# Patient Record
Sex: Male | Born: 1940 | Race: White | Hispanic: No | Marital: Married | Smoking: Former smoker
Health system: Southern US, Community
[De-identification: ages and names within clinical notes are randomized; demographics above are authoritative.]

## PROBLEM LIST (undated history)

## (undated) DIAGNOSIS — R911 Solitary pulmonary nodule: Secondary | ICD-10-CM

## (undated) DIAGNOSIS — E785 Hyperlipidemia, unspecified: Secondary | ICD-10-CM

## (undated) DIAGNOSIS — C801 Malignant (primary) neoplasm, unspecified: Secondary | ICD-10-CM

## (undated) DIAGNOSIS — I255 Ischemic cardiomyopathy: Secondary | ICD-10-CM

## (undated) DIAGNOSIS — I10 Essential (primary) hypertension: Secondary | ICD-10-CM

## (undated) DIAGNOSIS — D689 Coagulation defect, unspecified: Secondary | ICD-10-CM

## (undated) DIAGNOSIS — G709 Myoneural disorder, unspecified: Secondary | ICD-10-CM

## (undated) DIAGNOSIS — I2510 Atherosclerotic heart disease of native coronary artery without angina pectoris: Secondary | ICD-10-CM

## (undated) DIAGNOSIS — M3130 Wegener's granulomatosis without renal involvement: Secondary | ICD-10-CM

## (undated) HISTORY — DX: Coagulation defect, unspecified: D68.9

## (undated) HISTORY — DX: Essential (primary) hypertension: I10

## (undated) HISTORY — PX: BASAL CELL CARCINOMA EXCISION: SHX1214

## (undated) HISTORY — DX: Hyperlipidemia, unspecified: E78.5

## (undated) HISTORY — DX: Solitary pulmonary nodule: R91.1

## (undated) HISTORY — PX: POLYPECTOMY: SHX149

## (undated) HISTORY — DX: Wegener's granulomatosis without renal involvement: M31.30

## (undated) HISTORY — PX: LUNG SURGERY: SHX703

## (undated) HISTORY — DX: Ischemic cardiomyopathy: I25.5

## (undated) HISTORY — PX: COLONOSCOPY: SHX174

## (undated) HISTORY — DX: Atherosclerotic heart disease of native coronary artery without angina pectoris: I25.10

## (undated) HISTORY — DX: Malignant (primary) neoplasm, unspecified: C80.1

## (undated) HISTORY — PX: CARDIAC CATHETERIZATION: SHX172

## (undated) HISTORY — DX: Myoneural disorder, unspecified: G70.9

## (undated) HISTORY — DX: Morbid (severe) obesity due to excess calories: E66.01

---

## 1998-05-30 HISTORY — PX: CARDIAC SURGERY: SHX584

## 1998-09-22 HISTORY — PX: CORONARY ANGIOPLASTY: SHX604

## 2005-05-30 DIAGNOSIS — M3130 Wegener's granulomatosis without renal involvement: Secondary | ICD-10-CM

## 2005-05-30 HISTORY — DX: Wegener's granulomatosis without renal involvement: M31.30

## 2008-02-13 DIAGNOSIS — I10 Essential (primary) hypertension: Secondary | ICD-10-CM | POA: Insufficient documentation

## 2008-02-13 DIAGNOSIS — E7800 Pure hypercholesterolemia, unspecified: Secondary | ICD-10-CM | POA: Insufficient documentation

## 2010-08-30 ENCOUNTER — Ambulatory Visit (INDEPENDENT_AMBULATORY_CARE_PROVIDER_SITE_OTHER): Payer: Medicare Other | Admitting: Family Medicine

## 2010-08-30 ENCOUNTER — Encounter: Payer: Self-pay | Admitting: Family Medicine

## 2010-08-30 DIAGNOSIS — E11319 Type 2 diabetes mellitus with unspecified diabetic retinopathy without macular edema: Secondary | ICD-10-CM | POA: Insufficient documentation

## 2010-08-30 DIAGNOSIS — M3130 Wegener's granulomatosis without renal involvement: Secondary | ICD-10-CM | POA: Insufficient documentation

## 2010-08-30 DIAGNOSIS — E1142 Type 2 diabetes mellitus with diabetic polyneuropathy: Secondary | ICD-10-CM | POA: Insufficient documentation

## 2010-08-30 DIAGNOSIS — E785 Hyperlipidemia, unspecified: Secondary | ICD-10-CM

## 2010-08-30 DIAGNOSIS — I10 Essential (primary) hypertension: Secondary | ICD-10-CM

## 2010-08-30 DIAGNOSIS — E119 Type 2 diabetes mellitus without complications: Secondary | ICD-10-CM

## 2010-08-30 DIAGNOSIS — E1169 Type 2 diabetes mellitus with other specified complication: Secondary | ICD-10-CM | POA: Insufficient documentation

## 2010-08-30 MED ORDER — AMLODIPINE BESYLATE 10 MG PO TABS: 10.0000 mg | ORAL_TABLET | Freq: Every day | ORAL | Status: DC

## 2010-08-30 NOTE — Assessment & Plan Note (Signed)
Fairly controlled - pt will check to see if home cuff is calibrated and disc with cardiol tomorrow  Disc lifestyle Enc to keep loosing wt  Lab 6 wk F/u 8 wk

## 2010-08-30 NOTE — Assessment & Plan Note (Signed)
Is asymptomatic now  Primary symptoms - ETD when he gets it  Ref to pulm for long term care

## 2010-08-30 NOTE — Assessment & Plan Note (Signed)
Per pt is from prior prednisone and improving Also loosing wt Disc low glycemic diet utd opthy  Lab 6 wk and f/u

## 2010-08-30 NOTE — Progress Notes (Signed)
Subjective:    Patient ID: Ryan Morgan, male    DOB: 1940-07-27, 70 y.o.   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  Lipids - on meds -- has to change simvastatin because of amlodipine  Good chol a bit low at last check , total 140 , ? What the LDL was  Primary care does that check  Seeing cardiol tomorrow   Last heatth mt exam was 1 year ago   CAD- in 2000 -- one stent , other vessels clear -- doing well ever since   Hx of basal cell lesion --behind ear  Very early stage - also off nose  Is good about using sunscreen Sees Dr Orson Aloe at least yearly Wegoners- causes red bumps -- and uses mometasone cream  Itchy dry skin on legs - took fluocinonide   Temovate for insect bites prn   Lotrimin for jock itch   Nasal and opthy allergies More cough non prod lately   Extensive dental surgery lately --implants      Wegoner's granulomatosis  Was dx 1997 -- off prednisone for " a while" Symptoms include fluid in ear -- takes 10 mg prednisone prn  Needs to get est with pulm dr  Needs labs Non symptomatic  When symptomatic - ETD and also joint pain, fluid build up in lungs   Had lung bx in the past   Past Medical History  Diagnosis Date  . Diabetes mellitus   . Hypertension   . Heart disease   . Hyperlipidemia    Past Surgical History  Procedure Date  . Basal cell carcinoma excision     removed from face x  3  . 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 Cardiac surgery 2000    stent in heart    reports that he quit smoking about 32 years ago. He does not have any smokeless tobacco history on file. His alcohol and drug histories not on file. family history includes Cancer in his mother and Stroke in his maternal grandfather. Allergies  Allergen Reactions  . Niaspan (Niacin (Antihyperlipidemic)) Other (See Comments)    flushing  . Septra (Bactrim) Hives        Review of Systems  Constitutional: Negative for fever, appetite change, fatigue and unexpected weight change.  HENT: Negative for congestion and sore throat.   Eyes: Negative for pain and visual disturbance.  Respiratory: Negative for cough, shortness of breath and stridor.   Cardiovascular: Negative.   Gastrointestinal: Negative for nausea, vomiting and abdominal distention.  Genitourinary: Negative for urgency and frequency.  Musculoskeletal: Negative for myalgias and joint swelling.  Skin: Negative for color change, pallor and rash.  Neurological: Negative for tremors, weakness, light-headedness and headaches.  Hematological: Negative for adenopathy. Does not bruise/bleed easily.  Psychiatric/Behavioral: Negative  for dysphoric mood. The patient is not nervous/anxious.         Objective:   Physical Exam  Constitutional: He appears well-developed and well-nourished.       overwt and well appearing   HENT:  Head: Normocephalic and atraumatic.  Right Ear: External ear normal.  Left Ear: External ear normal.  Mouth/Throat: Oropharynx is clear and moist.  Eyes: Conjunctivae and EOM are normal. Pupils are equal, round, and reactive to light.  Neck: Normal range of motion. Neck supple. No JVD present. Carotid bruit is not present. No thyromegaly present.  Cardiovascular: Normal rate, regular rhythm and normal heart sounds.   Pulmonary/Chest: Effort normal and breath sounds normal. No respiratory  distress. He has no rales. He exhibits no tenderness.  Abdominal: Soft. Bowel sounds are normal. He exhibits no distension and no mass. There is no rebound.  Musculoskeletal: Normal range of motion. He exhibits no edema and no tenderness.  Lymphadenopathy:    He has no cervical adenopathy.  Neurological: He is alert. He has normal reflexes. Coordination normal.  Skin: Skin is warm and dry. No pallor.  Psychiatric: He has a normal mood and affect.          Assessment & Plan:

## 2010-08-30 NOTE — Patient Instructions (Signed)
We will do referral for pulmonary at check out - ask to see Ryan Morgan  Schedule fasting labs in 6 weeks - at that time let us know what cholesterol med you are on  Keep working on loosing weight loss  Follow up with me in 8 weeks

## 2010-08-30 NOTE — Assessment & Plan Note (Signed)
Off simvastain in light of amlodipine  Will get new statin from cardiol tomorrow Lab 6wk and f/u 8 wk Disc low sat fat diet

## 2010-09-15 ENCOUNTER — Ambulatory Visit (INDEPENDENT_AMBULATORY_CARE_PROVIDER_SITE_OTHER)
Admission: RE | Admit: 2010-09-15 | Discharge: 2010-09-15 | Disposition: A | Discharge status: Home / Self Care | Payer: Medicare Other | Source: Ambulatory Visit | Attending: Internal Medicine | Admitting: Internal Medicine

## 2010-09-15 ENCOUNTER — Ambulatory Visit (INDEPENDENT_AMBULATORY_CARE_PROVIDER_SITE_OTHER): Payer: Medicare Other | Admitting: Internal Medicine

## 2010-09-15 ENCOUNTER — Other Ambulatory Visit: Payer: Self-pay | Admitting: Internal Medicine

## 2010-09-15 ENCOUNTER — Other Ambulatory Visit: Payer: Medicare Other

## 2010-09-15 ENCOUNTER — Encounter: Payer: Self-pay | Admitting: Internal Medicine

## 2010-09-15 DIAGNOSIS — M3130 Wegener's granulomatosis without renal involvement: Secondary | ICD-10-CM

## 2010-09-15 DIAGNOSIS — I10 Essential (primary) hypertension: Secondary | ICD-10-CM

## 2010-09-15 MED ORDER — OLMESARTAN MEDOXOMIL-HCTZ 40-25 MG PO TABS: 1.0000 | ORAL_TABLET | Freq: Every day | ORAL | Status: DC

## 2010-09-15 MED ORDER — OLMESARTAN MEDOXOMIL-HCTZ 20-12.5 MG PO TABS: 1.0000 | ORAL_TABLET | Freq: Every day | ORAL | Status: DC

## 2010-09-15 NOTE — Patient Instructions (Addendum)
Return yearly unless problems - we will call you in meantime with lab/xray reports  Stop lisinopril for now  Start Benicar 40 /25   One daily  Late Add:  Need old cxr's from [place]/tickle file started

## 2010-09-15 NOTE — Progress Notes (Signed)
  Subjective:    Patient ID: Ryan Morgan, male    DOB: August 10, 1940, 70 y.o.   MRN: [id number]  HPI  69 yowm quit smoking around 1980 with no resp symptoms then WG in 1997 with joint pain, sinus dz, no rash or sign pulmonary complications  rx with ctx until around 1999 and none since referred to Pulmonary Clinic by Dr Milinda Antis for eval 09/15/2010    09/15/2010 Initial pulmonary office eval cc dry cough x 3 days, nothing chronic at all. No aches, occ nasal and ear congestion but no epistaxis or significant sinusitis hx. Treats coughing and nasal ear and throat congestion with short courses of prednisone while on ACEI.    Pt presently denies any significant sore throat, dysphagia, itching, sneezing,    Purulent nasal secretions,  fever, chills, sweats, unintended wt loss, pleuritic or exertional cp, hempoptysis, orthopnea pnd or leg swelling.    Also denies any obvious fluctuation of symptoms with weather or environmental changes or other aggravating or alleviating factors.      Review of Systems  Constitutional: Negative for fever, chills, activity change, appetite change and unexpected weight change.  HENT: Negative for congestion, sore throat, rhinorrhea, sneezing, trouble swallowing, dental problem, voice change and postnasal drip.   Eyes: Negative for visual disturbance.  Respiratory: Negative for cough, choking and shortness of breath.   Cardiovascular: Negative for chest pain and leg swelling.  Gastrointestinal: Negative for nausea, vomiting and abdominal pain.  Genitourinary: Negative for difficulty urinating.  Musculoskeletal: Negative for arthralgias.  Skin: Negative for rash.  Psychiatric/Behavioral: Negative for behavioral problems and confusion.       Objective:   Physical Exam    pleasant amb wm nad wt 249 09/15/10  HEENT: nl dentition, turbinates, and orophanx. Nl external ear canals without cough reflex   NECK :  without JVD/Nodes/TM/ nl carotid upstrokes  bilaterally   LUNGS: no acc muscle use, clear to A and P bilaterally without cough on insp or exp maneuvers   CV:  RRR  no s3 or murmur or increase in P2, no edema   ABD:  soft and nontender with nl excursion in the supine position. No bruits or organomegaly, bowel sounds nl  MS:  warm without deformities, calf tenderness, cyanosis or clubbing  SKIN: warm and dry without lesions    NEURO:  alert, approp, no deficits    cxr 09/15/10 *RADIOLOGY REPORT*  Clinical Data: Wegener's granulomatosis  CHEST - 2 VIEW  Comparison: None  Findings: Heart size upper normal. Negative for heart failure.  Linear scarring left upper lobe. Just below this there is a  nodular density measuring approximately 12 mm. This may be a lung  nodule. Deformity of the adjacent rib may be due to old trauma.  No acute infiltrate or effusion.  IMPRESSION:  Left upper lobe nodule. This could be related to neoplasm or  Wegener's. CT chest with contrast is suggested for further  evaluation.   Assessment & Plan:

## 2010-09-18 NOTE — Assessment & Plan Note (Signed)

## 2010-09-18 NOTE — Assessment & Plan Note (Addendum)
No evidence of active systemic dz or hx to suggest significant pulmonary or renal involvment - uses prednisone prn for flares of upper airway symptoms which are non-specific and probably not wg because this is not really all that sensitive to prednisone.  Anca sent - needs baseline bmet also pending as are cxr's for comparison as has a LUL SPN that will need f/u (placed in tickle file)

## 2010-09-23 LAB — ANCA SCREEN W REFLEX TITER
Atypical p-ANCA Screen: NEGATIVE
c-ANCA Screen: POSITIVE — AB
p-ANCA Screen: NEGATIVE

## 2010-09-23 LAB — ANCA TITERS

## 2010-09-24 ENCOUNTER — Encounter: Payer: Self-pay | Admitting: Family Medicine

## 2010-09-24 NOTE — Progress Notes (Signed)
Quick Note:  Spoke with pt and advised that we need to see him back in 3 months with cxr in hand. Pt verbalized understanding. ______

## 2010-09-27 ENCOUNTER — Encounter: Payer: Self-pay | Admitting: Family Medicine

## 2010-10-08 ENCOUNTER — Encounter: Payer: Self-pay | Admitting: Internal Medicine

## 2010-10-11 ENCOUNTER — Other Ambulatory Visit (INDEPENDENT_AMBULATORY_CARE_PROVIDER_SITE_OTHER): Payer: Medicare Other | Admitting: Family Medicine

## 2010-10-11 DIAGNOSIS — E119 Type 2 diabetes mellitus without complications: Secondary | ICD-10-CM

## 2010-10-11 DIAGNOSIS — E785 Hyperlipidemia, unspecified: Secondary | ICD-10-CM

## 2010-10-11 DIAGNOSIS — I10 Essential (primary) hypertension: Secondary | ICD-10-CM

## 2010-10-11 LAB — ALT: ALT: 17 U/L (ref 0–53)

## 2010-10-11 LAB — LIPID PANEL
LDL Cholesterol: 123 mg/dL — ABNORMAL HIGH (ref 0–99)
Total CHOL/HDL Ratio: 5
Triglycerides: 163 mg/dL — ABNORMAL HIGH (ref 0.0–149.0)

## 2010-10-11 LAB — RENAL FUNCTION PANEL
BUN: 15 mg/dL (ref 6–23)
CO2: 27 mEq/L (ref 19–32)
Chloride: 103 mEq/L (ref 96–112)
GFR: 92.7 mL/min (ref >60.00)
Phosphorus: 3.1 mg/dL (ref 2.3–4.6)
Potassium: 4.4 mEq/L (ref 3.5–5.1)
Sodium: 137 mEq/L (ref 135–145)

## 2010-10-11 LAB — HEMOGLOBIN A1C: Hgb A1c MFr Bld: 7.1 % — ABNORMAL HIGH (ref 4.6–6.5)

## 2010-10-20 ENCOUNTER — Encounter: Payer: Self-pay | Admitting: Family Medicine

## 2010-10-20 ENCOUNTER — Ambulatory Visit (INDEPENDENT_AMBULATORY_CARE_PROVIDER_SITE_OTHER): Payer: Medicare Other | Admitting: Family Medicine

## 2010-10-20 DIAGNOSIS — E785 Hyperlipidemia, unspecified: Secondary | ICD-10-CM

## 2010-10-20 DIAGNOSIS — I10 Essential (primary) hypertension: Secondary | ICD-10-CM

## 2010-10-20 DIAGNOSIS — E119 Type 2 diabetes mellitus without complications: Secondary | ICD-10-CM

## 2010-10-20 NOTE — Progress Notes (Signed)
  Subjective:    Patient ID: Ryan Morgan, male    DOB: 03/04/1941, 70 y.o.   MRN: [id number]  HPI Here for f/u of lipids and HTN and D  HTN in good control 130/80 Warned to watch breathing (by pulm) with arb No changes  Lipids are fair with LDL 123 Lab Results  Component Value Date   CHOL 191 10/11/2010   Lab Results  Component Value Date   HDL 35.70* 10/11/2010   Lab Results  Component Value Date   LDLCALC 123* 10/11/2010   Lab Results  Component Value Date   TRIG 163.0* 10/11/2010   Lab Results  Component Value Date   CHOLHDL 5 10/11/2010   No results found for this basename: LDLDIRECT    Not on statin yet - waiting to see what his cardiologist thinks when they get this lab report   DM a1c is 7.1 On amaryl and glucophage  On prednisone on and off- but not now (for lung dz) Diet is not optimal currently Lost 39 lb total  Not exercise but very busy outside  Not following a DM diet  Some sugary items - tends to crave them  Review of Systems Review of Systems  Constitutional: Negative for fever, appetite change, fatigue and unexpected weight change. no excessive thirst Eyes: Negative for pain and visual disturbance.  Respiratory: Negative for cough and shortness of breath. (despite lung dz)  Cardiovascular: Negative.  for cp or sob Gastrointestinal: Negative for nausea, diarrhea and constipation.  Genitourinary: Negative for urgency and frequency.  Skin: Negative for pallor. or rash  Neurological: Negative for weakness, light-headedness, numbness and headaches.  Hematological: Negative for adenopathy. Does not bruise/bleed easily.  Psychiatric/Behavioral: Negative for dysphoric mood. The patient is not nervous/anxious.  at times sufferes from loss of motivation        Objective:   Physical Exam  Constitutional: He appears well-developed and well-nourished. No distress.       Well appearing  Centrally obese   HENT:  Head: Normocephalic and atraumatic.  Eyes:  Conjunctivae and EOM are normal. Pupils are equal, round, and reactive to light.  Neck: Normal range of motion. Neck supple. No JVD present. Carotid bruit is not present. No thyromegaly present.  Cardiovascular: Normal rate, regular rhythm, normal heart sounds and intact distal pulses.   Pulmonary/Chest: Effort normal and breath sounds normal. No respiratory distress. He has no wheezes.  Abdominal: Soft. Bowel sounds are normal. He exhibits no distension, no abdominal bruit and no mass. There is no tenderness.  Musculoskeletal: Normal range of motion. He exhibits no edema and no tenderness.  Lymphadenopathy:    He has no cervical adenopathy.  Neurological: He is alert. He has normal reflexes. No cranial nerve deficit. Coordination normal.  Skin: Skin is warm and dry. No rash noted. No erythema.  Psychiatric: He has a normal mood and affect.          Assessment & Plan:

## 2010-10-20 NOTE — Patient Instructions (Signed)
We will refer you to Diabetic teaching at check out  Watch diet  Start exercise 20 minutes per day  Blood pressure is ok  No change in medicines  Talk to your cardiologist about cholesterol  Schedule fasting lab and follow up n 3 months

## 2010-10-20 NOTE — Assessment & Plan Note (Signed)
Fair control No change in meds Exercise enc F/u 3 mo  Rev lab with pt

## 2010-10-20 NOTE — Assessment & Plan Note (Signed)
LDL in 120s Goal is under 161 Disc low sat fat diet  Faxed copy to cardiol  May need to try diff statin - will wait for the word Re check 3 mo and f/u

## 2010-10-20 NOTE — Progress Notes (Signed)
Quick Note:  Spoke with pt and notified of results per Dr. Wert. Pt verbalized understanding and denied any questions.  ______ 

## 2010-10-20 NOTE — Assessment & Plan Note (Signed)
a1c 7.1 Would like it below 7 Ref to DM teaching Pt has poor understanding of lifestyle and needs to loose wt a1c and f/u 3 mo

## 2010-10-21 ENCOUNTER — Telehealth: Payer: Self-pay

## 2010-10-21 NOTE — Telephone Encounter (Signed)
10/11/10 lab results faxed to [facility] Dr Georgeann Oppenheim [phone] as instructed.

## 2010-10-27 ENCOUNTER — Ambulatory Visit: Payer: Medicare Other | Admitting: Family Medicine

## 2010-12-23 ENCOUNTER — Encounter: Payer: Self-pay | Admitting: Internal Medicine

## 2010-12-23 ENCOUNTER — Ambulatory Visit (INDEPENDENT_AMBULATORY_CARE_PROVIDER_SITE_OTHER): Payer: Medicare Other | Admitting: Internal Medicine

## 2010-12-23 DIAGNOSIS — R05 Cough: Secondary | ICD-10-CM

## 2010-12-23 DIAGNOSIS — R059 Cough, unspecified: Secondary | ICD-10-CM

## 2010-12-23 DIAGNOSIS — M3130 Wegener's granulomatosis without renal involvement: Secondary | ICD-10-CM

## 2010-12-23 DIAGNOSIS — I10 Essential (primary) hypertension: Secondary | ICD-10-CM

## 2010-12-23 MED ORDER — MOMETASONE FUROATE 50 MCG/ACT NA SUSP: NASAL | Status: DC

## 2010-12-23 NOTE — Assessment & Plan Note (Addendum)
The most common causes of chronic cough in immunocompetent adults include the following: upper airway cough syndrome (UACS), previously referred to as postnasal drip syndrome (PNDS), which is caused by variety of rhinosinus conditions; (2) asthma; (3) GERD; (4) chronic bronchitis from cigarette smoking or other inhaled environmental irritants; (5) nonasthmatic eosinophilic bronchitis; and (6) bronchiectasis.   These conditions, singly or in combination, have accounted for up to 94% of the causes of chronic cough in prospective studies.   Other conditions have constituted no >6% of the causes in prospective studies These have included bronchogenic carcinoma, chronic interstitial pneumonia, sarcoidosis, left ventricular failure, ACEI-induced cough, and aspiration from a condition associated with pharyngeal dysfunction.  This is most c/w  Classic Upper airway cough syndrome, so named because it's frequently impossible to sort out how much is  CR/sinusitis with freq throat clearing (which can be related to primary GERD)   vs  causing  secondary (" extra esophageal")  GERD from wide swings in gastric pressure that occur with throat clearing, often  promoting self use of mint and menthol lozenges that reduce the lower esophageal sphincter tone and exacerbate the problem further in a cyclical fashion.   These are the same pts who not infrequently have failed to tolerate ace inhibitors,  dry powder inhalers or biphosphonates or report having reflux symptoms that don't respond to standard doses of PPI , and are easily confused as having aecopd or asthma flares,   First try off ace x 6 weeks while treating non-specific rhinitis which has improved with topical steroids in past (therefore not likely WG)

## 2010-12-23 NOTE — Patient Instructions (Addendum)
Stop lisinopril and replace it with benicar 40/12.5 daily until return  Use nasonex perfectly regularly at bedtime and take it twice daily if you flare.  I emphasized that nasal steroids (nasonex)  have no immediate benefit in terms of improving symptoms.  To help them reached the target tissue, the patient should use Afrin two puffs every 12 hours applied one min before using the nasal steroids.  Afrin should be stopped after no more than 5 days.  If the symptoms worsen, Afrin can be restarted after 5 days off of therapy to prevent rebound congestion from overuse of Afrin.  I also emphasized that in no way are nasal steroids a concern in terms of "addiction".    Please schedule a follow up office visit in 6 weeks, call sooner if needed   Late add Needs cxr, bmet, u/a esr and repeat canca on f/u for multiple points on the curve

## 2010-12-23 NOTE — Assessment & Plan Note (Addendum)
I had an extended discussion with the patient today lasting 15 to 20 minutes of a 25 minute visit on the following issues:  No evidence of dz activity,  F/u conservatively

## 2010-12-23 NOTE — Assessment & Plan Note (Signed)

## 2010-12-23 NOTE — Progress Notes (Signed)
  Subjective:    Patient ID: Ryan Morgan, male    DOB: 03-17-1941, 70 y.o.   MRN: [id number]  HPI  78 yowm quit smoking around 1980 with no resp symptoms then WG in 1997 with joint pain, sinus dz, no rash or sign pulmonary complications   rx with ctx  until around 1999  and none since referred to Pulmonary Clinic by Dr Milinda Antis for eval 09/15/2010.    09/15/2010 Initial pulmonary office eval cc dry cough x 3 days, nothing chronic at all. No aches, occ nasal and ear congestion but no epistaxis or significant sinusitis hx. Treats coughing and nasal ear and throat congestion with short courses of prednisone while on ACEI.   Rec: checked c-anca POS 1:160   Stop lisinopril for now  Start Benicar 40 /25   One daily > ran out and restarted ace    12/23/2010 ov/Ryan Morgan cc no prednisone needed since last ov but persistent cough.  nasonex has helped in past, worst at hs. No epistaxis, no arhralgias or rash.  No sob. Mucus is minimal mucoid  Pt denies any significant sore throat, dysphagia, itching, sneezing,    fever, chills, sweats, unintended wt loss, pleuritic or exertional cp, hempoptysis, orthopnea pnd or leg swelling.    Also denies any obvious fluctuation of symptoms with weather or environmental changes or other aggravating or alleviating factors.            Objective:   Physical Exam    pleasant amb wm nad wt 249 09/15/10 > 252 12/23/2010   HEENT: nl dentition  and orophanx. Nl external ear canals without cough reflex. Bilateral severe non-specific turbinate edema, no crusting or ulceration   NECK :  without JVD/Nodes/TM/ nl carotid upstrokes bilaterally   LUNGS: no acc muscle use, clear to A and P bilaterally without cough on insp or exp maneuvers   CV:  RRR  no s3 or murmur or increase in P2, no edema   ABD:  soft and nontender with nl excursion in the supine position. No bruits or organomegaly, bowel sounds nl  MS:  warm without deformities, calf tenderness, cyanosis or  clubbing       cxr 09/15/10 *RADIOLOGY REPORT*  Clinical Data: Wegener's granulomatosis  CHEST - 2 VIEW  Comparison: None  Findings: Heart size upper normal. Negative for heart failure.  Linear scarring left upper lobe. Just below this there is a  nodular density measuring approximately 12 mm. This may be a lung  nodule. Deformity of the adjacent rib may be due to old trauma.  No acute infiltrate or effusion.  IMPRESSION:  Left upper lobe nodule. This could be related to neoplasm or  Wegener's. CT chest with contrast is suggested for further  Evaluation. Compared to cxr from 03/2009 no changes in LUL scarring, the area of previous lung bx   Assessment & Plan:

## 2010-12-28 ENCOUNTER — Other Ambulatory Visit (INDEPENDENT_AMBULATORY_CARE_PROVIDER_SITE_OTHER): Payer: Medicare Other | Admitting: Family Medicine

## 2010-12-28 DIAGNOSIS — E785 Hyperlipidemia, unspecified: Secondary | ICD-10-CM

## 2010-12-28 DIAGNOSIS — I10 Essential (primary) hypertension: Secondary | ICD-10-CM

## 2010-12-28 DIAGNOSIS — E119 Type 2 diabetes mellitus without complications: Secondary | ICD-10-CM

## 2010-12-28 LAB — LIPID PANEL
Cholesterol: 114 mg/dL (ref 0–200)
Total CHOL/HDL Ratio: 3
Triglycerides: 152 mg/dL — ABNORMAL HIGH (ref 0.0–149.0)

## 2010-12-30 ENCOUNTER — Telehealth: Payer: Self-pay | Admitting: Internal Medicine

## 2010-12-30 MED ORDER — LOSARTAN POTASSIUM-HCTZ 100-25 MG PO TABS: 1.0000 | ORAL_TABLET | Freq: Every day | ORAL | Status: DC

## 2010-12-30 NOTE — Telephone Encounter (Signed)
Called and spoke with pt.  Informed him of MW's recs.  Pt was ok with this and rx sent to [facility] in [place], [place]

## 2010-12-30 NOTE — Telephone Encounter (Signed)
Per CDY. This is okay to wait for MW to address, just have the pt stay off of beincar for now. I spoke with pt and made him aware, and he feels comfortable with waiting for response. MW, pls advise thanks

## 2010-12-30 NOTE — Telephone Encounter (Signed)
Called and spoke with pt.  Pt states when he saw MW on 7/26 he was changed from Lisinopril to Benicar 40/12.5.  Pt states soon after taking the Benicar he noticed "knot" under the skin of the palms of his hands.  Pt states these knots were itchy and painful. Pt stopped taking the Benicar and sx improved but then restarted again approx 3 days ago and the knots re appeared.  Pt states his pharmacist informed him Benicar has Sulfa in it (which pt is allergic to)  Pt denies any facial/tongue swelling or increased sob.  Pt is requesting MW's recs.  MW out of office this pm.  Will forward message to doc of the day to address.

## 2010-12-30 NOTE — Telephone Encounter (Signed)
Allergies  Allergen Reactions  . Niaspan (Niacin (Antihyperlipidemic)) Other (See Comments)    flushing  . Septra (Bactrim) Hives

## 2010-12-30 NOTE — Telephone Encounter (Signed)
Was not aware of a cross reaction but needs to be placed in notes and changed to generic hyzaar 100/25 one daily

## 2011-01-10 ENCOUNTER — Encounter: Payer: Self-pay | Admitting: Family Medicine

## 2011-01-10 ENCOUNTER — Ambulatory Visit (INDEPENDENT_AMBULATORY_CARE_PROVIDER_SITE_OTHER): Payer: Medicare Other | Admitting: Family Medicine

## 2011-01-10 DIAGNOSIS — I10 Essential (primary) hypertension: Secondary | ICD-10-CM

## 2011-01-10 DIAGNOSIS — E785 Hyperlipidemia, unspecified: Secondary | ICD-10-CM

## 2011-01-10 DIAGNOSIS — E119 Type 2 diabetes mellitus without complications: Secondary | ICD-10-CM

## 2011-01-10 MED ORDER — GLIMEPIRIDE 4 MG PO TABS: 4.0000 mg | ORAL_TABLET | Freq: Every day | ORAL | Status: DC

## 2011-01-10 MED ORDER — METFORMIN HCL 1000 MG PO TABS: 1000.0000 mg | ORAL_TABLET | Freq: Two times a day (BID) | ORAL | Status: DC

## 2011-01-10 NOTE — Assessment & Plan Note (Signed)
This is stable 130/77 Just started hyzaar- doing ok so far  Disc low sodium choices and exercise

## 2011-01-10 NOTE — Patient Instructions (Signed)
Diabetes is improving - keep working on diet and exercise and loosing weight  Don't give up on diabetic teaching  Cholesterol is much improved  Blood pressure is good  Follow up in 6 months for check up (annual exam ) - with labs prior

## 2011-01-10 NOTE — Assessment & Plan Note (Signed)
Improved much with lipitor Is at goal  Enc exercise Rev lab with pt  Rev low sat fat diet  F/u 6 mo after labs

## 2011-01-10 NOTE — Progress Notes (Signed)
Subjective:    Patient ID: Ryan Morgan, male    DOB: Jan 20, 1941, 70 y.o.   MRN: [id number]  HPI Here for f/u of lipids and a1c and HTN   Wt is down another 6 lb Does not use a scale at home  Continues to loose weight  Can tell by his belt   Is eating healthy   Lipids are much improved on lipitor - LDL is 45 Lab Results  Component Value Date   CHOL 114 12/28/2010   CHOL 191 10/11/2010   Lab Results  Component Value Date   HDL 39.00* 12/28/2010   HDL 35.70* 10/11/2010   Lab Results  Component Value Date   LDLCALC 45 12/28/2010   LDLCALC 123* 10/11/2010   Lab Results  Component Value Date   TRIG 152.0* 12/28/2010   TRIG 163.0* 10/11/2010   Lab Results  Component Value Date   CHOLHDL 3 12/28/2010   CHOLHDL 5 10/11/2010   No results found for this basename: LDLDIRECT    Diet  - always stays away from fried foods and fatty things  Eats red meat very very rarely -- prefers chicken and fish   DM is improved with a1c of 6.9 Diet -- avoiding bread / also no more sweets or chocolate pie for the most part  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 Hypertension  . Hyperlipemia  . Diabetes mellitus, type 2  . Wegener's granulomatosis  . Cough   Past Medical History  Diagnosis Date  . Diabetes mellitus   . Hypertension   . Heart disease   . Hyperlipidemia    Past Surgical History  Procedure Date  . Basal cell carcinoma excision     removed from face x 3  . 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 Cardiac surgery 2000    stent in heart   History  Substance Use Topics  . Smoking status: Former Smoker -- 0.5 packs/day for 20 years    Types: Cigarettes    Quit date: 05/30/1978  . Smokeless tobacco: Never Used  .  Alcohol Use: No   Family History  Problem Relation Age of Onset  . Cancer Mother     tumor on tonsil  . Stroke Maternal Grandfather    Allergies  Allergen Reactions  . Benicar Hct (Olmesartan Medoxomil-Hctz)     Painful and itchy knots on palms of hands  . Niaspan (Niacin (Antihyperlipidemic)) Other (See Comments)    flushing  . Septra (Bactrim) Hives   Current Outpatient Prescriptions on File Prior to Visit  Medication Sig Dispense Refill  . amLODipine (NORVASC) 10 MG tablet Take 1 tablet (10 mg total) by mouth daily.  90 tablet  3  . aspirin 81 MG tablet Take 81 mg by mouth daily.        Marland Kitchen atorvastatin (LIPITOR) 40 MG tablet Take 1 tablet by mouth Every morning.      . clotrimazole (LOTRIMIN) 1 % cream Mix with and use with Elocon       . losartan-hydrochlorothiazide (HYZAAR) 100-25 MG per tablet Take 1 tablet by mouth daily.  30 tablet  1  . olopatadine (PATANOL) 0.1 % ophthalmic solution 1  drop 2 (two) times daily.        . clobetasol (TEMOVATE) 0.05 % cream As needed on insect bite areas       . fluocinonide (LIDEX) 0.05 % cream As needed on legs for rash areas       . predniSONE (DELTASONE) 10 MG tablet Take 10 mg by mouth daily. As needed.           Review of Systems Review of Systems  Constitutional: Negative for fever, appetite change, fatigue and unexpected weight change.  Eyes: Negative for pain and visual disturbance.  Respiratory: Negative for cough and shortness of breath.   Cardiovascular: Negative.  for cp or palpitations  Gastrointestinal: Negative for nausea, diarrhea and constipation.  Genitourinary: Negative for urgency and frequency.  Skin: Negative for pallor.or rash   Neurological: Negative for weakness, light-headedness, numbness and headaches.  Hematological: Negative for adenopathy. Does not bruise/bleed easily.  Psychiatric/Behavioral: Negative for dysphoric mood. The patient is not nervous/anxious.          Objective:   Physical Exam    Constitutional: He appears well-developed and well-nourished. No distress.       overwt and well appearing   HENT:  Head: Normocephalic and atraumatic.  Mouth/Throat: Oropharynx is clear and moist.  Eyes: Conjunctivae and EOM are normal. Pupils are equal, round, and reactive to light.  Neck: Normal range of motion. Neck supple. No JVD present. Carotid bruit is not present. No thyromegaly present.  Cardiovascular: Normal rate, regular rhythm and normal heart sounds.   No murmur heard. Pulmonary/Chest: Effort normal and breath sounds normal. No respiratory distress. He has no wheezes. He has no rales.  Abdominal: Soft. Bowel sounds are normal. He exhibits no distension, no abdominal bruit and no mass. There is no tenderness.  Musculoskeletal: Normal range of motion. He exhibits no edema and no tenderness.  Lymphadenopathy:    He has no cervical adenopathy.  Neurological: He is alert. He has normal reflexes. Coordination normal.  Skin: Skin is warm and dry. No rash noted. No erythema. No pallor.  Psychiatric: He has a normal mood and affect.          Assessment & Plan:

## 2011-01-10 NOTE — Assessment & Plan Note (Signed)
Improved with better diet and continued wt loss opthy up to date a1c now 6.9 Disc DM teaching-enc to try to schedule it again F/u 6 mo with labs prior

## 2011-02-03 ENCOUNTER — Other Ambulatory Visit (INDEPENDENT_AMBULATORY_CARE_PROVIDER_SITE_OTHER): Payer: Medicare Other

## 2011-02-03 ENCOUNTER — Encounter: Payer: Self-pay | Admitting: Internal Medicine

## 2011-02-03 ENCOUNTER — Ambulatory Visit (INDEPENDENT_AMBULATORY_CARE_PROVIDER_SITE_OTHER)
Admission: RE | Admit: 2011-02-03 | Discharge: 2011-02-03 | Disposition: A | Discharge status: Home / Self Care | Payer: Medicare Other | Source: Ambulatory Visit | Attending: Cardiology | Admitting: Cardiology

## 2011-02-03 ENCOUNTER — Ambulatory Visit (INDEPENDENT_AMBULATORY_CARE_PROVIDER_SITE_OTHER): Payer: Medicare Other | Admitting: Internal Medicine

## 2011-02-03 VITALS — BP 124/72 | HR 78 | Temp 98.0°F | Ht 71.0 in | Wt 249.0 lb

## 2011-02-03 DIAGNOSIS — R059 Cough, unspecified: Secondary | ICD-10-CM

## 2011-02-03 DIAGNOSIS — R05 Cough: Secondary | ICD-10-CM

## 2011-02-03 DIAGNOSIS — M3130 Wegener's granulomatosis without renal involvement: Secondary | ICD-10-CM

## 2011-02-03 DIAGNOSIS — I10 Essential (primary) hypertension: Secondary | ICD-10-CM

## 2011-02-03 LAB — SEDIMENTATION RATE: Sed Rate: 22 mm/hr (ref 0–22)

## 2011-02-03 LAB — URINALYSIS
Leukocytes, UA: NEGATIVE
Specific Gravity, Urine: >=1.03 (ref 1.000–1.030)
Urobilinogen, UA: 0.2 (ref 0.0–1.0)
pH: 5 (ref 5.0–8.0)

## 2011-02-03 LAB — CBC WITH DIFFERENTIAL/PLATELET
Basophils Absolute: 0 10*3/uL (ref 0.0–0.1)
Eosinophils Relative: 1.1 % (ref 0.0–5.0)
HCT: 38.4 % — ABNORMAL LOW (ref 39.0–52.0)
Hemoglobin: 13.1 g/dL (ref 13.0–17.0)
Lymphocytes Relative: 30 % (ref 12.0–46.0)
Lymphs Abs: 1.4 10*3/uL (ref 0.7–4.0)
Monocytes Relative: 7.1 % (ref 3.0–12.0)
Neutro Abs: 3 10*3/uL (ref 1.4–7.7)
WBC: 4.8 10*3/uL (ref 4.5–10.5)

## 2011-02-03 LAB — BASIC METABOLIC PANEL
BUN: 15 mg/dL (ref 6–23)
Chloride: 103 mEq/L (ref 96–112)
GFR: 92.61 mL/min (ref >60.00)
Potassium: 4.1 mEq/L (ref 3.5–5.1)
Sodium: 141 mEq/L (ref 135–145)

## 2011-02-03 MED ORDER — AMLODIPINE BESYLATE-VALSARTAN 10-320 MG PO TABS: 1.0000 | ORAL_TABLET | Freq: Every day | ORAL | Status: DC

## 2011-02-03 NOTE — Progress Notes (Signed)
Subjective:    Patient ID: Ryan Morgan, male    DOB: 1941/01/03, 70 y.o.   MRN: [id number]  HPI  41 yowm quit smoking around 1980 with no resp symptoms then WG in 1997 with joint pain, sinus dz, no rash or sign pulmonary complications   rx with ctx  until around 1999 x around 2 years total duration  and none since then, referred to Pulmonary Clinic by Dr Milinda Antis for eval 09/15/2010.    09/15/2010 Initial pulmonary office eval cc dry cough x 3 days, nothing chronic at all. No aches, occ nasal and ear congestion but no epistaxis or significant sinusitis hx. Treats coughing and nasal ear and throat congestion with short courses of prednisone while on ACEI.   Rec: checked c-anca POS 1:160   Stop lisinopril for now  Start Benicar 40 /25   One daily > ran out and restarted ace    12/23/2010 ov/Kary Sugrue cc no prednisone needed since last ov but persistent cough.  nasonex has helped in past, worst at hs. No epistaxis, no arhralgias or rash.  No sob. Mucus is minimal mucoid rec Stop lisinopril and replace it with benicar 40/12.5 daily until return Use nasonex perfectly regularly at bedtime and take it twice daily if you flare. Late add Needs cxr, bmet, u/a esr and repeat c-anca on f/u for multiple points on the curve    02/03/2011 f/u ov/Keona Bilyeu cc cough daily esp when lie down at night > white mucus x decades, not really much better on prednisone.  Main c/o is itchy bumps on hands not resolved even though stopped benicar/hct and substituted losartan/hct.  Has not used prednisone since last ov.  No other rash/ arthralgias/ no sob.  Pt denies any significant sore throat, dysphagia, itching, sneezing,    fever, chills, sweats, unintended wt loss, pleuritic or exertional cp, hempoptysis, orthopnea pnd or leg swelling.    Also denies any obvious fluctuation of symptoms with weather or environmental changes or other aggravating or alleviating factors.            Objective:   Physical Exam    pleasant  amb wm nad does not typically answer questions in a straght forward manner   wt 249 09/15/10 > 252 12/23/2010  > 02/03/2011   HEENT: nl dentition  and orophanx. Nl external ear canals without cough reflex. Bilateral severe non-specific turbinate edema, no crusting or ulceration   NECK :  without JVD/Nodes/TM/ nl carotid upstrokes bilaterally   LUNGS: no acc muscle use, clear to A and P bilaterally without cough on insp or exp maneuvers   CV:  RRR  no s3 or murmur or increase in P2, no edema   ABD:  soft and nontender with nl excursion in the supine position. No bruits or organomegaly, bowel sounds nl  MS:  warm without deformities, calf tenderness, cyanosis or clubbing  Skin: no viz rash or palp nodules       cxr 09/15/10 *RADIOLOGY REPORT*  Clinical Data: Wegener's granulomatosis  CHEST - 2 VIEW  Comparison: None  Findings: Heart size upper normal. Negative for heart failure.  Linear scarring left upper lobe. Just below this there is a  nodular density measuring approximately 12 mm. This may be a lung  nodule. Deformity of the adjacent rib may be due to old trauma.  No acute infiltrate or effusion.  IMPRESSION:  Left upper lobe nodule. This could be related to neoplasm or  Wegener's. CT chest with contrast is suggested for further  Evaluation. Compared to cxr from 03/2009 no changes in LUL scarring, the area of previous lung bx   Assessment & Plan:

## 2011-02-03 NOTE — Patient Instructions (Addendum)
Please see patient coordinator before you leave today  to schedule sinus CT  Stop hyzar and amlodipine  Start Azor 10/160 mg one daily  Pepcid ac 20 mg one at bedtime  GERD (REFLUX)  is an extremely common cause of respiratory symptoms, many times with no significant heartburn at all.    It can be treated with medication, but also with lifestyle changes including avoidance of late meals, excessive alcohol, smoking cessation, and avoid fatty foods, chocolate, peppermint, colas, red wine, and acidic juices such as orange juice.  NO MINT OR MENTHOL PRODUCTS SO NO COUGH DROPS  USE SUGARLESS CANDY INSTEAD (jolley ranchers or Stover's)  NO OIL BASED VITAMINS - use powdered substitutes.    Please schedule a follow up office visit in 4 weeks, sooner if needed  NOTE DID NOT OBTAIN CXR AS REC DISCUSS AT NEXT OV

## 2011-02-04 ENCOUNTER — Encounter: Payer: Self-pay | Admitting: Internal Medicine

## 2011-02-04 NOTE — Progress Notes (Signed)
Quick Note:  Spoke with pt and notified of results per Dr. Wert. Pt verbalized understanding and denied any questions.  ______ 

## 2011-02-04 NOTE — Assessment & Plan Note (Addendum)
Now off ace for over a month still with hs cough with neg sinus dz though could still have rhinitis/pnds.  Try first rx with HS H2 and diet then regroup with ? Hs h1 next per guidelines for upper airway cough syndrome

## 2011-02-04 NOTE — Assessment & Plan Note (Signed)
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 Ok to follow every 6 months

## 2011-02-04 NOTE — Assessment & Plan Note (Addendum)
Prob intolerance to thiazides, not benicar, on basis of cross reactivity to sulfa drugs.  Still having hand itching on losartan/hct so change to amlodipine /diovan on a trial basis and f/u in 4 weeks

## 2011-03-03 ENCOUNTER — Encounter: Payer: Self-pay | Admitting: Internal Medicine

## 2011-03-03 ENCOUNTER — Ambulatory Visit (INDEPENDENT_AMBULATORY_CARE_PROVIDER_SITE_OTHER)
Admission: RE | Admit: 2011-03-03 | Discharge: 2011-03-03 | Disposition: A | Discharge status: Home / Self Care | Payer: Medicare Other | Source: Ambulatory Visit | Attending: Internal Medicine | Admitting: Internal Medicine

## 2011-03-03 ENCOUNTER — Ambulatory Visit (INDEPENDENT_AMBULATORY_CARE_PROVIDER_SITE_OTHER): Payer: Medicare Other | Admitting: Internal Medicine

## 2011-03-03 DIAGNOSIS — M3130 Wegener's granulomatosis without renal involvement: Secondary | ICD-10-CM

## 2011-03-03 DIAGNOSIS — R05 Cough: Secondary | ICD-10-CM

## 2011-03-03 DIAGNOSIS — R059 Cough, unspecified: Secondary | ICD-10-CM

## 2011-03-03 DIAGNOSIS — I10 Essential (primary) hypertension: Secondary | ICD-10-CM

## 2011-03-03 MED ORDER — NEBIVOLOL HCL 5 MG PO TABS: 5.0000 mg | ORAL_TABLET | Freq: Every day | ORAL | Status: DC

## 2011-03-03 MED ORDER — AMLODIPINE BESYLATE 10 MG PO TABS: 10.0000 mg | ORAL_TABLET | Freq: Every day | ORAL | Status: DC

## 2011-03-03 NOTE — Assessment & Plan Note (Signed)
Much better off ace and on max gerd rx with nl sinus ct  C/w Upper airway cough syndrome, so named because it's frequently impossible to sort out how much is  CR/pnds  with freq throat clearing (which can be related to primary GERD)   vs  causing  secondary (" extra esophageal")  GERD from wide swings in gastric pressure that occur with throat clearing, often  promoting self use of mint and menthol lozenges that reduce the lower esophageal sphincter tone and exacerbate the problem further in a cyclical fashion.   These are the same pts who not infrequently have failed to tolerate ace inhibitors,  dry powder inhalers or biphosphonates or report having reflux symptoms that don't respond to standard doses of PPI , and are easily confused as having aecopd or asthma flares,

## 2011-03-03 NOTE — Assessment & Plan Note (Signed)
cxr reviewed, No evidence dz activity, f/u q 6 months

## 2011-03-03 NOTE — Assessment & Plan Note (Signed)
Much better airway symptoms off ace but doesn't appear to be able to tol arb either due to itching so try : Bystolic, the most beta -1  selective Beta blocker available in sample form, with bisoprolol the most selective generic choice  on the market.

## 2011-03-03 NOTE — Progress Notes (Signed)
Subjective:    Patient ID: Ryan Morgan, male    DOB: 28-Mar-1941, 70 y.o.   MRN: [id number]  HPI  56 yowm quit smoking around 1980 with no resp symptoms then WG in 1997 with joint pain, sinus dz, no rash or sign pulmonary complications   rx with ctx  until around 1999 x around 2 years total duration  and none since then, referred to Pulmonary Clinic by Dr Milinda Antis for eval 09/15/2010.    09/15/2010 Initial pulmonary office eval cc dry cough x 3 days, nothing chronic at all. No aches, occ nasal and ear congestion but no epistaxis or significant sinusitis hx. Treats coughing and nasal ear and throat congestion with short courses of prednisone while on ACEI.   Rec: checked c-anca POS 1:160   Stop lisinopril for now  Start Benicar 40 /25   One daily > ran out and restarted ace    12/23/2010 ov/Ryan Morgan cc no prednisone needed since last ov but persistent cough.  nasonex has helped in past, worst at hs. No epistaxis, no arhralgias or rash.  No sob. Mucus is minimal mucoid rec Stop lisinopril and replace it with benicar 40/12.5 daily until return Use nasonex perfectly regularly at bedtime and take it twice daily if you flare. Late add Needs cxr, bmet, u/a esr and repeat c-anca on f/u for multiple points on the curve    02/03/2011 f/u ov/Ryan Morgan cc cough daily esp when lie down at night > white mucus x decades, not really much better on prednisone.  Main c/o is itchy bumps on hands not resolved even though stopped benicar/hct and substituted losartan/hct.  Has not used prednisone since last ov.  No other rash/ arthralgias/ no sob. 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  Pt denies any significant sore throat, dysphagia, itching, sneezing,    fever, chills, sweats, unintended wt loss, pleuritic or exertional cp, hempoptysis, orthopnea pnd or leg swelling.    Also denies any obvious fluctuation of symptoms with weather or environmental changes or other aggravating or alleviating factors.            Objective:   Physical Exam    pleasant amb wm nad does not typically answer questions in a straght forward manner, no longer throat clearing   wt 249 09/15/10 > 252 12/23/2010  > 249 03/03/2011   HEENT: nl dentition  and orophanx. Nl external ear canals without cough reflex. Bilateral severe non-specific turbinate edema, no crusting or ulceration   NECK :  without JVD/Nodes/TM/ nl carotid upstrokes bilaterally   LUNGS: no acc muscle use, clear to A and P bilaterally without cough on insp or exp maneuvers   CV:  RRR  no s3 or murmur or increase in P2, no edema   ABD:  soft and nontender with nl excursion in the supine position. No bruits or organomegaly, bowel sounds nl  MS:  warm without deformities, calf tenderness, cyanosis or clubbing  Skin: no viz rash or palp nodules       CXR  03/03/2011 :  Stable chest x-ray. Stable  nodules on the left with linear scarring in the left mid upper lung field.     Assessment & Plan:

## 2011-03-03 NOTE — Patient Instructions (Signed)
Be very careful with salt  Stop exforge and restart the amlopidine 10 mg one daily  Start bystolic 5 mg daily and if happy with the results see Dr Milinda Antis for refills  If hand itching continues you need to see your dermatologist but take all your medications with you  We will call you with your cxr report and see you in 6 months if doing well, sooner if needed

## 2011-03-14 ENCOUNTER — Telehealth: Payer: Self-pay | Admitting: Pulmonary Disease

## 2011-03-14 NOTE — Telephone Encounter (Signed)
lmomtcb  

## 2011-03-15 NOTE — Telephone Encounter (Signed)
Advised to increase bystolic to 5 mg bid and f/u with Dr Milinda Antis w/in 4 weeks

## 2011-03-15 NOTE — Telephone Encounter (Signed)
Patient returning call.

## 2011-03-15 NOTE — Telephone Encounter (Signed)
Pt denies headaches, changes in vision, or any other new symptoms. Is concerned of higher BP readings, stating that his previous BP readings have been the 130's/70's, wanted to make [place] aware of same. I advised pt typically we wait at least 2 weeks before making adjustments, however, if his numbers continue to go up and he does begin to have symptoms to call us.

## 2011-04-15 ENCOUNTER — Other Ambulatory Visit: Payer: Self-pay | Admitting: *Deleted

## 2011-04-15 MED ORDER — NEBIVOLOL HCL 5 MG PO TABS: 5.0000 mg | ORAL_TABLET | Freq: Two times a day (BID) | ORAL | Status: DC

## 2011-04-15 NOTE — Telephone Encounter (Signed)
I agree- he needs to stay off the ace (lisinopril) and continue the bystolic twice daily )-- last phone note indicated he is taking 5 mg bid  Will refill electronically F/u with me when able - I need to see what his pulse is as well before deciding to do next for the HTN

## 2011-04-15 NOTE — Telephone Encounter (Signed)
Pt was taken off lisinopril and put on bystolic by Dr.Wert.  His BP has been running around 160/90 for the last two weeks.  He was given samples and is now out and needs a new script called to [facility] [address].  Dr. Sherene Sires told him that if he was going to continue to treat him he would have to come off the lisinopril.  Please advise on what he should do, his BP was lower before he started this medicine.

## 2011-04-18 NOTE — Telephone Encounter (Signed)
Patient notified as instructed by telephone. Pt scheduled appt 05/16/11 at 9am and will call back if can come sooner.

## 2011-04-19 ENCOUNTER — Telehealth: Payer: Self-pay | Admitting: *Deleted

## 2011-04-19 NOTE — Telephone Encounter (Signed)
Prior Berkley Harvey is needed for bystolic, for bid dosing.  Form is on your shelf.

## 2011-04-19 NOTE — Telephone Encounter (Signed)
Form faxed to [employer].

## 2011-04-19 NOTE — Telephone Encounter (Signed)
Jacki Cones picked up form from in basket.

## 2011-04-19 NOTE — Telephone Encounter (Signed)
Form done and in IN box 

## 2011-04-22 MED ORDER — NEBIVOLOL HCL 10 MG PO TABS: 10.0000 mg | ORAL_TABLET | Freq: Every day | ORAL | Status: DC

## 2011-04-22 NOTE — Telephone Encounter (Signed)
We still havent heard from [employer] regarding prior auth on bystolic.  They are closed today.  Pt hasnt had this in a week and he is getting ready to go out of town untill 12/20.  Can something else be called in for him to [facility] [address]?

## 2011-04-22 NOTE — Telephone Encounter (Signed)
Prior auth for bystolic denied, letter is on your shelf, I saw this after I wrote you the note below.

## 2011-04-22 NOTE — Telephone Encounter (Signed)
Advised pt, he will call if further problems with prescription.

## 2011-04-22 NOTE — Telephone Encounter (Signed)
Sounds like pharmacy wants him to have 10 mg once daily instead of 5 mg bid  Will send elect to [facility] on [address]  Let me know if any problems

## 2011-05-16 ENCOUNTER — Encounter: Payer: Self-pay | Admitting: Family Medicine

## 2011-05-16 ENCOUNTER — Ambulatory Visit (INDEPENDENT_AMBULATORY_CARE_PROVIDER_SITE_OTHER): Payer: Medicare Other | Admitting: Family Medicine

## 2011-05-16 ENCOUNTER — Telehealth: Payer: Self-pay | Admitting: Internal Medicine

## 2011-05-16 VITALS — BP 178/88 | HR 64 | Temp 98.8°F | Ht 71.0 in | Wt 253.8 lb

## 2011-05-16 DIAGNOSIS — M3130 Wegener's granulomatosis without renal involvement: Secondary | ICD-10-CM

## 2011-05-16 DIAGNOSIS — Z23 Encounter for immunization: Secondary | ICD-10-CM

## 2011-05-16 DIAGNOSIS — I10 Essential (primary) hypertension: Secondary | ICD-10-CM

## 2011-05-16 DIAGNOSIS — E119 Type 2 diabetes mellitus without complications: Secondary | ICD-10-CM

## 2011-05-16 MED ORDER — OLMESARTAN MEDOXOMIL 40 MG PO TABS: 40.0000 mg | ORAL_TABLET | Freq: Every day | ORAL | Status: DC

## 2011-05-16 NOTE — Assessment & Plan Note (Signed)
bp high On bystolic alone ? Which med caused rxn  Will re start benicar w/o hctz F/u after holidays Urged better health habits Made plan for exercise

## 2011-05-16 NOTE — Patient Instructions (Signed)
We will do referral to pulmonary in [place] at check out  Please check your sugar twice daily  I am going to add back the benicar without the diuretic (no hctz )  If rash returns immediately stop med and call me  Follow up with me after holidays with blood sugar journal  Try hard to get 30 minutes of exercise per day and work on weight loss

## 2011-05-16 NOTE — Assessment & Plan Note (Signed)
Pt interested in seeing new pulmonologist- Dr Mayo Ao in [facility]  Made ref Has been doing well  Dermatologist does not think wegener's is causing rash

## 2011-05-16 NOTE — Telephone Encounter (Signed)
Ok with me and wish him well

## 2011-05-16 NOTE — Telephone Encounter (Signed)
Pt is requesting to switch to Dr. Kendrick Fries because this is much closer to his home. Please advise Dr. Sherene Sires.Carron Curie, CMA

## 2011-05-16 NOTE — Assessment & Plan Note (Signed)
Off prednisone and off amaryl and metformin right now due to drug rash of ? etiol Will work on bp  Then re- introduce meds until we see what he is allergic to  Will try benicar again  Also check sugar bid f/u after holidays to rev Rev low glycemic diet and need for wt loss

## 2011-05-16 NOTE — Progress Notes (Signed)
Subjective:    Patient ID: Ryan Morgan, male    DOB: 01/30/1941, 70 y.o.   MRN: [id number]  HPI Here for f/u of HTN and DM  Has had splotches all over hands - for months  Stopped a bunch of meds at once -- and the hand rash went away- ? Which one it was  Dermatologist stopped chol med  Said rash was more likely med rxn than wegners  Last things he stopped were metformin and amaryl and amlodipine   bp is   178/88  Today Not well controlled  No cp or palpitations or headaches or edema  No side effects to medicines   Was on amlodipine and also bystolic 10 Stopped the amlodipine due to rash  Wt is up 4 lb with bmi of 35  Prednisone status-not on any for 3 days now  Need new pulm - interested in seeing Dr Mayo Ao  Will stop the allegra   Cannot take benicar hct  Pharmacist wondered if hct caused it    DM- not on any medicines now  Need to start testing sugar - I recommend twice daily Is eating a DM diet -- does not know why he gained  Is working with [employer] -- and no other plan  Due for a1c opthy- up to date   Patient Active Problem List  Diagnoses  . Hypertension  . Hyperlipemia  . Diabetes mellitus, type 2  . Wegener's granulomatosis  . Cough   Past Medical History  Diagnosis Date  . Diabetes mellitus   . Hypertension   . Heart disease   . Hyperlipidemia    Past Surgical History  Procedure Date  . Basal cell carcinoma excision     removed from face x 3  . 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 Cardiac surgery 2000    stent in heart   History  Substance Use Topics  . Smoking status: Former Smoker -- 0.5 packs/day for 20 years    Types: Cigarettes    Quit date: 05/30/1978  . Smokeless tobacco: Never Used  . Alcohol Use: No   Family History  Problem Relation Age of Onset  . Cancer Mother     tumor on tonsil  . Stroke Maternal Grandfather    Allergies  Allergen Reactions  . Benicar Hct (Olmesartan Medoxomil-Hctz)    Painful and itchy knots on palms of hands  . Niaspan (Niacin (Antihyperlipidemic)) Other (See Comments)    flushing  . Septra (Bactrim) Hives   Current Outpatient Prescriptions on File Prior to Visit  Medication Sig Dispense Refill  . mometasone (NASONEX) 50 MCG/ACT nasal spray Place 1 spray into the nose 2 (two) times daily.        Marland Kitchen olopatadine (PATANOL) 0.1 % ophthalmic solution Apply 1 drop to eye 2 (two) times daily.        No current facility-administered medications on file prior to visit.      Review of Systems Review of Systems  Constitutional: Negative for fever, appetite change, fatigue and unexpected weight change.  Eyes: Negative for pain and visual disturbance.  Respiratory: Negative for cough and shortness of breath.   Cardiovascular: Negative for cp or palpitations    Gastrointestinal: Negative for nausea, diarrhea and constipation.  Genitourinary: Negative for urgency and frequency.  Skin: Negative for pallor or rash  (no itching presently) Neurological: Negative for weakness, light-headedness, numbness and headaches.  Hematological: Negative for adenopathy. Does not bruise/bleed easily.  Psychiatric/Behavioral:  Negative for dysphoric mood. The patient is not nervous/anxious.          Objective:   Physical Exam  Constitutional: He appears well-developed and well-nourished. No distress.       Obese and well appearing   HENT:  Head: Normocephalic and atraumatic.  Mouth/Throat: Oropharynx is clear and moist.  Eyes: Conjunctivae and EOM are normal. Pupils are equal, round, and reactive to light. No scleral icterus.  Neck: Normal range of motion. Neck supple. No JVD present. Carotid bruit is not present. No thyromegaly present.  Cardiovascular: Normal rate, regular rhythm, normal heart sounds and intact distal pulses.  Exam reveals no gallop.   Pulmonary/Chest: Effort normal and breath sounds normal. No respiratory distress. He has no wheezes.       Diffusely  harsh bs   Abdominal: Soft. Bowel sounds are normal. He exhibits no distension and no abdominal bruit. There is no tenderness.  Musculoskeletal: He exhibits no edema and no tenderness.  Lymphadenopathy:    He has no cervical adenopathy.  Neurological: He is alert. He has normal reflexes. He exhibits normal muscle tone. Coordination normal.  Skin: Skin is warm and dry. No rash noted. No erythema. No pallor.       No rash   Psychiatric: He has a normal mood and affect.          Assessment & Plan:

## 2011-05-16 NOTE — Telephone Encounter (Signed)
Dr. Kendrick Fries please advise if you are okay with the switch. Thanks

## 2011-05-18 ENCOUNTER — Emergency Department (HOSPITAL_COMMUNITY)
Admission: EM | Admit: 2011-05-18 | Discharge: 2011-05-19 | Disposition: A | Discharge status: Home / Self Care | Payer: Medicare Other | Attending: Emergency Medicine | Admitting: Emergency Medicine

## 2011-05-18 ENCOUNTER — Encounter (HOSPITAL_COMMUNITY): Payer: Self-pay | Admitting: *Deleted

## 2011-05-18 DIAGNOSIS — W1809XA Striking against other object with subsequent fall, initial encounter: Secondary | ICD-10-CM | POA: Insufficient documentation

## 2011-05-18 DIAGNOSIS — E119 Type 2 diabetes mellitus without complications: Secondary | ICD-10-CM | POA: Insufficient documentation

## 2011-05-18 DIAGNOSIS — S39012A Strain of muscle, fascia and tendon of lower back, initial encounter: Secondary | ICD-10-CM

## 2011-05-18 DIAGNOSIS — R109 Unspecified abdominal pain: Secondary | ICD-10-CM | POA: Insufficient documentation

## 2011-05-18 DIAGNOSIS — R079 Chest pain, unspecified: Secondary | ICD-10-CM | POA: Insufficient documentation

## 2011-05-18 DIAGNOSIS — S335XXA Sprain of ligaments of lumbar spine, initial encounter: Secondary | ICD-10-CM | POA: Insufficient documentation

## 2011-05-18 DIAGNOSIS — R51 Headache: Secondary | ICD-10-CM | POA: Insufficient documentation

## 2011-05-18 DIAGNOSIS — W19XXXA Unspecified fall, initial encounter: Secondary | ICD-10-CM

## 2011-05-18 DIAGNOSIS — S0990XA Unspecified injury of head, initial encounter: Secondary | ICD-10-CM | POA: Insufficient documentation

## 2011-05-18 DIAGNOSIS — E785 Hyperlipidemia, unspecified: Secondary | ICD-10-CM | POA: Insufficient documentation

## 2011-05-18 DIAGNOSIS — I10 Essential (primary) hypertension: Secondary | ICD-10-CM | POA: Insufficient documentation

## 2011-05-18 DIAGNOSIS — S20219A Contusion of unspecified front wall of thorax, initial encounter: Secondary | ICD-10-CM

## 2011-05-18 NOTE — Telephone Encounter (Signed)
lmomtcb for pt 

## 2011-05-18 NOTE — ED Notes (Signed)
Here for fall, fell at ~ 1930, denies dizziness, "just stepped wrong", fell onto asphault, hit head first, abrasion & swelling noted to L forehead, also c/o L axillary pain, L mid back pain, L medial hand pain (denies: LOC, neck pain, knee pain, hip pain or other pains/injuries, dizziness, visual changes or vomiting). Took 2 vicodin PTA, "minimal improvement", denies ETOH. Alert, NAd, calm, interactive.

## 2011-05-18 NOTE — Telephone Encounter (Signed)
OK with me.

## 2011-05-19 ENCOUNTER — Emergency Department (HOSPITAL_COMMUNITY): Payer: Medicare Other

## 2011-05-19 ENCOUNTER — Encounter (HOSPITAL_COMMUNITY): Payer: Self-pay | Admitting: Radiology

## 2011-05-19 MED ORDER — OXYCODONE-ACETAMINOPHEN 5-325 MG PO TABS: 1.0000 | ORAL_TABLET | Freq: Four times a day (QID) | ORAL | Status: AC | PRN

## 2011-05-19 MED ORDER — DOCUSATE SODIUM 100 MG PO CAPS: 100.0000 mg | ORAL_CAPSULE | Freq: Two times a day (BID) | ORAL | Status: AC

## 2011-05-19 MED ORDER — OXYCODONE-ACETAMINOPHEN 5-325 MG PO TABS
1.0000 | ORAL_TABLET | Freq: Once | ORAL | Status: AC
  Administered 2011-05-19: 1 via ORAL
  Filled 2011-05-19: qty 1

## 2011-05-19 NOTE — Telephone Encounter (Signed)
Pt returning call can be reached at [phone].Ryan Morgan

## 2011-05-19 NOTE — Telephone Encounter (Signed)
Pt already has an apt made. Pt needed nothing further

## 2011-05-19 NOTE — ED Provider Notes (Signed)
History     CSN: [id number] Arrival date & time: 05/18/2011 11:44 PM   First MD Initiated Contact with Patient 05/19/11 0036      Chief Complaint  Patient presents with  . Fall   HPI Patient presents to the emergency room after a fall this evening. Patient states he was walking from a walk way to the asphalt and there was a large prescription that she didn't notice. Patient fell forward hitting his left head his left chest in his hand. Patient now has mild pain in his head as well as pain in his lower pelvic region posteriorly on the left side. He also has some pain in his left rib. He sustained a mild abrasion on his left hand but that is not really bothering him. Patient denies any loss of consciousness. There has been no vomiting diarrhea or weakness. Past Medical History  Diagnosis Date  . Diabetes mellitus   . Hypertension   . Heart disease   . Hyperlipidemia    Past Surgical History  Procedure Date  . Basal cell carcinoma excision     removed from face x 3  . 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 Cardiac surgery 2000    stent in heart   Family History  Problem Relation Age of Onset  . Cancer Mother     tumor on tonsil  . Stroke Maternal Grandfather    History  Substance Use Topics  . Smoking status: Former Smoker -- 0.5 packs/day for 20 years    Types: Cigarettes    Quit date: 05/30/1978  . Smokeless tobacco: Never Used  . Alcohol Use: No    Review of Systems  All other systems reviewed and are negative.    Allergies  Benicar hct; Niaspan; and Septra  Home Medications   Current Outpatient Rx  Name Route Sig Dispense Refill  . FEXOFENADINE HCL 180 MG PO TABS Oral Take 180 mg by mouth daily.      Marland Kitchen GLIMEPIRIDE 4 MG PO TABS Oral Take 1 tablet by mouth Daily.    Marland Kitchen METFORMIN HCL 1000 MG PO TABS Oral Take 1 tablet by mouth Twice daily.    . MOMETASONE FUROATE 50 MCG/ACT NA SUSP Nasal Place 1 spray into the nose 2 (two) times daily.       . NEBIVOLOL HCL 10 MG PO TABS Oral Take 10 mg by mouth daily.      Marland Kitchen OLMESARTAN MEDOXOMIL 40 MG PO TABS Oral Take 40 mg by mouth daily.      . OLOPATADINE HCL 0.1 % OP SOLN Ophthalmic Apply 1 drop to eye 2 (two) times daily.     Marland Kitchen PREDNISONE 10 MG PO TABS Oral Take 1 tablet by mouth Daily.      BP 184/79  Pulse 56  Temp(Src) 99 F (37.2 C) (Oral)  Resp 20  SpO2 96%  Physical Exam  Nursing note and vitals reviewed. Constitutional: He appears well-developed and well-nourished. No distress.  HENT:  Head: Normocephalic.  Right Ear: External ear normal.  Left Ear: External ear normal.       Abrasion and contusion left for head  Eyes: Conjunctivae are normal. Right eye exhibits no discharge. Left eye exhibits no discharge. No scleral icterus.  Neck: Neck supple. No tracheal deviation present.  Cardiovascular: Normal rate, regular rhythm and intact distal pulses.   Pulmonary/Chest: Effort normal and breath sounds normal. No stridor. No respiratory distress. He has no wheezes. He has  no rales. He exhibits tenderness (mild left sided tenderness, no crepitus).  Abdominal: Soft. Bowel sounds are normal. He exhibits no distension. There is no tenderness. There is no rebound and no guarding.  Musculoskeletal: He exhibits no edema and no tenderness.       Cervical back: Normal.       Thoracic back: Normal.       Lumbar back: Normal.       Back:       Hands:      Mild tenderness posterior left iliac crest  Neurological: He is alert. He has normal strength. No sensory deficit. Cranial nerve deficit:  no gross defecits noted. He exhibits normal muscle tone. He displays no seizure activity. Coordination normal.  Skin: Skin is warm and dry. No rash noted.  Psychiatric: He has a normal mood and affect.    ED Course  Procedures (including critical care time)  Labs Reviewed - No data to display Dg Ribs Unilateral W/chest Left  05/19/2011  *RADIOLOGY REPORT*  Clinical Data: Left posterior  rib pain status post fall.  LEFT RIBS AND CHEST - 3+ VIEW  Comparison: 03/03/2011 radiograph  Findings: Linear opacity within the periphery of the left lung is similar to prior likely scarring.  Heart and mediastinal contours are unchanged allowing for differences in technique.  No pleural effusion or pneumothorax.  No displaced fracture identified.  IMPRESSION:  Bilateral interstitial prominence, can be seen with viral/atypical infection or edema.  No displaced fracture identified.  Original Report Authenticated By: Waneta Martins, M.D.   Dg Pelvis 1-2 Views  05/19/2011  *RADIOLOGY REPORT*  Clinical Data: Left-sided hip pain status post fall.  PELVIS - 1-2 VIEW  Comparison: None.  Findings: No displaced fracture or dislocation identified on this single view.  Mild bilateral hip DJD.  Sacroiliac joints appear intact.  Sacrum is obscured by overlying bowel.  IMPRESSION: No displaced fracture identified.  Recommend dedicated left hip radiographs if concern for fracture persists.  Original Report Authenticated By: Waneta Martins, M.D.   Ct Head Wo Contrast  05/19/2011  *RADIOLOGY REPORT*  Clinical Data: Status post fall, abrasion to right forehead  CT HEAD WITHOUT CONTRAST  Technique:  Contiguous axial images were obtained from the base of the skull through the vertex without contrast.  Comparison: CT sinus dated 02/03/2011  Findings: No evidence of parenchymal hemorrhage or extra-axial fluid collection. No mass lesion, mass effect, or midline shift.  No CT evidence of acute infarction.  Subcortical white matter and periventricular small vessel ischemic changes.  Intracranial atherosclerosis.  Cerebral volume is age appropriate.  No ventriculomegaly.  The visualized paranasal sinuses are essentially clear. The mastoid air cells are unopacified.  No evidence of calvarial fracture.  IMPRESSION: No evidence of acute intracranial abnormality.  Small vessel ischemic changes with intracranial atherosclerosis.   Original Report Authenticated By: Charline Bills, M.D.     1. Fall   2. Minor head injury   3. Rib contusion   4. Lumbar strain       MDM  Patient had essentially a ground level fall. He stepped off a curb and fell to the ground. X-rays do not show any signs of fracture. Patient does not actually have any pain in his hip as the pain is more posterior. He has full range of his left hip without any discomfort. X-ray does not show any signs of fracture but I did explain to the patient he certainly could have an occult fracture. At this point there  does not appear to be any evidence of an acute emergency medical condition. Patient will be given a prescription for Percocet for his pain and discomfort.        Celene Kras, MD 05/19/11 [phone]

## 2011-05-19 NOTE — Telephone Encounter (Signed)
LMTCB

## 2011-05-21 ENCOUNTER — Encounter: Payer: Self-pay | Admitting: Family Medicine

## 2011-05-21 ENCOUNTER — Ambulatory Visit (INDEPENDENT_AMBULATORY_CARE_PROVIDER_SITE_OTHER): Payer: Medicare Other | Admitting: Family Medicine

## 2011-05-21 VITALS — BP 160/100 | Temp 98.6°F | Wt 254.0 lb

## 2011-05-21 DIAGNOSIS — R079 Chest pain, unspecified: Secondary | ICD-10-CM

## 2011-05-21 DIAGNOSIS — M79602 Pain in left arm: Secondary | ICD-10-CM

## 2011-05-21 DIAGNOSIS — M79609 Pain in unspecified limb: Secondary | ICD-10-CM

## 2011-05-21 DIAGNOSIS — M25519 Pain in unspecified shoulder: Secondary | ICD-10-CM

## 2011-05-21 DIAGNOSIS — R109 Unspecified abdominal pain: Secondary | ICD-10-CM

## 2011-05-21 DIAGNOSIS — M549 Dorsalgia, unspecified: Secondary | ICD-10-CM

## 2011-05-21 NOTE — Patient Instructions (Signed)
Concern for ? Proximal humerus fracture Potentially could be rtc tear, but please eval and rule out for proximal humerus fracture after fall

## 2011-05-21 NOTE — Progress Notes (Signed)
Patient Name: Ryan Morgan Date of Birth: Sep 16, 1940 Age: 70 y.o. Medical Record Number: [id number] Gender: male Date of Encounter: 05/21/2011  History of Present Illness:  Ryan Morgan is a 70 y.o. very pleasant male patient who presents with the following:  Fall on Wed night, [place], Xr and CT scan of head are normal Wed fell at the knee, hurts in his lower back and in his shoulder blade and on his face.   Pain with moving left arm -- at the central chest.   At this point the patient is primarily complaining about severe pain in his LEFT upper extremity. He is having severe limiting pain with any attempt to abduct her move the LEFT extremity. He is having pain in and around his chest, and pain in his upper arm with motion of the arm only. No pain with taking a deep breath. No pain with coughing.  The shortness of breath. No substernal chest pain.  Prior x-rays were all reviewed in the office with the patient. There is no full view of the patient's uterus. He has had a rib series, pelvic series, and a CT of the head. No occult fractures.  Past Medical History, Surgical History, Social History, Family History, Problem List, Medications, and Allergies have been reviewed and updated if relevant.  Review of Systems:  GEN: No fevers, chills. Nontoxic. Primarily MSK c/o today. MSK: Detailed in the HPI GI: tolerating PO intake without difficulty Neuro: No numbness, parasthesias, or tingling associated. Otherwise the pertinent positives of the ROS are noted above.    Physical Examination: Filed Vitals:   05/21/11 1111  BP: 160/100  Temp: 98.6 F (37 C)  TempSrc: Oral  Weight: 254 lb (115.214 kg)    There is no height on file to calculate BMI.   GEN: WDWN, NAD, Non-toxic, Alert & Oriented x 3 HEENT: Atraumatic, Normocephalic.  Ears and Nose: No external deformity. EXTR: No clubbing/cyanosis/edema NEURO: Normal gait.  PSYCH: Normally interactive. Conversant. Not depressed or  anxious appearing.  Calm demeanor.   Abdomen: Soft, nondistended, positive bowel sounds. Nontender. No hepatosplenomegaly. No flank pain.  RIGHT shoulder: Full range of motion with strength testing 5/5 throughout.  LEFT upper extremity: Nontender throughout the hand and wrist. Nontender along the radius and ulna. Full range of motion at the elbow. Markedly tender with deep palpation squeeze at the upper humerus. The patient is unable to abduct or flex at the LEFT shoulder.  Objective Data:  PELVIS - 1-2 VIEW   Comparison: None.   Findings: No displaced fracture or dislocation identified on this single view.  Mild bilateral hip DJD.  Sacroiliac joints appear intact.  Sacrum is obscured by overlying bowel.   IMPRESSION: No displaced fracture identified.  Recommend dedicated left hip radiographs if concern for fracture persists.   Original Report Authenticated By: Waneta Martins, M.  LEFT RIBS AND CHEST - 3+ VIEW   Comparison: 03/03/2011 radiograph   Findings: Linear opacity within the periphery of the left lung is similar to prior likely scarring.  Heart and mediastinal contours are unchanged allowing for differences in technique.  No pleural effusion or pneumothorax.   No displaced fracture identified.   IMPRESSION:   Bilateral interstitial prominence, can be seen with viral/atypical infection or edema.   No displaced fracture identified.   Original Report Authenticated By: Waneta Martins, M.D.      CT HEAD WITHOUT CONTRAST   Technique:  Contiguous axial images were obtained from the base of the skull through  the vertex without contrast.   Comparison: CT sinus dated 02/03/2011   Findings: No evidence of parenchymal hemorrhage or extra-axial fluid collection. No mass lesion, mass effect, or midline shift.   No CT evidence of acute infarction.   Subcortical white matter and periventricular small vessel ischemic changes.  Intracranial atherosclerosis.     Cerebral volume is age appropriate.  No ventriculomegaly.   The visualized paranasal sinuses are essentially clear. The mastoid air cells are unopacified.   No evidence of calvarial fracture.   IMPRESSION: No evidence of acute intracranial abnormality.   Small vessel ischemic changes with intracranial atherosclerosis.   Original Report Authenticated By: Charline Bills, M.D.   Assessment and Plan: 1. Shoulder pain   2. Arm pain, left   3. Chest pain   4. Back pain   5. Abdominal  pain, other specified site     >40 minutes spent in face to face time with patient, >50% spent in counselling or coordination of care: all the patient's records have been reviewed from his recent care, ER records reviewed as well as radiological reports and imaging reviewed with the patient directly face-to-face. I have a high level of concern that the patient has sustained an acute injury to his LEFT shoulder, and he needs to be evaluated for an occult proximal humerus fracture.  He is going to go to the [facility] orthopedics Saturday clinic for evaluation. I tried multiple times to call and speak to the physician on call for this office, but could not get through. I explained to the patient that I think that he needed to have this evaluated to see if he did have a occult fracture, and assess  Its stability. He could also potentially have a  Rotator cuff tear, which would potentially have a similar presentation.  I reassured him about his other complaints and pains he is having around his back and abdomen, and his examination is grossly reassuring in these areas.

## 2011-05-23 ENCOUNTER — Ambulatory Visit (HOSPITAL_COMMUNITY): Admission: RE | Admit: 2011-05-23 | Payer: Medicare Other | Source: Ambulatory Visit

## 2011-05-23 ENCOUNTER — Ambulatory Visit (HOSPITAL_COMMUNITY)
Admission: RE | Admit: 2011-05-23 | Discharge: 2011-05-23 | Disposition: A | Discharge status: Home / Self Care | Payer: Medicare Other | Source: Ambulatory Visit | Attending: Family Medicine | Admitting: Family Medicine

## 2011-05-23 ENCOUNTER — Other Ambulatory Visit: Payer: Self-pay | Admitting: Family Medicine

## 2011-05-23 ENCOUNTER — Other Ambulatory Visit (HOSPITAL_COMMUNITY): Payer: Medicare Other

## 2011-05-23 DIAGNOSIS — R911 Solitary pulmonary nodule: Secondary | ICD-10-CM

## 2011-05-23 DIAGNOSIS — J90 Pleural effusion, not elsewhere classified: Secondary | ICD-10-CM | POA: Insufficient documentation

## 2011-05-23 DIAGNOSIS — J984 Other disorders of lung: Secondary | ICD-10-CM | POA: Insufficient documentation

## 2011-05-26 ENCOUNTER — Telehealth: Payer: Self-pay

## 2011-05-26 NOTE — Telephone Encounter (Signed)
Prior authorization form received for Benicar 40 mg. Form is on your shelf in the in box.

## 2011-05-27 NOTE — Telephone Encounter (Signed)
Let him know his insurance does not cover benicar- it covers losartan and avapro and miacardis -- they are roughly the same Does he have any problem with a switch (if we do not he will have to pay full price for the benicar) Let me know what he thinks and return this to me -thanks I will hold form on my desk

## 2011-05-27 NOTE — Telephone Encounter (Signed)
Left vm for pt to callback 

## 2011-05-30 MED ORDER — TELMISARTAN 40 MG PO TABS: 40.0000 mg | ORAL_TABLET | Freq: Every day | ORAL | Status: DC

## 2011-05-30 NOTE — Telephone Encounter (Signed)
Patient notified as instructed by telephone. Was advised by patient that he already has a follow-up appointment scheduled with Dr. Milinda Antis. Rx called to [facility].

## 2011-05-30 NOTE — Telephone Encounter (Signed)
What mg of miacardis does he have at home -- I could not find old record in chart-thanks

## 2011-05-30 NOTE — Telephone Encounter (Signed)
Spoke to patient and was advised that he does not have any Miacardis at home and has not been on this in 3-4 years and does not remember the dose that he was on.  Patient states that he was on Norvasc 10 mg and Dr. Sherene Sires stopped it and he is not sure why. Patient stated he started back taking the Norvasc about 2 weeks ago because his BP was so high running around 201/101 and it has improve since starting back on the Norvasc. Patient states that the Bystolic alone was not controlling his BP alone.

## 2011-05-30 NOTE — Telephone Encounter (Signed)
Informed patient that his insurance doesn't cover Benicar but it covers Losartan and Avapro, Micardis.  He stated he doesn't have a problem switching and he has Miacardis on hand.  Please advise

## 2011-05-30 NOTE — Telephone Encounter (Signed)
Will add miacardis Schedule f/u in about a month unless already scheduled Px written for call in  Or elect if you get his pharmacy

## 2011-06-07 ENCOUNTER — Encounter: Payer: Self-pay | Admitting: Pulmonary Disease

## 2011-06-07 ENCOUNTER — Ambulatory Visit (INDEPENDENT_AMBULATORY_CARE_PROVIDER_SITE_OTHER): Payer: Medicare Other | Admitting: Pulmonary Disease

## 2011-06-07 VITALS — BP 140/70 | HR 68 | Temp 98.1°F | Ht 71.0 in | Wt 242.8 lb

## 2011-06-07 DIAGNOSIS — M3130 Wegener's granulomatosis without renal involvement: Secondary | ICD-10-CM

## 2011-06-07 NOTE — Progress Notes (Signed)
Subjective:    Patient ID: Ryan Morgan, male    DOB: 21-Sep-1940, 71 y.o.   MRN: [id number]  HPI This is a 71 y/o male with known Wegener's who presents to our clinic for follow up.  He has previously followed wih Dr. Sherene Sires for the same.  Briefly, he was diagnosed in 1997 and has not had significant lung or kidney involvement.  He was treated for one year with cytoxan (though records state three years) and twelve years with prednisone.  He has not been on regular prednisone for the last five years but uses it intermittently for sinus congestion, arthralgias or a rash.  He is currently taking it for a rash that developed on his hands several weeks ago.  The rash was red and pruritic and improved with the prednisone.  He states that this is consistent with prior episodes of flares of his disease.     Past Medical History  Diagnosis Date  . Diabetes mellitus   . Hypertension   . Heart disease   . Hyperlipidemia      Family History  Problem Relation Age of Onset  . Cancer Mother     tumor on tonsil  . Stroke Maternal Grandfather      History   Social History  . Marital Status: Married    Spouse Name: N/A    Number of Children: N/A  . Years of Education: N/A   Occupational History  . Not on file.   Social History Main Topics  . Smoking status: Former Smoker -- 0.5 packs/day for 20 years    Types: Cigarettes    Quit date: 05/30/1978  . Smokeless tobacco: Never Used  . Alcohol Use: No  . Drug Use: No  . Sexually Active: Not on file   Other Topics Concern  . Not on file   Social History Narrative  . No narrative on file     Allergies  Allergen Reactions  . Benicar Hct (Olmesartan Medoxomil-Hctz)     Painful and itchy knots on palms of hands  . Niaspan (Niacin (Antihyperlipidemic)) Other (See Comments)    flushing  . Septra (Bactrim) Hives     Outpatient Prescriptions Prior to Visit  Medication Sig Dispense Refill  . fexofenadine (ALLEGRA) 180 MG tablet Take 180 mg by  mouth daily.        Marland Kitchen glimepiride (AMARYL) 4 MG tablet Take 1 tablet by mouth Daily.      . metFORMIN (GLUCOPHAGE) 1000 MG tablet Take 1 tablet by mouth Twice daily.      . mometasone (NASONEX) 50 MCG/ACT nasal spray Place 1 spray into the nose 2 (two) times daily.        . nebivolol (BYSTOLIC) 10 MG tablet Take 10 mg by mouth daily.        Marland Kitchen olopatadine (PATANOL) 0.1 % ophthalmic solution Apply 1 drop to eye 2 (two) times daily.       . predniSONE (DELTASONE) 10 MG tablet Take 1 tablet by mouth Daily.      Marland Kitchen telmisartan (MICARDIS) 40 MG tablet Take 1 tablet (40 mg total) by mouth daily.  30 tablet  11      Review of Systems  Constitutional: Negative for fever, chills, activity change, appetite change and unexpected weight change.  HENT: Negative for congestion, sore throat, rhinorrhea, sneezing, trouble swallowing, dental problem, voice change and postnasal drip.   Eyes: Negative for visual disturbance.  Respiratory: Negative for cough, choking and shortness of breath.   Cardiovascular:  Negative for chest pain and leg swelling.  Gastrointestinal: Negative for nausea, vomiting and abdominal pain.  Genitourinary: Negative for difficulty urinating.  Musculoskeletal: Negative for arthralgias.  Skin: Negative for rash.  Psychiatric/Behavioral: Negative for behavioral problems and confusion.       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  He states that he has been treating himself for a flare which he diagnosed based on a rash on his hand.  The rash sounds like  purpura.  The rash has improved and he plans to stop the prednisone (10 mg daily) next week.  I advised him that this sounded like a reasonable plan.  He should get a BMET with his labs next week to ensure no kidney involvement (has not had it up until this point).  I don't see what an ANCA adds here so I won't order it.  We will see him back in 6 months to see how things are going.  No other changes recommended at this time.    Updated Medication List Outpatient Encounter Prescriptions as of 06/07/2011  Medication Sig Dispense Refill  . aspirin 81 MG tablet Take 160 mg by mouth daily.        . fexofenadine (ALLEGRA) 180 MG tablet Take 180 mg by mouth daily.        Marland Kitchen glimepiride (AMARYL) 4 MG tablet Take 1 tablet by mouth Daily.      . metFORMIN (GLUCOPHAGE) 1000 MG tablet Take 1 tablet by mouth Twice daily.      . mometasone (NASONEX) 50 MCG/ACT nasal spray Place 1 spray into the nose 2 (two) times daily.        . nebivolol (BYSTOLIC) 10 MG tablet Take 10 mg by mouth daily.        Marland Kitchen olopatadine (PATANOL) 0.1 % ophthalmic solution Apply 1 drop to eye 2 (two) times daily.       . predniSONE (DELTASONE) 10 MG tablet Take 1 tablet by mouth Daily.      Marland Kitchen telmisartan (MICARDIS) 40 MG tablet Take 1 tablet (40 mg total) by mouth daily.  30 tablet  11

## 2011-06-07 NOTE — Assessment & Plan Note (Signed)
He is doing quite well in regards to his Wegener's.  He states that he has been treating himself for a flare which he diagnosed based on a rash on his hand.  The rash sounds like purpura.  The rash has improved and he plans to stop the prednisone (10 mg daily) next week.  I advised him that this sounded like a reasonable plan.  He should get a BMET with his labs next week to ensure no kidney involvement (has not had it up until this point).  I don't see what an ANCA adds here so I won't order it.  We will see him back in 6 months to see how things are going.  No other changes recommended at this time.

## 2011-06-07 NOTE — Patient Instructions (Signed)
Continue taking the prednisone for one week as you are doing, then stop. Ask Dr. Milinda Antis to send a basic metabolic panel next week with your lab work. We will see you back in 6 months.

## 2011-06-29 ENCOUNTER — Ambulatory Visit (INDEPENDENT_AMBULATORY_CARE_PROVIDER_SITE_OTHER): Payer: MEDICARE | Admitting: Family Medicine

## 2011-06-29 ENCOUNTER — Encounter: Payer: Self-pay | Admitting: Family Medicine

## 2011-06-29 VITALS — BP 184/100 | HR 72 | Temp 98.0°F | Ht 71.0 in | Wt 249.5 lb

## 2011-06-29 DIAGNOSIS — R519 Headache, unspecified: Secondary | ICD-10-CM | POA: Insufficient documentation

## 2011-06-29 DIAGNOSIS — R51 Headache: Secondary | ICD-10-CM

## 2011-06-29 DIAGNOSIS — I10 Essential (primary) hypertension: Secondary | ICD-10-CM

## 2011-06-29 MED ORDER — TELMISARTAN 80 MG PO TABS: 80.0000 mg | ORAL_TABLET | Freq: Every day | ORAL | Status: DC

## 2011-06-29 NOTE — Assessment & Plan Note (Signed)
bp very high today-- did not take any of his medicines Thinks the bystolic causes ha Will stop that and inc the micardis to 80 He will take bp at home and update [place] fri or mond

## 2011-06-29 NOTE — Assessment & Plan Note (Signed)
Pt is fairly certain this is side eff of bystolic (which is also not working well for bp)  Ace causes cough Will inc micardis to 80  Stop bystolic and update with bp and ha status Consider CT if not imp in light of his fall in dec (first ct was neg) Update if not starting to improve in a week or if worsening

## 2011-06-29 NOTE — Progress Notes (Signed)
Subjective:    Patient ID: Ryan Morgan, male    DOB: 1940/10/30, 71 y.o.   MRN: [id number]  HPI Here for ha and HTN  bp is 184/100-no med yet this am  On miacardis and also bystolic Pulse is 72  Sugars are good   Usually at home runs 165/85  That is still too high  Thinks bystolic does not work and causes ha  Headache is generally on the top of head - radiating to the back of head  Had a fall in December- hit head on the concrete- did a CT at that time - was ok (right after it happened) Headache? If started prior to the fall  No vision changes  No n/v No photophobia or phonophobia Is bilateral  Not throbbing or changing with exertion  Wonders if the bystolic causes headache -- that is a potential side effect   Cough never changes   No other neurol changes   Patient Active Problem List  Diagnoses  . Hypertension  . Hyperlipemia  . Diabetes mellitus, type 2  . Wegener's granulomatosis  . Cough  . Headache on top of head   Past Medical History  Diagnosis Date  . Diabetes mellitus   . Hypertension   . Heart disease   . Hyperlipidemia    Past Surgical History  Procedure Date  . Basal cell carcinoma excision     removed from face x 3  . 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 Cardiac surgery 2000    stent in heart   History  Substance Use Topics  . Smoking status: Former Smoker -- 0.5 packs/day for 20 years    Types: Cigarettes    Quit date: 05/30/1978  . Smokeless tobacco: Never Used  . Alcohol Use: No   Family History  Problem Relation Age of Onset  . Cancer Mother     tumor on tonsil  . Stroke Maternal Grandfather    Allergies  Allergen Reactions  . Benicar Hct (Olmesartan Medoxomil-Hctz)     Painful and itchy knots on palms of hands  . Niaspan (Niacin (Antihyperlipidemic)) Other (See Comments)    flushing  . Septra (Bactrim) Hives   Current Outpatient Prescriptions on File Prior to Visit  Medication Sig Dispense  Refill  . aspirin 81 MG tablet Take 160 mg by mouth daily.        . fexofenadine (ALLEGRA) 180 MG tablet Take 180 mg by mouth daily.        Marland Kitchen glimepiride (AMARYL) 4 MG tablet Take 1 tablet by mouth Daily.      . metFORMIN (GLUCOPHAGE) 1000 MG tablet Take 1 tablet by mouth Twice daily.      . mometasone (NASONEX) 50 MCG/ACT nasal spray Place 1 spray into the nose 2 (two) times daily.        Marland Kitchen olopatadine (PATANOL) 0.1 % ophthalmic solution Apply 1 drop to eye 2 (two) times daily.       . predniSONE (DELTASONE) 10 MG tablet Take 1 tablet by mouth Daily.      . nebivolol (BYSTOLIC) 10 MG tablet Take 10 mg by mouth daily.            Review of Systems Review of Systems  Constitutional: Negative for fever, appetite change, fatigue and unexpected weight change.  Eyes: Negative for pain and visual disturbance.  Respiratory: Negative for cough and shortness of breath.   Cardiovascular: Negative for cp or palpitations    Gastrointestinal:  Negative for nausea, diarrhea and constipation.  Genitourinary: Negative for urgency and frequency.  Skin: Negative for pallor or rash   Neurological: Negative for weakness, light-headedness, numbness and pos for headache Hematological: Negative for adenopathy. Does not bruise/bleed easily.  Psychiatric/Behavioral: Negative for dysphoric mood. The patient is not nervous/anxious.          Objective:   Physical Exam  Constitutional: He appears well-developed and well-nourished. No distress.  HENT:  Head: Normocephalic and atraumatic.  Right Ear: External ear normal.  Left Ear: External ear normal.  Nose: Nose normal.  Mouth/Throat: Oropharynx is clear and moist.       No sinus tenderness No temporal tenderness   Eyes: EOM are normal. Pupils are equal, round, and reactive to light. No scleral icterus.  Neck: Normal range of motion. Neck supple. No JVD present. Carotid bruit is not present. No thyromegaly present.  Cardiovascular: Normal rate, regular  rhythm, normal heart sounds and intact distal pulses.  Exam reveals no gallop.   No murmur heard. Pulmonary/Chest: Effort normal and breath sounds normal. No respiratory distress. He has no wheezes.       Harsh bs   Abdominal: Soft. Bowel sounds are normal. He exhibits no abdominal bruit.  Musculoskeletal: Normal range of motion. He exhibits no edema and no tenderness.  Lymphadenopathy:    He has no cervical adenopathy.  Neurological: He is alert. He has normal strength and normal reflexes. He displays no tremor. No cranial nerve deficit or sensory deficit. He exhibits normal muscle tone. He displays a negative Romberg sign. Coordination and gait normal.       No focal cerebellar signs No facial droop  Skin: Skin is warm and dry. No rash noted. No erythema. No pallor.  Psychiatric: He has a normal mood and affect.          Assessment & Plan:

## 2011-06-29 NOTE — Patient Instructions (Signed)
Increase your micardis (telmisartan) to 80 mg once day (2 of the 40s ) or one 80 mg Stop the bystolic for now Call me at the end of the week or early next week  and let me know what your blood pressures are running at home and how headache is  If headache gets severe- call or seek care  Depending on how you are doing- we may do another imaging study or add more blood pressure medicine

## 2011-07-04 ENCOUNTER — Telehealth: Payer: Self-pay | Admitting: *Deleted

## 2011-07-04 DIAGNOSIS — S098XXA Other specified injuries of head, initial encounter: Secondary | ICD-10-CM

## 2011-07-04 DIAGNOSIS — R519 Headache, unspecified: Secondary | ICD-10-CM

## 2011-07-04 NOTE — Telephone Encounter (Signed)
CT set up 07/05/2011 at 9:30am at [facility].

## 2011-07-04 NOTE — Telephone Encounter (Signed)
Patient notified as instructed by telephone. Pt will wait to hear from pt care coordinator about appt but due to pts headaches pt would like call back about appt ASAP. Pt was reached on cell [phone]. Unable to reach CAN by phone.

## 2011-07-04 NOTE — Telephone Encounter (Signed)
I am ordering CT scan now  Let him and CAN know

## 2011-07-04 NOTE — Telephone Encounter (Signed)
Camille (CAN) called stating that the patient was seen 06/29/11 for headaches for 2 months from a fall that he had. Patient was told to call back in 3 days if headaches were not better and you would go ahead and order a CT scan.  Durward Mallard is calling because based on their protocol patient should be seen within 4 hours. Durward Mallard wants to know if patient should be seen again or should CT scan be ordered. Durward Mallard told patient that the office would be in touch with him regarding this. Durward Mallard advised that the patient stated that his pain level is #6, but he has not taken his pain medication. Please advise the patient.

## 2011-07-05 ENCOUNTER — Ambulatory Visit (INDEPENDENT_AMBULATORY_CARE_PROVIDER_SITE_OTHER)
Admission: RE | Admit: 2011-07-05 | Discharge: 2011-07-05 | Disposition: A | Discharge status: Home / Self Care | Payer: Medicare Other | Source: Ambulatory Visit | Attending: Family Medicine | Admitting: Family Medicine

## 2011-07-05 DIAGNOSIS — R519 Headache, unspecified: Secondary | ICD-10-CM

## 2011-07-05 DIAGNOSIS — S098XXA Other specified injuries of head, initial encounter: Secondary | ICD-10-CM

## 2011-07-05 DIAGNOSIS — S0990XA Unspecified injury of head, initial encounter: Secondary | ICD-10-CM

## 2011-07-05 DIAGNOSIS — R51 Headache: Secondary | ICD-10-CM

## 2011-07-07 ENCOUNTER — Telehealth: Payer: Self-pay | Admitting: Family Medicine

## 2011-07-07 MED ORDER — AMLODIPINE BESYLATE 5 MG PO TABS: 5.0000 mg | ORAL_TABLET | Freq: Every day | ORAL | Status: DC

## 2011-07-07 NOTE — Telephone Encounter (Signed)
Left v/m for pt to call back. Also need to verify pharmacy to be used.

## 2011-07-07 NOTE — Telephone Encounter (Signed)
Message copied by [name] on Thu Jul 07, 2011  3:33 PM ------      Message from: Patience Musca      Created: Thu Jul 07, 2011 12:51 PM      Regarding: elevated BP and H/A update       Please see result note.      ----- Message -----         From: Roxy Manns, MD         Sent: 07/05/2011  10:20 PM           To: Yetta Glassman, LPN            Normal CT scan - that is reassuring       Let me know how headache is and also how bp is if he is checking it at home

## 2011-07-07 NOTE — Telephone Encounter (Signed)
I want to go ahead and add amlodipine (norvasc) to see if we can get bp down further Px written for call in   Update me with how he does with it or if headache worsens

## 2011-07-08 MED ORDER — HYDROCODONE-ACETAMINOPHEN 5-500 MG PO TABS: 1.0000 | ORAL_TABLET | Freq: Four times a day (QID) | ORAL | Status: AC | PRN

## 2011-07-08 NOTE — Telephone Encounter (Signed)
Patient notified as instructed by telephone. Pt said he has Amlodipine 10 mg and will cut in half and take 1/2 tab daily. Pts BP today was 165/84 but has headache today. Pt will update Dr Milinda Antis with how he does and if h/a worsens. Pt already scheduled for CPX 07/15/11. Will call Amlodipine 5 mg to [facility] to profile in computer for later use. Pt request Vicodin 5-500 mg called to [facility] [address]. Pt will pick up Vicodin on Sat.

## 2011-07-08 NOTE — Telephone Encounter (Signed)
Px written for call in   Thanks  

## 2011-07-08 NOTE — Telephone Encounter (Signed)
Medication phoned to [facility] [address] pharmacy as instructed.  

## 2011-07-10 ENCOUNTER — Telehealth: Payer: Self-pay | Admitting: Family Medicine

## 2011-07-10 DIAGNOSIS — E785 Hyperlipidemia, unspecified: Secondary | ICD-10-CM

## 2011-07-10 DIAGNOSIS — E119 Type 2 diabetes mellitus without complications: Secondary | ICD-10-CM

## 2011-07-10 DIAGNOSIS — I10 Essential (primary) hypertension: Secondary | ICD-10-CM

## 2011-07-10 DIAGNOSIS — Z125 Encounter for screening for malignant neoplasm of prostate: Secondary | ICD-10-CM

## 2011-07-10 NOTE — Telephone Encounter (Signed)
Message copied by [name] on Sun Jul 10, 2011  8:48 PM ------      Message from: Alvina Chou      Created: Wed Jul 06, 2011 12:01 PM      Regarding: Lab orders for 07-11-11       Patient is scheduled for CPX labs, please order future labs, Thanks , Camelia Eng

## 2011-07-11 ENCOUNTER — Other Ambulatory Visit: Payer: Self-pay

## 2011-07-11 ENCOUNTER — Other Ambulatory Visit: Payer: Medicare Other

## 2011-07-11 ENCOUNTER — Other Ambulatory Visit (INDEPENDENT_AMBULATORY_CARE_PROVIDER_SITE_OTHER): Payer: Medicare Other

## 2011-07-11 DIAGNOSIS — I10 Essential (primary) hypertension: Secondary | ICD-10-CM

## 2011-07-11 DIAGNOSIS — Z125 Encounter for screening for malignant neoplasm of prostate: Secondary | ICD-10-CM

## 2011-07-11 DIAGNOSIS — E785 Hyperlipidemia, unspecified: Secondary | ICD-10-CM

## 2011-07-11 DIAGNOSIS — E119 Type 2 diabetes mellitus without complications: Secondary | ICD-10-CM

## 2011-07-11 LAB — CBC WITH DIFFERENTIAL/PLATELET
Basophils Absolute: 0 10*3/uL (ref 0.0–0.1)
Basophils Relative: 0.7 % (ref 0.0–3.0)
Eosinophils Absolute: 0.1 10*3/uL (ref 0.0–0.7)
Lymphocytes Relative: 36.3 % (ref 12.0–46.0)
MCHC: 34.8 g/dL (ref 30.0–36.0)
MCV: 91.2 fl (ref 78.0–100.0)
Monocytes Absolute: 0.4 10*3/uL (ref 0.1–1.0)
Neutro Abs: 2.4 10*3/uL (ref 1.4–7.7)
Neutrophils Relative %: 53.1 % (ref 43.0–77.0)
RBC: 4.37 Mil/uL (ref 4.22–5.81)
RDW: 15.2 % — ABNORMAL HIGH (ref 11.5–14.6)

## 2011-07-11 LAB — LIPID PANEL
Cholesterol: 191 mg/dL (ref 0–200)
HDL: 35.1 mg/dL — ABNORMAL LOW (ref >39.00)
LDL Cholesterol: 128 mg/dL — ABNORMAL HIGH (ref 0–99)
Total CHOL/HDL Ratio: 5
Triglycerides: 139 mg/dL (ref 0.0–149.0)
VLDL: 27.8 mg/dL (ref 0.0–40.0)

## 2011-07-11 LAB — COMPREHENSIVE METABOLIC PANEL
ALT: 13 U/L (ref 0–53)
AST: 15 U/L (ref 0–37)
Alkaline Phosphatase: 65 U/L (ref 39–117)
BUN: 13 mg/dL (ref 6–23)
Creatinine, Ser: 0.7 mg/dL (ref 0.4–1.5)
Potassium: 3.7 mEq/L (ref 3.5–5.1)

## 2011-07-11 NOTE — Telephone Encounter (Signed)
Pt said Hydrocodone APAP refill was not at [facility] [address]. I called and spoke with pharmacist at [facility] [address] med was called in 07/08/11 as instructed and left on pharmacist voicemail.Patient notified pharmacist would get rx ready for pick up.

## 2011-07-15 ENCOUNTER — Ambulatory Visit (INDEPENDENT_AMBULATORY_CARE_PROVIDER_SITE_OTHER): Payer: Medicare Other | Admitting: Family Medicine

## 2011-07-15 ENCOUNTER — Encounter: Payer: Self-pay | Admitting: Family Medicine

## 2011-07-15 VITALS — BP 146/80 | HR 80 | Temp 97.9°F | Ht 70.0 in | Wt 245.8 lb

## 2011-07-15 DIAGNOSIS — I10 Essential (primary) hypertension: Secondary | ICD-10-CM

## 2011-07-15 DIAGNOSIS — E119 Type 2 diabetes mellitus without complications: Secondary | ICD-10-CM

## 2011-07-15 DIAGNOSIS — E66811 Obesity, class 1: Secondary | ICD-10-CM | POA: Insufficient documentation

## 2011-07-15 DIAGNOSIS — R51 Headache: Secondary | ICD-10-CM

## 2011-07-15 DIAGNOSIS — Z125 Encounter for screening for malignant neoplasm of prostate: Secondary | ICD-10-CM

## 2011-07-15 DIAGNOSIS — Z23 Encounter for immunization: Secondary | ICD-10-CM

## 2011-07-15 DIAGNOSIS — E785 Hyperlipidemia, unspecified: Secondary | ICD-10-CM

## 2011-07-15 DIAGNOSIS — E669 Obesity, unspecified: Secondary | ICD-10-CM | POA: Insufficient documentation

## 2011-07-15 DIAGNOSIS — R519 Headache, unspecified: Secondary | ICD-10-CM

## 2011-07-15 MED ORDER — METFORMIN HCL 1000 MG PO TABS: 1000.0000 mg | ORAL_TABLET | Freq: Two times a day (BID) | ORAL | Status: DC

## 2011-07-15 MED ORDER — NEBIVOLOL HCL 5 MG PO TABS: 5.0000 mg | ORAL_TABLET | Freq: Two times a day (BID) | ORAL | Status: DC

## 2011-07-15 MED ORDER — GLIMEPIRIDE 4 MG PO TABS: 4.0000 mg | ORAL_TABLET | Freq: Every day | ORAL | Status: DC

## 2011-07-15 MED ORDER — AMLODIPINE BESYLATE 5 MG PO TABS: 5.0000 mg | ORAL_TABLET | Freq: Every day | ORAL | Status: DC

## 2011-07-15 MED ORDER — ATORVASTATIN CALCIUM 40 MG PO TABS: 40.0000 mg | ORAL_TABLET | Freq: Every day | ORAL | Status: DC

## 2011-07-15 MED ORDER — TELMISARTAN 80 MG PO TABS: 80.0000 mg | ORAL_TABLET | Freq: Every day | ORAL | Status: DC

## 2011-07-15 NOTE — Assessment & Plan Note (Signed)
Was not from bystolic - seemed to be rel to high bp  Now is much imp with bp coming down Will continue to monitor Update if not starting to improve in a week or if worsening

## 2011-07-15 NOTE — Assessment & Plan Note (Signed)
Discussed how this problem influences overall health and the risks it imposes  Reviewed plan for weight loss with lower calorie diet (via better food choices and also portion control or program like weight watchers) and exercise building up to or more than 30 minutes 5 days per week including some aerobic activity   Pt has lost wt before and is working on it again

## 2011-07-15 NOTE — Assessment & Plan Note (Signed)
bp better Ha better  3 drug regimen- refilled Track at home F/u 3 mo  Labs reviewed

## 2011-07-15 NOTE — Progress Notes (Signed)
Subjective:    Patient ID: Ryan Morgan, male    DOB: [birth year], 71 y.o.   MRN: [id number]  HPI Here for check up of chronic medical conditions and to review health mt list    bp is  146/80   Today Overall this is much better  Now on miacardis and also bystolic  Thought bystolic caused headache - but was not that  No cp or palpitations  or edema  No side effects to medicines   At home bp are finally starting to come down - was 149/87   Headache- is on the down slope Barely any headache today  Has gradually become better with dec in his bp  Turns out - bystolic did not cause it after all  CT came back normal   Wt is down 4 lb- doing good - after stopping prednisone Is obese-working on weight loss   Lipids are up  Was on lipitor- stopped it for rash - but turns out it was not  Lab Results  Component Value Date   CHOL 191 07/11/2011   CHOL 114 12/28/2010   CHOL 191 10/11/2010   Lab Results  Component Value Date   HDL 35.10* 07/11/2011   HDL 39.00* 12/28/2010   HDL 35.70* 10/11/2010   Lab Results  Component Value Date   LDLCALC 128* 07/11/2011   LDLCALC 45 12/28/2010   LDLCALC 123* 10/11/2010   Lab Results  Component Value Date   TRIG 139.0 07/11/2011   TRIG 152.0* 12/28/2010   TRIG 163.0* 10/11/2010   Lab Results  Component Value Date   CHOLHDL 5 07/11/2011   CHOLHDL 3 12/28/2010   CHOLHDL 5 10/11/2010   No results found for this basename: LDLDIRECT   diet- overall is very good  Exercise- is working in yard and in his shop , some walking   Diabetes Home sugar results - now getting  Better -most of the time below 130 in am , checks less in eve- some below 130 , noe time was 243 DM diet - doing better now  Was also on prednisone  Exercise - is fairly good  Symptoms- none  A1C last 8.0 up from 6.9 (was on prednisone in dec and ate off the wagon for the holidays) On amaryl and metformin No problems with medications  Renal protection- cannot take ace or arb due to all  rxn Last eye exam - was 4 mo ago -- no retinopathy   psa 1.08- ok  No prostate problems  Gets up at night 0-3 times- standard without change   Td-5 years ago  Zoster status-not interested  Pneumovax-needs that today  Colon cancer screen Has done colonoscopy in the past (2 of them)  Last one was 5 years ago - normal / no fam hx / no personal polyp hx   Patient Active Problem List  Diagnoses  . Hypertension  . Hyperlipemia  . Diabetes mellitus, type 2  . Wegener's granulomatosis  . Cough  . Headache on top of head  . Blunt head trauma  . Prostate cancer screening  . Obesity   Past Medical History  Diagnosis Date  . Diabetes mellitus   . Hypertension   . Heart disease   . Hyperlipidemia    Past Surgical History  Procedure Date  . Basal cell carcinoma excision     removed from face x 3  . 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 Cardiac surgery 2000    stent  in heart   History  Substance Use Topics  . Smoking status: Former Smoker -- 0.5 packs/day for 20 years    Types: Cigarettes    Quit date: 05/30/1978  . Smokeless tobacco: Never Used  . Alcohol Use: No   Family History  Problem Relation Age of Onset  . Cancer Mother     tumor on tonsil  . Stroke Maternal Grandfather    Allergies  Allergen Reactions  . Benicar Hct (Olmesartan Medoxomil-Hctz)     Painful and itchy knots on palms of hands  . Niaspan (Niacin (Antihyperlipidemic)) Other (See Comments)    flushing  . Septra (Bactrim) Hives   Current Outpatient Prescriptions on File Prior to Visit  Medication Sig Dispense Refill  . aspirin 81 MG tablet Take 160 mg by mouth daily.        . fexofenadine (ALLEGRA) 180 MG tablet Take 180 mg by mouth daily.        Marland Kitchen HYDROcodone-acetaminophen (VICODIN) 5-500 MG per tablet Take 1 tablet by mouth every 6 (six) hours as needed for pain.  20 tablet  0  . mometasone (NASONEX) 50 MCG/ACT nasal spray Place 1 spray into the nose 2 (two) times  daily.        Marland Kitchen olopatadine (PATANOL) 0.1 % ophthalmic solution Apply 1 drop to eye 2 (two) times daily.          Review of Systems Review of Systems  Constitutional: Negative for fever, appetite change, fatigue and unexpected weight change.  Eyes: Negative for pain and visual disturbance.  ENT no sinus cong or pain , no hearing loss  Respiratory: Negative for cough and shortness of breath.   Cardiovascular: Negative for cp or palpitations    Gastrointestinal: Negative for nausea, diarrhea and constipation.  Genitourinary: Negative for urgency and frequency. neg for excessive thirst  Skin: Negative for pallor or rash   Neurological: Negative for weakness, light-headedness, numbness and pos for headache that is much imp , neg for facial droop or speech change Hematological: Negative for adenopathy. Does not bruise/bleed easily.  Psychiatric/Behavioral: Negative for dysphoric mood. The patient is not nervous/anxious.          Objective:   Physical Exam  Constitutional: He is oriented to person, place, and time. He appears well-developed and well-nourished. No distress.       Obese and well appearing   HENT:  Head: Normocephalic and atraumatic.  Right Ear: External ear normal.  Left Ear: External ear normal.  Nose: Nose normal.  Mouth/Throat: Oropharynx is clear and moist.       No sinus or temporal tenderness  Eyes: Conjunctivae and EOM are normal. Right eye exhibits no discharge. Left eye exhibits no discharge. No scleral icterus.  Neck: Normal range of motion. Neck supple. No JVD present. Carotid bruit is not present. No thyromegaly present.  Cardiovascular: Normal rate, regular rhythm, normal heart sounds and intact distal pulses.  Exam reveals no gallop.   Pulmonary/Chest: Effort normal and breath sounds normal. No respiratory distress. He has no wheezes. He has no rales. He exhibits no tenderness.       Diffusely distant bs   Abdominal: Soft. Bowel sounds are normal. He  exhibits no distension and no mass. There is no tenderness.  Genitourinary: Rectum normal. Rectal exam shows anal tone normal. Guaiac negative stool. Prostate is enlarged. Prostate is not tender.       Prostate is mildly enlarged nt  Smooth and symmetric Not boggy  Musculoskeletal: Normal range of  motion. He exhibits no edema and no tenderness.  Lymphadenopathy:    He has no cervical adenopathy.  Neurological: He is alert and oriented to person, place, and time. He has normal strength and normal reflexes. He displays no tremor. No cranial nerve deficit or sensory deficit. He exhibits normal muscle tone. Coordination and gait normal.  Skin: Skin is warm and dry. No rash noted. No erythema. No pallor.  Psychiatric: He has a normal mood and affect.          Assessment & Plan:

## 2011-07-15 NOTE — Patient Instructions (Addendum)
Get started back on lipitor Avoid red meat/ fried foods/ egg yolks/ fatty breakfast meats/ butter, cheese and high fat dairy/ and shellfish   Check pm sugars 2 hours after a meal Pneumonia vaccine today  Blood pressure is coming down -keep tracking it  If headache gets worse let me know  Schedule fasting lab and follow up in 3 months  I sent your medicines to [facility]

## 2011-07-15 NOTE — Assessment & Plan Note (Signed)
Stable DRE and psa Mild enlargement - no problems

## 2011-07-15 NOTE — Assessment & Plan Note (Signed)
Chol up off lipitor Now that we know it did not cause rash on hands- will re start it  Lab 3 mo and f/u Disc goals for lipids and reasons to control them Rev labs with pt Rev low sat fat diet in detail

## 2011-07-15 NOTE — Assessment & Plan Note (Signed)
a1c up from prednisone and poor eating in dec Getting  Back on track No change in med  a1c and f/u in 3 mo

## 2011-07-18 ENCOUNTER — Ambulatory Visit: Payer: Medicare Other | Admitting: Family Medicine

## 2011-08-02 ENCOUNTER — Telehealth: Payer: Self-pay | Admitting: *Deleted

## 2011-08-02 NOTE — Telephone Encounter (Signed)
Patient called stating that his headaches have stopped after being off of his Bystolic for 3 months. Patient states that he needs a new generic medication to replace his Bystolic. Please advise.

## 2011-08-02 NOTE — Telephone Encounter (Signed)
I saw him on 2/15 and was under the impression he had restarted the bystolic and that headaches are improving with better bp control  In fact - we discussed the fact that he no longer thought the bystolic caused his headaches - so I am confused about that so please clarify with him Glad ha is better, ?how is bp  I am out of town for the rest of the week

## 2011-08-03 NOTE — Telephone Encounter (Signed)
Spoke with patient and he stated that he ran out of Bystolic so he was taking Amlodipine 10mg , he just got the Rx for Amlopidine 5mg  filled today so he stated that he will just take two 5mg  tablets until you return.    BP readings: 08/03/2011 165/93                       08/02/2011 180/94                       07/31/2011 152/86

## 2011-08-07 NOTE — Telephone Encounter (Signed)
It takes amlodipine a while to ramp up - but ask how bp is doing now, thanks

## 2011-08-08 NOTE — Telephone Encounter (Signed)
Patient notified as instructed by telephone. Pt said  07/31/11 BP 156/86, 08/02/11 165/93,08/06/11 167/87 and today BP is 136/93( pt was outside working in yard prior to just taking BP). Please advise.

## 2011-08-08 NOTE — Telephone Encounter (Signed)
Patient notified as instructed by telephone. 

## 2011-08-08 NOTE — Telephone Encounter (Signed)
Ok- today's was better- take this dose for the rest of the week and update me on Friday-thanks

## 2011-08-08 NOTE — Telephone Encounter (Signed)
Left message for pt to call back  °

## 2011-08-15 ENCOUNTER — Telehealth: Payer: Self-pay | Admitting: *Deleted

## 2011-08-15 NOTE — Telephone Encounter (Signed)
Patient called to give Dr.Tower a list of his BP readings starting from 07/31/2011: 152/86 160/94  165/93 163/89 136/93 157/86 156/83 145/84 153/85 152/84

## 2011-08-16 MED ORDER — SPIRONOLACTONE 25 MG PO TABS: 25.0000 mg | ORAL_TABLET | Freq: Every day | ORAL | Status: DC

## 2011-08-16 NOTE — Telephone Encounter (Signed)
Ok - so verify that he is on norvasc (amlodipine) 10 , and also micardis 80 - and still off the bystolic If so - I want to add a new med (fluid pill ) called spironolactone (in past he was on hctz which may have caused rash ) Follow up in 1-3 weeks then and will do lab and follow up , as this med can cause changes in K level Px written for call in  After you have talked to him, thanks

## 2011-08-16 NOTE — Telephone Encounter (Signed)
Patient advised as instructed via telephone, he takes Amlodipine 10mg , Micardis 80mg , and he does not take Bystolic.  Rx for Spironolactone sent to [facility], patient will call back to schedule f/u appt.

## 2011-09-28 ENCOUNTER — Other Ambulatory Visit: Payer: Self-pay | Admitting: *Deleted

## 2011-09-28 MED ORDER — SPIRONOLACTONE 25 MG PO TABS: 25.0000 mg | ORAL_TABLET | Freq: Every day | ORAL | Status: DC

## 2011-09-28 NOTE — Telephone Encounter (Signed)
Will refill electronically  

## 2011-10-10 ENCOUNTER — Other Ambulatory Visit: Payer: Medicare Other

## 2011-10-14 ENCOUNTER — Ambulatory Visit: Payer: Medicare Other | Admitting: Family Medicine

## 2011-10-17 ENCOUNTER — Other Ambulatory Visit (INDEPENDENT_AMBULATORY_CARE_PROVIDER_SITE_OTHER): Payer: Medicare Other

## 2011-10-17 DIAGNOSIS — E785 Hyperlipidemia, unspecified: Secondary | ICD-10-CM

## 2011-10-17 DIAGNOSIS — IMO0001 Reserved for inherently not codable concepts without codable children: Secondary | ICD-10-CM

## 2011-10-17 DIAGNOSIS — E119 Type 2 diabetes mellitus without complications: Secondary | ICD-10-CM

## 2011-10-17 LAB — LIPID PANEL
Total CHOL/HDL Ratio: 3
VLDL: 30.2 mg/dL (ref 0.0–40.0)

## 2011-10-17 LAB — AST: AST: 15 U/L (ref 0–37)

## 2011-10-17 LAB — ALT: ALT: 18 U/L (ref 0–53)

## 2011-10-17 LAB — HEMOGLOBIN A1C: Hgb A1c MFr Bld: 7.1 % — ABNORMAL HIGH (ref 4.6–6.5)

## 2011-10-19 ENCOUNTER — Ambulatory Visit (INDEPENDENT_AMBULATORY_CARE_PROVIDER_SITE_OTHER): Payer: Medicare Other | Admitting: Family Medicine

## 2011-10-19 ENCOUNTER — Encounter: Payer: Self-pay | Admitting: Family Medicine

## 2011-10-19 VITALS — BP 132/70 | HR 84 | Temp 97.9°F | Ht 70.0 in | Wt 251.0 lb

## 2011-10-19 DIAGNOSIS — E119 Type 2 diabetes mellitus without complications: Secondary | ICD-10-CM

## 2011-10-19 DIAGNOSIS — E785 Hyperlipidemia, unspecified: Secondary | ICD-10-CM

## 2011-10-19 DIAGNOSIS — I10 Essential (primary) hypertension: Secondary | ICD-10-CM

## 2011-10-19 NOTE — Progress Notes (Signed)
Subjective:    Patient ID: Ryan Morgan, male    DOB: [birth year], 71 y.o.   MRN: [id number]  HPI  Here for f/u of chronic conditions  Seeing cardiolgist next week   BP Readings from Last 3 Encounters:  10/19/11 132/70  07/15/11 146/80  06/29/11 184/100   bp is good    Today No cp or palpitations or headaches or edema  No side effects to medicines    Hyperlipidemia Back on lipitor Lab Results  Component Value Date   CHOL 124 10/17/2011   CHOL 191 07/11/2011   CHOL 114 12/28/2010   Lab Results  Component Value Date   HDL 36.40* 10/17/2011   HDL 35.10* 07/11/2011   HDL 39.00* 12/28/2010   Lab Results  Component Value Date   LDLCALC 57 10/17/2011   LDLCALC 128* 07/11/2011   LDLCALC 45 12/28/2010   Lab Results  Component Value Date   TRIG 151.0* 10/17/2011   TRIG 139.0 07/11/2011   TRIG 152.0* 12/28/2010   Lab Results  Component Value Date   CHOLHDL 3 10/17/2011   CHOLHDL 5 07/11/2011   CHOLHDL 3 12/28/2010   No results found for this basename: LDLDIRECT   lipitor is not giving him any problems    Never found correlation -- hand itching with anything , but poss wegners Had a few short episodes -- takes prednisone and allegra and steroid cream   Diabetes- last visit decided to get back on track with his diet/ exercise Wt is up 6 lb with bmi of 36 Home sugar results post prandial - ranges from 120s - highest in low 200s- usually on the lower side-- avg 140-160 , this am was 137 DM diet - less sweets - eating better in general Exercise -now on treadmill - is up to 15 minutes at a time  Symptoms A1C last down to 7.1- much improved  No problems with medications  Renal protection- in miacardis  Last eye exam was in the fall, normal   Patient Active Problem List  Diagnoses  . Hypertension  . Hyperlipemia  . Diabetes mellitus, type 2  . Wegener's granulomatosis  . Cough  . Headache on top of head  . Blunt head trauma  . Prostate cancer screening  . Obesity   Past  Medical History  Diagnosis Date  . Diabetes mellitus   . Hypertension   . Heart disease   . Hyperlipidemia    Past Surgical History  Procedure Date  . Basal cell carcinoma excision     removed from face x 3  . 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 Cardiac surgery 2000    stent in heart   History  Substance Use Topics  . Smoking status: Former Smoker -- 0.5 packs/day for 20 years    Types: Cigarettes    Quit date: 05/30/1978  . Smokeless tobacco: Never Used  . Alcohol Use: No   Family History  Problem Relation Age of Onset  . Cancer Mother     tumor on tonsil  . Stroke Maternal Grandfather    Allergies  Allergen Reactions  . Benicar Hct (Olmesartan Medoxomil-Hctz)     Painful and itchy knots on palms of hands  . Bystolic (Nebivolol Hcl)     headache  . Niaspan (Niacin Er) Other (See Comments)    flushing  . Septra (Bactrim) Hives   Current Outpatient Prescriptions on File Prior to Visit  Medication Sig Dispense Refill  . amLODipine (NORVASC)  5 MG tablet Take 5 mg by mouth daily.       Marland Kitchen aspirin 81 MG tablet Take 160 mg by mouth daily.        Marland Kitchen atorvastatin (LIPITOR) 40 MG tablet Take 1 tablet (40 mg total) by mouth daily.  90 tablet  3  . fexofenadine (ALLEGRA) 180 MG tablet Take 180 mg by mouth daily.        Marland Kitchen glimepiride (AMARYL) 4 MG tablet Take 1 tablet (4 mg total) by mouth daily before breakfast.  90 tablet  3  . metFORMIN (GLUCOPHAGE) 1000 MG tablet Take 1 tablet (1,000 mg total) by mouth 2 (two) times daily.  180 tablet  3  . mometasone (NASONEX) 50 MCG/ACT nasal spray Place 1 spray into the nose 2 (two) times daily.        Marland Kitchen olopatadine (PATANOL) 0.1 % ophthalmic solution Apply 1 drop to eye 2 (two) times daily.       Marland Kitchen spironolactone (ALDACTONE) 25 MG tablet Take 1 tablet (25 mg total) by mouth daily.  90 tablet  3  . telmisartan (MICARDIS) 80 MG tablet Take 1 tablet (80 mg total) by mouth daily.  90 tablet  3  . DISCONTD:  nebivolol (BYSTOLIC) 5 MG tablet Take 1 tablet (5 mg total) by mouth 2 (two) times daily.  180 tablet  3         Review of Systems Review of Systems  Constitutional: Negative for fever, appetite change, fatigue and unexpected weight change.  Eyes: Negative for pain and visual disturbance.  Respiratory: Negative for cough and shortness of breath.   Cardiovascular: Negative for cp or palpitations    Gastrointestinal: Negative for nausea, diarrhea and constipation.  Genitourinary: Negative for urgency and frequency.  Skin: Negative for pallor or rash   Neurological: Negative for weakness, light-headedness, numbness and headaches.  Hematological: Negative for adenopathy. Does not bruise/bleed easily.  Psychiatric/Behavioral: Negative for dysphoric mood. The patient is not nervous/anxious.         Objective:   Physical Exam  Constitutional: He appears well-developed and well-nourished. No distress.       Obese and well appearing   HENT:  Head: Normocephalic and atraumatic.  Mouth/Throat: Oropharynx is clear and moist.  Eyes: Conjunctivae and EOM are normal. Pupils are equal, round, and reactive to light. No scleral icterus.  Neck: Normal range of motion. Neck supple. No JVD present. Carotid bruit is not present. No thyromegaly present.  Cardiovascular: Normal rate, regular rhythm, normal heart sounds and intact distal pulses.  Exam reveals no gallop.   Pulmonary/Chest: Effort normal and breath sounds normal. No respiratory distress. He has no wheezes.  Abdominal: Soft. Bowel sounds are normal. He exhibits no distension, no abdominal bruit and no mass. There is no tenderness.  Musculoskeletal: He exhibits no edema and no tenderness.  Lymphadenopathy:    He has no cervical adenopathy.  Neurological: He is alert. He has normal reflexes. No cranial nerve deficit. He exhibits normal muscle tone. Coordination normal.  Skin: Skin is warm and dry. No rash noted. No erythema. No pallor.        Ruddy complexion  Psychiatric: He has a normal mood and affect.          Assessment & Plan:

## 2011-10-19 NOTE — Assessment & Plan Note (Signed)
Improved with a1c of 7.1 and better home sugar readings  Enc to check daily to bid - am fasting and 2 h pp- he is starting to rec what foods are worse than others  wil continue the amaryl and metformin  Disc goals and low glycemic diet  Will work for goal of exercise 5 d per week  Lab for a1c and f/u 3 mo

## 2011-10-19 NOTE — Patient Instructions (Signed)
Keep working on exercise - goal is to eventually reach 30 minutes 5 days per week  a1c and cholesterol are improved- I am pleased  Keep working on diet and portion control and weight loss No change in medicines  Schedule non fasting labs and follow up in about 3 months

## 2011-10-19 NOTE — Assessment & Plan Note (Signed)
bp is staying at goal with current meds and no side eff! bp in fair control at this time  No changes needed  Disc lifstyle change with low sodium diet and exercise   F/u 3 mo

## 2011-10-19 NOTE — Assessment & Plan Note (Signed)
This is improved with lipitor- and tolerating that well  Disc goals for lipids and reasons to control them Rev labs with pt Rev low sat fat diet in detail  Will continue exercise to further raise HDL

## 2012-01-16 ENCOUNTER — Other Ambulatory Visit (INDEPENDENT_AMBULATORY_CARE_PROVIDER_SITE_OTHER): Payer: Medicare Other

## 2012-01-16 DIAGNOSIS — I10 Essential (primary) hypertension: Secondary | ICD-10-CM

## 2012-01-16 DIAGNOSIS — E119 Type 2 diabetes mellitus without complications: Secondary | ICD-10-CM

## 2012-01-16 LAB — COMPREHENSIVE METABOLIC PANEL
Albumin: 3.7 g/dL (ref 3.5–5.2)
Alkaline Phosphatase: 65 U/L (ref 39–117)
BUN: 12 mg/dL (ref 6–23)
Glucose, Bld: 181 mg/dL — ABNORMAL HIGH (ref 70–99)
Total Bilirubin: 0.4 mg/dL (ref 0.3–1.2)

## 2012-01-20 ENCOUNTER — Ambulatory Visit: Payer: Medicare Other | Admitting: Family Medicine

## 2012-01-24 ENCOUNTER — Ambulatory Visit (INDEPENDENT_AMBULATORY_CARE_PROVIDER_SITE_OTHER): Payer: Medicare Other | Admitting: Family Medicine

## 2012-01-24 ENCOUNTER — Encounter: Payer: Self-pay | Admitting: Family Medicine

## 2012-01-24 VITALS — BP 130/82 | HR 79 | Temp 98.4°F | Ht 70.5 in | Wt 250.2 lb

## 2012-01-24 DIAGNOSIS — E669 Obesity, unspecified: Secondary | ICD-10-CM

## 2012-01-24 DIAGNOSIS — I10 Essential (primary) hypertension: Secondary | ICD-10-CM

## 2012-01-24 DIAGNOSIS — E119 Type 2 diabetes mellitus without complications: Secondary | ICD-10-CM

## 2012-01-24 NOTE — Patient Instructions (Signed)
Work hard on diabetic diet and exercise In next 3 months please check sugar am fasting and 2 hours after meal- keep a log of it  Turn away gifts of sweets for your family  Use your free time in the evenings to shop/ prep meals ahead of time and exercise  Your eye exam looks to be due in October  Work on weight loss Schedule fasting lab and follow up in 3 months

## 2012-01-24 NOTE — Progress Notes (Signed)
  Subjective:    Patient ID: Ryan Morgan, male    DOB: 1941/03/10, 71 y.o.   MRN: [id number]  HPI  Here for f/u of DM  Has had a rough summer - wife will have surgery soon on her back Then leaving for a trip  Wt is stable with bmi of 35 Obese range Has not had time to think about workin on we loss Still able to walk on treadmill but has not increased time like he wanted to Lost some motivation Knows wt loss would help the DM   bp is stable today  No cp or palpitations or headaches or edema  No side effects to medicines  BP Readings from Last 3 Encounters:  01/24/12 130/82  10/19/11 132/70  07/15/11 146/80     Diabetes Home sugar results -not checking much -- taking care of his wife  DM diet -most of the days he does well , does not eat on a regular schedule and some fast food  Needs to get some help at home  Exercise - is still walking on the treadmill - still about 20 minutes -- from 2 times a day to days without it  Symptoms  Is urinating more at night  A1C last  Lab Results  Component Value Date   HGBA1C 7.9* 01/16/2012  this is up from 7.1   No problems with medications - amaryl and metformin Renal protection-on ARB  Some people in the family are bringing sweets to the house - is a big part of it  Last eye exam 10/12    Review of Systems Review of Systems  Constitutional: Negative for fever, appetite change,  and unexpected weight change. some fatigue from rough schedule lately Eyes: Negative for pain and visual disturbance.  Respiratory: Negative for cough and shortness of breath.   Cardiovascular: Negative for cp or palpitations    Gastrointestinal: Negative for nausea, diarrhea and constipation.  Genitourinary: Negative for urgency and frequency.  Skin: Negative for pallor or rash   Neurological: Negative for weakness, light-headedness, numbness and headaches.  Hematological: Negative for adenopathy. Does not bruise/bleed easily.  Psychiatric/Behavioral:  Negative for dysphoric mood. The patient is not nervous/anxious.  under a lot of stress       Objective:   Physical Exam  Constitutional: He appears well-developed and well-nourished. No distress.       obese and well appearing   HENT:  Head: Normocephalic and atraumatic.  Mouth/Throat: Oropharynx is clear and moist.  Eyes: Conjunctivae and EOM are normal. Pupils are equal, round, and reactive to light. No scleral icterus.  Neck: Normal range of motion. Neck supple. No JVD present. No thyromegaly present.  Cardiovascular: Normal rate, regular rhythm, normal heart sounds and intact distal pulses.  Exam reveals no gallop.   Pulmonary/Chest: Effort normal and breath sounds normal. No respiratory distress. He has no wheezes.  Abdominal: Soft. Bowel sounds are normal. He exhibits no distension and no mass. There is no tenderness.  Musculoskeletal: He exhibits no edema.  Lymphadenopathy:    He has no cervical adenopathy.  Neurological: He is alert. He has normal reflexes. No cranial nerve deficit. He exhibits normal muscle tone. Coordination normal.  Skin: Skin is warm and dry. No rash noted. No pallor.  Psychiatric: He has a normal mood and affect.          Assessment & Plan:

## 2012-01-24 NOTE — Assessment & Plan Note (Signed)
bp in fair control at this time  No changes needed  Disc lifstyle change with low sodium diet and exercise  F/u and lab in 3 mo

## 2012-01-24 NOTE — Assessment & Plan Note (Signed)
DM is worse with a1c of 7.9- pt blames bad habits/ caregiving/ and prednisone(1 week) for this He declines further medication but will work harder on lifestyle Had a lengthly disc of schedule and ways to inc meal prep /shopping and exercise Adv to check sugar bid F/u 3 mo after lab

## 2012-01-26 NOTE — Assessment & Plan Note (Signed)
Pt aware that wt loss would help control the DM Discussed how this problem influences overall health and the risks it imposes  Reviewed plan for weight loss with lower calorie diet (via better food choices and also portion control or program like weight watchers) and exercise building up to or more than 30 minutes 5 days per week including some aerobic activity

## 2012-02-16 ENCOUNTER — Ambulatory Visit (INDEPENDENT_AMBULATORY_CARE_PROVIDER_SITE_OTHER): Payer: Medicare Other

## 2012-02-16 DIAGNOSIS — Z23 Encounter for immunization: Secondary | ICD-10-CM

## 2012-03-31 ENCOUNTER — Emergency Department: Payer: Self-pay | Admitting: Emergency Medicine

## 2012-04-30 ENCOUNTER — Other Ambulatory Visit: Payer: Medicare Other

## 2012-05-04 ENCOUNTER — Ambulatory Visit: Payer: Medicare Other | Admitting: Family Medicine

## 2012-06-21 ENCOUNTER — Other Ambulatory Visit: Payer: Self-pay | Admitting: *Deleted

## 2012-06-21 MED ORDER — AMLODIPINE BESYLATE 5 MG PO TABS: 5.0000 mg | ORAL_TABLET | Freq: Every day | ORAL | Status: DC

## 2012-06-21 MED ORDER — TELMISARTAN 80 MG PO TABS: 80.0000 mg | ORAL_TABLET | Freq: Every day | ORAL | Status: DC

## 2012-06-22 ENCOUNTER — Other Ambulatory Visit: Payer: Self-pay | Admitting: Family Medicine

## 2012-06-22 MED ORDER — ATORVASTATIN CALCIUM 40 MG PO TABS: 40.0000 mg | ORAL_TABLET | Freq: Every day | ORAL | Status: DC

## 2012-06-26 ENCOUNTER — Telehealth: Payer: Self-pay | Admitting: *Deleted

## 2012-06-26 NOTE — Telephone Encounter (Signed)
Med clarification form in your IN box

## 2012-06-27 NOTE — Telephone Encounter (Signed)
Form faxed back to pharm

## 2012-06-27 NOTE — Telephone Encounter (Signed)
Done and in IN box 

## 2012-08-01 ENCOUNTER — Other Ambulatory Visit: Payer: Self-pay

## 2012-08-01 MED ORDER — GLIMEPIRIDE 4 MG PO TABS: 4.0000 mg | ORAL_TABLET | Freq: Every day | ORAL | Status: DC

## 2012-08-01 NOTE — Telephone Encounter (Signed)
appt scheduled and meds refilled 

## 2012-08-01 NOTE — Telephone Encounter (Signed)
Please send the px to [facility] and schedule appt with me with labs prior-thanks

## 2012-08-01 NOTE — Telephone Encounter (Signed)
Pt left v/m is out of Glimepiride and request 30 day rx sent to [facility] [address]. [facility] will contact Dr Milinda Antis about 3 month refill. Pt was seen 01/24/12 and was to have repeat labs in 3 months; pt did not have repeat labs.Does pt need appt? Please advise.

## 2012-08-06 ENCOUNTER — Other Ambulatory Visit: Payer: Self-pay | Admitting: Family Medicine

## 2012-08-06 MED ORDER — GLIMEPIRIDE 4 MG PO TABS: 4.0000 mg | ORAL_TABLET | Freq: Every day | ORAL | Status: DC

## 2012-08-10 ENCOUNTER — Other Ambulatory Visit: Payer: Self-pay | Admitting: *Deleted

## 2012-08-10 MED ORDER — METFORMIN HCL 1000 MG PO TABS: 1000.0000 mg | ORAL_TABLET | Freq: Two times a day (BID) | ORAL | Status: DC

## 2012-08-13 ENCOUNTER — Other Ambulatory Visit (INDEPENDENT_AMBULATORY_CARE_PROVIDER_SITE_OTHER): Payer: Medicare Other

## 2012-08-13 DIAGNOSIS — I10 Essential (primary) hypertension: Secondary | ICD-10-CM

## 2012-08-13 DIAGNOSIS — E119 Type 2 diabetes mellitus without complications: Secondary | ICD-10-CM

## 2012-08-13 LAB — HEMOGLOBIN A1C: Hgb A1c MFr Bld: 7.1 % — ABNORMAL HIGH (ref 4.6–6.5)

## 2012-08-13 LAB — LIPID PANEL
Cholesterol: 127 mg/dL (ref 0–200)
HDL: 31.3 mg/dL — ABNORMAL LOW (ref >39.00)
VLDL: 44.2 mg/dL — ABNORMAL HIGH (ref 0.0–40.0)

## 2012-08-13 LAB — COMPREHENSIVE METABOLIC PANEL
AST: 17 U/L (ref 0–37)
Alkaline Phosphatase: 60 U/L (ref 39–117)
BUN: 13 mg/dL (ref 6–23)
Glucose, Bld: 142 mg/dL — ABNORMAL HIGH (ref 70–99)
Sodium: 138 mEq/L (ref 135–145)
Total Bilirubin: 0.6 mg/dL (ref 0.3–1.2)

## 2012-08-21 ENCOUNTER — Encounter: Payer: Self-pay | Admitting: Family Medicine

## 2012-08-21 ENCOUNTER — Ambulatory Visit (INDEPENDENT_AMBULATORY_CARE_PROVIDER_SITE_OTHER): Payer: Medicare Other | Admitting: Family Medicine

## 2012-08-21 VITALS — BP 138/82 | HR 79 | Temp 97.4°F

## 2012-08-21 DIAGNOSIS — E119 Type 2 diabetes mellitus without complications: Secondary | ICD-10-CM

## 2012-08-21 DIAGNOSIS — E785 Hyperlipidemia, unspecified: Secondary | ICD-10-CM

## 2012-08-21 DIAGNOSIS — I10 Essential (primary) hypertension: Secondary | ICD-10-CM

## 2012-08-21 NOTE — Assessment & Plan Note (Signed)
Stable but trig up and HDL is slt low Disc goals for lipids and reasons to control them Rev labs with pt Rev low sat fat diet in detail

## 2012-08-21 NOTE — Patient Instructions (Addendum)
Keep working on weight loss and better diet  Add exercise - work up to 5 days per week  No medicine changes Follow up in 6 months for annual exam with labs prior

## 2012-08-21 NOTE — Progress Notes (Signed)
Subjective:    Patient ID: Ryan Morgan, male    DOB: 10/24/1940, 72 y.o.   MRN: [id number]  HPI Here for f/u of chronic medical problems   Has poison ivy currently Has taken 15 mg of prednisone in the past month- that helped a bit   Has lost some weight on his scale - about 8 lbs Diet is good - going to his wife's diabetic class and he does the cooking - made some big change in food choices and timing   Has been exercising - outdoor work , not treadmill- because it is in the shop without heat - that has been difficult    bp is stable today  No cp or palpitations or headaches or edema  No side effects to medicines  BP Readings from Last 3 Encounters:  08/21/12 138/82  01/24/12 130/82  10/19/11 132/70      Diabetes Home sugar results - has been checking at different times 2 hours pp 170s , 140s for fasting  DM diet - is much better  Exercise - will start 5 days per d Symptoms- none  A1C last  Lab Results  Component Value Date   HGBA1C 7.1* 08/13/2012   This is down from 7.9 No problems with medications  Renal protection- cannot have ace due to lung issues/ cough  Last eye exam - yesterday - was ok    Hyperlipidemia On lipitor Lab Results  Component Value Date   CHOL 127 08/13/2012   CHOL 124 10/17/2011   CHOL 191 07/11/2011   Lab Results  Component Value Date   HDL 31.30* 08/13/2012   HDL 36.40* 10/17/2011   HDL 35.10* 07/11/2011   Lab Results  Component Value Date   LDLCALC 57 10/17/2011   LDLCALC 128* 07/11/2011   LDLCALC 45 12/28/2010   Lab Results  Component Value Date   TRIG 221.0* 08/13/2012   TRIG 151.0* 10/17/2011   TRIG 139.0 07/11/2011   Lab Results  Component Value Date   CHOLHDL 4 08/13/2012   CHOLHDL 3 10/17/2011   CHOLHDL 5 07/11/2011   Lab Results  Component Value Date   LDLDIRECT 65.7 08/13/2012     Review of Systems Review of Systems  Constitutional: Negative for fever, appetite change, fatigue and unexpected weight change.  Eyes: Negative  for pain and visual disturbance.  Respiratory: Negative for cough and shortness of breath.   Cardiovascular: Negative for cp or palpitations    Gastrointestinal: Negative for nausea, diarrhea and constipation.  Genitourinary: Negative for urgency and frequency.  Skin: Negative for pallor and pos for rash on arm from poison ivy Neurological: Negative for weakness, light-headedness, numbness and headaches.  Hematological: Negative for adenopathy. Does not bruise/bleed easily.  Psychiatric/Behavioral: Negative for dysphoric mood. The patient is not nervous/anxious.         Objective:   Physical Exam  Constitutional: He appears well-developed and well-nourished. No distress.  overwt and well appearing   HENT:  Head: Normocephalic and atraumatic.  Mouth/Throat: Oropharynx is clear and moist.  Eyes: Conjunctivae and EOM are normal. Pupils are equal, round, and reactive to light. Right eye exhibits no discharge. Left eye exhibits no discharge. No scleral icterus.  Neck: Normal range of motion. Neck supple. No JVD present. Carotid bruit is not present. No thyromegaly present.  Cardiovascular: Normal rate, regular rhythm, normal heart sounds and intact distal pulses.  Exam reveals no gallop.   Pulmonary/Chest: Effort normal and breath sounds normal. No respiratory distress. He has no wheezes. He  has no rales.  Abdominal: Soft. Bowel sounds are normal. He exhibits no distension, no abdominal bruit and no mass. There is no tenderness.  Musculoskeletal: Normal range of motion. He exhibits no edema.  Lymphadenopathy:    He has no cervical adenopathy.  Neurological: He is alert. He has normal reflexes. No cranial nerve deficit. He exhibits normal muscle tone. Coordination normal.  Skin: Skin is warm and dry. No rash noted. No erythema. No pallor.  Psychiatric: He has a normal mood and affect.          Assessment & Plan:

## 2012-08-21 NOTE — Assessment & Plan Note (Signed)
Doing better after a DM class- diet is much better and he lost 8 lb  Disc adding exercise-opt 5 d per week  a1c is down to 7.1- disc goals Cannot have ace opthy utd  Nl foot exam F/u 6 mo

## 2012-08-21 NOTE — Assessment & Plan Note (Signed)
bp in fair control at this time  No changes needed  Disc lifstyle change with low sodium diet and exercise   Rev labs today

## 2012-08-24 ENCOUNTER — Encounter: Payer: Self-pay | Admitting: Family Medicine

## 2012-09-10 ENCOUNTER — Other Ambulatory Visit: Payer: Self-pay | Admitting: *Deleted

## 2012-09-10 MED ORDER — SPIRONOLACTONE 25 MG PO TABS: 25.0000 mg | ORAL_TABLET | Freq: Every day | ORAL | Status: DC

## 2012-12-12 ENCOUNTER — Other Ambulatory Visit: Payer: Self-pay | Admitting: *Deleted

## 2012-12-12 MED ORDER — TELMISARTAN 80 MG PO TABS: 80.0000 mg | ORAL_TABLET | Freq: Every day | ORAL | Status: DC

## 2012-12-18 ENCOUNTER — Other Ambulatory Visit: Payer: Self-pay | Admitting: *Deleted

## 2012-12-18 MED ORDER — AMLODIPINE BESYLATE 5 MG PO TABS: 5.0000 mg | ORAL_TABLET | Freq: Every day | ORAL | Status: DC

## 2012-12-18 MED ORDER — ATORVASTATIN CALCIUM 40 MG PO TABS: 40.0000 mg | ORAL_TABLET | Freq: Every day | ORAL | Status: DC

## 2012-12-25 ENCOUNTER — Encounter: Payer: Self-pay | Admitting: Pulmonary Disease

## 2012-12-25 ENCOUNTER — Ambulatory Visit (INDEPENDENT_AMBULATORY_CARE_PROVIDER_SITE_OTHER): Payer: Medicare Other | Admitting: Pulmonary Disease

## 2012-12-25 VITALS — BP 142/72 | HR 98 | Temp 98.2°F | Ht 71.0 in | Wt 248.0 lb

## 2012-12-25 DIAGNOSIS — M3130 Wegener's granulomatosis without renal involvement: Secondary | ICD-10-CM

## 2012-12-25 NOTE — Progress Notes (Signed)
Subjective:    Patient ID: Ryan Morgan, male    DOB: 27-Apr-1941, 72 y.o.   MRN: [id number]  Synopsis: Ryan Morgan was diagnosed with Wegener's granulomatosis in 1997 with joint pain, sinus disease. History with Cytoxan through 1999 for about 2 years total. Since then he had been treated off and on with prednisone. He states that he took prednisone nearly continuously for about 10 years. There is discrepancy in the notes, he feels that he did have significant respiratory symptoms related to Wegener's granulomatosis at 1 point and was treated with IV Solu-Medrol and hospital for this.  HPI  12/25/2012 acute visit>> Ryan Morgan comes to clinic today for the first time in over 18 months. He states that in the last year he history did himself with prednisone once or twice for ear congestion or joint aches which she felt was due to his Wegener's. However, last night he developed severe joint pain and the PIP joints in his hands bilaterally as well as his right knee as well as ear congestion. He took 30 mg of prednisone and now he has had resolution of the ear symptom as well as the knee pain and he has very slight PIP joint pain at this point. He wanted to come in today because he says "I need an ANCA test". He denies shortness of breath or any new respiratory symptoms.  Past Medical History  Diagnosis Date  . Diabetes mellitus   . Hypertension   . Heart disease   . Hyperlipidemia     Review of Systems  Constitutional: Negative for fever, chills and fatigue.  HENT: Positive for ear pain. Negative for congestion, rhinorrhea and postnasal drip.   Respiratory: Positive for cough. Negative for shortness of breath and wheezing.   Cardiovascular: Negative for chest pain, palpitations and leg swelling.  Musculoskeletal: Positive for joint swelling and arthralgias.       Objective:   Physical Exam  Filed Vitals:   12/25/12 1632  BP: 142/72  Pulse: 98  Temp: 98.2 F (36.8 C)  TempSrc: Oral   Height: 5\' 11"  (1.803 m)  Weight: 248 lb (112.492 kg)  SpO2: 96%   Gen: well appearing, no acute distress HEENT: NCAT, EOMi, OP clear, R TM> some clear fluid behind ear drum, no redness, swelling; L TM> cannot see due to wax PULM: CTA B CV: RRR, no mgr, no JVD AB: BS+, soft, nontender, no hsm MSK: slight redness, serositis R PIP joints R hand all fingers, no tenderness Derm: no rash or skin breakdown      Assessment & Plan:   Wegener's granulomatosis Ryan Morgan feels that he is having a flare of his Wegener's granulomatosis which I do not disagree with. Unfortunately, because he has not come to clinic in the last year and a half it is difficult for me to grade how severe this attack is compared to prior events.  I am encouraged by the fact that his symptoms seem to be improving after 30 mg of prednisone and he has no respiratory symptoms.   Plan: -Check c-ANCA titer Check basic metabolic panel -Check urinalysis -Prednisone 20 mg x3 days, then 10 mg by mouth daily x3 days -Educated at length that if symptoms worsen to call me immediately, or if no improvement after 6 days of prednisone to let us know -Instructed him that it was mandatory that he come back for another visit in 4 weeks. At that point we will repeat a c-ANCA   Updated Medication List Outpatient Encounter  Prescriptions as of 12/25/2012  Medication Sig Dispense Refill  . amLODipine (NORVASC) 5 MG tablet Take 1 tablet (5 mg total) by mouth daily.  90 tablet  1  . aspirin 81 MG tablet Take 160 mg by mouth daily.        Marland Kitchen atorvastatin (LIPITOR) 40 MG tablet Take 1 tablet (40 mg total) by mouth daily.  90 tablet  1  . clotrimazole (LOTRIMIN) 1 % cream Mix with and use with Elocon substitute      . fexofenadine (ALLEGRA) 180 MG tablet Take 180 mg by mouth daily.        Marland Kitchen glimepiride (AMARYL) 4 MG tablet Take 1 tablet (4 mg total) by mouth daily before breakfast.  90 tablet  1  . metFORMIN (GLUCOPHAGE) 1000 MG tablet Take  1 tablet (1,000 mg total) by mouth 2 (two) times daily.  180 tablet  1  . mometasone (NASONEX) 50 MCG/ACT nasal spray Place 1 spray into the nose 2 (two) times daily.        Marland Kitchen olopatadine (PATANOL) 0.1 % ophthalmic solution Apply 1 drop to eye 2 (two) times daily.       . predniSONE (DELTASONE) 10 MG tablet Take 10 mg by mouth daily.      Marland Kitchen spironolactone (ALDACTONE) 25 MG tablet Take 1 tablet (25 mg total) by mouth daily.  90 tablet  1  . telmisartan (MICARDIS) 80 MG tablet Take 1 tablet (80 mg total) by mouth daily.  90 tablet  1  . [DISCONTINUED] Clobetasol Prop Emollient Base (CLOBETASOL PROPIONATE E) 0.05 % emollient cream Apply 1 application topically 2 (two) times daily.      . [DISCONTINUED] neomycin-polymyxin-dexameth (MAXITROL) 0.1 % OINT Place 1 application into both eyes at bedtime.       No facility-administered encounter medications on file as of 12/25/2012.

## 2012-12-25 NOTE — Assessment & Plan Note (Signed)
Ryan Morgan feels that he is having a flare of his Wegener's granulomatosis which I do not disagree with. Unfortunately, because he has not come to clinic in the last year and a half it is difficult for me to grade how severe this attack is compared to prior events.  I am encouraged by the fact that his symptoms seem to be improving after 30 mg of prednisone and he has no respiratory symptoms.   Plan: -Check c-ANCA titer Check basic metabolic panel -Check urinalysis -Prednisone 20 mg x3 days, then 10 mg by mouth daily x3 days -Educated at length that if symptoms worsen to call me immediately, or if no improvement after 6 days of prednisone to let us know -Instructed him that it was mandatory that he come back for another visit in 4 weeks. At that point we will repeat a c-ANCA

## 2012-12-25 NOTE — Patient Instructions (Signed)
Take 20mg  prednisone for three days, then 10mg  daily for three days If your symptoms get worse or if you are not better in 6 days, let us know

## 2012-12-26 ENCOUNTER — Telehealth: Payer: Self-pay | Admitting: Pulmonary Disease

## 2012-12-26 ENCOUNTER — Other Ambulatory Visit: Payer: Self-pay | Admitting: Pulmonary Disease

## 2012-12-26 ENCOUNTER — Other Ambulatory Visit (INDEPENDENT_AMBULATORY_CARE_PROVIDER_SITE_OTHER): Payer: Medicare Other

## 2012-12-26 DIAGNOSIS — M3130 Wegener's granulomatosis without renal involvement: Secondary | ICD-10-CM

## 2012-12-26 LAB — URINALYSIS
Bilirubin Urine: NEGATIVE
Ketones, ur: NEGATIVE
Total Protein, Urine: NEGATIVE
Urine Glucose: NEGATIVE
Urobilinogen, UA: 0.2 (ref 0.0–1.0)

## 2012-12-26 NOTE — Telephone Encounter (Signed)
Pt is aware that BQ in fact did want this test done. I apologized for the inconvenience of having to go back and have this test drawn. He accepted and stated that he had to go back on that side of town today any way and didn't mind.

## 2012-12-26 NOTE — Addendum Note (Signed)
Addended by: Montine Circle D on: 12/26/2012 09:26 AM   Modules accepted: Orders

## 2012-12-26 NOTE — Addendum Note (Signed)
Addended by: Montine Circle D on: 12/26/2012 11:06 AM   Modules accepted: Orders

## 2012-12-27 LAB — BASIC METABOLIC PANEL
BUN: 18 mg/dL (ref 6–23)
CO2: 26 mEq/L (ref 19–32)
Calcium: 9.1 mg/dL (ref 8.4–10.5)
Creat: 0.89 mg/dL (ref 0.50–1.35)
Glucose, Bld: 174 mg/dL — ABNORMAL HIGH (ref 70–99)
Sodium: 140 mEq/L (ref 135–145)

## 2012-12-31 LAB — ANCA TITERS: C-ANCA: 1:40 {titer} — ABNORMAL HIGH

## 2012-12-31 LAB — ANCA SCREEN W REFLEX TITER: c-ANCA Screen: POSITIVE — AB

## 2013-01-04 NOTE — Progress Notes (Signed)
Quick Note:  LMTCB ______ 

## 2013-01-07 ENCOUNTER — Telehealth: Payer: Self-pay | Admitting: Pulmonary Disease

## 2013-01-07 ENCOUNTER — Other Ambulatory Visit: Payer: Self-pay | Admitting: *Deleted

## 2013-01-07 MED ORDER — METFORMIN HCL 1000 MG PO TABS: 1000.0000 mg | ORAL_TABLET | Freq: Two times a day (BID) | ORAL | Status: DC

## 2013-01-07 MED ORDER — GLIMEPIRIDE 4 MG PO TABS: 4.0000 mg | ORAL_TABLET | Freq: Every day | ORAL | Status: DC

## 2013-01-07 NOTE — Progress Notes (Signed)
Quick Note:  Spoke with pt. Informed him of lab results and recs per Dr. Kendrick Fries. He verbalized understanding. ______

## 2013-01-07 NOTE — Telephone Encounter (Signed)
Verlon Au was calling to inform pt:  Notes Recorded by Lupita Leash, MD on 12/27/2012 at 7:07 PM L,  Please let him know that his kidney function was normal. We still don't have the results of the C-ANCA titer but there is no reason to change the plan he and I laid out.  Thanks, B  -------  Called, spoke with pt.  Informed him of above lab results and recs per Dr. Kendrick Fries.  He verbalized understanding and voiced no further questions or concerns at this time.

## 2013-01-18 LAB — HM DIABETES EYE EXAM

## 2013-01-25 ENCOUNTER — Telehealth: Payer: Self-pay | Admitting: Pulmonary Disease

## 2013-01-25 NOTE — Telephone Encounter (Signed)
I do not see any resulted labs for this in EPIC. Pt is aware will get the message to see if he has seen any results on pt. Please advise thanks

## 2013-01-27 NOTE — Telephone Encounter (Signed)
His titer was 1:40 which is the lowest level detectable

## 2013-01-29 NOTE — Telephone Encounter (Signed)
Pt would like further explanation of results and I am unfamiliar with this. thanks

## 2013-01-29 NOTE — Telephone Encounter (Signed)
lmomtcb x1 for pt 

## 2013-01-29 NOTE — Telephone Encounter (Signed)
CAlled patient, questions answered Please schedule a follow up appointment with me for 1-2 months

## 2013-01-30 NOTE — Telephone Encounter (Signed)
Spoke with pt and given appt with Dr Kendrick Fries on 03/05/2013

## 2013-02-11 ENCOUNTER — Other Ambulatory Visit: Payer: Self-pay | Admitting: *Deleted

## 2013-02-11 NOTE — Telephone Encounter (Signed)
Go ahead and override that and refill for 6 mo , thanks

## 2013-02-11 NOTE — Telephone Encounter (Signed)
Received faxed refill request from pharmacy. Last office visit 08/21/12. See current medication warning. Is it okay to refill medication?

## 2013-02-12 MED ORDER — SPIRONOLACTONE 25 MG PO TABS: 25.0000 mg | ORAL_TABLET | Freq: Every day | ORAL | Status: DC

## 2013-02-12 NOTE — Telephone Encounter (Signed)
done

## 2013-02-25 ENCOUNTER — Telehealth: Payer: Self-pay | Admitting: Family Medicine

## 2013-02-25 DIAGNOSIS — E119 Type 2 diabetes mellitus without complications: Secondary | ICD-10-CM

## 2013-02-25 DIAGNOSIS — I10 Essential (primary) hypertension: Secondary | ICD-10-CM

## 2013-02-25 DIAGNOSIS — Z125 Encounter for screening for malignant neoplasm of prostate: Secondary | ICD-10-CM

## 2013-02-25 NOTE — Telephone Encounter (Signed)
Message copied by [name] on Mon Feb 25, 2013  9:46 PM ------      Message from: Alvina Chou      Created: Mon Feb 25, 2013 10:34 AM      Regarding: Lab orders for Tuesday, 9.30.14       Patient is scheduled for CPX labs, please order future labs, Thanks , Terri       ------

## 2013-02-26 ENCOUNTER — Other Ambulatory Visit (INDEPENDENT_AMBULATORY_CARE_PROVIDER_SITE_OTHER): Payer: Medicare Other

## 2013-02-26 ENCOUNTER — Telehealth: Payer: Self-pay | Admitting: Family Medicine

## 2013-02-26 DIAGNOSIS — I10 Essential (primary) hypertension: Secondary | ICD-10-CM

## 2013-02-26 DIAGNOSIS — E119 Type 2 diabetes mellitus without complications: Secondary | ICD-10-CM

## 2013-02-26 DIAGNOSIS — Z125 Encounter for screening for malignant neoplasm of prostate: Secondary | ICD-10-CM

## 2013-02-26 DIAGNOSIS — E785 Hyperlipidemia, unspecified: Secondary | ICD-10-CM

## 2013-02-26 LAB — COMPREHENSIVE METABOLIC PANEL
Alkaline Phosphatase: 57 U/L (ref 39–117)
BUN: 15 mg/dL (ref 6–23)
CO2: 27 mEq/L (ref 19–32)
Creatinine, Ser: 0.8 mg/dL (ref 0.4–1.5)
GFR: 101.41 mL/min (ref >60.00)
Glucose, Bld: 123 mg/dL — ABNORMAL HIGH (ref 70–99)
Potassium: 4.4 mEq/L (ref 3.5–5.1)
Total Bilirubin: 0.5 mg/dL (ref 0.3–1.2)
Total Protein: 6.5 g/dL (ref 6.0–8.3)

## 2013-02-26 LAB — LIPID PANEL
Cholesterol: 131 mg/dL (ref 0–200)
HDL: 35.3 mg/dL — ABNORMAL LOW (ref >39.00)
Total CHOL/HDL Ratio: 4

## 2013-02-26 LAB — TSH: TSH: 1.95 u[IU]/mL (ref 0.35–5.50)

## 2013-02-26 LAB — CBC WITH DIFFERENTIAL/PLATELET
Basophils Absolute: 0 10*3/uL (ref 0.0–0.1)
Eosinophils Absolute: 0.1 10*3/uL (ref 0.0–0.7)
Lymphocytes Relative: 28.4 % (ref 12.0–46.0)
MCHC: 34.1 g/dL (ref 30.0–36.0)
Monocytes Relative: 8.7 % (ref 3.0–12.0)
Neutrophils Relative %: 60.6 % (ref 43.0–77.0)
RDW: 13.6 % (ref 11.5–14.6)

## 2013-02-26 LAB — HEMOGLOBIN A1C: Hgb A1c MFr Bld: 7.4 % — ABNORMAL HIGH (ref 4.6–6.5)

## 2013-02-26 NOTE — Telephone Encounter (Signed)
Message copied by [name] on Tue Feb 26, 2013  8:03 AM ------      Message from: Alvina Chou      Created: Tue Feb 19, 2013  4:15 PM      Regarding: Lab orders for Wednesday, 10.1.14       Patient is scheduled for CPX labs, please order future labs, Thanks , Ryan Morgan       ------

## 2013-02-27 ENCOUNTER — Other Ambulatory Visit: Payer: Medicare Other

## 2013-03-05 ENCOUNTER — Encounter: Payer: Self-pay | Admitting: Pulmonary Disease

## 2013-03-05 ENCOUNTER — Ambulatory Visit (INDEPENDENT_AMBULATORY_CARE_PROVIDER_SITE_OTHER): Payer: Medicare Other | Admitting: Family Medicine

## 2013-03-05 ENCOUNTER — Ambulatory Visit (INDEPENDENT_AMBULATORY_CARE_PROVIDER_SITE_OTHER): Payer: Medicare Other | Admitting: Pulmonary Disease

## 2013-03-05 ENCOUNTER — Encounter: Payer: Self-pay | Admitting: Family Medicine

## 2013-03-05 VITALS — BP 150/96 | HR 74 | Ht 71.0 in | Wt 246.0 lb

## 2013-03-05 VITALS — BP 130/76 | HR 73 | Temp 98.4°F | Ht 70.0 in | Wt 249.0 lb

## 2013-03-05 DIAGNOSIS — E785 Hyperlipidemia, unspecified: Secondary | ICD-10-CM

## 2013-03-05 DIAGNOSIS — M8580 Other specified disorders of bone density and structure, unspecified site: Secondary | ICD-10-CM

## 2013-03-05 DIAGNOSIS — Z125 Encounter for screening for malignant neoplasm of prostate: Secondary | ICD-10-CM

## 2013-03-05 DIAGNOSIS — E119 Type 2 diabetes mellitus without complications: Secondary | ICD-10-CM

## 2013-03-05 DIAGNOSIS — M899 Disorder of bone, unspecified: Secondary | ICD-10-CM

## 2013-03-05 DIAGNOSIS — Z0000 Encounter for general adult medical examination without abnormal findings: Secondary | ICD-10-CM | POA: Insufficient documentation

## 2013-03-05 DIAGNOSIS — M3130 Wegener's granulomatosis without renal involvement: Secondary | ICD-10-CM

## 2013-03-05 DIAGNOSIS — T380X5S Adverse effect of glucocorticoids and synthetic analogues, sequela: Secondary | ICD-10-CM

## 2013-03-05 DIAGNOSIS — T380X5A Adverse effect of glucocorticoids and synthetic analogues, initial encounter: Secondary | ICD-10-CM | POA: Insufficient documentation

## 2013-03-05 DIAGNOSIS — I10 Essential (primary) hypertension: Secondary | ICD-10-CM

## 2013-03-05 DIAGNOSIS — Z23 Encounter for immunization: Secondary | ICD-10-CM

## 2013-03-05 DIAGNOSIS — E669 Obesity, unspecified: Secondary | ICD-10-CM

## 2013-03-05 NOTE — Assessment & Plan Note (Signed)
Fortunately Ryan Morgan has done well since the last visit and has not had to take more prednisone.  His Wegener's has been relatively quiescent in recent years despite occassional mild flares.  I do not believe that there is a clinical correllation with his ANCA titer and his symptoms.   Plan: -ANCA test today while well to see what his baseline titer is -Prednisone Rx given incase he needs it for a flare -f/u with [place] in 11/2012 -flu shot given today

## 2013-03-05 NOTE — Progress Notes (Signed)
Subjective:    Patient ID: Ryan Morgan, male    DOB: 1940-11-16, 72 y.o.   MRN: [id number]  HPI I have personally reviewed the Medicare Annual Wellness questionnaire and have noted 1. The patient's medical and social history 2. Their use of alcohol, tobacco or illicit drugs 3. Their current medications and supplements 4. The patient's functional ability including ADL's, fall risks, home safety risks and hearing or visual             impairment. 5. Diet and physical activities 6. Evidence for depression or mood disorders  The patients weight, height, BMI have been recorded in the chart and visual acuity is per eye clinic.  I have made referrals, counseling and provided education to the patient based review of the above and I have provided the pt with a written personalized care plan for preventive services.  Is feeling well   See scanned forms.  Routine anticipatory guidance given to patient.  See health maintenance. Flu vaccine-had one this am  Shingles- declines the vaccine  PNA 2/13  Vaccine  Tetanus 1/10 vaccine  Colon 1/ 08 - with a 10 year recall  Prostate cancer screening - psa is low and stable  Nocturia times one - no problems with urine stream  Advance directive - does not have a living will  Cognitive function addressed- see scanned forms- and if abnormal then additional documentation follows. 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  Wegner's - is very stable with that (no prednisone since last pulm visit)- visit today was good   Mood is good   Diabetes Wt is up 3-4 lb Home sugar results  DM diet - he has been eating a lot of peaches  Exercise - treadmill and a lot of yardwork- at least 6 d per week  Symptoms-none  A1C last  Lab Results  Component Value Date   HGBA1C 7.4* 02/26/2013    No problems with medications  Renal protection on ARB Last eye exam 3/14    Prostate screen Lab  Results  Component Value Date   PSA 0.61 02/26/2013   PSA 1.08 07/11/2011    Lab Results  Component Value Date   CHOL 131 02/26/2013   CHOL 127 08/13/2012   CHOL 124 10/17/2011   Lab Results  Component Value Date   HDL 35.30* 02/26/2013   HDL 31.30* 08/13/2012   HDL 36.40* 10/17/2011   Lab Results  Component Value Date   LDLCALC 57 02/26/2013   LDLCALC 57 10/17/2011   LDLCALC 128* 07/11/2011   Lab Results  Component Value Date   TRIG 195.0* 02/26/2013   TRIG 221.0* 08/13/2012   TRIG 151.0* 10/17/2011   Lab Results  Component Value Date   CHOLHDL 4 02/26/2013   CHOLHDL 4 08/13/2012   CHOLHDL 3 10/17/2011   Lab Results  Component Value Date   LDLDIRECT 65.7 08/13/2012  trig may be from sugar     PMH and SH reviewed  Meds, vitals, and allergies reviewed.   ROS: See HPI.  Otherwise negative.     Patient Active Problem List   Diagnosis Date Noted  . Encounter for Medicare annual wellness exam 03/05/2013  . Obesity 07/15/2011  . Prostate cancer screening 07/10/2011  . Blunt head trauma 07/04/2011  . Headache on top of head 06/29/2011  . Cough 12/23/2010  . Hypertension 08/30/2010  . Hyperlipemia 08/30/2010  . Diabetes  mellitus, type 2 08/30/2010  . 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 Diabetes mellitus   . Hypertension   . Heart disease   . Hyperlipidemia   . Wegener's granulomatosis 2007   Past Surgical History  Procedure Laterality Date  . Basal cell carcinoma excision      removed from face x 3  . 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 Cardiac surgery  2000    stent in heart   History  Substance Use Topics  . Smoking status: Former Smoker -- 0.50 packs/day for 20 years    Types: Cigarettes    Quit date: 05/30/1978  . Smokeless tobacco: Never Used  . Alcohol Use: No   Family History  Problem Relation Age of Onset  . Cancer Mother     tumor on tonsil  . Stroke Maternal Grandfather     Allergies  Allergen Reactions  . Benicar Hct [Olmesartan Medoxomil-Hctz]     Painful and itchy knots on palms of hands  . Bystolic [Nebivolol Hcl]     headache  . Niaspan [Niacin Er] Other (See Comments)    flushing  . Septra [Bactrim] Hives   Current Outpatient Prescriptions on File Prior to Visit  Medication Sig Dispense Refill  . amLODipine (NORVASC) 5 MG tablet Take 1 tablet (5 mg total) by mouth daily.  90 tablet  1  . aspirin 81 MG tablet Take 160 mg by mouth daily.        Marland Kitchen atorvastatin (LIPITOR) 40 MG tablet Take 1 tablet (40 mg total) by mouth daily.  90 tablet  1  . clotrimazole (LOTRIMIN) 1 % cream Mix with and use with Elocon  AS NEEDED      . fexofenadine (ALLEGRA) 180 MG tablet Take 180 mg by mouth as needed.       Marland Kitchen glimepiride (AMARYL) 4 MG tablet Take 1 tablet (4 mg total) by mouth daily before breakfast.  90 tablet  1  . metFORMIN (GLUCOPHAGE) 1000 MG tablet Take 1 tablet (1,000 mg total) by mouth 2 (two) times daily.  180 tablet  1  . predniSONE (DELTASONE) 10 MG tablet Take 10 mg by mouth daily.      Marland Kitchen spironolactone (ALDACTONE) 25 MG tablet Take 1 tablet (25 mg total) by mouth daily.  90 tablet  1  . telmisartan (MICARDIS) 80 MG tablet Take 1 tablet (80 mg total) by mouth daily.  90 tablet  1  . [DISCONTINUED] nebivolol (BYSTOLIC) 5 MG tablet Take 1 tablet (5 mg total) by mouth 2 (two) times daily.  180 tablet  3   No current facility-administered medications on file prior to visit.    Review of Systems Review of Systems  Constitutional: Negative for fever, appetite change, fatigue and unexpected weight change.  Eyes: Negative for pain and visual disturbance.  Respiratory: Negative for cough and shortness of breath.   Cardiovascular: Negative for cp or palpitations    Gastrointestinal: Negative for nausea, diarrhea and constipation.  Genitourinary: Negative for urgency and frequency.  Skin: Negative for pallor or rash   Neurological: Negative for weakness,  light-headedness, numbness and headaches.  Hematological: Negative for adenopathy. Does not bruise/bleed easily.  Psychiatric/Behavioral: Negative for dysphoric mood. The patient is not nervous/anxious.         Objective:   Physical Exam  Constitutional: He appears well-developed and well-nourished. No distress.  obese and well appearing   HENT:  Head:  Normocephalic and atraumatic.  Right Ear: External ear normal.  Left Ear: External ear normal.  Nose: Nose normal.  Mouth/Throat: Oropharynx is clear and moist.  Eyes: Conjunctivae and EOM are normal. Pupils are equal, round, and reactive to light. Right eye exhibits no discharge. Left eye exhibits no discharge. No scleral icterus.  Neck: Normal range of motion. Neck supple. No JVD present. Carotid bruit is not present. No thyromegaly present.  Cardiovascular: Normal rate, regular rhythm and normal heart sounds.  Exam reveals no gallop.   Pulmonary/Chest: Effort normal and breath sounds normal. No respiratory distress. He has no wheezes. He exhibits no tenderness.  Abdominal: Soft. Bowel sounds are normal. He exhibits no distension, no abdominal bruit and no mass. There is no tenderness.  Musculoskeletal: He exhibits no edema and no tenderness.  Lymphadenopathy:    He has no cervical adenopathy.  Neurological: He is alert. He has normal reflexes. He exhibits normal muscle tone. Coordination normal.  Skin: Skin is warm and dry. No rash noted. No erythema. No pallor.  Psychiatric: He has a normal mood and affect.          Assessment & Plan:

## 2013-03-05 NOTE — Patient Instructions (Addendum)
Think about working on a living will  Stop up front for a referral for a bone density test  Stop up front for referral to endocrinology for your diabetes I'm glad you had your flu shot  Keep working on healthy diet and exercise

## 2013-03-05 NOTE — Progress Notes (Signed)
Subjective:    Patient ID: Ryan Morgan, male    DOB: Aug 13, 1940, 72 y.o.   MRN: [id number]  Synopsis: [name] was diagnosed with Wegener's granulomatosis in 1997 with joint pain, sinus disease. History with Cytoxan through 1999 for about 2 years total. Since then he had been treated off and on with prednisone. He states that he took prednisone nearly continuously for about 10 years. There is discrepancy in the notes, he feels that he did have significant respiratory symptoms related to Wegener's granulomatosis at 1 point and was treated with IV Solu-Medrol and hospital for this.  HPI   12/25/2012 acute visit>> Mr. Hy comes to clinic today for the first time in over 18 months. He states that in the last year he history did himself with prednisone once or twice for ear congestion or joint aches which she felt was due to his Wegener's. However, last night he developed severe joint pain and the PIP joints in his hands bilaterally as well as his right knee as well as ear congestion. He took 30 mg of prednisone and now he has had resolution of the ear symptom as well as the knee pain and he has very slight PIP joint pain at this point. He wanted to come in today because he says "I need an ANCA test". He denies shortness of breath or any new respiratory symptoms.  03/05/2013 ROV >> Rayshun has been having heel pain lately in the mornings when he gets up and moves around.  It typically gets better throughout the day.  Otherwise, no joint aches at all.  No sinus or ear symptoms, and no respiratory problems.  He has not had fevers or chills.    Past Medical History  Diagnosis Date  . Diabetes mellitus   . Hypertension   . Heart disease   . Hyperlipidemia   . Wegener's granulomatosis 2007    Review of Systems  Constitutional: Negative for fever, chills and fatigue.  HENT: Positive for ear pain. Negative for congestion, rhinorrhea and postnasal drip.   Respiratory: Positive for cough. Negative for  shortness of breath and wheezing.   Cardiovascular: Negative for chest pain, palpitations and leg swelling.  Musculoskeletal: Positive for joint swelling and arthralgias.       Objective:   Physical Exam   Filed Vitals:   03/05/13 0936  BP: 150/96  Pulse: 74  Height: 5\' 11"  (1.803 m)  Weight: 246 lb (111.585 kg)  SpO2: 95%   Gen: well appearing, no acute distress HEENT: NCAT, EOMi, OP clear, TM's are clear bilaterally PULM: CTA B CV: RRR, no mgr, no JVD AB: BS+, soft, nontender, no hsm MSK: PIP, DIP, wrist, elbows, knees bilaterally are normal without redness or swelling Derm: no rash or skin breakdown      Assessment & Plan:   Wegener's granulomatosis Fortunately Jamonta has done well since the last visit and has not had to take more prednisone.  His Wegener's has been relatively quiescent in recent years despite occassional mild flares.  I do not believe that there is a clinical correllation with his ANCA titer and his symptoms.   Plan: -ANCA test today while well to see what his baseline titer is -Prednisone Rx given incase he needs it for a flare -f/u with [place] in 11/2012 -flu shot given today    Updated Medication List Outpatient Encounter Prescriptions as of 03/05/2013  Medication Sig Dispense Refill  . amLODipine (NORVASC) 5 MG tablet Take 1 tablet (5 mg total) by mouth  daily.  90 tablet  1  . aspirin 81 MG tablet Take 160 mg by mouth daily.        Marland Kitchen atorvastatin (LIPITOR) 40 MG tablet Take 1 tablet (40 mg total) by mouth daily.  90 tablet  1  . clotrimazole (LOTRIMIN) 1 % cream Mix with and use with Elocon substitute      . fexofenadine (ALLEGRA) 180 MG tablet Take 180 mg by mouth daily.        Marland Kitchen glimepiride (AMARYL) 4 MG tablet Take 1 tablet (4 mg total) by mouth daily before breakfast.  90 tablet  1  . metFORMIN (GLUCOPHAGE) 1000 MG tablet Take 1 tablet (1,000 mg total) by mouth 2 (two) times daily.  180 tablet  1  . mometasone (NASONEX) 50 MCG/ACT nasal spray  Place 1 spray into the nose 2 (two) times daily.        Marland Kitchen olopatadine (PATANOL) 0.1 % ophthalmic solution Apply 1 drop to eye 2 (two) times daily.       . predniSONE (DELTASONE) 10 MG tablet Take 10 mg by mouth daily.      Marland Kitchen spironolactone (ALDACTONE) 25 MG tablet Take 1 tablet (25 mg total) by mouth daily.  90 tablet  1  . telmisartan (MICARDIS) 80 MG tablet Take 1 tablet (80 mg total) by mouth daily.  90 tablet  1   No facility-administered encounter medications on file as of 03/05/2013.

## 2013-03-05 NOTE — Addendum Note (Signed)
Addended by: Montine Circle D on: 03/05/2013 10:07 AM   Modules accepted: Orders

## 2013-03-05 NOTE — Patient Instructions (Signed)
We will see you back in July 2015 We will call you with the results of the ANCA test today

## 2013-03-06 ENCOUNTER — Encounter: Payer: Medicare Other | Admitting: Family Medicine

## 2013-03-07 ENCOUNTER — Encounter: Payer: Self-pay | Admitting: Pulmonary Disease

## 2013-03-07 LAB — ANCA TITERS: C-ANCA: 1:40 {titer} — ABNORMAL HIGH

## 2013-03-07 LAB — ANCA SCREEN W REFLEX TITER
Atypical p-ANCA Screen: NEGATIVE
c-ANCA Screen: POSITIVE — AB
p-ANCA Screen: NEGATIVE

## 2013-03-11 ENCOUNTER — Ambulatory Visit (INDEPENDENT_AMBULATORY_CARE_PROVIDER_SITE_OTHER)
Admission: RE | Admit: 2013-03-11 | Discharge: 2013-03-11 | Disposition: A | Discharge status: Home / Self Care | Payer: Medicare Other | Source: Ambulatory Visit | Attending: Family Medicine | Admitting: Family Medicine

## 2013-03-11 ENCOUNTER — Telehealth: Payer: Self-pay | Admitting: Pulmonary Disease

## 2013-03-11 DIAGNOSIS — M8580 Other specified disorders of bone density and structure, unspecified site: Secondary | ICD-10-CM

## 2013-03-11 DIAGNOSIS — M899 Disorder of bone, unspecified: Secondary | ICD-10-CM

## 2013-03-11 LAB — HM DEXA SCAN: HM Dexa Scan: NORMAL

## 2013-03-11 NOTE — Telephone Encounter (Signed)
Result Note    L,      Please let him know that his ANCA titer from this week was exactly the same as it was the day he was sick.      Thanks   B      I spoke with patient about results and he verbalized understanding and had no questions

## 2013-03-19 ENCOUNTER — Ambulatory Visit (INDEPENDENT_AMBULATORY_CARE_PROVIDER_SITE_OTHER): Payer: Medicare Other | Admitting: Cardiovascular Disease

## 2013-03-19 ENCOUNTER — Encounter: Payer: Self-pay | Admitting: Cardiovascular Disease

## 2013-03-19 VITALS — BP 140/80 | HR 70 | Ht 70.0 in | Wt 248.5 lb

## 2013-03-19 DIAGNOSIS — I10 Essential (primary) hypertension: Secondary | ICD-10-CM

## 2013-03-19 DIAGNOSIS — I25118 Atherosclerotic heart disease of native coronary artery with other forms of angina pectoris: Secondary | ICD-10-CM | POA: Insufficient documentation

## 2013-03-19 DIAGNOSIS — E785 Hyperlipidemia, unspecified: Secondary | ICD-10-CM

## 2013-03-19 DIAGNOSIS — K3189 Other diseases of stomach and duodenum: Secondary | ICD-10-CM

## 2013-03-19 DIAGNOSIS — E119 Type 2 diabetes mellitus without complications: Secondary | ICD-10-CM

## 2013-03-19 DIAGNOSIS — K30 Functional dyspepsia: Secondary | ICD-10-CM

## 2013-03-19 DIAGNOSIS — I2510 Atherosclerotic heart disease of native coronary artery without angina pectoris: Secondary | ICD-10-CM

## 2013-03-19 NOTE — Assessment & Plan Note (Signed)
We have encouraged continued exercise, careful diet management in an effort to lose weight. Hemoglobin A1c slightly elevated

## 2013-03-19 NOTE — Patient Instructions (Signed)
You are doing well. No medication changes were made.  Please call us if you have new issues that need to be addressed before your next appt.  Your physician wants you to follow-up in: 12 months.  You will receive a reminder letter in the mail two months in advance. If you don't receive a letter, please call our office to schedule the follow-up appointment. 

## 2013-03-19 NOTE — Progress Notes (Signed)
Patient ID: Ryan Morgan, male    DOB: 02-Jun-1940, 72 y.o.   MRN: [id number]  HPI Comments: Ryan Morgan is a very pleasant 72 year old gentleman with a history of coronary artery disease, stent placed to his LAD in 2000, repeat catheterization in January 2003 with 40% LAD, 50% diagonal disease, normal ejection fraction who presents to establish care in our office. He has a long history of Wegener's granulomatosis, diabetes.  Reports that he is doing well. No recent episodes of shortness of breath or chest pain concerning for angina. Previously in 2000, he had a cold chill and did not feel well. Further workup with cardiac enzymes suggested he is having cardiac issues and he had a catheterization.  He reports having a stress test in 2009 in [place] reports was normal. He is active, does some exercise but not irregular exercise program. He's been trying to work on his weight. This continues to be a struggle. Hemoglobin A1c 7.1 by his report. He is tolerating his other medications well. Blood pressures typically well controlled, more elevated today.  He does report having had a recent prednisone pulse for knee pain that caused his sugars to go up.  EKG shows normal sinus rhythm with rate 75 beats per minute with no significant ST or T wave changes  Stress test from January 2009 was a treadmill study, ejection fraction estimated at 45%, with medium-sized area of moderate to severe hypoperfusion involving the septal region.   Outpatient Encounter Prescriptions as of 03/19/2013  Medication Sig Dispense Refill  . amLODipine (NORVASC) 5 MG tablet Take 1 tablet (5 mg total) by mouth daily.  90 tablet  1  . aspirin 81 MG tablet Take 160 mg by mouth daily.        Marland Kitchen atorvastatin (LIPITOR) 40 MG tablet Take 1 tablet (40 mg total) by mouth daily.  90 tablet  1  . clotrimazole (LOTRIMIN) 1 % cream Mix with and use with Elocon  AS NEEDED      . fexofenadine (ALLEGRA) 180 MG tablet Take 180 mg by mouth as  needed.       Marland Kitchen glimepiride (AMARYL) 4 MG tablet Take 1 tablet (4 mg total) by mouth daily before breakfast.  90 tablet  1  . metFORMIN (GLUCOPHAGE) 1000 MG tablet Take 1 tablet (1,000 mg total) by mouth 2 (two) times daily.  180 tablet  1  . predniSONE (DELTASONE) 10 MG tablet Take 10 mg by mouth daily.      Marland Kitchen spironolactone (ALDACTONE) 25 MG tablet Take 1 tablet (25 mg total) by mouth daily.  90 tablet  1  . telmisartan (MICARDIS) 80 MG tablet Take 1 tablet (80 mg total) by mouth daily.  90 tablet  1   No facility-administered encounter medications on file as of 03/19/2013.     Review of Systems  Constitutional: Negative.   HENT: Negative.   Eyes: Negative.   Respiratory: Negative.   Cardiovascular: Negative.   Gastrointestinal: Negative.   Endocrine: Negative.   Musculoskeletal: Negative.   Skin: Negative.   Allergic/Immunologic: Negative.   Neurological: Negative.   Hematological: Negative.   Psychiatric/Behavioral: Negative.   All other systems reviewed and are negative.    BP 140/80  Pulse 70  Ht 5\' 10"  (1.778 m)  Wt 248 lb 8 oz (112.719 kg)  BMI 35.66 kg/m2  Physical Exam  Nursing note and vitals reviewed. Constitutional: He is oriented to person, place, and time. He appears well-developed and well-nourished.  HENT:  Head: Normocephalic.  Nose: Nose normal.  Mouth/Throat: Oropharynx is clear and moist.  Eyes: Conjunctivae are normal. Pupils are equal, round, and reactive to light.  Neck: Normal range of motion. Neck supple. No JVD present.  Cardiovascular: Normal rate, regular rhythm, S1 normal, S2 normal, normal heart sounds and intact distal pulses.  Exam reveals no gallop and no friction rub.   No murmur heard. Pulmonary/Chest: Effort normal and breath sounds normal. No respiratory distress. He has no wheezes. He has no rales. He exhibits no tenderness.  Abdominal: Soft. Bowel sounds are normal. He exhibits no distension. There is no tenderness.   Musculoskeletal: Normal range of motion. He exhibits no edema and no tenderness.  Lymphadenopathy:    He has no cervical adenopathy.  Neurological: He is alert and oriented to person, place, and time. Coordination normal.  Skin: Skin is warm and dry. No rash noted. No erythema.  Psychiatric: He has a normal mood and affect. His behavior is normal. Judgment and thought content normal.      Assessment and Plan

## 2013-03-19 NOTE — Assessment & Plan Note (Signed)
Cholesterol is at goal on the current lipid regimen. No changes to the medications were made.  

## 2013-03-19 NOTE — Assessment & Plan Note (Signed)
Currently with no symptoms of angina. No further workup at this time. Continue current medication regimen. 

## 2013-03-19 NOTE — Assessment & Plan Note (Signed)
Blood pressure is well controlled on today's visit. No changes made to the medications. 

## 2013-03-20 ENCOUNTER — Ambulatory Visit (INDEPENDENT_AMBULATORY_CARE_PROVIDER_SITE_OTHER): Payer: Medicare Other | Admitting: Internal Medicine

## 2013-03-20 ENCOUNTER — Encounter: Payer: Self-pay | Admitting: Internal Medicine

## 2013-03-20 ENCOUNTER — Telehealth: Payer: Self-pay | Admitting: Internal Medicine

## 2013-03-20 ENCOUNTER — Other Ambulatory Visit: Payer: Self-pay | Admitting: *Deleted

## 2013-03-20 VITALS — BP 132/70 | HR 68 | Temp 98.2°F | Resp 10 | Ht 71.0 in | Wt 249.8 lb

## 2013-03-20 DIAGNOSIS — E119 Type 2 diabetes mellitus without complications: Secondary | ICD-10-CM

## 2013-03-20 MED ORDER — RELION ULTRA THIN LANCETS 30G MISC: Status: DC

## 2013-03-20 MED ORDER — BLOOD GLUCOSE MONITOR KIT: PACK | Status: DC

## 2013-03-20 MED ORDER — GLUCOSE BLOOD VI STRP: ORAL_STRIP | Status: DC

## 2013-03-20 NOTE — Patient Instructions (Signed)
Please continue Metformin 1000 mg 2x a day. Start checking your sugars 1-2x a day, rotating check times and writes the values down.  Use a ReliOn glucometer - I sent prescriptions for the meter, lancets and strips to your pharmacy. Please return in 1 month with your sugar log.   PATIENT INSTRUCTIONS FOR TYPE 2 DIABETES:  DIET AND EXERCISE Diet and exercise is an important part of diabetic treatment.  We recommended aerobic exercise in the form of brisk walking (working between 40-60% of maximal aerobic capacity, similar to brisk walking) for 150 minutes per week (such as 30 minutes five days per week) along with 3 times per week performing 'resistance' training (using various gauge rubber tubes with handles) 5-10 exercises involving the major muscle groups (upper body, lower body and core) performing 10-15 repetitions (or near fatigue) each exercise. Start at half the above goal but build slowly to reach the above goals. If limited by weight, joint pain, or disability, we recommend daily walking in a swimming pool with water up to waist to reduce pressure from joints while allow for adequate exercise.    BLOOD GLUCOSES Monitoring your blood glucoses is important for continued management of your diabetes. Please check your blood glucoses 2-4 times a day: fasting, before meals and at bedtime (you can rotate these measurements - e.g. one day check before the 3 meals, the next day check before 2 of the meals and before bedtime, etc.   HYPOGLYCEMIA (low blood sugar) Hypoglycemia is usually a reaction to not eating, exercising, or taking too much insulin/ other diabetes drugs.  Symptoms include tremors, sweating, hunger, confusion, headache, etc. Treat IMMEDIATELY with 15 grams of Carbs:   4 glucose tablets    cup regular juice/soda   2 tablespoons raisins   4 teaspoons sugar   1 tablespoon honey Recheck blood glucose in 15 mins and repeat above if still symptomatic/blood glucose <100. Please  contact our office at [phone] if you have questions about how to next handle your insulin.  RECOMMENDATIONS TO REDUCE YOUR RISK OF DIABETIC COMPLICATIONS: * Take your prescribed MEDICATION(S). * Follow a DIABETIC diet: Complex carbs, fiber rich foods, heart healthy fish twice weekly, (monounsaturated and polyunsaturated) fats * AVOID saturated/trans fats, high fat foods, >2,300 mg salt per day. * EXERCISE at least 5 times a week for 30 minutes or preferably daily.  * DO NOT SMOKE OR DRINK more than 1 drink a day. * Check your FEET every day. Do not wear tightfitting shoes. Contact us if you develop an ulcer * See your EYE doctor once a year or more if needed * Get a FLU shot once a year * Get a PNEUMONIA vaccine once before and once after age 33 years  GOALS:  * Your Hemoglobin A1c of <7%  * fasting sugars need to be <130 * after meals sugars need to be <180 (2h after you start eating) * Your Systolic BP should be 140 or lower  * Your Diastolic BP should be 80 or lower  * Your HDL (Good Cholesterol) should be 40 or higher  * Your LDL (Bad Cholesterol) should be 100 or lower  * Your Triglycerides should be 150 or lower  * Your Urine microalbumin (kidney function) should be <30 * Your Body Mass Index should be 25 or lower   We will be glad to help you achieve these goals. Our telephone number is: [phone].

## 2013-03-20 NOTE — Progress Notes (Signed)
Patient ID: Ryan Morgan, male   DOB: [birth year], 72 y.o.   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  Patient has been diagnosed with diabetes in 2000, after starting Prednisone; he has not been on insulin before. 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 He takes it prn, usually a taper: starting at 30 mg daily, but usually just takes a 10 mg. Last year he had 2 tapers. Pulmonary: Dr Trish Mage.  Pt is on a regimen of: - Metformin 1000 mg po bid - Amaryl 4 mg in am  Pt checks his sugars 4x a month and they are: - am: 119-144 No lows. Lowest sugar was 119; ? he has hypoglycemia awareness. Highest sugar was 155. One Touch Mini.  Pt's meals are: - Breakfast: Cereals or sausage+ egg+ grips or biscuit and gravy - Lunch: Tomato sandwich - Dinner: Turnips, green beans + Grilled chicken - Snacks: 2-3 a day  - no CKD, last BUN/creatinine:  Lab Results  Component Value Date   BUN 15 02/26/2013   CREATININE 0.8 02/26/2013  He is on telmisartan. - last set of lipids: Lab Results  Component Value Date   CHOL 131 02/26/2013   HDL 35.30* 02/26/2013   LDLCALC 57 02/26/2013   LDLDIRECT 65.7 08/13/2012   TRIG 195.0* 02/26/2013   CHOLHDL 4 02/26/2013  He is on Lipitor. Also on ASA 81.ww - last eye exam was in 12/2012. No DR.  - + numbness and tingling in his toes after his surgery for Wegener dx 2000.  Also has HTN, HL.  No FH of DM.  ROS: Constitutional: no weight gain/loss, no fatigue, + subjective hyperthermia Eyes: no blurry vision, no xerophthalmia ENT: no sore throat, no nodules palpated in throat, no dysphagia/odynophagia, no hoarseness, + tinnitus Cardiovascular: no  CP/SOB/palpitations/leg swelling Respiratory: + cough/no SOB Gastrointestinal: no N/V/D/C Musculoskeletal: no muscle/joint aches Skin: Easy bruising, itching, hair loss Neurological: no tremors/numbness/tingling/dizziness Psychiatric: no depression/anxiety Pbs with erections, low libido  Past Medical History  Diagnosis Date  . Diabetes mellitus   . Hypertension   . Heart disease   . Hyperlipidemia   . Wegener's granulomatosis 2007  . Coronary artery disease    Past Surgical History  Procedure Laterality Date  . Basal cell carcinoma excision      removed from face x 3  . 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 Cardiac surgery  2000    stent in heart  . Cardiac catheterization    . Coronary angioplasty  09/22/1998    s/p stent placement; Tristar 3.0 x 18 mm Ref [id number]   History   Social History  . Marital Status: Married    Spouse Name: N/A    Children: yes   Occupational History  . Retired   Social History Main Topics  . Smoking status: Former Smoker -- 0.50 packs/day for 20 years    Types: Cigarettes    Quit date: 05/30/1978  . Smokeless tobacco: Never Used  . Alcohol Use: No  . Drug Use: No   Social History Narrative   Regular exercise: yes   Caffeine use: very little   Current Outpatient Prescriptions on File Prior to Visit  Medication Sig Dispense Refill  . amLODipine (NORVASC) 5 MG tablet Take 1 tablet (5 mg total) by mouth daily.  90 tablet  1  . aspirin 81 MG tablet Take 81 mg by mouth daily.       Marland Kitchen atorvastatin (LIPITOR) 40 MG tablet Take 1 tablet (40 mg total) by mouth daily.  90 tablet  1  . clotrimazole (LOTRIMIN) 1 % cream Mix with and use with Elocon  AS NEEDED      . fexofenadine (ALLEGRA) 180 MG tablet Take 180 mg by mouth as needed.       Marland Kitchen glimepiride (AMARYL) 4 MG tablet Take 1 tablet (4 mg total) by mouth daily before breakfast.  90 tablet  1  . metFORMIN (GLUCOPHAGE) 1000 MG tablet Take 1 tablet (1,000 mg total)  by mouth 2 (two) times daily.  180 tablet  1  . predniSONE (DELTASONE) 10 MG tablet Take 10 mg by mouth as needed.       Marland Kitchen spironolactone (ALDACTONE) 25 MG tablet Take 1 tablet (25 mg total) by mouth daily.  90 tablet  1  . telmisartan (MICARDIS) 80 MG tablet Take 1 tablet (80 mg total) by mouth daily.  90 tablet  1  . [DISCONTINUED] nebivolol (BYSTOLIC) 5 MG tablet Take 1 tablet (5 mg total) by mouth 2 (two) times daily.  180 tablet  3   No current facility-administered medications on file prior to visit.   Allergies  Allergen Reactions  . Benicar Hct [Olmesartan Medoxomil-Hctz]     Painful and itchy knots on palms of hands  . Bystolic [Nebivolol Hcl]     headache  . Niaspan [Niacin Er] Other (See Comments)    flushing  . Septra [Bactrim] Hives   Family History  Problem Relation Age of Onset  . Cancer Mother     tumor on tonsil  . Stroke Maternal Grandfather    PE: BP 132/70  Pulse 68  Temp(Src) 98.2 F (36.8 C) (Oral)  Resp 10  Ht 5\' 11"  (1.803 m)  Wt 249 lb 12.8 oz (113.309 kg)  BMI 34.86 kg/m2  SpO2 96% Wt Readings from Last 3 Encounters:  03/20/13 249 lb 12.8 oz (113.309 kg)  03/19/13 248 lb 8 oz (112.719 kg)  03/05/13 249 lb (112.946 kg)   Constitutional: overweight, in NAD Eyes: PERRLA, EOMI, no exophthalmos ENT: moist mucous membranes, no thyromegaly, no cervical lymphadenopathy Cardiovascular: RRR, No MRG Respiratory: CTA B Gastrointestinal: abdomen soft, NT, ND, BS+ Musculoskeletal: no deformities, strength intact in all 4 Skin: moist, warm, no rashes Neurological: no tremor with outstretched hands, DTR normal in all 4  ASSESSMENT: 1. DM2, non-insulin-dependent, uncontrolled, without complications  PLAN:  1. Patient with long-standing, not optimally controlled diabetes, on oral antidiabetic regimen. He had a recent increase hemoglobin A1c, which he attributes to eating more fruits: Peaches, strawberries, grapes, as they were in season. - We discussed  about options for treatment, and I suggested to:  Please continue Metformin 1000 mg 2x a day. Start checking your sugars 1-2x a day, rotating check times and write the values down.  Use a ReliOn glucometer - I sent prescriptions for the meter, lancets and strips to your pharmacy. Please return in 1 month with your sugar log.  - Strongly advised him to start checking sugars at different times of the day - check 2 times a day, rotating checks - given foot care handout and explained the principles  - given instructions for hypoglycemia management "15-15 rule"  -  advised for yearly eye exams - He had the flu vaccine this season - Return to clinic in 1 mo with sugar log

## 2013-03-20 NOTE — Telephone Encounter (Signed)
Pharmacy needed to know which ReLion meter. Pt requested the ReLion Prime.

## 2013-03-21 ENCOUNTER — Other Ambulatory Visit: Payer: Self-pay | Admitting: *Deleted

## 2013-03-21 ENCOUNTER — Encounter: Payer: Self-pay | Admitting: *Deleted

## 2013-03-21 ENCOUNTER — Encounter: Payer: Self-pay | Admitting: Family Medicine

## 2013-03-21 MED ORDER — RELION ULTRA THIN LANCETS 30G MISC: Status: DC

## 2013-03-21 MED ORDER — BLOOD GLUCOSE MONITOR KIT: PACK | Status: DC

## 2013-03-21 MED ORDER — GLUCOSE BLOOD VI STRP: ORAL_STRIP | Status: DC

## 2013-03-21 NOTE — Telephone Encounter (Signed)
[facility] on [address] needs new prescriptions written with diagnosis codes.  Call if questions Cala Bradford [phone]) Diagnosis codes needed for all Test Strips Monitor Lancets  Specifics of how many times the pt is testing is also needed

## 2013-03-21 NOTE — Telephone Encounter (Signed)
Had to re-submit refill for meter, strips, lancets with diagnosis codes for insurance.

## 2013-04-24 ENCOUNTER — Ambulatory Visit (INDEPENDENT_AMBULATORY_CARE_PROVIDER_SITE_OTHER): Payer: Medicare Other | Admitting: Internal Medicine

## 2013-04-24 ENCOUNTER — Encounter: Payer: Self-pay | Admitting: Internal Medicine

## 2013-04-24 VITALS — BP 138/86 | HR 77 | Temp 98.1°F | Resp 12 | Wt 247.4 lb

## 2013-04-24 DIAGNOSIS — E119 Type 2 diabetes mellitus without complications: Secondary | ICD-10-CM

## 2013-04-24 DIAGNOSIS — IMO0001 Reserved for inherently not codable concepts without codable children: Secondary | ICD-10-CM

## 2013-04-24 NOTE — Patient Instructions (Signed)
Continue Metformin 1000 mg 2x a day. Continue Glipizide 5 mg 2x a day Start Invokana 100 mg daily in am. Please return in 1 month with your sugar log.

## 2013-04-24 NOTE — Progress Notes (Signed)
Patient ID: Ryan Morgan, male   DOB: 1940/10/12, 72 y.o.   MRN: [id number]  HPI: Ryan Morgan is a 72 y.o.-year-old male,returning for f/u for DM2, dx 2000,  - likely steroid-induced, non-insulin-dependent, uncontrolled, without complications. Last visit 1 mo ago.  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 He takes it prn, usually a taper: starting at 30 mg daily, but usually just takes a 10 mg. Last year he had 2 tapers. Pulmonary: Dr Trish Mage.  Pt is on a regimen of: - Metformin 1000 mg po bid - Amaryl 4 mg in am  Pt checks his sugars 4x a month and they are: - am: 119-144 >> 100-175, but gets different results with OneTouch (lower readings) and with ReliOn (higher readings by 40 pts!) - 2h after b'fast: 225, 271 - before lunch: 225 - 2h after lunch: 191 - before dinner >> n/c - 2h aftre dinner: 255 No lows. Lowest sugar was 100; ? he has hypoglycemia awareness. Highest sugar was 271. One Touch Mini.  Pt's meals are: - Breakfast: Cereals or sausage+ egg+ grips or biscuit and gravy - Lunch: Tomato sandwich - Dinner: Turnips, green beans + Grilled chicken - Snacks: 2-3 a day  - no CKD, last BUN/creatinine:  Lab Results  Component Value Date   BUN 15 02/26/2013   CREATININE 0.8 02/26/2013  He is on telmisartan. - last set of lipids: Lab Results  Component Value Date   CHOL 131 02/26/2013   HDL 35.30* 02/26/2013   LDLCALC 57 02/26/2013   LDLDIRECT 65.7 08/13/2012   TRIG 195.0* 02/26/2013   CHOLHDL 4 02/26/2013  He is on Lipitor. Also on ASA 81.ww - last eye exam was in 12/2012. No DR.  - + numbness and tingling in his toes after his surgery for Wegener dx 2000.  Also has HTN, HL.  I reviewed pt's medications, allergies, PMH, social hx, family hx and no changes required, except as mentioned above.  ROS: Constitutional: no  weight gain/loss, no fatigue, no subjective hyperthermia, + nocturia Eyes: no blurry vision, no xerophthalmia ENT: no sore throat, no nodules palpated in throat, no dysphagia/odynophagia, no hoarseness, + tinnitus Cardiovascular: no CP/SOB/palpitations/leg swelling Respiratory: no cough/no SOB Gastrointestinal: no N/V/D/C Musculoskeletal: no muscle/joint aches Skin: Easy bruising, itching, hair loss Neurological: no tremors/numbness/tingling/dizziness Psychiatric: no depression/anxiety  PE: BP 138/86  Pulse 77  Temp(Src) 98.1 F (36.7 C) (Oral)  Resp 12  Wt 247 lb 6.4 oz (112.22 kg)  SpO2 98% Wt Readings from Last 3 Encounters:  03/20/13 249 lb 12.8 oz (113.309 kg)  03/19/13 248 lb 8 oz (112.719 kg)  03/05/13 249 lb (112.946 kg)   Constitutional: overweight, in NAD Eyes: PERRLA, EOMI, no exophthalmos ENT: moist mucous membranes, no thyromegaly, no cervical lymphadenopathy Cardiovascular: RRR, No MRG Respiratory: CTA B Gastrointestinal: abdomen soft, NT, ND, BS+ Musculoskeletal: no deformities, strength intact in all 4 Skin: moist, warm, no rashes Neurological: no tremor with outstretched hands, DTR normal in all 4  ASSESSMENT: 1. DM2, non-insulin-dependent, uncontrolled, without complications  PLAN:  1. Patient with long-standing, not optimally controlled diabetes, on oral antidiabetic regimen. He had a recent increase hemoglobin A1c, which he attributes to eating more fruits: Peaches, strawberries, grapes, as they were in season. - We discussed about options for treatment, and I suggested to:  Continue Metformin 1000 mg 2x  a day. Continue Glipizide 5 mg 2x a day Start Invokana 100 mg daily in am. Please return in 1 month with your sugar log.  - we discussed about SEs of Invokana, which are: dizziness (advised to be careful when stands from sitting position), decreased BP - usually not < normal (BP today is not low), and fungal UTIs (advised to let me know if develops  one).  - continue checking sugars at different times of the day - check 2 times a day, rotating checks - he is up to date with yearly eye exams - He had the flu vaccine this season - Return to clinic in 1 mo with sugar log

## 2013-05-01 ENCOUNTER — Ambulatory Visit: Payer: Medicare Other | Admitting: Internal Medicine

## 2013-05-08 ENCOUNTER — Ambulatory Visit: Payer: Medicare Other | Admitting: Internal Medicine

## 2013-05-24 ENCOUNTER — Other Ambulatory Visit: Payer: Self-pay | Admitting: *Deleted

## 2013-05-24 MED ORDER — AMLODIPINE BESYLATE 5 MG PO TABS: 5.0000 mg | ORAL_TABLET | Freq: Every day | ORAL | Status: DC

## 2013-06-03 ENCOUNTER — Encounter: Payer: Self-pay | Admitting: Family Medicine

## 2013-06-03 ENCOUNTER — Ambulatory Visit (INDEPENDENT_AMBULATORY_CARE_PROVIDER_SITE_OTHER): Payer: Medicare Other | Admitting: Family Medicine

## 2013-06-03 VITALS — BP 120/70 | HR 83 | Temp 98.2°F | Ht 71.0 in | Wt 248.5 lb

## 2013-06-03 DIAGNOSIS — R202 Paresthesia of skin: Secondary | ICD-10-CM

## 2013-06-03 DIAGNOSIS — M5010 Cervical disc disorder with radiculopathy, unspecified cervical region: Secondary | ICD-10-CM

## 2013-06-03 DIAGNOSIS — R200 Anesthesia of skin: Secondary | ICD-10-CM

## 2013-06-03 DIAGNOSIS — R209 Unspecified disturbances of skin sensation: Secondary | ICD-10-CM

## 2013-06-03 DIAGNOSIS — M5412 Radiculopathy, cervical region: Secondary | ICD-10-CM

## 2013-06-03 MED ORDER — PREGABALIN 150 MG PO CAPS: 150.0000 mg | ORAL_CAPSULE | Freq: Two times a day (BID) | ORAL | Status: DC

## 2013-06-03 MED ORDER — PREGABALIN 75 MG PO CAPS: 75.0000 mg | ORAL_CAPSULE | Freq: Two times a day (BID) | ORAL | Status: DC

## 2013-06-03 NOTE — Progress Notes (Signed)
Date:  06/03/2013   Name:  Ryan Morgan   DOB:  [birth year]   MRN:  [id number] Gender: male Age: 73 y.o.  Primary Physician:  Loura Pardon, MD   Chief Complaint: Shoulder Tingling   Subjective:   History of Present Illness:  Ryan Morgan is a 73 y.o. pleasant patient who presents with the following:  L shoulder tingling: h/o multiple medical problems including CAD s/p PCI, DM poorly controlled, and Wegener's for which he is chronically on steroids, currently on prednisone 10 mg daily.  Mostly anterior left shoulder and up to the left ear and up on the left. Did not do anything. No accident.  This is an ongoing issue that has been bothering him for mostly months and months. He spoke to me about it several months ago when I was seeing his wife. He is not really having any pain with shoulder movement. No pain with abduction or internal range of motion. His range of motion is full.  There is no history of significant shoulder and neck trauma. No fractures, no operative intervention in either the shoulder or the neck.  Had an old cracked rib from last year.    Patient Active Problem List   Diagnosis Date Noted  . Coronary atherosclerosis of native coronary artery 03/19/2013  . Encounter for Medicare annual wellness exam 03/05/2013  . 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 Osteopenia 03/05/2013  . Obesity 07/15/2011  . Prostate cancer screening 07/10/2011  . Blunt head trauma 07/04/2011  . Headache on top of head 06/29/2011  . Cough 12/23/2010  . Hypertension 08/30/2010  . Hyperlipemia 08/30/2010  . Diabetes mellitus, type 2 08/30/2010  . 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    Past Medical History  Diagnosis Date  . Diabetes mellitus   . Hypertension   . Heart disease   . Hyperlipidemia   . Wegener's granulomatosis 2007  . Coronary artery disease     Past Surgical History  Procedure Laterality Date  . Basal cell carcinoma excision     removed from face x 3  . 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 Cardiac surgery  2000    stent in heart  . Cardiac catheterization    . Coronary angioplasty  09/22/1998    s/p stent placement; Tristar 3.0 x 18 mm Ref [id number]    History   Social History  . Marital Status: Married    Spouse Name: N/A    Number of Children: N/A  . Years of Education: N/A   Occupational History  . Not on file.   Social History Main Topics  . Smoking status: Former Smoker -- 0.50 packs/day for 20 years    Types: Cigarettes    Quit date: 05/30/1978  . Smokeless tobacco: Never Used  . Alcohol Use: No  . Drug Use: No  . Sexual Activity: Not on file   Other Topics Concern  . Not on file   Social History Narrative   Regular exercise: yes   Caffeine use: very little    Family History  Problem Relation Age of Onset  . Cancer Mother     tumor on tonsil  . Stroke Maternal Grandfather     Allergies  Allergen Reactions  . Benicar Hct [Olmesartan Medoxomil-Hctz]     Painful and itchy knots on palms of hands  . Bystolic [Nebivolol Hcl]     headache  . Niaspan [Niacin Er] Other (See Comments)  flushing  . Septra [Bactrim] Hives    Medication list has been reviewed and updated.  Review of Systems:  GEN: No fevers, chills. Nontoxic. Primarily MSK c/o today. MSK: Detailed in the HPI GI: tolerating PO intake without difficulty Neuro: as noted Otherwise the pertinent positives of the ROS are noted above.   Objective:   Physical Examination: BP 120/70  Pulse 83  Temp(Src) 98.2 F (36.8 C) (Oral)  Ht _0  (1.803 m)  Wt 248 lb 8 oz (112.719 kg)  BMI 34.67 kg/m2  Ideal Body Weight: Weight in (lb) to have BMI = 25: 178.9   GEN: Well-developed,well-nourished,in no acute distress; alert,appropriate and cooperative throughout examination HEENT: Normocephalic and atraumatic without obvious abnormalities. Ears, externally no deformities PULM: Breathing  comfortably in no respiratory distress EXT: No clubbing, cyanosis, or edema PSYCH: Normally interactive. Cooperative during the interview. Pleasant. Friendly and conversant. Not anxious or depressed appearing. Normal, full affect.  CERVICAL SPINE EXAM Range of motion: Flexion, extension, lateral bending, and rotation: mild loss of range of motion multiple directions. Pain with terminal motion: yes, but relatively mild. Spinous Processes: NT SCM: NT Upper paracervical muscles: posteriorly mildly tender to palpation. Upper traps: NT C5-T1 intact, sensation and motor, strength  But there is some tingling in the upper extremity and down the arm mostly diffusely.  Shoulder: LEFT Inspection: No muscle wasting or winging Ecchymosis/edema: neg  AC joint, scapula, clavicle: NT Abduction: full, 5/5 Flexion: full, 5/5 IR, full, lift-off: 5/5 ER at neutral: full, 5/5 AC crossover and compression: neg Neer: neg Hawkins: neg Drop Test: neg Empty Can: neg Supraspinatus insertion: NT Bicipital groove: NT Speed's: neg Yergason's: neg Sulcus sign: neg Scapular dyskinesis: none   Assessment & Plan:    Cervical disc disorder with radiculopathy of cervical region  Numbness and tingling in left arm  The shoulder itself is very well, and he really has no shoulder pathology that I can identify clinically.  Negative symptoms are all coming from his neck. Likely radiculopathy with nerve impingement on the LEFT. For now, we are going to treat this clinically. Physical therapy and potential traction would be a reasonable idea as well, but for now and would like to do some simple home exercises.  Start with Lyrica, and titrated up.  Patient Instructions  F/u 2 months  New medications, updates to list, dose adjustments: Meds ordered this encounter  Medications  . triamcinolone cream (KENALOG) 0.1 %    Sig: Apply 1 application topically 2 (two) times daily.  . pregabalin (LYRICA) 75 MG  capsule    Sig: Take 1 capsule (75 mg total) by mouth 2 (two) times daily. Then increase to 2 po bid after 1 week    Dispense:  120 capsule    Refill:  0  . pregabalin (LYRICA) 150 MG capsule    Sig: Take 1 capsule (150 mg total) by mouth 2 (two) times daily.    Dispense:  60 capsule    Refill:  3    Signed,  Chelle Cayton T. Ayianna Darnold, MD, [place] at [facility] [place] [address] Phone: [phone] Fax: [phone]    Medication List       This list is accurate as of: 06/03/13 11:59 PM.  Always use your most recent med list.               amLODipine 5 MG tablet  Commonly known as:  NORVASC  Take 1 tablet (5 mg total)  by mouth daily.     aspirin 81 MG tablet  Take 81 mg by mouth daily.     atorvastatin 40 MG tablet  Commonly known as:  LIPITOR  Take 1 tablet (40 mg total) by mouth daily.     BLOOD GLUCOSE MONITOR KIT Kit  Use as advised - ReliOn Prime Glucose Meter. Dx code: 250.00.     fexofenadine 180 MG tablet  Commonly known as:  ALLEGRA  Take 180 mg by mouth as needed.     glimepiride 4 MG tablet  Commonly known as:  AMARYL  Take 1 tablet (4 mg total) by mouth daily before breakfast.     glucose blood test strip  Use as instructed, 1x a day - ReliOn Prime Glucose Strips. Dx code: 250.00     metFORMIN 1000 MG tablet  Commonly known as:  GLUCOPHAGE  Take 1 tablet (1,000 mg total) by mouth 2 (two) times daily.     predniSONE 10 MG tablet  Commonly known as:  DELTASONE  Take 10 mg by mouth as needed.     pregabalin 75 MG capsule  Commonly known as:  LYRICA  Take 1 capsule (75 mg total) by mouth 2 (two) times daily. Then increase to 2 po bid after 1 week     pregabalin 150 MG capsule  Commonly known as:  LYRICA  Take 1 capsule (150 mg total) by mouth 2 (two) times daily.     RELION ULTRA THIN LANCETS 30G Misc  Use once a day. Dx code: 250.00.     spironolactone 25 MG tablet  Commonly known as:   ALDACTONE  Take 1 tablet (25 mg total) by mouth daily.     telmisartan 80 MG tablet  Commonly known as:  MICARDIS  Take 1 tablet (80 mg total) by mouth daily.     triamcinolone cream 0.1 %  Commonly known as:  KENALOG  Apply 1 application topically 2 (two) times daily.

## 2013-06-03 NOTE — Progress Notes (Signed)
Pre-visit discussion using our clinic review tool. No additional management support is needed unless otherwise documented below in the visit note.  

## 2013-06-03 NOTE — Patient Instructions (Signed)
F/u 2 months 

## 2013-06-05 ENCOUNTER — Ambulatory Visit: Payer: Medicare Other | Admitting: Internal Medicine

## 2013-07-29 ENCOUNTER — Other Ambulatory Visit: Payer: Self-pay | Admitting: *Deleted

## 2013-07-29 NOTE — Telephone Encounter (Signed)
Received faxed refill request from pharmacy. See allergy/contraindication. Is it okay to refill medication?

## 2013-07-29 NOTE — Telephone Encounter (Signed)
Aware-please refill for 6 months-thanks

## 2013-07-30 MED ORDER — ATORVASTATIN CALCIUM 40 MG PO TABS: 40.0000 mg | ORAL_TABLET | Freq: Every day | ORAL | Status: DC

## 2013-07-30 MED ORDER — TELMISARTAN 80 MG PO TABS: 80.0000 mg | ORAL_TABLET | Freq: Every day | ORAL | Status: DC

## 2013-07-30 MED ORDER — GLIMEPIRIDE 4 MG PO TABS: 4.0000 mg | ORAL_TABLET | Freq: Every day | ORAL | Status: DC

## 2013-07-30 NOTE — Telephone Encounter (Signed)
done

## 2013-08-01 ENCOUNTER — Ambulatory Visit: Payer: Medicare Other | Admitting: Family Medicine

## 2013-08-27 ENCOUNTER — Other Ambulatory Visit: Payer: Self-pay | Admitting: *Deleted

## 2013-09-02 ENCOUNTER — Other Ambulatory Visit: Payer: Self-pay | Admitting: *Deleted

## 2013-09-02 ENCOUNTER — Encounter: Payer: Self-pay | Admitting: Family Medicine

## 2013-09-02 NOTE — Telephone Encounter (Signed)
Rout to Dr. Cruzita Lederer since she is managing DM care and Dr. Glori Bickers out of office

## 2013-09-02 NOTE — Telephone Encounter (Signed)
Rout to Dr. Cruzita Lederer since she is managing his DM care and Dr. Glori Bickers out of the office all week

## 2013-09-03 ENCOUNTER — Other Ambulatory Visit: Payer: Self-pay

## 2013-09-03 MED ORDER — METFORMIN HCL 1000 MG PO TABS: 1000.0000 mg | ORAL_TABLET | Freq: Two times a day (BID) | ORAL | Status: DC

## 2013-09-05 ENCOUNTER — Other Ambulatory Visit: Payer: Self-pay

## 2013-09-26 ENCOUNTER — Encounter: Payer: Self-pay | Admitting: Family Medicine

## 2013-10-04 ENCOUNTER — Other Ambulatory Visit: Payer: Self-pay | Admitting: *Deleted

## 2013-10-04 MED ORDER — SPIRONOLACTONE 25 MG PO TABS: 25.0000 mg | ORAL_TABLET | Freq: Every day | ORAL | Status: DC

## 2013-10-17 ENCOUNTER — Ambulatory Visit (INDEPENDENT_AMBULATORY_CARE_PROVIDER_SITE_OTHER): Payer: Medicare Other | Admitting: Family Medicine

## 2013-10-17 ENCOUNTER — Ambulatory Visit (INDEPENDENT_AMBULATORY_CARE_PROVIDER_SITE_OTHER)
Admission: RE | Admit: 2013-10-17 | Discharge: 2013-10-17 | Disposition: A | Discharge status: Home / Self Care | Payer: Medicare Other | Source: Ambulatory Visit | Attending: Family Medicine | Admitting: Family Medicine

## 2013-10-17 ENCOUNTER — Encounter: Payer: Self-pay | Admitting: Family Medicine

## 2013-10-17 VITALS — BP 146/82 | HR 88 | Temp 98.2°F | Wt 249.2 lb

## 2013-10-17 DIAGNOSIS — M76899 Other specified enthesopathies of unspecified lower limb, excluding foot: Secondary | ICD-10-CM

## 2013-10-17 DIAGNOSIS — M7071 Other bursitis of hip, right hip: Secondary | ICD-10-CM | POA: Insufficient documentation

## 2013-10-17 MED ORDER — NAPROXEN 500 MG PO TABS: ORAL_TABLET | ORAL | Status: DC

## 2013-10-17 NOTE — Patient Instructions (Signed)
You have bursitis of your right hip. Treat with naprosyn anti inflammatory twice daily with food for 5 days then as needed, as well as use heating pad to outside hip for 15 min then stretching exercises provided today. If not better over weekend, schedule appointment on Monday with our sports medicine doctor - Dr Lorelei Pont for further evaluation and possible injection Let us know if worsening pain or not improving as expected. Xray of right hip today.

## 2013-10-17 NOTE — Progress Notes (Signed)
Pre visit review using our clinic review tool, if applicable. No additional management support is needed unless otherwise documented below in the visit note. 

## 2013-10-17 NOTE — Assessment & Plan Note (Addendum)
Anticipate posterior trochanteric bursitis of R hip with evident marked antalgic gait as result. Will treat supportively with heating pad and stretches (provided today from [facility] pt advisor), and prescription NSAID naprosyn 500mg  bid for 5 day then as needed. If not improving with treatment, I suggested he f/u with Dr. Charlann Boxer at our [facility] office for further eval and to discuss bursitis injection as Dr. Lorelei Pont is out of office until Tuesday. Check Xrays given somewhat atypical location of reproducible bursal pain. Pt/wife agree with plan. Discussed cross-leg pulls.

## 2013-10-17 NOTE — Progress Notes (Signed)
BP 146/82  Pulse 88  Temp(Src) 98.2 F (36.8 C) (Oral)  Wt 249 lb 4 oz (113.059 kg)   CC: R hip pain  Subjective:    Patient ID: Ryan Morgan, male    DOB: 02/05/1941, 73 y.o.   MRN: [id number]  HPI: Ryan Morgan is a 73 y.o. male presenting on 10/17/2013 for Hip Pain   R lateral hip pain started acutely yesterday morning when he woke up. Excruciating pain with any movement.  Difficulty walking - using crutches. Denies fevers/chills, back pain, no radiation of pain, denies weakness, tingling or numbness of R foot. Self treated with 30 mg prednisone he had at home. Denies inciting trauma/falls. No swelling at area or bruising. Tried hot tub - worsened pain.  Upcoming trip to [place] with church - on bus - leaving Tuesday. H/o wegener's granulomatosis.  Lab Results  Component Value Date   CREATININE 0.8 02/26/2013    Relevant past medical, surgical, family and social history reviewed and updated as indicated.  Allergies and medications reviewed and updated. Current Outpatient Prescriptions on File Prior to Visit  Medication Sig  . amLODipine (NORVASC) 5 MG tablet Take 1 tablet (5 mg total) by mouth daily.  Marland Kitchen aspirin 81 MG tablet Take 81 mg by mouth daily.   Marland Kitchen atorvastatin (LIPITOR) 40 MG tablet Take 1 tablet (40 mg total) by mouth daily.  . fexofenadine (ALLEGRA) 180 MG tablet Take 180 mg by mouth as needed.   Marland Kitchen glimepiride (AMARYL) 4 MG tablet Take 1 tablet (4 mg total) by mouth daily before breakfast.  . metFORMIN (GLUCOPHAGE) 1000 MG tablet Take 1 tablet (1,000 mg total) by mouth 2 (two) times daily.  . predniSONE (DELTASONE) 10 MG tablet Take 10 mg by mouth as needed.   Marland Kitchen spironolactone (ALDACTONE) 25 MG tablet Take 1 tablet (25 mg total) by mouth daily.  Marland Kitchen telmisartan (MICARDIS) 80 MG tablet Take 1 tablet (80 mg total) by mouth daily.  Marland Kitchen triamcinolone cream (KENALOG) 0.1 % Apply 1 application topically 2 (two) times daily.  . [DISCONTINUED] nebivolol (BYSTOLIC) 5 MG  tablet Take 1 tablet (5 mg total) by mouth 2 (two) times daily.   No current facility-administered medications on file prior to visit.    Review of Systems Per HPI unless specifically indicated above    Objective:    BP 146/82  Pulse 88  Temp(Src) 98.2 F (36.8 C) (Oral)  Wt 249 lb 4 oz (113.059 kg)  Physical Exam  Nursing note and vitals reviewed. Constitutional: He appears well-developed and well-nourished. No distress.  Musculoskeletal: He exhibits no edema.  No pain midline spine No paraspinous mm tenderness Neg SLR bilaterally. No pain with int/ext rotation at hip. Neg FABER. No pain at SIJ or sciatic notch bilaterally. Reproducible point tenderness just posterior to R GTB, tender with weight bearing of right leg. Walks with crutches  Neurological:  5/5 strength, sensation intact   Results for orders placed in visit on 03/21/13  HM DEXA SCAN      Result Value Ref Range   HM Dexa Scan normal        Assessment & Plan:   Problem List Items Addressed This Visit   Bursitis of right hip - Primary     Anticipate posterior trochanteric bursitis of R hip with evident marked antalgic gait as result. Will treat supportively with heating pad and stretches (provided today from [facility] pt advisor), and prescription NSAID naprosyn 500mg  bid for 5 day then as needed. If not improving  with treatment, I suggested he f/u with Dr. Charlann Boxer at our [facility] office for further eval and to discuss bursitis injection as Dr. Lorelei Pont is out of office until Tuesday. Check Xrays given somewhat atypical location of reproducible bursal pain. Pt/wife agree with plan. Discussed cross-leg pulls.    Relevant Orders      DG Hip Complete Right       Follow up plan: Return if symptoms worsen or fail to improve.

## 2013-10-18 ENCOUNTER — Ambulatory Visit: Payer: Medicare Other | Admitting: Family Medicine

## 2013-12-12 ENCOUNTER — Telehealth: Payer: Self-pay | Admitting: Family Medicine

## 2013-12-12 NOTE — Telephone Encounter (Signed)
Diabetic Bundle.  Last BP was 146/82 on 10/17/13.  Do you want me to schedule follow up to recheck BP?

## 2013-12-12 NOTE — Telephone Encounter (Signed)
I'm fine with scheduling his annual exam with me after Oct 7th-please call him to schedule that annual exam with labs prior BP Readings from Last 3 Encounters:  10/17/13 146/82  06/03/13 120/70  04/24/13 138/86   his previous bp were better

## 2013-12-13 NOTE — Telephone Encounter (Signed)
Pt needs CPE after October 7th.  Left vm for pt to return call.

## 2013-12-14 ENCOUNTER — Ambulatory Visit (INDEPENDENT_AMBULATORY_CARE_PROVIDER_SITE_OTHER): Payer: Medicare Other | Admitting: Family

## 2013-12-14 VITALS — BP 158/90 | HR 67 | Temp 98.1°F | Wt 246.0 lb

## 2013-12-14 DIAGNOSIS — H8110 Benign paroxysmal vertigo, unspecified ear: Secondary | ICD-10-CM

## 2013-12-14 DIAGNOSIS — I10 Essential (primary) hypertension: Secondary | ICD-10-CM

## 2013-12-14 MED ORDER — MECLIZINE HCL 25 MG PO TABS: 25.0000 mg | ORAL_TABLET | Freq: Three times a day (TID) | ORAL | Status: DC | PRN

## 2013-12-14 MED ORDER — AMLODIPINE BESYLATE 10 MG PO TABS: 10.0000 mg | ORAL_TABLET | Freq: Every day | ORAL | Status: DC

## 2013-12-14 NOTE — Progress Notes (Signed)
 Subjective:    Patient ID: Ryan Morgan, male    DOB: 05/30/1940, 73 y.o.   MRN: [id number]  HPI  Ryan Morgan is a 73 yr old male who presents today to discuss dizzines.  Reports yesterday he had a 30 minute episode of dizziness. Had another episode when he tried to get up off the couch.  BP 187/101 at the time of dizziness.  Woke up this morning and "couldn't get up due to the dizziness." Reports that his bp was 196/108.  He reports that he took his AM meds on the way over to the appointment.  He denies numbness or weakness.   He did not check his sugar this AM.  He denies dizziness at present.   BP Readings from Last 3 Encounters:  12/14/13 160/80  10/17/13 146/82  06/03/13 120/70     Review of Systems    see HPI  Past Medical History  Diagnosis Date  . Diabetes mellitus   . Hypertension   . Heart disease   . Hyperlipidemia   . Wegener's granulomatosis 2007  . Coronary artery disease     History   Social History  . Marital Status: Married    Spouse Name: N/A    Number of Children: N/A  . Years of Education: N/A   Occupational History  . Not on file.   Social History Main Topics  . Smoking status: Former Smoker -- 0.50 packs/day for 20 years    Types: Cigarettes    Quit date: 05/30/1978  . Smokeless tobacco: Never Used  . Alcohol Use: No  . Drug Use: No  . Sexual Activity: Not on file   Other Topics Concern  . Not on file   Social History Narrative   Regular exercise: yes   Caffeine use: very little    Past Surgical History  Procedure Laterality Date  . Basal cell carcinoma excision      removed from face x 3  . 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 Cardiac surgery  2000    stent in heart  . Cardiac catheterization    . Coronary angioplasty  09/22/1998    s/p stent placement; [employer] 3.0 x 18 mm Ref [id number]    Family History  Problem Relation Age of Onset  . Cancer Mother     tumor on tonsil  . Stroke Maternal  Grandfather     Allergies  Allergen Reactions  . Benicar Hct [Olmesartan Medoxomil-Hctz]     Painful and itchy knots on palms of hands  . Bystolic [Nebivolol Hcl]     headache  . Niaspan [Niacin Er] Other (See Comments)    flushing  . Septra [Bactrim] Hives    Current Outpatient Prescriptions on File Prior to Visit  Medication Sig Dispense Refill  . amLODipine (NORVASC) 5 MG tablet Take 1 tablet (5 mg total) by mouth daily.  90 tablet  0  . aspirin 81 MG tablet Take 81 mg by mouth daily.       . atorvastatin (LIPITOR) 40 MG tablet Take 1 tablet (40 mg total) by mouth daily.  90 tablet  1  . Blood Glucose Monitoring Suppl (BLOOD GLUCOSE MONITOR KIT) KIT Use as advised - One Touch Glucose Meter. Dx code: 250.00.      . fexofenadine (ALLEGRA) 180 MG tablet Take 180 mg by mouth as needed.       . glimepiride (AMARYL) 4 MG tablet Take 1 tablet (4   mg total) by mouth daily before breakfast.  90 tablet  1  . glucose blood test strip Use as instructed, 1x a day - One Touch Glucose Strips. Dx code: 250.00      . LANCETS ULTRA THIN 30G MISC by Does not apply route. One Touch      . metFORMIN (GLUCOPHAGE) 1000 MG tablet Take 1 tablet (1,000 mg total) by mouth 2 (two) times daily.  180 tablet  1  . naproxen (NAPROSYN) 500 MG tablet Take one po bid x 1 week then prn pain, take with food  40 tablet  0  . predniSONE (DELTASONE) 10 MG tablet Take 10 mg by mouth as needed.       . spironolactone (ALDACTONE) 25 MG tablet Take 1 tablet (25 mg total) by mouth daily.  90 tablet  1  . telmisartan (MICARDIS) 80 MG tablet Take 1 tablet (80 mg total) by mouth daily.  90 tablet  1  . triamcinolone cream (KENALOG) 0.1 % Apply 1 application topically 2 (two) times daily.      . [DISCONTINUED] nebivolol (BYSTOLIC) 5 MG tablet Take 1 tablet (5 mg total) by mouth 2 (two) times daily.  180 tablet  3   No current facility-administered medications on file prior to visit.    BP 160/80  Pulse 67  Temp(Src) 98.1 F  (36.7 C) (Oral)  Wt 246 lb (111.585 kg)  SpO2 97%    Objective:   Physical Exam  Constitutional: He is oriented to person, place, and time. He appears well-developed and well-nourished. No distress.  HENT:  Head: Normocephalic and atraumatic.  Right Ear: Tympanic membrane and ear canal normal.  Left Ear: Tympanic membrane and ear canal normal.  Cardiovascular: Normal rate and regular rhythm.   No murmur heard. Pulmonary/Chest: Effort normal and breath sounds normal. No respiratory distress. He has no wheezes. He has no rales. He exhibits no tenderness.  Neurological: He is alert and oriented to person, place, and time. He has normal strength. No cranial nerve deficit.  Psychiatric: He has a normal mood and affect. His behavior is normal. Judgment and thought content normal.          Assessment & Plan:   

## 2013-12-14 NOTE — Assessment & Plan Note (Signed)
New. Trial of meclizine. We did discuss signs/symptoms of stroke and he is advised to go to the ED if this occurs.

## 2013-12-14 NOTE — Patient Instructions (Signed)
Increase amlodipine from 5 mg to 10mg  once daily. You can take an additional 5mg  tab today, then tomorrow start the 10mg  tabs that have been sent to your pharmacy. Also, you can try meclizine as needed or dizziness. Follow up with Dr. Glori Bickers later this week for evaluation. Go to the ER if you develop dizziness not improved by meclizine, or if you develop numbness/weakness/slurred speech.

## 2013-12-14 NOTE — Assessment & Plan Note (Addendum)
No significant orthostatic hypotension. Uncontrolled BP.  Increase amlodipine from 5mg  to 10mg .

## 2013-12-16 ENCOUNTER — Encounter: Payer: Self-pay | Admitting: Family Medicine

## 2013-12-16 ENCOUNTER — Ambulatory Visit (INDEPENDENT_AMBULATORY_CARE_PROVIDER_SITE_OTHER): Payer: Medicare Other | Admitting: Family Medicine

## 2013-12-16 VITALS — BP 124/74 | HR 90 | Temp 98.3°F | Ht 71.0 in | Wt 247.8 lb

## 2013-12-16 DIAGNOSIS — H8110 Benign paroxysmal vertigo, unspecified ear: Secondary | ICD-10-CM

## 2013-12-16 DIAGNOSIS — H8113 Benign paroxysmal vertigo, bilateral: Secondary | ICD-10-CM

## 2013-12-16 MED ORDER — AMLODIPINE BESYLATE 10 MG PO TABS: 10.0000 mg | ORAL_TABLET | Freq: Every day | ORAL | Status: DC

## 2013-12-16 NOTE — Progress Notes (Signed)
[place] [address] Phone: [phone] Fax: [phone]  Patient ID: Ryan Morgan MRN: [id number], DOB: [birth year], 73 y.o. Date of Encounter: 12/16/2013  Primary Physician:  Loura Pardon, MD   Chief Complaint: Follow-up   Subjective:   History of Present Illness:  Ryan Morgan is a 73 y.o. very pleasant male patient who presents with the following:  Vertigo? Saturday morning and Friday night got up off the sofa and felt like the tv started moving and then when sat up on the bed, then everything went rolling. With sitting up.   When sitting down in the recliner. When went to bed. Vertigo with check on sat.   BPPV.   Past Medical History, Surgical History, Social History, Family History, Problem List, Medications, and Allergies have been reviewed and updated if relevant.  Review of Systems:  GEN: No acute illnesses, no fevers, chills. GI: No n/v/d, eating normally Pulm: No SOB Interactive and getting along well at home.  Otherwise, ROS is as per the HPI.  Objective:   Physical Examination: BP 124/74  Pulse 90  Temp(Src) 98.3 F (36.8 C) (Oral)  Ht '5\' 11"'  (1.803 m)  Wt 247 lb 12 oz (112.379 kg)  BMI 34.57 kg/m2  SpO2 96%   GEN: WDWN, NAD, Non-toxic, A & O x 3 HEENT: Atraumatic, Normocephalic. Neck supple. No masses, No LAD. Mild induction with head movement Ears and Nose: No external deformity. CV: RRR, No M/G/R. No JVD. No thrill. No extra heart sounds. PULM: CTA B, no wheezes, crackles, rhonchi. No retractions. No resp. distress. No accessory muscle use. EXTR: No c/c/e NEURO Normal gait.  PSYCH: Normally interactive. Conversant. Not depressed or anxious appearing.  Calm demeanor.   Laboratory and Imaging Data:  Assessment & Plan:   Benign paroxysmal positional vertigo, bilateral  Expect 2-3 weeks, cont antivert. F/u if no improvement in 3-4 weeks  No red flags. No additional neuro signs  New Prescriptions   No medications on  file   Modified Medications   Modified Medication Previous Medication   AMLODIPINE (NORVASC) 10 MG TABLET amLODipine (NORVASC) 10 MG tablet      Take 1 tablet (10 mg total) by mouth daily.    Take 1 tablet (10 mg total) by mouth daily.   No orders of the defined types were placed in this encounter.   Follow-up: No Follow-up on file. Unless noted above, the patient is to follow-up if symptoms worsen. Red flags were reviewed with the patient.  Signed,  Maud Deed. Ailani Governale, MD, CAQ Sports Medicine   Discontinued Medications   No medications on file   Current Medications at Discharge:   Medication List       This list is accurate as of: 12/16/13 11:59 PM.  Always use your most recent med list.               amLODipine 10 MG tablet  Commonly known as:  NORVASC  Take 1 tablet (10 mg total) by mouth daily.     aspirin 81 MG tablet  Take 81 mg by mouth daily.     atorvastatin 40 MG tablet  Commonly known as:  LIPITOR  Take 1 tablet (40 mg total) by mouth daily.     blood glucose meter kit and supplies Kit  Use as advised - One Touch Glucose Meter. Dx code: 250.00.     fexofenadine 180 MG tablet  Commonly known as:  ALLEGRA  Take 180 mg by mouth as  needed.     glimepiride 4 MG tablet  Commonly known as:  AMARYL  Take 1 tablet (4 mg total) by mouth daily before breakfast.     glucose blood test strip  Use as instructed, 1x a day - One Touch Glucose Strips. Dx code: 250.00     LANCETS ULTRA THIN 30G Misc  by Does not apply route. One Touch     meclizine 25 MG tablet  Commonly known as:  ANTIVERT  Take 1 tablet (25 mg total) by mouth 3 (three) times daily as needed for dizziness.     metFORMIN 1000 MG tablet  Commonly known as:  GLUCOPHAGE  Take 1 tablet (1,000 mg total) by mouth 2 (two) times daily.     naproxen 500 MG tablet  Commonly known as:  NAPROSYN  Take one po bid x 1 week then prn pain, take with food     predniSONE 10 MG tablet  Commonly known as:   DELTASONE  Take 10 mg by mouth as needed.     spironolactone 25 MG tablet  Commonly known as:  ALDACTONE  Take 1 tablet (25 mg total) by mouth daily.     telmisartan 80 MG tablet  Commonly known as:  MICARDIS  Take 1 tablet (80 mg total) by mouth daily.     triamcinolone cream 0.1 %  Commonly known as:  KENALOG  Apply 1 application topically 2 (two) times daily.

## 2013-12-16 NOTE — Progress Notes (Signed)
Pre visit review using our clinic review tool, if applicable. No additional management support is needed unless otherwise documented below in the visit note. 

## 2014-01-23 ENCOUNTER — Other Ambulatory Visit: Payer: Self-pay | Admitting: *Deleted

## 2014-01-23 MED ORDER — ATORVASTATIN CALCIUM 40 MG PO TABS: 40.0000 mg | ORAL_TABLET | Freq: Every day | ORAL | Status: DC

## 2014-01-23 MED ORDER — TELMISARTAN 80 MG PO TABS: 80.0000 mg | ORAL_TABLET | Freq: Every day | ORAL | Status: DC

## 2014-02-18 ENCOUNTER — Other Ambulatory Visit: Payer: Self-pay | Admitting: *Deleted

## 2014-02-18 MED ORDER — GLIMEPIRIDE 4 MG PO TABS: 4.0000 mg | ORAL_TABLET | Freq: Every day | ORAL | Status: DC

## 2014-03-10 ENCOUNTER — Ambulatory Visit (INDEPENDENT_AMBULATORY_CARE_PROVIDER_SITE_OTHER): Payer: Medicare Other | Admitting: Pulmonary Disease

## 2014-03-10 ENCOUNTER — Encounter: Payer: Self-pay | Admitting: Pulmonary Disease

## 2014-03-10 VITALS — BP 132/74 | HR 79 | Ht 71.0 in | Wt 248.0 lb

## 2014-03-10 DIAGNOSIS — R0789 Other chest pain: Secondary | ICD-10-CM

## 2014-03-10 DIAGNOSIS — R059 Cough, unspecified: Secondary | ICD-10-CM

## 2014-03-10 DIAGNOSIS — R05 Cough: Secondary | ICD-10-CM

## 2014-03-10 DIAGNOSIS — R0602 Shortness of breath: Secondary | ICD-10-CM

## 2014-03-10 DIAGNOSIS — M3130 Wegener's granulomatosis without renal involvement: Secondary | ICD-10-CM

## 2014-03-10 DIAGNOSIS — Z23 Encounter for immunization: Secondary | ICD-10-CM

## 2014-03-10 NOTE — Progress Notes (Signed)
Subjective:    Patient ID: Ryan Morgan, male    DOB: 31-Dec-1940, 73 y.o.   MRN: [id number]  Synopsis: Ryan Morgan was diagnosed with Wegener's granulomatosis in 1997 with joint pain, sinus disease. History with Cytoxan through 1999 for about 2 years total. Since then he had been treated off and on with prednisone. He states that he took prednisone nearly continuously for about 10 years. There is discrepancy in the notes, he feels that he did have significant respiratory symptoms related to Wegener's granulomatosis at 1 point and was treated with IV Solu-Medrol and hospital for this.  HPI   03/10/2014 ROV > Ryan Morgan has only had to use prednisone about 2 times in the last year.  He takes about 10 mg for two days then.  He and his wife have been traveling a lot.  He has been coughing up yellow phelgm.  It is often white in color.  It typically collects in his throat in the early mornings.  He has a lot of sinus congestoin in his throat.  He has used Human resources officer but not regularly.   More dyspnea in the last year which she reports as occurring with activity. He says for example when he will be cutting the grass he frequently has to stop to catch his breath. He says that if he takes a few deep breaths and rest for 3-4 minutes the shortness of breath will go away. This has been progressing. He also notes an occasional chest pain in his left chest which appears to occur primarily at rest. It is not predictable with exertion. It only lasts for a few minutes at a time.   Past Medical History  Diagnosis Date  . Diabetes mellitus   . Hypertension   . Heart disease   . Hyperlipidemia   . Wegener's granulomatosis 2007  . Coronary artery disease     Review of Systems  Constitutional: Negative for fever, chills and fatigue.  HENT: Positive for ear pain. Negative for congestion, postnasal drip and rhinorrhea.   Respiratory: Positive for cough. Negative for shortness of breath and wheezing.   Cardiovascular:  Negative for chest pain, palpitations and leg swelling.  Musculoskeletal: Positive for arthralgias and joint swelling.       Objective:   Physical Exam   Filed Vitals:   03/10/14 1535  BP: 132/74  Pulse: 79  Height: '5\' 11"'  (1.803 m)  Weight: 248 lb (112.492 kg)  SpO2: 97%   room air  Gen: well appearing, no acute distress HEENT: NCAT, EOMi, OP clear,  PULM: CTA B CV: RRR, no mgr, no JVD AB: BS+, soft, nontender, no hsm MSK: No joint swelling today Derm: no rash or skin breakdown      Assessment & Plan:   Wegener's granulomatosis This has been a stable interval. We will check an ANCA level today. Continue when necessary prednisone.  Cough He has upper airway cough syndrome secondary to postnasal drip. He is currently not taking any treatment for his allergic rhinitis.  Plan: -Use Nasacort -Use Allegra -On bad days use saline rinses  Shortness of breath He reports increasing shortness of breath over the last year. This may be due to his obesity and some deconditioning but considering his history of Wegener's and cough I would like to assess his lungs a little bit more thoroughly.  I am reassured by the fact that today his lung exam is normal and his oximetry is normal.  Plan: -Full pulmonary function test -Chest x-ray -May need  CT scan of the chest to evaluate for pulmonary fibrosis -Discuss shortness of breath with cardiology if pulmonary workup is negative  Atypical chest pain He describes very brief episodes of shortness of mild chest pain that occurs in the left chest when he is at rest. This is not consistent with classic angina but if his pulmonary workup is negative then he should discuss this with his cardiologist. He may need to have another stress test.    Updated Medication List Outpatient Encounter Prescriptions as of 03/10/2014  Medication Sig  . amLODipine (NORVASC) 10 MG tablet Take 1 tablet (10 mg total) by mouth daily.  Marland Kitchen aspirin 81 MG  tablet Take 81 mg by mouth daily.   Marland Kitchen atorvastatin (LIPITOR) 40 MG tablet Take 1 tablet (40 mg total) by mouth daily.  . Blood Glucose Monitoring Suppl (BLOOD GLUCOSE MONITOR KIT) KIT Use as advised - One Touch Glucose Meter. Dx code: 250.00.  . fexofenadine (ALLEGRA) 180 MG tablet Take 180 mg by mouth as needed.   Marland Kitchen glimepiride (AMARYL) 4 MG tablet Take 1 tablet (4 mg total) by mouth daily before breakfast.  . glucose blood test strip Use as instructed, 1x a day - One Touch Glucose Strips. Dx code: 250.00  . LANCETS ULTRA THIN 30G MISC by Does not apply route. One Touch  . metFORMIN (GLUCOPHAGE) 1000 MG tablet Take 1 tablet (1,000 mg total) by mouth 2 (two) times daily.  Marland Kitchen nystatin cream (MYCOSTATIN) Apply 1 application topically 2 (two) times daily as needed for dry skin.  . predniSONE (DELTASONE) 10 MG tablet Take 10 mg by mouth as needed.   Marland Kitchen spironolactone (ALDACTONE) 25 MG tablet Take 1 tablet (25 mg total) by mouth daily.  Marland Kitchen telmisartan (MICARDIS) 80 MG tablet Take 1 tablet (80 mg total) by mouth daily.  . [DISCONTINUED] meclizine (ANTIVERT) 25 MG tablet Take 1 tablet (25 mg total) by mouth 3 (three) times daily as needed for dizziness.  . [DISCONTINUED] naproxen (NAPROSYN) 500 MG tablet Take one po bid x 1 week then prn pain, take with food  . [DISCONTINUED] triamcinolone cream (KENALOG) 0.1 % Apply 1 application topically 2 (two) times daily.

## 2014-03-10 NOTE — Assessment & Plan Note (Signed)
This has been a stable interval. We will check an ANCA level today. Continue when necessary prednisone.

## 2014-03-10 NOTE — Assessment & Plan Note (Signed)
He reports increasing shortness of breath over the last year. This may be due to his obesity and some deconditioning but considering his history of Wegener's and cough I would like to assess his lungs a little bit more thoroughly.  I am reassured by the fact that today his lung exam is normal and his oximetry is normal.  Plan: -Full pulmonary function test -Chest x-ray -May need CT scan of the chest to evaluate for pulmonary fibrosis -Discuss shortness of breath with cardiology if pulmonary workup is negative

## 2014-03-10 NOTE — Assessment & Plan Note (Signed)
He has upper airway cough syndrome secondary to postnasal drip. He is currently not taking any treatment for his allergic rhinitis.  Plan: -Use Nasacort -Use Allegra -On bad days use saline rinses

## 2014-03-10 NOTE — Patient Instructions (Signed)
We will see you back in 2-3 weeks in [place] We will arrange Pulmonary Function Test for you

## 2014-03-10 NOTE — Assessment & Plan Note (Signed)
He describes very brief episodes of shortness of mild chest pain that occurs in the left chest when he is at rest. This is not consistent with classic angina but if his pulmonary workup is negative then he should discuss this with his cardiologist. He may need to have another stress test.

## 2014-03-11 ENCOUNTER — Other Ambulatory Visit: Payer: Medicare Other

## 2014-03-11 LAB — CBC WITH DIFFERENTIAL/PLATELET
BASOS PCT: 0.5 % (ref 0.0–3.0)
Basophils Absolute: 0 10*3/uL (ref 0.0–0.1)
EOS ABS: 0.1 10*3/uL (ref 0.0–0.7)
EOS PCT: 1.2 % (ref 0.0–5.0)
HCT: 37.3 % — ABNORMAL LOW (ref 39.0–52.0)
HEMOGLOBIN: 12.6 g/dL — AB (ref 13.0–17.0)
Lymphocytes Relative: 24.1 % (ref 12.0–46.0)
Lymphs Abs: 1.1 10*3/uL (ref 0.7–4.0)
MCHC: 33.7 g/dL (ref 30.0–36.0)
MCV: 93.5 fl (ref 78.0–100.0)
Monocytes Absolute: 0.4 10*3/uL (ref 0.1–1.0)
Monocytes Relative: 7.8 % (ref 3.0–12.0)
NEUTROS ABS: 3 10*3/uL (ref 1.4–7.7)
NEUTROS PCT: 66.4 % (ref 43.0–77.0)
Platelets: 198 10*3/uL (ref 150.0–400.0)
RBC: 3.99 Mil/uL — AB (ref 4.22–5.81)
RDW: 13.4 % (ref 11.5–15.5)
WBC: 4.5 10*3/uL (ref 4.0–10.5)

## 2014-03-11 LAB — BRAIN NATRIURETIC PEPTIDE: Pro B Natriuretic peptide (BNP): 34 pg/mL (ref 0.0–100.0)

## 2014-03-12 LAB — ANCA SCREEN W REFLEX TITER
Atypical p-ANCA Screen: NEGATIVE
C-ANCA SCREEN: POSITIVE — AB
P-ANCA SCREEN: NEGATIVE

## 2014-03-12 LAB — ANCA TITERS

## 2014-03-13 NOTE — Progress Notes (Signed)
Quick Note:  lmtcb X1 to relay results/recs ______ 

## 2014-03-17 ENCOUNTER — Telehealth: Payer: Self-pay | Admitting: Pulmonary Disease

## 2014-03-17 ENCOUNTER — Ambulatory Visit: Payer: Self-pay | Admitting: Pulmonary Disease

## 2014-03-17 ENCOUNTER — Ambulatory Visit (INDEPENDENT_AMBULATORY_CARE_PROVIDER_SITE_OTHER)
Admission: RE | Admit: 2014-03-17 | Discharge: 2014-03-17 | Disposition: A | Discharge status: Home / Self Care | Payer: Medicare Other | Source: Ambulatory Visit | Attending: Pulmonary Disease | Admitting: Pulmonary Disease

## 2014-03-17 DIAGNOSIS — R0602 Shortness of breath: Secondary | ICD-10-CM

## 2014-03-17 LAB — PULMONARY FUNCTION TEST

## 2014-03-17 NOTE — Telephone Encounter (Signed)
lmtcb to relay results/recs

## 2014-03-17 NOTE — Telephone Encounter (Signed)
Message copied by Len Blalock on Mon Mar 17, 2014  9:58 AM ------      Message from: Simonne Maffucci B      Created: Thu Mar 13, 2014 12:33 PM       A,      Please let him know that his ANCA level was up some this visit but since he is not having joint aches I am not too concerned. However, I am concerned that he is mildly anemic.  Please tell him that he should discuss this with his PCP.      Thanks      B ------

## 2014-03-18 NOTE — Telephone Encounter (Signed)
Results have been explained to patient, pt expressed understanding. Nothing further needed.  

## 2014-03-18 NOTE — Progress Notes (Signed)
Quick Note:  Spoke with pt, he is aware of results and recs. ______

## 2014-04-02 ENCOUNTER — Other Ambulatory Visit: Payer: Self-pay | Admitting: *Deleted

## 2014-04-02 MED ORDER — METFORMIN HCL 1000 MG PO TABS: 1000.0000 mg | ORAL_TABLET | Freq: Two times a day (BID) | ORAL | Status: DC

## 2014-04-03 ENCOUNTER — Other Ambulatory Visit: Payer: Self-pay | Admitting: *Deleted

## 2014-04-03 MED ORDER — SPIRONOLACTONE 25 MG PO TABS: 25.0000 mg | ORAL_TABLET | Freq: Every day | ORAL | Status: DC

## 2014-04-21 ENCOUNTER — Telehealth: Payer: Self-pay

## 2014-04-21 NOTE — Telephone Encounter (Signed)
-----   Message from Juanito Doom, MD sent at 04/21/2014  3:23 AM EST ----- A, Please let him know that his PFT was normal Thanks B

## 2014-04-21 NOTE — Telephone Encounter (Signed)
Pt aware of results.  Nothing further needed.  

## 2014-05-26 ENCOUNTER — Other Ambulatory Visit: Payer: Self-pay | Admitting: Family Medicine

## 2014-05-29 ENCOUNTER — Encounter: Payer: Self-pay | Admitting: Internal Medicine

## 2014-05-29 ENCOUNTER — Ambulatory Visit (INDEPENDENT_AMBULATORY_CARE_PROVIDER_SITE_OTHER): Payer: Medicare Other | Admitting: Internal Medicine

## 2014-05-29 VITALS — BP 134/86 | HR 77 | Temp 98.5°F | Wt 244.0 lb

## 2014-05-29 DIAGNOSIS — J069 Acute upper respiratory infection, unspecified: Secondary | ICD-10-CM

## 2014-05-29 MED ORDER — HYDROCODONE-HOMATROPINE 5-1.5 MG/5ML PO SYRP: 5.0000 mL | ORAL_SOLUTION | Freq: Three times a day (TID) | ORAL | Status: DC | PRN

## 2014-05-29 NOTE — Patient Instructions (Signed)
Cough, Adult  A cough is a reflex that helps clear your throat and airways. It can help heal the body or may be a reaction to an irritated airway. A cough may only last 2 or 3 weeks (acute) or may last more than 8 weeks (chronic).  CAUSES Acute cough:  Viral or bacterial infections. Chronic cough:  Infections.  Allergies.  Asthma.  Post-nasal drip.  Smoking.  Heartburn or acid reflux.  Some medicines.  Chronic lung problems (COPD).  Cancer. SYMPTOMS   Cough.  Fever.  Chest pain.  Increased breathing rate.  High-pitched whistling sound when breathing (wheezing).  Colored mucus that you cough up (sputum). TREATMENT   A bacterial cough may be treated with antibiotic medicine.  A viral cough must run its course and will not respond to antibiotics.  Your caregiver may recommend other treatments if you have a chronic cough. HOME CARE INSTRUCTIONS   Only take over-the-counter or prescription medicines for pain, discomfort, or fever as directed by your caregiver. Use cough suppressants only as directed by your caregiver.  Use a cold steam vaporizer or humidifier in your bedroom or home to help loosen secretions.  Sleep in a semi-upright position if your cough is worse at night.  Rest as needed.  Stop smoking if you smoke. SEEK IMMEDIATE MEDICAL CARE IF:   You have pus in your sputum.  Your cough starts to worsen.  You cannot control your cough with suppressants and are losing sleep.  You begin coughing up blood.  You have difficulty breathing.  You develop pain which is getting worse or is uncontrolled with medicine.  You have a fever. MAKE SURE YOU:   Understand these instructions.  Will watch your condition.  Will get help right away if you are not doing well or get worse. Document Released: 11/12/2010 Document Revised: 08/08/2011 Document Reviewed: 11/12/2010 Exit[employer] Patient Information 2015 [employer]. This information is not intended  to replace advice given to you by your health care provider. Make sure you discuss any questions you have with your health care provider.  

## 2014-05-29 NOTE — Progress Notes (Signed)
Pre visit review using our clinic review tool, if applicable. No additional management support is needed unless otherwise documented below in the visit note. 

## 2014-05-29 NOTE — Progress Notes (Signed)
HPI  Pt presents to the clinic today with c/o sore throat and cough. He reports this started 2-3 days ago. He has had some pain with swallowing. His cough is productive of white mucous. He denies fever, chills or body aches. He has taken Percocet and Robitussin DM with some relief. He has not had sick contact.  Review of Systems      Past Medical History  Diagnosis Date  . Diabetes mellitus   . Hypertension   . Heart disease   . Hyperlipidemia   . Wegener's granulomatosis 2007  . Coronary artery disease     Family History  Problem Relation Age of Onset  . Cancer Mother     tumor on tonsil  . Stroke Maternal Grandfather     History   Social History  . Marital Status: Married    Spouse Name: N/A    Number of Children: N/A  . Years of Education: N/A   Occupational History  . Not on file.   Social History Main Topics  . Smoking status: Former Smoker -- 0.50 packs/day for 20 years    Types: Cigarettes    Quit date: 05/30/1978  . Smokeless tobacco: Never Used  . Alcohol Use: No  . Drug Use: No  . Sexual Activity: Not on file   Other Topics Concern  . Not on file   Social History Narrative   Regular exercise: yes   Caffeine use: very little    Allergies  Allergen Reactions  . Benicar Hct [Olmesartan Medoxomil-Hctz]     Painful and itchy knots on palms of hands  . Bystolic [Nebivolol Hcl]     headache  . Niaspan [Niacin Er] Other (See Comments)    flushing  . Septra [Bactrim] Hives     Constitutional:  Denies headache, fatigue, fever or abrupt weight changes.  HEENT:  Positive sore throat. Denies eye redness, eye pain, pressure behind the eyes, facial pain, nasal congestion, ear pain, ringing in the ears, wax buildup, runny nose or bloody nose. Respiratory: Positive cough. Denies difficulty breathing or shortness of breath.  Cardiovascular: Denies chest pain, chest tightness, palpitations or swelling in the hands or feet.   No other specific complaints in  a complete review of systems (except as listed in HPI above).  Objective:   BP 134/86 mmHg  Pulse 77  Temp(Src) 98.5 F (36.9 C) (Oral)  Wt 244 lb (110.678 kg)  SpO2 98%  Wt Readings from Last 3 Encounters:  05/29/14 244 lb (110.678 kg)  03/10/14 248 lb (112.492 kg)  12/16/13 247 lb 12 oz (112.379 kg)     General: Appears his stated age, well developed, well nourished in NAD. HEENT: Head: normal shape and size, no sinus tenderness noted; Eyes: sclera injected, no icterus, conjunctiva pin; Ears: Tm's gray and intact, normal light reflex; Nose: mucosa pink and moist, septum midline; Throat/Mouth: + PND. Teeth present, mucosa erythematous and moist, no exudate noted, no lesions or ulcerations noted.  Neck: No lymphadenopathy. Cardiovascular: Normal rate and rhythm. S1,S2 noted.  No murmur, rubs or gallops noted. Pulmonary/Chest: Normal effort and positive vesicular breath sounds. No respiratory distress. No wheezes, rales or ronchi noted.      Assessment & Plan:   Upper Respiratory Infection:  Viral Get some rest and drink plenty of water Do salt water gargles/Iburprofen for the sore throat Rx for Hycodan cough syrup  RTC as needed or if symptoms persist.

## 2014-06-01 ENCOUNTER — Telehealth: Payer: Self-pay | Admitting: Family Medicine

## 2014-06-01 DIAGNOSIS — Z125 Encounter for screening for malignant neoplasm of prostate: Secondary | ICD-10-CM

## 2014-06-01 DIAGNOSIS — E785 Hyperlipidemia, unspecified: Secondary | ICD-10-CM

## 2014-06-01 DIAGNOSIS — I10 Essential (primary) hypertension: Secondary | ICD-10-CM

## 2014-06-01 DIAGNOSIS — E119 Type 2 diabetes mellitus without complications: Secondary | ICD-10-CM

## 2014-06-01 DIAGNOSIS — M8580 Other specified disorders of bone density and structure, unspecified site: Secondary | ICD-10-CM

## 2014-06-01 NOTE — Telephone Encounter (Signed)
-----   Message from Ellamae Sia sent at 05/27/2014 10:34 AM EST ----- Regarding: Lab orders for Monday, 1.4.16 Patient is scheduled for CPX labs, please order future labs, Thanks , Karna Christmas

## 2014-06-02 ENCOUNTER — Ambulatory Visit (INDEPENDENT_AMBULATORY_CARE_PROVIDER_SITE_OTHER): Payer: Medicare HMO | Admitting: Internal Medicine

## 2014-06-02 ENCOUNTER — Encounter: Payer: Self-pay | Admitting: Internal Medicine

## 2014-06-02 ENCOUNTER — Other Ambulatory Visit (INDEPENDENT_AMBULATORY_CARE_PROVIDER_SITE_OTHER): Payer: Medicare HMO

## 2014-06-02 VITALS — BP 140/80 | HR 88 | Temp 98.5°F | Resp 14 | Wt 240.0 lb

## 2014-06-02 DIAGNOSIS — E119 Type 2 diabetes mellitus without complications: Secondary | ICD-10-CM

## 2014-06-02 DIAGNOSIS — M8580 Other specified disorders of bone density and structure, unspecified site: Secondary | ICD-10-CM

## 2014-06-02 DIAGNOSIS — E785 Hyperlipidemia, unspecified: Secondary | ICD-10-CM

## 2014-06-02 DIAGNOSIS — H01003 Unspecified blepharitis right eye, unspecified eyelid: Secondary | ICD-10-CM | POA: Insufficient documentation

## 2014-06-02 DIAGNOSIS — J209 Acute bronchitis, unspecified: Secondary | ICD-10-CM

## 2014-06-02 DIAGNOSIS — H01006 Unspecified blepharitis left eye, unspecified eyelid: Secondary | ICD-10-CM

## 2014-06-02 DIAGNOSIS — Z125 Encounter for screening for malignant neoplasm of prostate: Secondary | ICD-10-CM

## 2014-06-02 DIAGNOSIS — I10 Essential (primary) hypertension: Secondary | ICD-10-CM

## 2014-06-02 LAB — COMPREHENSIVE METABOLIC PANEL
ALT: 18 U/L (ref 0–53)
AST: 15 U/L (ref 0–37)
Albumin: 4 g/dL (ref 3.5–5.2)
Alkaline Phosphatase: 73 U/L (ref 39–117)
BUN: 16 mg/dL (ref 6–23)
CALCIUM: 9.3 mg/dL (ref 8.4–10.5)
CHLORIDE: 103 meq/L (ref 96–112)
CO2: 28 mEq/L (ref 19–32)
CREATININE: 0.9 mg/dL (ref 0.4–1.5)
GFR: 89.35 mL/min (ref >60.00)
Glucose, Bld: 143 mg/dL — ABNORMAL HIGH (ref 70–99)
Potassium: 4.5 mEq/L (ref 3.5–5.1)
Sodium: 141 mEq/L (ref 135–145)
Total Bilirubin: 0.7 mg/dL (ref 0.2–1.2)
Total Protein: 6.8 g/dL (ref 6.0–8.3)

## 2014-06-02 LAB — CBC WITH DIFFERENTIAL/PLATELET
BASOS PCT: 0.4 % (ref 0.0–3.0)
Basophils Absolute: 0 10*3/uL (ref 0.0–0.1)
Eosinophils Absolute: 0.1 10*3/uL (ref 0.0–0.7)
Eosinophils Relative: 1.4 % (ref 0.0–5.0)
HCT: 39 % (ref 39.0–52.0)
HEMOGLOBIN: 13 g/dL (ref 13.0–17.0)
LYMPHS PCT: 16.4 % (ref 12.0–46.0)
Lymphs Abs: 1.2 10*3/uL (ref 0.7–4.0)
MCHC: 33.4 g/dL (ref 30.0–36.0)
MCV: 93.3 fl (ref 78.0–100.0)
MONOS PCT: 8.6 % (ref 3.0–12.0)
Monocytes Absolute: 0.6 10*3/uL (ref 0.1–1.0)
Neutro Abs: 5.5 10*3/uL (ref 1.4–7.7)
Neutrophils Relative %: 73.2 % (ref 43.0–77.0)
Platelets: 229 10*3/uL (ref 150.0–400.0)
RBC: 4.17 Mil/uL — AB (ref 4.22–5.81)
RDW: 13.6 % (ref 11.5–15.5)
WBC: 7.5 10*3/uL (ref 4.0–10.5)

## 2014-06-02 LAB — VITAMIN D 25 HYDROXY (VIT D DEFICIENCY, FRACTURES): VITD: 27.61 ng/mL — AB (ref 30.00–100.00)

## 2014-06-02 LAB — TSH: TSH: 0.87 u[IU]/mL (ref 0.35–4.50)

## 2014-06-02 LAB — LIPID PANEL
Cholesterol: 134 mg/dL (ref 0–200)
HDL: 36.7 mg/dL — ABNORMAL LOW (ref >39.00)
LDL CALC: 68 mg/dL (ref 0–99)
NONHDL: 97.3
Total CHOL/HDL Ratio: 4
Triglycerides: 148 mg/dL (ref 0.0–149.0)
VLDL: 29.6 mg/dL (ref 0.0–40.0)

## 2014-06-02 LAB — HEMOGLOBIN A1C: Hgb A1c MFr Bld: 8.1 % — ABNORMAL HIGH (ref 4.6–6.5)

## 2014-06-02 LAB — PSA: PSA: 0.83 ng/mL (ref 0.10–4.00)

## 2014-06-02 MED ORDER — AMOXICILLIN 500 MG PO TABS: 1000.0000 mg | ORAL_TABLET | Freq: Two times a day (BID) | ORAL | Status: DC

## 2014-06-02 MED ORDER — ERYTHROMYCIN 5 MG/GM OP OINT: 1.0000 "application " | TOPICAL_OINTMENT | Freq: Four times a day (QID) | OPHTHALMIC | Status: DC

## 2014-06-02 NOTE — Assessment & Plan Note (Signed)
Copious discharge Will try erythro ointment To ophtho if not better soon

## 2014-06-02 NOTE — Patient Instructions (Signed)
Please see your eye doctor by Wednesday or Thursday if your eyes aren't improving. Call here if the cough is not better within 3 days---I will consider a different medication.

## 2014-06-02 NOTE — Progress Notes (Signed)
Subjective:    Patient ID: Ryan Morgan, male    DOB: December 21, 1940, 74 y.o.   MRN: [id number]  HPI Here due to changes in respiratory infection  Now has build up in eyes They crust after lying down for just 30 minutes  Increased cough---now with green and yellow sputum No SOB No fever Not sure if he has PND No head pressure  Has sore throat--but mostly resolved  Using robitussin DM, Rx cough syrup---didn't help  Current Outpatient Prescriptions on File Prior to Visit  Medication Sig Dispense Refill  . amLODipine (NORVASC) 10 MG tablet Take 1 tablet (10 mg total) by mouth daily. 90 tablet 3  . aspirin 81 MG tablet Take 81 mg by mouth daily.     Marland Kitchen atorvastatin (LIPITOR) 40 MG tablet TAKE 1 BY MOUTH DAILY 90 tablet 0  . fexofenadine (ALLEGRA) 180 MG tablet Take 180 mg by mouth as needed.     Marland Kitchen glimepiride (AMARYL) 4 MG tablet Take 1 tablet (4 mg total) by mouth daily before breakfast. 90 tablet 1  . glucose blood test strip Use as instructed, 1x a day - One Touch Glucose Strips. Dx code: 250.00    . HYDROcodone-homatropine (HYCODAN) 5-1.5 MG/5ML syrup Take 5 mLs by mouth every 8 (eight) hours as needed for cough. 120 mL 0  . LANCETS ULTRA THIN 30G MISC by Does not apply route. One Touch    . metFORMIN (GLUCOPHAGE) 1000 MG tablet Take 1 tablet (1,000 mg total) by mouth 2 (two) times daily. 180 tablet 0  . nystatin cream (MYCOSTATIN) Apply 1 application topically 2 (two) times daily as needed for dry skin.    Marland Kitchen spironolactone (ALDACTONE) 25 MG tablet Take 1 tablet (25 mg total) by mouth daily. 90 tablet 1  . telmisartan (MICARDIS) 80 MG tablet Take 1 tablet (80 mg total) by mouth daily. 90 tablet 1  . [DISCONTINUED] nebivolol (BYSTOLIC) 5 MG tablet Take 1 tablet (5 mg total) by mouth 2 (two) times daily. 180 tablet 3   No current facility-administered medications on file prior to visit.    Allergies  Allergen Reactions  . Benicar Hct [Olmesartan Medoxomil-Hctz]     Painful and  itchy knots on palms of hands  . Bystolic [Nebivolol Hcl]     headache  . Niaspan [Niacin Er] Other (See Comments)    flushing  . Septra [Bactrim] Hives    Past Medical History  Diagnosis Date  . Diabetes mellitus   . Hypertension   . Heart disease   . Hyperlipidemia   . Wegener's granulomatosis 2007  . Coronary artery disease     Past Surgical History  Procedure Laterality Date  . Basal cell carcinoma excision      removed from face x 3  . 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 Cardiac surgery  2000    stent in heart  . Cardiac catheterization    . Coronary angioplasty  09/22/1998    s/p stent placement; [facility] 3.0 x 18 mm Ref [id number]    Family History  Problem Relation Age of Onset  . Cancer Mother     tumor on tonsil  . Stroke Maternal Grandfather     History   Social History  . Marital Status: Married    Spouse Name: N/A    Number of Children: N/A  . Years of Education: N/A   Occupational History  . Not on file.   Social History Main  Topics  . Smoking status: Former Smoker -- 0.50 packs/day for 20 years    Types: Cigarettes    Quit date: 05/30/1978  . Smokeless tobacco: Never Used  . Alcohol Use: No  . Drug Use: No  . Sexual Activity: Not on file   Other Topics Concern  . Not on file   Social History Narrative   Regular exercise: yes   Caffeine use: very little   Review of Systems Did take some prednisone about a week ago-- 30/20/10/10 Had fluid buildup in right ear--better now (relates to Wegener's) No hematemesis    Objective:   Physical Exam  Constitutional: He appears well-developed and well-nourished. No distress.  HENT:  Mouth/Throat: Oropharynx is clear and moist. No oropharyngeal exudate.  TMs fine Moderate nasal inflammation No sinus tenderness  Eyes:  Blepharitis with copious yellow discharge Slight bulbar conjunctival injection  Neck: Normal range of motion. Neck supple.  Pulmonary/Chest: Effort  normal. No respiratory distress. He has no wheezes. He has no rales.  Slight rhonchi bilaterally  Lymphadenopathy:    He has no cervical adenopathy.          Assessment & Plan:

## 2014-06-02 NOTE — Progress Notes (Signed)
Pre visit review using our clinic review tool, if applicable. No additional management support is needed unless otherwise documented below in the visit note. 

## 2014-06-02 NOTE — Assessment & Plan Note (Signed)
Ongoing resp illness with purulent sputum now No evidence of pneumonia With eye findings---sinusitis considered--but doesn't have the other symptoms Will cover with amoxil though, just in case

## 2014-06-09 ENCOUNTER — Encounter: Payer: Self-pay | Admitting: Family Medicine

## 2014-06-09 ENCOUNTER — Ambulatory Visit (INDEPENDENT_AMBULATORY_CARE_PROVIDER_SITE_OTHER): Payer: Medicare HMO | Admitting: Family Medicine

## 2014-06-09 VITALS — BP 128/62 | HR 74 | Temp 98.2°F | Ht 70.0 in | Wt 238.8 lb

## 2014-06-09 DIAGNOSIS — E669 Obesity, unspecified: Secondary | ICD-10-CM

## 2014-06-09 DIAGNOSIS — E119 Type 2 diabetes mellitus without complications: Secondary | ICD-10-CM

## 2014-06-09 DIAGNOSIS — I10 Essential (primary) hypertension: Secondary | ICD-10-CM

## 2014-06-09 DIAGNOSIS — Z1211 Encounter for screening for malignant neoplasm of colon: Secondary | ICD-10-CM | POA: Insufficient documentation

## 2014-06-09 DIAGNOSIS — Z23 Encounter for immunization: Secondary | ICD-10-CM

## 2014-06-09 DIAGNOSIS — Z125 Encounter for screening for malignant neoplasm of prostate: Secondary | ICD-10-CM

## 2014-06-09 DIAGNOSIS — H9201 Otalgia, right ear: Secondary | ICD-10-CM | POA: Insufficient documentation

## 2014-06-09 DIAGNOSIS — Z0000 Encounter for general adult medical examination without abnormal findings: Secondary | ICD-10-CM

## 2014-06-09 MED ORDER — ATORVASTATIN CALCIUM 40 MG PO TABS: ORAL_TABLET | ORAL | Status: DC

## 2014-06-09 MED ORDER — GLIMEPIRIDE 4 MG PO TABS: 4.0000 mg | ORAL_TABLET | Freq: Every day | ORAL | Status: DC

## 2014-06-09 MED ORDER — AMLODIPINE BESYLATE 10 MG PO TABS: 10.0000 mg | ORAL_TABLET | Freq: Every day | ORAL | Status: DC

## 2014-06-09 MED ORDER — SPIRONOLACTONE 25 MG PO TABS: 25.0000 mg | ORAL_TABLET | Freq: Every day | ORAL | Status: DC

## 2014-06-09 MED ORDER — METFORMIN HCL 1000 MG PO TABS: 1000.0000 mg | ORAL_TABLET | Freq: Two times a day (BID) | ORAL | Status: DC

## 2014-06-09 MED ORDER — MOMETASONE FUROATE 50 MCG/ACT NA SUSP: 2.0000 | Freq: Every day | NASAL | Status: DC

## 2014-06-09 NOTE — Assessment & Plan Note (Signed)
Discussed how this problem influences overall health and the risks it imposes  Reviewed plan for weight loss with lower calorie diet (via better food choices and also portion control or program like weight watchers) and exercise building up to or more than 30 minutes 5 days per week including some aerobic activity    

## 2014-06-09 NOTE — Assessment & Plan Note (Signed)
ifob stool card  Pt has hx of mild iron def (per him lifelong and without cause)  Will stop any iron supplementation before doing the ifob

## 2014-06-09 NOTE — Assessment & Plan Note (Signed)
Lab Results  Component Value Date   HGBA1C 8.1* 06/02/2014   Not well controlled Overdue for f/u with Dr Cruzita Lederer (endocrinology)  Refilled med short term Disc low glycemic diet  Recommend DM education

## 2014-06-09 NOTE — Assessment & Plan Note (Signed)
Reviewed health habits including diet and exercise and skin cancer prevention Reviewed appropriate screening tests for age  Also reviewed health mt list, fam hx and immunization status , as well as social and family history   See HPI and AVS  prevnar vaccine  Given ifob stool card Enc to work on [employer] and gave him a packet to read  Labs reviewed

## 2014-06-09 NOTE — Assessment & Plan Note (Signed)
bp in fair control at this time  BP Readings from Last 1 Encounters:  06/09/14 128/62   No changes needed Disc lifstyle change with low sodium diet and exercise   Labs reviewed

## 2014-06-09 NOTE — Assessment & Plan Note (Signed)
Suspect from ETD following uri  Px nasonex which has worked well for him in the past  Update if not starting to improve in a week or if worsening  Or if inc ear pain or any fever

## 2014-06-09 NOTE — Progress Notes (Signed)
Pre visit review using our clinic review tool, if applicable. No additional management support is needed unless otherwise documented below in the visit note. 

## 2014-06-09 NOTE — Patient Instructions (Signed)
Use nasonex as directed to help ear and let me know in 2 weeks if not improved  If nasonex is not covered - buy flonase over the counter  prevnar - vaccine today  Don't forget to work on an advanced directive -I gave you a packet to read on that  Do the IFOB stool card for colon cancer screening - hold iron for 1 week before you do the card  Stop at check out for referral to Dr Cruzita Lederer for diabetes  Stop juice/ watch carbs and sugars and keep exercising  [facility] has a great diabetic education program -think about it

## 2014-06-09 NOTE — Assessment & Plan Note (Signed)
No symptoms or family history of prostate cancer Lab Results  Component Value Date   PSA 0.83 06/02/2014   PSA 0.61 02/26/2013   PSA 1.08 07/11/2011    Will continue to monitor

## 2014-06-09 NOTE — Progress Notes (Signed)
Subjective:    Patient ID: Ryan Morgan, male    DOB: [birth year], 74 y.o.   MRN: [id number]  HPI Here for annual medicare wellness visit as well as acute/chronic medical problems   I have personally reviewed the Medicare Annual Wellness questionnaire and have noted 1. The patient's medical and social history 2. Their use of alcohol, tobacco or illicit drugs 3. Their current medications and supplements 4. The patient's functional ability including ADL's, fall risks, home safety risks and hearing or visual             impairment. 5. Diet and physical activities 6. Evidence for depression or mood disorders  The patients weight, height, BMI have been recorded in the chart and visual acuity is per eye clinic.  I have made referrals, counseling and provided education to the patient based review of the above and I have provided the pt with a written personalized care plan for preventive services.  See scanned forms.  Routine anticipatory guidance given to patient.  See health maintenance. Colon cancer screening 1/08 - no polyps and no family history  10/14 - normal dexa   Flu vaccine 10/15 Tetanus vaccine 1/10 Pneumovax 2/13 , will get prevnar today  Zoster vaccine- declines  Prostate cancer screening  Lab Results  Component Value Date   PSA 0.83 06/02/2014   PSA 0.61 02/26/2013   PSA 1.08 07/11/2011   no problems at all with urination , no family hx of prostate cancer  Advance directive - does not have one , given the handout  Cognitive function addressed- see scanned forms- and if abnormal then additional documentation follows. , no memory issues   PMH and SH reviewed  Meds, vitals, and allergies reviewed.   ROS: See HPI.  Otherwise negative.    Wt is down 2 lb with bmi of 34  Hyperlipidemia lipitor and diet  Lab Results  Component Value Date   CHOL 134 06/02/2014   CHOL 131 02/26/2013   CHOL 127 08/13/2012   Lab Results  Component Value Date   HDL 36.70*  06/02/2014   HDL 35.30* 02/26/2013   HDL 31.30* 08/13/2012   Lab Results  Component Value Date   LDLCALC 68 06/02/2014   LDLCALC 57 02/26/2013   LDLCALC 57 10/17/2011   Lab Results  Component Value Date   TRIG 148.0 06/02/2014   TRIG 195.0* 02/26/2013   TRIG 221.0* 08/13/2012   Lab Results  Component Value Date   CHOLHDL 4 06/02/2014   CHOLHDL 4 02/26/2013   CHOLHDL 4 08/13/2012    Overall stable   Hx of iron def in the past  Lab Results  Component Value Date   WBC 7.5 06/02/2014   HGB 13.0 06/02/2014   HCT 39.0 06/02/2014   MCV 93.3 06/02/2014   PLT 229.0 06/02/2014   he states he had iron from age [age] years on and off  Never told why he was iron deficient        Lab Results  Component Value Date   LDLDIRECT 65.7 08/13/2012    DM2 Lab Results  Component Value Date   HGBA1C 8.1* 06/02/2014   Up from 7.4  opthy 6/15  On amaryl and metformin  Has not had any amaryl in 2 weeks  Dr Cruzita Lederer -needs an appt  He is eating less than he used to eat  Is has drank juice , very little bread/pasta and potato  Eats some veg - some greens and turnips  Has never done  his own DM teaching (did it with his wife)   Gets a lot of exercise - uses his treadmill / has a bike too    Has had uri  His R ear feels stopped up still - like it is trying to drain /popping a lot  Not too congested  He uses nasacort nasal spray and used to use nasonex    Patient Active Problem List   Diagnosis Date Noted  . Right ear pain 06/09/2014  . Colon cancer screening 06/09/2014  . Acute bronchitis 06/02/2014  . Blepharitis, bilateral 06/02/2014  . Shortness of breath 03/10/2014  . Atypical chest pain 03/10/2014  . Benign paroxysmal positional vertigo 12/14/2013  . Bursitis of right hip 10/17/2013  . Coronary atherosclerosis of native coronary artery 03/19/2013  . Encounter for Medicare annual wellness exam 03/05/2013  . 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 Osteopenia 03/05/2013  . Obesity 07/15/2011  . Prostate cancer screening 07/10/2011  . Headache on top of head 06/29/2011  . Cough 12/23/2010  . Hypertension 08/30/2010  . Hyperlipemia 08/30/2010  . Diabetes mellitus, type 2 08/30/2010  . 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 Diabetes mellitus   . Hypertension   . Heart disease   . Hyperlipidemia   . Wegener's granulomatosis 2007  . Coronary artery disease    Past Surgical History  Procedure Laterality Date  . Basal cell carcinoma excision      removed from face x 3  . 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 Cardiac surgery  2000    stent in heart  . Cardiac catheterization    . Coronary angioplasty  09/22/1998    s/p stent placement; [facility] 3.0 x 18 mm Ref [id number]   History  Substance Use Topics  . Smoking status: Former Smoker -- 0.50 packs/day for 20 years    Types: Cigarettes    Quit date: 05/30/1978  . Smokeless tobacco: Never Used  . Alcohol Use: No   Family History  Problem Relation Age of Onset  . Cancer Mother     tumor on tonsil  . Stroke Maternal Grandfather    Allergies  Allergen Reactions  . Benicar Hct [Olmesartan Medoxomil-Hctz]     Painful and itchy knots on palms of hands  . Bystolic [Nebivolol Hcl]     headache  . Niaspan [Niacin Er] Other (See Comments)    flushing  . Septra [Bactrim] Hives   Current Outpatient Prescriptions on File Prior to Visit  Medication Sig Dispense Refill  . aspirin 81 MG tablet Take 81 mg by mouth daily.     . fexofenadine (ALLEGRA) 180 MG tablet Take 180 mg by mouth as needed.     Marland Kitchen glucose blood test strip Use as instructed, 1x a day - One Touch Glucose Strips. Dx code: 250.00    . LANCETS ULTRA THIN 30G MISC by Does not apply route. One Touch    . nystatin cream (MYCOSTATIN) Apply 1 application topically 2 (two) times daily as needed for dry skin.    Marland Kitchen telmisartan (MICARDIS) 80 MG  tablet Take 1 tablet (80 mg total) by mouth daily. 90 tablet 1  . [DISCONTINUED] nebivolol (BYSTOLIC) 5 MG tablet Take 1 tablet (5 mg total) by mouth 2 (two) times daily. 180 tablet 3   No current facility-administered medications on file prior to visit.    Review of Systems Review of Systems  Constitutional: Negative for fever, appetite change,  and unexpected weight change.  Eyes: Negative for pain and visual disturbance.  ENT pos for R ear discomfort and fullness, pos for mild nasal congestion  Respiratory: Negative for cough and shortness of breath.   Cardiovascular: Negative for cp or palpitations    Gastrointestinal: Negative for nausea, diarrhea and constipation.  Genitourinary: Negative for urgency and frequency.  Skin: Negative for pallor or rash   Neurological: Negative for weakness, light-headedness, numbness and headaches.  Hematological: Negative for adenopathy. Does not bruise/bleed easily.  Psychiatric/Behavioral: Negative for dysphoric mood. The patient is not nervous/anxious.         Objective:   Physical Exam  Constitutional: He appears well-developed and well-nourished. No distress.  obese and well appearing   HENT:  Head: Normocephalic and atraumatic.  Right Ear: External ear normal.  Left Ear: External ear normal.  Nose: Nose normal.  Mouth/Throat: Oropharynx is clear and moist.  Dull TMs bilat Nares are boggy with some clear rhinorrhea  No sinus tenderness  Eyes: Conjunctivae and EOM are normal. Pupils are equal, round, and reactive to light. Right eye exhibits no discharge. Left eye exhibits no discharge. No scleral icterus.  Neck: Normal range of motion. Neck supple. No JVD present. Carotid bruit is not present. No thyromegaly present.  Cardiovascular: Normal rate, regular rhythm, normal heart sounds and intact distal pulses.  Exam reveals no gallop.   Pulmonary/Chest: Effort normal and breath sounds normal. No respiratory distress. He has no wheezes. He  exhibits no tenderness.  Abdominal: Soft. Bowel sounds are normal. He exhibits no distension, no abdominal bruit and no mass. There is no tenderness.  Musculoskeletal: He exhibits no edema or tenderness.  Lymphadenopathy:    He has no cervical adenopathy.  Neurological: He is alert. He has normal reflexes. No cranial nerve deficit. He exhibits normal muscle tone. Coordination normal.  Skin: Skin is warm and dry. No rash noted. No erythema. No pallor.  Psychiatric: He has a normal mood and affect.          Assessment & Plan:   Problem List Items Addressed This Visit      Cardiovascular and Mediastinum   Hypertension    bp in fair control at this time  BP Readings from Last 1 Encounters:  06/09/14 128/62   No changes needed Disc lifstyle change with low sodium diet and exercise   Labs reviewed     Relevant Medications      spironolactone (ALDACTONE) tablet      atorvastatin (LIPITOR) tablet      amLODIpine (NORVASC) tablet     Endocrine   Diabetes mellitus, type 2    Lab Results  Component Value Date   HGBA1C 8.1* 06/02/2014   Not well controlled Overdue for f/u with Dr Cruzita Lederer (endocrinology)  Refilled med short term Disc low glycemic diet  Recommend DM education      Relevant Medications      glimepiride (AMARYL) tablet      atorvastatin (LIPITOR) tablet      metFORMIN (GLUCOPHAGE) tablet   Other Relevant Orders      Ambulatory referral to Endocrinology     Other   Colon cancer screening    ifob stool card  Pt has hx of mild iron def (per him lifelong and without cause)  Will stop any iron supplementation before doing the ifob     Relevant Orders      Fecal occult blood, imunochemical   Encounter for Medicare annual  wellness exam - Primary    Reviewed health habits including diet and exercise and skin cancer prevention Reviewed appropriate screening tests for age  Also reviewed health mt list, fam hx and immunization status , as well as social and  family history   See HPI and AVS  prevnar vaccine  Given ifob stool card 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  Labs reviewed     Obesity    Discussed how this problem influences overall health and the risks it imposes  Reviewed plan for weight loss with lower calorie diet (via better food choices and also portion control or program like weight watchers) and exercise building up to or more than 30 minutes 5 days per week including some aerobic activity       Relevant Medications      glimepiride (AMARYL) tablet      metFORMIN (GLUCOPHAGE) tablet   Prostate cancer screening    No symptoms or family history of prostate cancer Lab Results  Component Value Date   PSA 0.83 06/02/2014   PSA 0.61 02/26/2013   PSA 1.08 07/11/2011    Will continue to monitor     Right ear pain    Suspect from ETD following uri  Px nasonex which has worked well for him in the past  Update if not starting to improve in a week or if worsening  Or if inc ear pain or any fever      Other Visit Diagnoses    Need for vaccination with 13-polyvalent pneumococcal conjugate vaccine        Relevant Orders       Pneumococcal conjugate vaccine 13-valent (Completed)

## 2014-06-10 ENCOUNTER — Telehealth: Payer: Self-pay | Admitting: Family Medicine

## 2014-06-10 NOTE — Telephone Encounter (Signed)
emmi emailed °

## 2014-06-11 ENCOUNTER — Ambulatory Visit (INDEPENDENT_AMBULATORY_CARE_PROVIDER_SITE_OTHER): Payer: Medicare HMO | Admitting: Cardiovascular Disease

## 2014-06-11 ENCOUNTER — Encounter: Payer: Self-pay | Admitting: Cardiovascular Disease

## 2014-06-11 VITALS — BP 124/64 | HR 76 | Ht 70.0 in | Wt 242.5 lb

## 2014-06-11 DIAGNOSIS — E119 Type 2 diabetes mellitus without complications: Secondary | ICD-10-CM

## 2014-06-11 DIAGNOSIS — I2510 Atherosclerotic heart disease of native coronary artery without angina pectoris: Secondary | ICD-10-CM

## 2014-06-11 DIAGNOSIS — E785 Hyperlipidemia, unspecified: Secondary | ICD-10-CM

## 2014-06-11 DIAGNOSIS — R0789 Other chest pain: Secondary | ICD-10-CM

## 2014-06-11 DIAGNOSIS — I10 Essential (primary) hypertension: Secondary | ICD-10-CM

## 2014-06-11 DIAGNOSIS — E669 Obesity, unspecified: Secondary | ICD-10-CM

## 2014-06-11 NOTE — Progress Notes (Signed)
Patient ID: Ryan Morgan, male    DOB: [birth year], 74 y.o.   MRN: [id number]  HPI Comments: [name] is a very pleasant 74 year old gentleman with a history of coronary artery disease, stent placed to his LAD in 2000, repeat catheterization in January 2003 with 40% LAD, 50% diagonal disease, normal ejection fraction who presents for follow-up of his coronary artery disease. He has a long history of Wegener's granulomatosis, diabetes. Prior thoracotomy surgery on the left for lung nodule, chronic pain on the left that is episodic, stable over several years He is on chronic prednisone for Wegener's  In follow-up today, he is doing well. No recent episodes of shortness of breath or chest pain concerning for angina.  Previously in 2000, he had a cold chill and did not feel well. Further workup with cardiac enzymes suggested  cardiac issues and he had a catheterization.  He reports getting over a upper respiratory infection requiring antibiotics. Still has right ear pressure He was without his diabetes medication for one month and hemoglobin A1c climbed up to 8.1 He continues to have sharp pain on the left chest lasting for up to 15 minutes, several episodes per month. This has been the same for many years. Some point tenderness on the bottom part of his ribs. He attributes this to prior thoracotomy surgery  EKG on today's visit shows normal sinus rhythm with rate 76 bpm, no significant ST or T-wave changes  Other past medical history He reports having a stress test in 2009 in [place] reports was normal. He is active, does some exercise but not irregular exercise program. He's been trying to work on his weight. This continues to be a struggle. Hemoglobin A1c 7.1 by his report. He is tolerating his other medications well. Blood pressures typically well controlled, more elevated today.  Stress test from January 2009 was a treadmill study, ejection fraction estimated at 45%, with medium-sized area of  moderate to severe hypoperfusion involving the septal region.  Allergies  Allergen Reactions  . Benicar Hct [Olmesartan Medoxomil-Hctz]     Painful and itchy knots on palms of hands  . Bystolic [Nebivolol Hcl]     headache  . Niaspan [Niacin Er] Other (See Comments)    flushing  . Septra [Bactrim] Hives    Outpatient Encounter Prescriptions as of 06/11/2014  Medication Sig  . amLODipine (NORVASC) 10 MG tablet Take 1 tablet (10 mg total) by mouth daily.  Marland Kitchen aspirin 81 MG tablet Take 81 mg by mouth daily.   Marland Kitchen atorvastatin (LIPITOR) 40 MG tablet TAKE 1 BY MOUTH DAILY  . fexofenadine (ALLEGRA) 180 MG tablet Take 180 mg by mouth as needed.   . fluticasone (FLONASE) 50 MCG/ACT nasal spray Place into both nostrils daily.  Marland Kitchen glimepiride (AMARYL) 4 MG tablet Take 1 tablet (4 mg total) by mouth daily before breakfast.  . glucose blood test strip Use as instructed, 1x a day - One Touch Glucose Strips. Dx code: 250.00  . LANCETS ULTRA THIN 30G MISC by Does not apply route. One Touch  . metFORMIN (GLUCOPHAGE) 1000 MG tablet Take 1 tablet (1,000 mg total) by mouth 2 (two) times daily.  . mometasone (NASONEX) 50 MCG/ACT nasal spray Place 2 sprays into the nose daily.  Marland Kitchen nystatin cream (MYCOSTATIN) Apply 1 application topically 2 (two) times daily as needed for dry skin.  . predniSONE (DELTASONE) 10 MG tablet Take 10 mg by mouth daily with breakfast.  . spironolactone (ALDACTONE) 25 MG tablet Take 1 tablet (  25 mg total) by mouth daily.  Marland Kitchen telmisartan (MICARDIS) 80 MG tablet Take 1 tablet (80 mg total) by mouth daily.    Past Medical History  Diagnosis Date  . Diabetes mellitus   . Hypertension   . Heart disease   . Hyperlipidemia   . Wegener's granulomatosis 2007  . Coronary artery disease     Past Surgical History  Procedure Laterality Date  . Basal cell carcinoma excision      removed from face x 3  . 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 Cardiac surgery   2000    stent in heart  . Cardiac catheterization    . Coronary angioplasty  09/22/1998    s/p stent placement; Tristar 3.0 x 18 mm Ref [id number]    Social History  reports that he quit smoking about 36 years ago. His smoking use included Cigarettes. He has a 10 pack-year smoking history. He has never used smokeless tobacco. He reports that he does not drink alcohol or use illicit drugs.  Family History family history includes Cancer in his mother; Stroke in his maternal grandfather.   Review of Systems  Constitutional: Negative.   HENT: Positive for ear pain.   Respiratory: Negative.   Cardiovascular: Positive for chest pain.  Gastrointestinal: Negative.   Endocrine: Negative.   Musculoskeletal: Negative.   Skin: Negative.   Neurological: Negative.   Hematological: Negative.   Psychiatric/Behavioral: Negative.   All other systems reviewed and are negative.   BP 124/64 mmHg  Pulse 76  Ht 5\' 10"  (1.778 m)  Wt 242 lb 8 oz (109.997 kg)  BMI 34.80 kg/m2  Physical Exam  Constitutional: He is oriented to person, place, and time. He appears well-developed and well-nourished.  HENT:  Head: Normocephalic.  Nose: Nose normal.  Mouth/Throat: Oropharynx is clear and moist.  Eyes: Conjunctivae are normal. Pupils are equal, round, and reactive to light.  Neck: Normal range of motion. Neck supple. No JVD present.  Cardiovascular: Normal rate, regular rhythm, S1 normal, S2 normal, normal heart sounds and intact distal pulses.  Exam reveals no gallop and no friction rub.   No murmur heard. Pulmonary/Chest: Effort normal and breath sounds normal. No respiratory distress. He has no wheezes. He has no rales. He exhibits no tenderness.  Abdominal: Soft. Bowel sounds are normal. He exhibits no distension. There is no tenderness.  Musculoskeletal: Normal range of motion. He exhibits no edema or tenderness.  Lymphadenopathy:    He has no cervical adenopathy.  Neurological: He is alert and  oriented to person, place, and time. Coordination normal.  Skin: Skin is warm and dry. No rash noted. No erythema.  Psychiatric: He has a normal mood and affect. His behavior is normal. Judgment and thought content normal.      Assessment and Plan   Nursing note and vitals reviewed.

## 2014-06-11 NOTE — Assessment & Plan Note (Addendum)
Muscular skeletal chest pain on the left, likely secondary to previous thoracotomy

## 2014-06-11 NOTE — Assessment & Plan Note (Signed)
Currently with no symptoms of angina. No further workup at this time. Continue current medication regimen. 

## 2014-06-11 NOTE — Assessment & Plan Note (Signed)
Blood pressure is well controlled on today's visit. No changes made to the medications. 

## 2014-06-11 NOTE — Assessment & Plan Note (Signed)
Stress to him the importance of weight loss, diet, portion control, exercise

## 2014-06-11 NOTE — Patient Instructions (Signed)
You are doing well. No medication changes were made.  Watch the carbos!!  Please call us if you have new issues that need to be addressed before your next appt.  Your physician wants you to follow-up in: 12 months.  You will receive a reminder letter in the mail two months in advance. If you don't receive a letter, please call our office to schedule the follow-up appointment.

## 2014-06-11 NOTE — Assessment & Plan Note (Signed)
He is back on his medications. We have encouraged continued exercise, careful diet management in an effort to lose weight.

## 2014-06-11 NOTE — Assessment & Plan Note (Signed)
Cholesterol is at goal on the current lipid regimen. No changes to the medications were made.  

## 2014-06-16 ENCOUNTER — Other Ambulatory Visit: Payer: Self-pay

## 2014-06-16 ENCOUNTER — Other Ambulatory Visit: Payer: Self-pay | Admitting: Pulmonary Disease

## 2014-06-16 MED ORDER — PREDNISONE 10 MG PO TABS: 10.0000 mg | ORAL_TABLET | Freq: Every day | ORAL | Status: DC

## 2014-07-02 ENCOUNTER — Ambulatory Visit: Payer: Medicare HMO | Admitting: Endocrinology

## 2014-07-03 ENCOUNTER — Ambulatory Visit (INDEPENDENT_AMBULATORY_CARE_PROVIDER_SITE_OTHER): Payer: Medicare HMO | Admitting: Endocrinology

## 2014-07-03 ENCOUNTER — Encounter: Payer: Self-pay | Admitting: Endocrinology

## 2014-07-03 VITALS — BP 132/72 | HR 82 | Resp 12 | Ht 70.0 in | Wt 243.5 lb

## 2014-07-03 DIAGNOSIS — E119 Type 2 diabetes mellitus without complications: Secondary | ICD-10-CM

## 2014-07-03 DIAGNOSIS — I10 Essential (primary) hypertension: Secondary | ICD-10-CM

## 2014-07-03 DIAGNOSIS — E785 Hyperlipidemia, unspecified: Secondary | ICD-10-CM

## 2014-07-03 LAB — HM DIABETES FOOT EXAM: HM Diabetic Foot Exam: NORMAL

## 2014-07-03 MED ORDER — GLUCOSE BLOOD VI STRP: ORAL_STRIP | Status: DC

## 2014-07-03 NOTE — Progress Notes (Signed)
Reason for visit-  Ryan Morgan is a 74 y.o.-year-old male, referred by his PCP,  Tower, Wynelle Fanny, MD for management of Type 2 diabetes, uncontrolled, without complications. Last visit with Dr Cruzita Lederer Nov 2014.   *sick around Christmas 2015 with bronchitis - treated with abx, then ear effusion, then prednisone tapers over  Dec-Jan 2016 - plans to be off it next week. Right now taking Prednisone 20 x 1, 10 mg x 5 days, then stop  HPI- Patient has been diagnosed with diabetes in 2000 after being on steroids for Wegners Granulomatosis. Recalls being initially on lifestyle modifications.  Tried  Metformin, Glimeperide, Actos in the past. he has not been on insulin before- doesn't wish to try injectables.   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 Uses no glucometer. By recall/meter download/meter review they are:  PREMEAL Breakfast Lunch Dinner Bedtime Overall  Glucose range:     n/a  Mean/median:        POST-MEAL PC Breakfast PC Lunch PC Dinner  Glucose range:     Mean/median:       Hypoglycemia-  No lows. Lowest sugar was n/a; he has hypoglycemia awareness at 70.   Dietary habits- eats three times daily. Tries to limit carbs, sweetened beverages, sodas, desserts.  Exercise- not in past 2 months, prior to that TD 2 x weekly and works outside  Weight - lost 40 lbs in past 1.5 years through lifestyle changes Wt Readings from Last 3 Encounters:  07/03/14 243 lb 8 oz (110.451 kg)  06/11/14 242 lb 8 oz (109.997 kg)  06/09/14 238 lb 12 oz (108.296 kg)    Diabetes Complications-  Nephropathy- No  CKD, last BUN/creatinine-  Lab Results  Component Value Date   BUN 16  06/02/2014   CREATININE 0.9 06/02/2014   Lab Results  Component Value Date   GFR 89.35 06/02/2014   No results found for: MICRALBCREAT    Retinopathy- No, Last DEE was in April 2015- has appt for this April Neuropathy- occ numbness and tingling in his feet after his surgery for Wegners. No known neuropathy.  Associated history - Has CAD s/p stent 2000 . No prior stroke. No hypothyroidism. his last TSH was  Lab Results  Component Value Date   TSH 0.87 06/02/2014    Hyperlipidemia-  his last set of lipids were- Currently on Lipitor 40 mg. Tolerating well.   Lab Results  Component Value Date   CHOL 134 06/02/2014   HDL 36.70* 06/02/2014   LDLCALC 68 06/02/2014   LDLDIRECT 65.7 08/13/2012   TRIG 148.0 06/02/2014   CHOLHDL 4 06/02/2014   Lab Results  Component Value Date   AST 15 06/02/2014   ALT 18 06/02/2014    Blood Pressure/HTN- Patient's blood pressure is well controlled today on current regimen that includes ARB ( micardis).  I have reviewed the patient's past medical history, family and social history, surgical history, medications and allergies.  Past Medical History  Diagnosis Date  . Diabetes mellitus   . Hypertension   . Heart disease   . Hyperlipidemia   . Wegener's granulomatosis 2007  .  Coronary artery disease    Past Surgical History  Procedure Laterality Date  . Basal cell carcinoma excision      removed from face x 3  . 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 Cardiac surgery  2000    stent in heart  . Cardiac catheterization    . Coronary angioplasty  09/22/1998    s/p stent placement; Tristar 3.0 x 18 mm Ref [id number]   Family History  Problem Relation Age of Onset  . Cancer Mother     tumor on tonsil  . Stroke Maternal Grandfather    History   Social History  . Marital Status: Married    Spouse Name: N/A    Number of Children: N/A  . Years of Education: N/A   Occupational History  . Not on file.   Social  History Main Topics  . Smoking status: Former Smoker -- 0.50 packs/day for 20 years    Types: Cigarettes    Quit date: 05/30/1978  . Smokeless tobacco: Never Used  . Alcohol Use: No  . Drug Use: No  . Sexual Activity: Not on file   Other Topics Concern  . Not on file   Social History Narrative   Regular exercise: yes   Caffeine use: very little   Current Outpatient Prescriptions on File Prior to Visit  Medication Sig Dispense Refill  . amLODipine (NORVASC) 10 MG tablet Take 1 tablet (10 mg total) by mouth daily. 90 tablet 3  . aspirin 81 MG tablet Take 81 mg by mouth daily.     Marland Kitchen atorvastatin (LIPITOR) 40 MG tablet TAKE 1 BY MOUTH DAILY 90 tablet 3  . fexofenadine (ALLEGRA) 180 MG tablet Take 180 mg by mouth as needed.     Marland Kitchen glimepiride (AMARYL) 4 MG tablet Take 1 tablet (4 mg total) by mouth daily before breakfast. 90 tablet 0  . LANCETS ULTRA THIN 30G MISC by Does not apply route. One Touch    . metFORMIN (GLUCOPHAGE) 1000 MG tablet Take 1 tablet (1,000 mg total) by mouth 2 (two) times daily. 180 tablet 0  . mometasone (NASONEX) 50 MCG/ACT nasal spray Place 2 sprays into the nose daily. 51 g 3  . nystatin cream (MYCOSTATIN) Apply 1 application topically 2 (two) times daily as needed for dry skin.    . predniSONE (DELTASONE) 10 MG tablet Take 1 tablet (10 mg total) by mouth daily with breakfast. 30 tablet 3  . spironolactone (ALDACTONE) 25 MG tablet Take 1 tablet (25 mg total) by mouth daily. 90 tablet 3  . telmisartan (MICARDIS) 80 MG tablet Take 1 tablet (80 mg total) by mouth daily. 90 tablet 1  . [DISCONTINUED] nebivolol (BYSTOLIC) 5 MG tablet Take 1 tablet (5 mg total) by mouth 2 (two) times daily. 180 tablet 3   No current facility-administered medications on file prior to visit.   Allergies  Allergen Reactions  . Benicar Hct [Olmesartan Medoxomil-Hctz]     Painful and itchy knots on palms of hands  . Bystolic [Nebivolol Hcl]     headache  . Niaspan [Niacin Er] Other  (See Comments)    flushing  . Septra [Bactrim] Hives    Review of Systems- [ x ]  Complains of    [  ]  denies [  ] Recent weight change [  ]  Fatigue [  ] polydipsia [  ] polyuria [ x ]  nocturia [  ]  vision difficulty [  ]  chest pain [  ] shortness of breath [  ] leg swelling [  ] cough [  ] nausea/vomiting [  ] diarrhea [  ] constipation [  ] abdominal pain [  ]  tingling/numbness in extremities [  ]  concern with feet ( wounds/sores)   PE: BP 132/72 mmHg  Pulse 82  Resp 12  Ht 5\' 10"  (1.778 m)  Wt 243 lb 8 oz (110.451 kg)  BMI 34.94 kg/m2  SpO2 96% Wt Readings from Last 3 Encounters:  07/03/14 243 lb 8 oz (110.451 kg)  06/11/14 242 lb 8 oz (109.997 kg)  06/09/14 238 lb 12 oz (108.296 kg)   GENERAL: No acute distress, well developed HEENT:  Eye exam shows normal external appearance. Oral exam shows normal mucosa .  NECK:   Neck exam shows no lymphadenopathy. No Carotids bruits. Thyroid is not enlarged and no nodules felt.  no acanthosis nigricans LUNGS:         Chest is symmetrical. Lungs are clear to auscultation.Marland Kitchen   HEART:         Heart sounds:  S1 and S2 are normal. No murmurs or clicks heard. ABDOMEN:  No Distention present. Liver and spleen are not palpable. No other mass or tenderness present.  EXTREMITIES:     There is no edema. 2+ DP pulses  NEUROLOGICAL:     Grossly intact.            Diabetic foot exam done with shoes and socks removed: Normal Monofilament testing bilaterally. No deformities of toes.  Nails  Not dystrophic. Skin normal color. No open wounds. Dry skin.  MUSCULOSKELETAL:       There is no enlargement or gross deformity of the joints.  SKIN:       No rash  ASSESSMENT AND PLAN: Problem List Items Addressed This Visit      Cardiovascular and Mediastinum   Hypertension    Continue current regimen. BP at target. Update urine MA at next visit.          Endocrine   Diabetes mellitus, type 2 - Primary    Recent A1c not well controlled probably due  to recent decrease in physical activity and steroid tapers.  We discussed about continuing current medications but adding another agent to improve his sugar control. I am hesistant to increase his Glimeperide dose given his CAD history and his age/risk of hypoglycemia.  We discussed possibly adding SGLT-2 inhibitor versus DPPIV and he is going to check with his insurance and pharmacy regarding which one will be better covered. He will let us know which one he would like to try. Discussed common side effects and risk profile of each of these classes of medications.  Wants to avoid injectable therapy.   One touch mini meter given. Refill Test strips.  Check 1 x daily.  Discussed foot care, eye exams.  RTC 6 weeks         Other   Hyperlipemia    LDL at target on current therapy with statin.           - Return to clinic in 6 weeks with sugar log/meter.  Shawne Bulow [facility] 07/03/2014 2:31 PM

## 2014-07-03 NOTE — Progress Notes (Signed)
Pre visit review using our clinic review tool, if applicable. No additional management support is needed unless otherwise documented below in the visit note. 

## 2014-07-03 NOTE — Patient Instructions (Signed)
Check sugars atleast once daily ( please rotate times of the day when you are checking).  Record them in a log book and bring that/meter to next appointment.    Continue current metformin and glimeperide. Check with pharmacy and insurance which of these medications is better covered-  Invokana, Farxiga, Jardiance ( meds that will cause glucose loss through the urine)  Versus  Januvia, Onlgyza, Tradjenta ( that work on pancreas).  Let me know which of the above medications you would like to start.  Please come back for a follow-up appointment in 6 weeks

## 2014-07-03 NOTE — Assessment & Plan Note (Signed)
LDL at target on current therapy with statin.

## 2014-07-03 NOTE — Assessment & Plan Note (Signed)
Continue current regimen. BP at target. Update urine MA at next visit.

## 2014-07-03 NOTE — Assessment & Plan Note (Signed)
Recent A1c not well controlled probably due to recent decrease in physical activity and steroid tapers.  We discussed about continuing current medications but adding another agent to improve his sugar control. I am hesistant to increase his Glimeperide dose given his CAD history and his age/risk of hypoglycemia.  We discussed possibly adding SGLT-2 inhibitor versus DPPIV and he is going to check with his insurance and pharmacy regarding which one will be better covered. He will let us know which one he would like to try. Discussed common side effects and risk profile of each of these classes of medications.  Wants to avoid injectable therapy.   One touch mini meter given. Refill Test strips.  Check 1 x daily.  Discussed foot care, eye exams.  RTC 6 weeks

## 2014-07-17 ENCOUNTER — Other Ambulatory Visit (INDEPENDENT_AMBULATORY_CARE_PROVIDER_SITE_OTHER): Payer: Medicare HMO

## 2014-07-17 ENCOUNTER — Encounter: Payer: Self-pay | Admitting: Family Medicine

## 2014-07-17 DIAGNOSIS — Z1211 Encounter for screening for malignant neoplasm of colon: Secondary | ICD-10-CM

## 2014-07-17 LAB — FECAL OCCULT BLOOD, IMMUNOCHEMICAL: Fecal Occult Bld: POSITIVE — AB

## 2014-07-22 ENCOUNTER — Telehealth: Payer: Self-pay | Admitting: Family Medicine

## 2014-07-22 DIAGNOSIS — R195 Other fecal abnormalities: Secondary | ICD-10-CM | POA: Insufficient documentation

## 2014-07-22 NOTE — Telephone Encounter (Signed)
GI ref done for pos ifob card

## 2014-07-22 NOTE — Telephone Encounter (Signed)
-----   Message from [place], [place] sent at 07/21/2014  3:33 PM EST ----- Called and spoken to patient. Yes, patient verbalized that he is ok with the GI consult.

## 2014-07-30 LAB — HM COLONOSCOPY: HM COLON: ABNORMAL — AB

## 2014-08-05 ENCOUNTER — Other Ambulatory Visit: Payer: Self-pay

## 2014-08-05 MED ORDER — TELMISARTAN 80 MG PO TABS: 80.0000 mg | ORAL_TABLET | Freq: Every day | ORAL | Status: DC

## 2014-08-05 NOTE — Telephone Encounter (Signed)
Pt's wife left v/m requesting refill micardis to [facility] [address]. Advised done. Pt had annual 06/09/14.

## 2014-08-14 ENCOUNTER — Ambulatory Visit (INDEPENDENT_AMBULATORY_CARE_PROVIDER_SITE_OTHER): Payer: Medicare HMO | Admitting: Endocrinology

## 2014-08-14 ENCOUNTER — Encounter: Payer: Self-pay | Admitting: Endocrinology

## 2014-08-14 VITALS — BP 136/84 | HR 83 | Resp 16 | Ht 70.0 in | Wt 249.2 lb

## 2014-08-14 DIAGNOSIS — E785 Hyperlipidemia, unspecified: Secondary | ICD-10-CM

## 2014-08-14 DIAGNOSIS — I10 Essential (primary) hypertension: Secondary | ICD-10-CM

## 2014-08-14 DIAGNOSIS — E119 Type 2 diabetes mellitus without complications: Secondary | ICD-10-CM

## 2014-08-14 NOTE — Patient Instructions (Signed)
Check sugars atleast once daily ( please rotate times of the day when you are checking).  Record them in a log book and bring that/meter to next appointment.   Continue currrent metformin and glimeperide.   Please come back for a follow-up appointment in 1 month.

## 2014-08-14 NOTE — Assessment & Plan Note (Signed)
LDL at target on current therapy with statin.

## 2014-08-14 NOTE — Progress Notes (Signed)
Reason for visit-  Ryan Morgan is a 74 y.o.-year-old male,  for management of Type 2 diabetes, uncontrolled, without complications. Last visit with me Feb 2016 .   *no more steroid tapers since last visit.   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 Recalls being initially on lifestyle modifications.  Tried  Metformin, Glimeperide, Actos in the past. he has not been on insulin before- doesn't wish to try injectables.   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 Uses ? glucometer. By sugar log they are:  PREMEAL Breakfast Lunch Dinner Bedtime Overall  Glucose range: 126-163 136-160     Mean/median:        POST-MEAL PC Breakfast PC Lunch PC Dinner  Glucose range:     Mean/median:       Hypoglycemia-  No lows. Lowest sugar was n/a; he has hypoglycemia awareness at 70.   Dietary habits- eats three times daily. Tries to limit carbs, sweetened beverages, sodas, desserts. Snacks through the day. Very variable eating times.  Exercise- not in past 2 months, prior to that TD 2 x weekly and works outside  Weight - lost 40 lbs in past 1.5 years through lifestyle changes Wt Readings from Last 3 Encounters:  08/14/14 249 lb 4 oz (113.059 kg)  07/03/14 243 lb 8 oz (110.451 kg)  06/11/14 242 lb 8 oz (109.997 kg)    Diabetes Complications-  Nephropathy- No  CKD, last BUN/creatinine-  Lab Results  Component Value Date   BUN 16 06/02/2014   CREATININE 0.9 06/02/2014   Lab Results  Component Value Date   GFR 89.35 06/02/2014    No results found for: MICRALBCREAT    Retinopathy- No, Last DEE was in April 2015- has appt for this April Neuropathy- occ numbness and tingling in his feet after his surgery for Wegners. No known neuropathy.  Associated history - Has CAD s/p stent 2000 . No prior stroke. No hypothyroidism. his last TSH was  Lab Results  Component Value Date   TSH 0.87 06/02/2014    Hyperlipidemia-  his last set of lipids were- Currently on Lipitor 40 mg. Tolerating well.   Lab Results  Component Value Date   CHOL 134 06/02/2014   HDL 36.70* 06/02/2014   LDLCALC 68 06/02/2014   LDLDIRECT 65.7 08/13/2012   TRIG 148.0 06/02/2014   CHOLHDL 4 06/02/2014   Lab Results  Component Value Date   AST 15 06/02/2014   ALT 18 06/02/2014    Blood Pressure/HTN- Patient's blood pressure is well controlled today on current regimen that includes ARB ( micardis).  I have reviewed the patient's past medical history, medications and allergies.   Current Outpatient Prescriptions on File Prior to Visit  Medication Sig Dispense Refill  . amLODipine (NORVASC) 10 MG tablet Take 1 tablet (10 mg total) by mouth daily. 90 tablet 3  . aspirin 81 MG tablet Take 81 mg by mouth daily.     Marland Kitchen atorvastatin (LIPITOR) 40 MG tablet  TAKE 1 BY MOUTH DAILY 90 tablet 3  . fexofenadine (ALLEGRA) 180 MG tablet Take 180 mg by mouth as needed.     Marland Kitchen glimepiride (AMARYL) 4 MG tablet Take 1 tablet (4 mg total) by mouth daily before breakfast. 90 tablet 0  . glucose blood test strip Use as instructed, 1x a day - One Touch Glucose Strips. Dx code: 250.00 100 each 11  . LANCETS ULTRA THIN 30G MISC by Does not apply route. One Touch    . metFORMIN (GLUCOPHAGE) 1000 MG tablet Take 1 tablet (1,000 mg total) by mouth 2 (two) times daily. 180 tablet 0  . nystatin cream (MYCOSTATIN) Apply 1 application topically 2 (two) times daily as needed for dry skin.    . predniSONE (DELTASONE) 10 MG tablet Take 1 tablet (10 mg total) by mouth daily with  breakfast. 30 tablet 3  . spironolactone (ALDACTONE) 25 MG tablet Take 1 tablet (25 mg total) by mouth daily. 90 tablet 3  . telmisartan (MICARDIS) 80 MG tablet Take 1 tablet (80 mg total) by mouth daily. 90 tablet 1  . [DISCONTINUED] nebivolol (BYSTOLIC) 5 MG tablet Take 1 tablet (5 mg total) by mouth 2 (two) times daily. 180 tablet 3   No current facility-administered medications on file prior to visit.   Allergies  Allergen Reactions  . Benicar Hct [Olmesartan Medoxomil-Hctz]     Painful and itchy knots on palms of hands  . Bystolic [Nebivolol Hcl]     headache  . Niaspan [Niacin Er] Other (See Comments)    flushing  . Septra [Bactrim] Hives    Review of Systems- [ x ]  Complains of    [  ]  denies [  ] Recent weight change [  ]  Fatigue [  ] polydipsia [  ] polyuria [  ]  nocturia [  ]  vision difficulty [  ] chest pain [  ] shortness of breath [  ] leg swelling [  ] cough [  ] nausea/vomiting [  ] diarrhea [  ] constipation [  ] abdominal pain [  ]  tingling/numbness in extremities [  ]  concern with feet ( wounds/sores)   PE: BP 136/84 mmHg  Pulse 83  Resp 16  Ht 5\' 10"  (1.778 m)  Wt 249 lb 4 oz (113.059 kg)  BMI 35.76 kg/m2  SpO2 96% Wt Readings from Last 3 Encounters:  08/14/14 249 lb 4 oz (113.059 kg)  07/03/14 243 lb 8 oz (110.451 kg)  06/11/14 242 lb 8 oz (109.997 kg)   Exam: deferred  ASSESSMENT AND PLAN: Problem List Items Addressed This Visit      Cardiovascular and Mediastinum   Hypertension    Continue current regimen. BP at target. Update urine MA at next visit. Unable to provide sample this time.            Endocrine   Diabetes mellitus, type 2 - Primary    Recent A1c not well controlled probably due to recent decrease in physical activity and steroid tapers. He was encouraged to work on controlling portion sizes and increasing activity.  Recent numbers on the log are not far from target. Asked him to check at later part of the day, so that we  can get an idea of how they are. If elevated- then could consider dose increase in SU as the other options are too expensive.  He is not interested in starting any insulin therapy if his A1c is above target.  Continue current medications with metformin and glimeperide for now.    Labs including A1c at next visit.           Other   Hyperlipemia    LDL at target on current therapy with statin.             - Return to clinic in 4 weeks with sugar log/meter.  Camerin Jimenez [facility] 08/14/2014 3:58 PM

## 2014-08-14 NOTE — Assessment & Plan Note (Addendum)
Continue current regimen. BP at target. Update urine MA at next visit. Unable to provide sample this time.

## 2014-08-14 NOTE — Progress Notes (Signed)
Pre visit review using our clinic review tool, if applicable. No additional management support is needed unless otherwise documented below in the visit note. 

## 2014-08-14 NOTE — Assessment & Plan Note (Signed)
Recent A1c not well controlled probably due to recent decrease in physical activity and steroid tapers. He was encouraged to work on controlling portion sizes and increasing activity.  Recent numbers on the log are not far from target. Asked him to check at later part of the day, so that we can get an idea of how they are. If elevated- then could consider dose increase in SU as the other options are too expensive.  He is not interested in starting any insulin therapy if his A1c is above target.  Continue current medications with metformin and glimeperide for now.    Labs including A1c at next visit.

## 2014-09-04 NOTE — Telephone Encounter (Signed)
Pt going out of town and request refill micardis; advised pt's wife had requested refill micardis to [facility] [address] on 08/05/14 and advised pts wife done. Pt will ck with [facility].

## 2014-09-09 ENCOUNTER — Other Ambulatory Visit: Payer: Self-pay | Admitting: Family Medicine

## 2014-09-11 LAB — HM COLONOSCOPY

## 2014-09-16 ENCOUNTER — Ambulatory Visit (INDEPENDENT_AMBULATORY_CARE_PROVIDER_SITE_OTHER): Payer: Medicare HMO | Admitting: Endocrinology

## 2014-09-16 ENCOUNTER — Encounter: Payer: Self-pay | Admitting: Endocrinology

## 2014-09-16 VITALS — BP 122/84 | HR 80 | Resp 14 | Ht 70.0 in | Wt 246.5 lb

## 2014-09-16 DIAGNOSIS — E119 Type 2 diabetes mellitus without complications: Secondary | ICD-10-CM

## 2014-09-16 DIAGNOSIS — I10 Essential (primary) hypertension: Secondary | ICD-10-CM | POA: Diagnosis not present

## 2014-09-16 LAB — HEMOGLOBIN A1C: HEMOGLOBIN A1C: 6.7 % — AB (ref 4.6–6.5)

## 2014-09-16 LAB — MICROALBUMIN / CREATININE URINE RATIO
Creatinine,U: 227.3 mg/dL
Microalb Creat Ratio: 1 mg/g (ref 0.0–30.0)
Microalb, Ur: 2.2 mg/dL — ABNORMAL HIGH (ref 0.0–1.9)

## 2014-09-16 NOTE — Patient Instructions (Signed)
Good job with weight loss and checking sugars.   Continue current metformin and glimeperide.   Please come back for a follow-up appointment in 3 months Labs today

## 2014-09-16 NOTE — Progress Notes (Signed)
Pre visit review using our clinic review tool, if applicable. No additional management support is needed unless otherwise documented below in the visit note. 

## 2014-09-16 NOTE — Progress Notes (Signed)
Reason for visit-  Ryan Morgan is a 74 y.o.-year-old male,  for management of Type 2 diabetes, uncontrolled, without complications. Last visit with me March 2016 .   *no more steroid tapers since last visit.   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 Recalls being initially on lifestyle modifications.  Tried  Metformin, Glimeperide, Actos in the past. he has not been on insulin before- doesn't wish to try injectables.   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 Uses One Touch mini glucometer. By sugar log they are:  PREMEAL Breakfast Lunch Dinner Bedtime Overall  Glucose range:     99-170s  Mean/median:        POST-MEAL PC Breakfast PC Lunch PC Dinner  Glucose range:     Mean/median:       Hypoglycemia-  One recent low-68, after skipping meals. Lowest sugar was 68; he has hypoglycemia awareness at 70.   Dietary habits- eats three times daily. Tries to limit carbs, sweetened beverages, sodas, desserts. Doing well with diet recently.  Exercise- Prior to that TD 2 x weekly and works outside >>now started to work outside [employer] - lost 40 lbs in past 1.5 years through lifestyle changes Wt Readings from Last 3 Encounters:  09/16/14 246 lb 8 oz (111.812 kg)  08/14/14 249 lb 4 oz (113.059 kg)  07/03/14 243 lb 8 oz (110.451 kg)    Diabetes Complications-  Nephropathy- No  CKD, last BUN/creatinine-  Lab Results  Component Value Date   BUN 16 06/02/2014   CREATININE 0.9 06/02/2014   Lab Results  Component Value Date    GFR 89.35 06/02/2014   Lab Results  Component Value Date   MICRALBCREAT 1.0 09/16/2014      Retinopathy- No, Last DEE was in April 2015- has appt for this April Neuropathy- occ numbness and tingling in his feet after his surgery for Wegners. No known neuropathy.  Associated history - Has CAD s/p stent 2000 . No prior stroke. No hypothyroidism. his last TSH was  Lab Results  Component Value Date   TSH 0.87 06/02/2014    Hyperlipidemia-  his last set of lipids were- Currently on Lipitor 40 mg. Tolerating well.   Lab Results  Component Value Date   CHOL 134 06/02/2014   HDL 36.70* 06/02/2014   LDLCALC 68 06/02/2014   LDLDIRECT 65.7 08/13/2012   TRIG 148.0 06/02/2014   CHOLHDL 4 06/02/2014   Lab Results  Component Value Date   AST 15 06/02/2014   ALT 18 06/02/2014    Blood Pressure/HTN- Patient's blood pressure is well controlled today on current regimen that includes ARB ( micardis).  I have reviewed the patient's past medical history, medications and allergies.   Current Outpatient Prescriptions on File Prior to Visit  Medication Sig Dispense Refill  . amLODipine (NORVASC) 10 MG tablet Take 1 tablet (10 mg total) by mouth daily. 90 tablet 3  . aspirin 81 MG tablet Take 81 mg by mouth daily.     Marland Kitchen  atorvastatin (LIPITOR) 40 MG tablet TAKE 1 BY MOUTH DAILY 90 tablet 3  . fexofenadine (ALLEGRA) 180 MG tablet Take 180 mg by mouth as needed.     Marland Kitchen glimepiride (AMARYL) 4 MG tablet TAKE 1 TABLET (4 MG TOTAL) BY MOUTH DAILY BEFORE BREAKFAST. 30 tablet 0  . glucose blood test strip Use as instructed, 1x a day - One Touch Glucose Strips. Dx code: 250.00 100 each 11  . LANCETS ULTRA THIN 30G MISC by Does not apply route. One Touch    . metFORMIN (GLUCOPHAGE) 1000 MG tablet TAKE 1 TABLET (1,000 MG TOTAL) BY MOUTH 2 (TWO) TIMES DAILY. 60 tablet 0  . nystatin cream (MYCOSTATIN) Apply 1 application topically 2 (two) times daily as needed for dry skin.    . predniSONE (DELTASONE) 10 MG  tablet Take 1 tablet (10 mg total) by mouth daily with breakfast. 30 tablet 3  . spironolactone (ALDACTONE) 25 MG tablet Take 1 tablet (25 mg total) by mouth daily. 90 tablet 3  . telmisartan (MICARDIS) 80 MG tablet Take 1 tablet (80 mg total) by mouth daily. 90 tablet 1  . Triamcinolone Acetonide (NASACORT ALLERGY 24HR NA) Place 2 sprays into the nose daily.    . [DISCONTINUED] nebivolol (BYSTOLIC) 5 MG tablet Take 1 tablet (5 mg total) by mouth 2 (two) times daily. 180 tablet 3   No current facility-administered medications on file prior to visit.   Allergies  Allergen Reactions  . Benicar Hct [Olmesartan Medoxomil-Hctz]     Painful and itchy knots on palms of hands  . Bystolic [Nebivolol Hcl]     headache  . Niaspan [Niacin Er] Other (See Comments)    flushing  . Septra [Bactrim] Hives    Review of Systems- [ x ]  Complains of    [  ]  denies [  ] Recent weight change [  ]  Fatigue [  ] polydipsia [  ] polyuria [  ]  nocturia [  ]  vision difficulty [  ] chest pain [  ] shortness of breath [  ] leg swelling [  ] cough [  ] nausea/vomiting [  ] diarrhea [  ] constipation [  ] abdominal pain [  ]  tingling/numbness in extremities [  ]  concern with feet ( wounds/sores)   PE: BP 122/84 mmHg  Pulse 80  Resp 14  Ht 5\' 10"  (1.778 m)  Wt 246 lb 8 oz (111.812 kg)  BMI 35.37 kg/m2  SpO2 96% Wt Readings from Last 3 Encounters:  09/16/14 246 lb 8 oz (111.812 kg)  08/14/14 249 lb 4 oz (113.059 kg)  07/03/14 243 lb 8 oz (110.451 kg)   Exam: deferred  ASSESSMENT AND PLAN: Problem List Items Addressed This Visit      Cardiovascular and Mediastinum   Hypertension    Continue current regimen. BP at target. Update urine MA at this visit.            Relevant Orders   Hemoglobin A1c (Completed)   Microalbumin / creatinine urine ratio (Completed)     Endocrine   Diabetes mellitus, type 2 - Primary    Recent A1c not well controlled probably due to recent decrease in physical  activity and steroid tapers. He has done well with his lifestyle efforts and sugars are looking better. Update A1c today.   Asked him to continue checking 1 x daily at varying times of the day.   Continue current medications with metformin and glimeperide  for now.             Relevant Orders   Hemoglobin A1c (Completed)   Microalbumin / creatinine urine ratio (Completed)       - Return to clinic in 3 months with sugar log/meter.  Ryan Morgan [facility] 09/17/2014 8:56 AM

## 2014-09-17 NOTE — Assessment & Plan Note (Signed)
Continue current regimen. BP at target. Update urine MA at this visit.

## 2014-09-17 NOTE — Assessment & Plan Note (Signed)
Recent A1c not well controlled probably due to recent decrease in physical activity and steroid tapers. He has done well with his lifestyle efforts and sugars are looking better. Update A1c today.   Asked him to continue checking 1 x daily at varying times of the day.   Continue current medications with metformin and glimeperide for now.

## 2014-10-04 ENCOUNTER — Other Ambulatory Visit: Payer: Self-pay | Admitting: Family Medicine

## 2014-11-03 ENCOUNTER — Other Ambulatory Visit: Payer: Self-pay | Admitting: Family Medicine

## 2014-11-03 NOTE — Telephone Encounter (Signed)
Rout to Dr. Howell Rucks since she is managing his DM now

## 2014-11-04 ENCOUNTER — Encounter: Payer: Self-pay | Admitting: Family Medicine

## 2014-11-04 ENCOUNTER — Ambulatory Visit (INDEPENDENT_AMBULATORY_CARE_PROVIDER_SITE_OTHER): Payer: Medicare HMO | Admitting: Family Medicine

## 2014-11-04 VITALS — BP 124/72 | HR 93 | Temp 98.3°F | Ht 70.0 in | Wt 246.5 lb

## 2014-11-04 DIAGNOSIS — L84 Corns and callosities: Secondary | ICD-10-CM

## 2014-11-04 MED ORDER — TELMISARTAN 80 MG PO TABS: 80.0000 mg | ORAL_TABLET | Freq: Every day | ORAL | Status: DC

## 2014-11-04 MED ORDER — CLOBETASOL PROPIONATE 0.05 % EX CREA: 1.0000 "application " | TOPICAL_CREAM | Freq: Two times a day (BID) | CUTANEOUS | Status: DC

## 2014-11-04 NOTE — Patient Instructions (Signed)
Put cream on foot 2 times a day for 5 days, then use a PUMICE stone on the area until smooth.

## 2014-11-04 NOTE — Progress Notes (Signed)
Pre visit review using our clinic review tool, if applicable. No additional management support is needed unless otherwise documented below in the visit note. 

## 2014-11-04 NOTE — Progress Notes (Signed)
Dr. Frederico Hamman T. Amada Hallisey, MD, [facility] [place], [address] Phone: [phone] Fax: [phone]  11/04/2014  Patient: Ryan Morgan, MRN: [id number], DOB: [birth year], 74 y.o.  Primary Physician:  Loura Pardon, MD  Chief Complaint: Knot on bottom of foot  Subjective:   Ryan Morgan is a 74 y.o. very pleasant male patient who presents with the following:  L foot has had a slightly painful knot on the bottom. He is unable to see this. It is gotten a little bit better in the last day or 2.  Past Medical History, Surgical History, Social History, Family History, Problem List, Medications, and Allergies have been reviewed and updated if relevant.  GEN: No fevers, chills. Nontoxic. Primarily MSK c/o today. MSK: Detailed in the HPI GI: tolerating PO intake without difficulty Neuro: No numbness, parasthesias, or tingling associated. Otherwise the pertinent positives of the ROS are noted above.   Objective:   BP 124/72 mmHg  Pulse 93  Temp(Src) 98.3 F (36.8 C) (Oral)  Ht 5\' 10"  (1.778 m)  Wt 246 lb 8 oz (111.812 kg)  BMI 35.37 kg/m2   GEN: WDWN, NAD, Non-toxic, Alert & Oriented x 3 HEENT: Atraumatic, Normocephalic.  Ears and Nose: No external deformity. EXTR: No clubbing/cyanosis/edema NEURO: Normal gait.  PSYCH: Normally interactive. Conversant. Not depressed or anxious appearing.  Calm demeanor.    On the bottom of the foot there is one place laterally it is somewhat tender to palpation. There does not appear to be any kind of trauma or injury in the area.  Radiology: No results found.  Assessment and Plan:   Corn or callus  Exact etiology is not entirely clear. May be a corn. Could also be an early plantar wart. For now, we will treat with some corticosteroid-induced and then a pumice stone.  Follow-up: No Follow-up on file.  New Prescriptions   CLOBETASOL CREAM (TEMOVATE) 0.05 %    Apply 1 application  topically 2 (two) times daily.   No orders of the defined types were placed in this encounter.    Signed,  Maud Deed. Jenella Craigie, MD   Patient's Medications  New Prescriptions   CLOBETASOL CREAM (TEMOVATE) 0.05 %    Apply 1 application topically 2 (two) times daily.  Previous Medications   AMLODIPINE (NORVASC) 10 MG TABLET    Take 1 tablet (10 mg total) by mouth daily.   ASPIRIN 81 MG TABLET    Take 81 mg by mouth daily.    ATORVASTATIN (LIPITOR) 40 MG TABLET    TAKE 1 BY MOUTH DAILY   CHOLECALCIFEROL (VITAMIN D) 1000 UNITS TABLET    Take 1,000 Units by mouth daily.   FEXOFENADINE (ALLEGRA) 180 MG TABLET    Take 180 mg by mouth as needed.    GLIMEPIRIDE (AMARYL) 4 MG TABLET    TAKE 1 TABLET (4 MG TOTAL) BY MOUTH DAILY BEFORE BREAKFAST.   GLUCOSE BLOOD TEST STRIP    Use as instructed, 1x a day - One Touch Glucose Strips. Dx code: 250.00   LANCETS ULTRA THIN 30G MISC    by Does not apply route. One Touch   METFORMIN (GLUCOPHAGE) 1000 MG TABLET    TAKE 1 TABLET (1,000 MG TOTAL) BY MOUTH 2 (TWO) TIMES DAILY.   NYSTATIN CREAM (MYCOSTATIN)    Apply 1 application topically 2 (two) times daily as needed for dry skin.   PREDNISONE (DELTASONE) 10 MG TABLET    Take 1  tablet (10 mg total) by mouth daily with breakfast.   SPIRONOLACTONE (ALDACTONE) 25 MG TABLET    Take 1 tablet (25 mg total) by mouth daily.   TRIAMCINOLONE ACETONIDE (NASACORT ALLERGY 24HR NA)    Place 2 sprays into the nose daily.  Modified Medications   Modified Medication Previous Medication   TELMISARTAN (MICARDIS) 80 MG TABLET telmisartan (MICARDIS) 80 MG tablet      Take 1 tablet (80 mg total) by mouth daily.    Take 1 tablet (80 mg total) by mouth daily.  Discontinued Medications   No medications on file

## 2014-12-16 ENCOUNTER — Ambulatory Visit: Payer: Medicare HMO | Admitting: Endocrinology

## 2015-01-26 ENCOUNTER — Other Ambulatory Visit: Payer: Self-pay | Admitting: *Deleted

## 2015-01-26 MED ORDER — GLIMEPIRIDE 4 MG PO TABS: ORAL_TABLET | ORAL | Status: DC

## 2015-01-26 NOTE — Telephone Encounter (Signed)
Amy at [facility] called requesting a refill on Glimepiride 90 day supply. Amy stated that this was written by Dr. Sonny Masters and since she is no longer in [employer] wants to know if you will take care of this refill? See allergy/contrainidcation Please advise

## 2015-01-26 NOTE — Telephone Encounter (Signed)
Please refill times 3  Sounds like he needs ref to new endocrinologist - please get ok and I will do that

## 2015-01-26 NOTE — Telephone Encounter (Signed)
Med refilled, pt said he thinks he has a new endocrinologist he wants to see he just has to get in touch with them (not in [place]) pt said if he needs a referral to them he will call us back but if not he will schedule an appt with them as soon as he can

## 2015-03-10 ENCOUNTER — Encounter: Payer: Self-pay | Admitting: Family Medicine

## 2015-03-10 ENCOUNTER — Ambulatory Visit (INDEPENDENT_AMBULATORY_CARE_PROVIDER_SITE_OTHER): Payer: Medicare HMO | Admitting: Family Medicine

## 2015-03-10 VITALS — BP 116/64 | HR 76 | Temp 98.2°F | Ht 70.0 in | Wt 248.5 lb

## 2015-03-10 DIAGNOSIS — R21 Rash and other nonspecific skin eruption: Secondary | ICD-10-CM

## 2015-03-10 MED ORDER — CLOBETASOL PROPIONATE 0.05 % EX CREA: 1.0000 "application " | TOPICAL_CREAM | Freq: Two times a day (BID) | CUTANEOUS | Status: DC

## 2015-03-10 MED ORDER — HYDROXYZINE HCL 25 MG PO TABS: 25.0000 mg | ORAL_TABLET | Freq: Three times a day (TID) | ORAL | Status: DC | PRN

## 2015-03-10 NOTE — Progress Notes (Signed)
Pre visit review using our clinic review tool, if applicable. No additional management support is needed unless otherwise documented below in the visit note. 

## 2015-03-10 NOTE — Progress Notes (Signed)
Subjective:    Patient ID: Ryan Morgan, male    DOB: 05/25/1941, 74 y.o.   MRN: [id number]  HPI Here for skin spots on hands   This episode started 6 days ago ( coming and going for less than a month)  Has this back in 2014- went to a 24hour  clinic out of town  Given hydroxyzine and clobetasol  Never gave him a name for what he had   The spots start as red rings  Not fungal  Do not form blisters but they do crack open     Usually uses clobetasol and allegra   Has prednisone -only takes it when he needs it  Took 2 doses of 10 mg this week  Does not take it very often   Sees dermatologist (Dr Kellie Moor)   Patient Active Problem List   Diagnosis Date Noted  . Heme positive stool 07/22/2014  . Right ear pain 06/09/2014  . Colon cancer screening 06/09/2014  . Acute bronchitis 06/02/2014  . Blepharitis, bilateral 06/02/2014  . Shortness of breath 03/10/2014  . Atypical chest pain 03/10/2014  . Benign paroxysmal positional vertigo 12/14/2013  . Bursitis of right hip 10/17/2013  . Coronary atherosclerosis of native coronary artery 03/19/2013  . Encounter for Medicare annual wellness exam 03/05/2013  . 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 Osteopenia 03/05/2013  . Obesity 07/15/2011  . Prostate cancer screening 07/10/2011  . Headache on top of head 06/29/2011  . Cough 12/23/2010  . Hypertension 08/30/2010  . Hyperlipemia 08/30/2010  . Diabetes mellitus, type 2 ([place]) 08/30/2010  . 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 Diabetes mellitus   . Hypertension   . Heart disease   . Hyperlipidemia   . 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 Coronary artery disease    Past Surgical History  Procedure Laterality Date  . Basal cell carcinoma excision      removed from face x 3  . 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 Cardiac surgery  2000    stent in heart  .  Cardiac catheterization    . Coronary angioplasty  09/22/1998    s/p stent placement; Tristar 3.0 x 18 mm Ref [id number]   Social History  Substance Use Topics  . Smoking status: Former Smoker -- 0.50 packs/day for 20 years    Types: Cigarettes    Quit date: 05/30/1978  . Smokeless tobacco: Never Used  . Alcohol Use: No   Family History  Problem Relation Age of Onset  . Cancer Mother     tumor on tonsil  . Stroke Maternal Grandfather    Allergies  Allergen Reactions  . Benicar Hct [Olmesartan Medoxomil-Hctz]     Painful and itchy knots on palms of hands  . Bystolic [Nebivolol Hcl]     headache  . Niaspan [Niacin Er] Other (See Comments)    flushing  . Septra [Bactrim] Hives   Current Outpatient Prescriptions on File Prior to Visit  Medication Sig Dispense Refill  . amLODipine (NORVASC) 10 MG tablet Take 1 tablet (10 mg total) by mouth daily. 90 tablet 3  . aspirin 81 MG tablet Take 81 mg by mouth daily.     Marland Kitchen atorvastatin (LIPITOR) 40 MG tablet TAKE 1 BY MOUTH DAILY 90 tablet 3  . cholecalciferol (VITAMIN D) 1000 UNITS tablet Take 1,000 Units by mouth daily.    . clobetasol cream (  TEMOVATE) 8.88 % Apply 1 application topically 2 (two) times daily. 30 g 0  . fexofenadine (ALLEGRA) 180 MG tablet Take 180 mg by mouth as needed.     Marland Kitchen glimepiride (AMARYL) 4 MG tablet TAKE 1 TABLET (4 MG TOTAL) BY MOUTH DAILY BEFORE BREAKFAST. 90 tablet 0  . glucose blood test strip Use as instructed, 1x a day - One Touch Glucose Strips. Dx code: 250.00 100 each 11  . LANCETS ULTRA THIN 30G MISC by Does not apply route. One Touch    . metFORMIN (GLUCOPHAGE) 1000 MG tablet TAKE 1 TABLET (1,000 MG TOTAL) BY MOUTH 2 (TWO) TIMES DAILY. 180 tablet 1  . nystatin cream (MYCOSTATIN) Apply 1 application topically 2 (two) times daily as needed for dry skin.    . predniSONE (DELTASONE) 10 MG tablet Take 1 tablet (10 mg total) by mouth daily with breakfast. 30 tablet 3  . spironolactone (ALDACTONE) 25 MG tablet  Take 1 tablet (25 mg total) by mouth daily. 90 tablet 3  . telmisartan (MICARDIS) 80 MG tablet Take 1 tablet (80 mg total) by mouth daily. 90 tablet 3  . Triamcinolone Acetonide (NASACORT ALLERGY 24HR NA) Place 2 sprays into the nose daily.    . [DISCONTINUED] nebivolol (BYSTOLIC) 5 MG tablet Take 1 tablet (5 mg total) by mouth 2 (two) times daily. 180 tablet 3   No current facility-administered medications on file prior to visit.    Review of Systems Review of Systems  Constitutional: Negative for fever, appetite change, fatigue and unexpected weight change.  Eyes: Negative for pain and visual disturbance.  Respiratory: Negative for cough and shortness of breath.   Cardiovascular: Negative for cp or palpitations    Gastrointestinal: Negative for nausea, diarrhea and constipation.  Genitourinary: Negative for urgency and frequency.  Skin: Negative for pallor and pos for rash  on hands only Neurological: Negative for weakness, light-headedness, numbness and headaches.  Hematological: Negative for adenopathy. Does not bruise/bleed easily.  Psychiatric/Behavioral: Negative for dysphoric mood. The patient is not nervous/anxious.         Objective:   Physical Exam  Constitutional: He appears well-developed and well-nourished. No distress.  obese and well appearing   HENT:  Head: Normocephalic and atraumatic.  Mouth/Throat: Oropharynx is clear and moist.  Eyes: Conjunctivae and EOM are normal. Pupils are equal, round, and reactive to light. Right eye exhibits no discharge. Left eye exhibits no discharge.  Neck: Normal range of motion. Neck supple.  Cardiovascular: Normal rate and regular rhythm.   Pulmonary/Chest: Effort normal and breath sounds normal.  Musculoskeletal: He exhibits no edema.  Lymphadenopathy:    He has no cervical adenopathy.  Skin: Skin is warm and dry. Rash noted.  Several 1 cm oval areas of subtle erythema (not raised) on palms of hands -and sides of  fingers Largest area on R index/3rd fingers  No excoriations or vesicles   Psychiatric: He has a normal mood and affect.          Assessment & Plan:   Problem List Items Addressed This Visit      Musculoskeletal and Integument   Rash of hands - Primary    In pt with hx of Wegner's dz, intermittent erythematous areas approx 1cm on hands that occ crack and are intensely itchy Unsure of dx  Will refill clobetasol and atarax - ref to derm for further eval  inst to use gentle cleanser on hands        Relevant Orders   Ambulatory  referral to Dermatology

## 2015-03-10 NOTE — Patient Instructions (Signed)
Continue the clobetasol cream as needed and use hydroxyzine with caution for itching  Stop at check out for dermatology referral  Keep hands cool - that helps itching    If symptoms worsen in the meantime let me know

## 2015-03-11 DIAGNOSIS — L989 Disorder of the skin and subcutaneous tissue, unspecified: Secondary | ICD-10-CM | POA: Diagnosis not present

## 2015-03-11 DIAGNOSIS — D485 Neoplasm of uncertain behavior of skin: Secondary | ICD-10-CM | POA: Diagnosis not present

## 2015-03-12 NOTE — Assessment & Plan Note (Signed)
In pt with hx of Wegner's dz, intermittent erythematous areas approx 1cm on hands that occ crack and are intensely itchy Unsure of dx  Will refill clobetasol and atarax - ref to derm for further eval  inst to use gentle cleanser on hands

## 2015-03-27 DIAGNOSIS — R208 Other disturbances of skin sensation: Secondary | ICD-10-CM | POA: Diagnosis not present

## 2015-03-27 DIAGNOSIS — L570 Actinic keratosis: Secondary | ICD-10-CM | POA: Diagnosis not present

## 2015-03-27 DIAGNOSIS — L538 Other specified erythematous conditions: Secondary | ICD-10-CM | POA: Diagnosis not present

## 2015-03-27 DIAGNOSIS — L820 Inflamed seborrheic keratosis: Secondary | ICD-10-CM | POA: Diagnosis not present

## 2015-03-27 DIAGNOSIS — X32XXXA Exposure to sunlight, initial encounter: Secondary | ICD-10-CM | POA: Diagnosis not present

## 2015-05-15 ENCOUNTER — Other Ambulatory Visit: Payer: Self-pay | Admitting: Family Medicine

## 2015-05-15 NOTE — Telephone Encounter (Signed)
Will refill electronically  

## 2015-05-15 NOTE — Telephone Encounter (Signed)
V/m left requesting refill for hydroxyzine for itching in hands; pt is out of med and request refill to [facility]. Last seen and rx last refilled # 30 on 03/10/15.

## 2015-05-20 ENCOUNTER — Ambulatory Visit (INDEPENDENT_AMBULATORY_CARE_PROVIDER_SITE_OTHER): Payer: Medicare HMO

## 2015-05-20 DIAGNOSIS — Z23 Encounter for immunization: Secondary | ICD-10-CM

## 2015-06-17 ENCOUNTER — Ambulatory Visit (INDEPENDENT_AMBULATORY_CARE_PROVIDER_SITE_OTHER): Payer: Medicare HMO | Admitting: Cardiovascular Disease

## 2015-06-17 ENCOUNTER — Encounter: Payer: Self-pay | Admitting: Cardiovascular Disease

## 2015-06-17 VITALS — BP 130/64 | HR 75 | Ht 70.5 in | Wt 247.2 lb

## 2015-06-17 DIAGNOSIS — M3130 Wegener's granulomatosis without renal involvement: Secondary | ICD-10-CM | POA: Diagnosis not present

## 2015-06-17 DIAGNOSIS — R0789 Other chest pain: Secondary | ICD-10-CM

## 2015-06-17 DIAGNOSIS — E1159 Type 2 diabetes mellitus with other circulatory complications: Secondary | ICD-10-CM | POA: Diagnosis not present

## 2015-06-17 DIAGNOSIS — I2510 Atherosclerotic heart disease of native coronary artery without angina pectoris: Secondary | ICD-10-CM

## 2015-06-17 DIAGNOSIS — I10 Essential (primary) hypertension: Secondary | ICD-10-CM | POA: Diagnosis not present

## 2015-06-17 DIAGNOSIS — E785 Hyperlipidemia, unspecified: Secondary | ICD-10-CM

## 2015-06-17 DIAGNOSIS — E669 Obesity, unspecified: Secondary | ICD-10-CM

## 2015-06-17 NOTE — Assessment & Plan Note (Signed)
Currently with no symptoms of angina. No further workup at this time. Continue current medication regimen. 

## 2015-06-17 NOTE — Assessment & Plan Note (Signed)
He reports that he takes prednisone periodically when he has shortness of breath He is requesting a Sports administrator. He is looking to [facility] for specialist Reports he is running out of prednisone

## 2015-06-17 NOTE — Assessment & Plan Note (Signed)
Hemoglobin A1c relatively well-controlled We have encouraged continued exercise, careful diet management in an effort to lose weight. 

## 2015-06-17 NOTE — Assessment & Plan Note (Signed)
Cholesterol is at goal on the current lipid regimen. No changes to the medications were made.  

## 2015-06-17 NOTE — Assessment & Plan Note (Signed)
He is having chest pain in the mornings at rest, none with exertion. Atypical in nature. No further testing at this time. We have suggested if he has any symptoms on exertion that he call us were stress testing

## 2015-06-17 NOTE — Progress Notes (Signed)
Patient ID: Ryan Morgan, male    DOB: 23-May-1941, 75 y.o.   MRN: [id number]  HPI Comments: Ryan Morgan is a very pleasant 75 year old gentleman with a history of coronary artery disease, stent placed to his LAD in 2000, repeat catheterization in January 2003 with 40% LAD, 50% diagonal disease, normal ejection fraction who presents for follow-up of his coronary artery disease.  He has a long history of Wegener's granulomatosis, diabetes. Prior thoracotomy surgery on the left for lung nodule, chronic pain on the left that is episodic, stable over several years He is on chronic prednisone for Wegener's  Last seen in clinic approximately 2 years ago Since that time he reports that he has been doing relatively well. Wakes up in the morning sometimes with diffuse chest abdominal pain, seems to resolve with movement Otherwise very active at baseline, reports he goes camping, down to the beach frequently, able to do hours of activities. Sometimes feels very tired when he is done. Denies having any anginal symptoms as he did years ago  Lab work reviewed with him hemoglobin A1c 6.7, total cholesterol 131, LDL 68  EKG on today's visit shows normal sinus rhythm with rate 75 bpm, old inferior MI  Other past medical history He reports having a stress test in 2009 in [place] reports was normal. He is active, does some exercise but not irregular exercise program. He's been trying to work on his weight. This continues to be a struggle. Hemoglobin A1c 7.1 by his report. He is tolerating his other medications well. Blood pressures typically well controlled, more elevated today.  Stress test from January 2009 was a treadmill study, ejection fraction estimated at 45%, with medium-sized area of moderate to severe hypoperfusion involving the septal region.  Allergies  Allergen Reactions  . Benicar Hct [Olmesartan Medoxomil-Hctz]     Painful and itchy knots on palms of hands  . Bystolic [Nebivolol Hcl]    headache  . Niaspan [Niacin Er] Other (See Comments)    flushing  . Septra [Bactrim] Hives    Outpatient Encounter Prescriptions as of 06/17/2015  Medication Sig  . amLODipine (NORVASC) 10 MG tablet Take 1 tablet (10 mg total) by mouth daily.  Marland Kitchen aspirin 81 MG tablet Take 81 mg by mouth daily.   Marland Kitchen atorvastatin (LIPITOR) 40 MG tablet TAKE 1 BY MOUTH DAILY  . cholecalciferol (VITAMIN D) 1000 UNITS tablet Take 1,000 Units by mouth daily.  . clobetasol cream (TEMOVATE) AB-123456789 % Apply 1 application topically 2 (two) times daily. To affected areas  . fexofenadine (ALLEGRA) 180 MG tablet Take 180 mg by mouth as needed.   Marland Kitchen glimepiride (AMARYL) 4 MG tablet TAKE 1 TABLET (4 MG TOTAL) BY MOUTH DAILY BEFORE BREAKFAST.  Marland Kitchen glucose blood test strip Use as instructed, 1x a day - One Touch Glucose Strips. Dx code: 250.00  . hydrOXYzine (ATARAX/VISTARIL) 25 MG tablet TAKE ONE TABLET BY MOUTH THREE TIMES DAILY AS NEEDED FOR ITCHING (CAUTION OF SEDATION)  . LANCETS ULTRA THIN 30G MISC by Does not apply route. One Touch  . metFORMIN (GLUCOPHAGE) 1000 MG tablet TAKE 1 TABLET (1,000 MG TOTAL) BY MOUTH 2 (TWO) TIMES DAILY.  Marland Kitchen nystatin cream (MYCOSTATIN) Apply 1 application topically 2 (two) times daily as needed for dry skin.  . predniSONE (DELTASONE) 10 MG tablet Take 1 tablet (10 mg total) by mouth daily with breakfast.  . telmisartan (MICARDIS) 80 MG tablet Take 1 tablet (80 mg total) by mouth daily.  . Triamcinolone Acetonide (NASACORT ALLERGY  24HR NA) Place 2 sprays into the nose daily.  Marland Kitchen spironolactone (ALDACTONE) 25 MG tablet Take 1 tablet (25 mg total) by mouth daily.   No facility-administered encounter medications on file as of 06/17/2015.    Past Medical History  Diagnosis Date  . Diabetes mellitus   . Hypertension   . Heart disease   . Hyperlipidemia   . 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 Coronary artery disease     Past Surgical History  Procedure Laterality Date  . Basal cell carcinoma  excision      removed from face x 3  . 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 Cardiac surgery  2000    stent in heart  . Cardiac catheterization    . Coronary angioplasty  09/22/1998    s/p stent placement; Tristar 3.0 x 18 mm Ref [id number]    Social History  reports that he quit smoking about 37 years ago. His smoking use included Cigarettes. He has a 10 pack-year smoking history. He has never used smokeless tobacco. He reports that he does not drink alcohol or use illicit drugs.  Family History family history includes Cancer in his mother; Stroke in his maternal grandfather.   Review of Systems  Constitutional: Negative.   Respiratory: Negative.   Cardiovascular: Negative.   Gastrointestinal: Negative.   Endocrine: Negative.   Musculoskeletal: Negative.   Neurological: Negative.   Hematological: Negative.   Psychiatric/Behavioral: Negative.   All other systems reviewed and are negative.   BP 130/64 mmHg  Pulse 75  Ht 5' 10.5" (1.791 m)  Wt 247 lb 4 oz (112.152 kg)  BMI 34.96 kg/m2  Physical Exam  Constitutional: He is oriented to person, place, and time. He appears well-developed and well-nourished.  HENT:  Head: Normocephalic.  Nose: Nose normal.  Mouth/Throat: Oropharynx is clear and moist.  Eyes: Conjunctivae are normal. Pupils are equal, round, and reactive to light.  Neck: Normal range of motion. Neck supple. No JVD present.  Cardiovascular: Normal rate, regular rhythm, S1 normal, S2 normal, normal heart sounds and intact distal pulses.  Exam reveals no gallop and no friction rub.   No murmur heard. Pulmonary/Chest: Effort normal and breath sounds normal. No respiratory distress. He has no wheezes. He has no rales. He exhibits no tenderness.  Abdominal: Soft. Bowel sounds are normal. He exhibits no distension. There is no tenderness.  Musculoskeletal: Normal range of motion. He exhibits no edema or tenderness.  Lymphadenopathy:    He  has no cervical adenopathy.  Neurological: He is alert and oriented to person, place, and time. Coordination normal.  Skin: Skin is warm and dry. No rash noted. No erythema.  Psychiatric: He has a normal mood and affect. His behavior is normal. Judgment and thought content normal.      Assessment and Plan   Nursing note and vitals reviewed.

## 2015-06-17 NOTE — Assessment & Plan Note (Signed)
Blood pressure is well controlled on today's visit. No changes made to the medications. 

## 2015-06-17 NOTE — Patient Instructions (Signed)
You are doing well. No medication changes were made.  Please call us if you have new issues that need to be addressed before your next appt.  Your physician wants you to follow-up in: 6 months.  You will receive a reminder letter in the mail two months in advance. If you don't receive a letter, please call our office to schedule the follow-up appointment.   

## 2015-06-17 NOTE — Assessment & Plan Note (Signed)
Recommended a strict diet, exercise program for weight loss

## 2015-07-23 ENCOUNTER — Other Ambulatory Visit: Payer: Self-pay | Admitting: Family Medicine

## 2015-07-30 DIAGNOSIS — L905 Scar conditions and fibrosis of skin: Secondary | ICD-10-CM | POA: Diagnosis not present

## 2015-07-30 DIAGNOSIS — Z85828 Personal history of other malignant neoplasm of skin: Secondary | ICD-10-CM | POA: Diagnosis not present

## 2015-07-30 DIAGNOSIS — D485 Neoplasm of uncertain behavior of skin: Secondary | ICD-10-CM | POA: Diagnosis not present

## 2015-07-30 DIAGNOSIS — L570 Actinic keratosis: Secondary | ICD-10-CM | POA: Diagnosis not present

## 2015-07-30 DIAGNOSIS — X32XXXA Exposure to sunlight, initial encounter: Secondary | ICD-10-CM | POA: Diagnosis not present

## 2015-07-30 DIAGNOSIS — D045 Carcinoma in situ of skin of trunk: Secondary | ICD-10-CM | POA: Diagnosis not present

## 2015-08-18 DIAGNOSIS — L570 Actinic keratosis: Secondary | ICD-10-CM | POA: Diagnosis not present

## 2015-08-18 DIAGNOSIS — X32XXXA Exposure to sunlight, initial encounter: Secondary | ICD-10-CM | POA: Diagnosis not present

## 2015-08-23 ENCOUNTER — Encounter: Payer: Self-pay | Admitting: Family Medicine

## 2015-08-24 MED ORDER — TELMISARTAN 80 MG PO TABS: 80.0000 mg | ORAL_TABLET | Freq: Every day | ORAL | Status: DC

## 2015-08-24 MED ORDER — AMLODIPINE BESYLATE 10 MG PO TABS: 10.0000 mg | ORAL_TABLET | Freq: Every day | ORAL | Status: DC

## 2015-08-24 MED ORDER — GLIMEPIRIDE 4 MG PO TABS: ORAL_TABLET | ORAL | Status: DC

## 2015-08-24 MED ORDER — METFORMIN HCL 1000 MG PO TABS: ORAL_TABLET | ORAL | Status: DC

## 2015-08-24 NOTE — Telephone Encounter (Signed)
Pt called and does not need the atorvastatin or spironolactone now. Refill metformin,glimepiride,amlodipine and telmisartan x 1 each until pt comes for CPX on 10/13/15.Pt voiced understanding. Pt does not remember seeing Dr Howell Rucks and since Dr Howell Rucks is no longer here will ck with Dr Glori Bickers at [facility] to see if she will follow diabetes or will pt have to see endo.

## 2015-09-03 DIAGNOSIS — D044 Carcinoma in situ of skin of scalp and neck: Secondary | ICD-10-CM | POA: Diagnosis not present

## 2015-09-22 DIAGNOSIS — X32XXXA Exposure to sunlight, initial encounter: Secondary | ICD-10-CM | POA: Diagnosis not present

## 2015-09-22 DIAGNOSIS — L570 Actinic keratosis: Secondary | ICD-10-CM | POA: Diagnosis not present

## 2015-10-03 ENCOUNTER — Telehealth: Payer: Self-pay | Admitting: Family Medicine

## 2015-10-03 DIAGNOSIS — E1159 Type 2 diabetes mellitus with other circulatory complications: Secondary | ICD-10-CM

## 2015-10-03 DIAGNOSIS — E785 Hyperlipidemia, unspecified: Secondary | ICD-10-CM

## 2015-10-03 DIAGNOSIS — I10 Essential (primary) hypertension: Secondary | ICD-10-CM

## 2015-10-03 DIAGNOSIS — Z125 Encounter for screening for malignant neoplasm of prostate: Secondary | ICD-10-CM

## 2015-10-03 NOTE — Telephone Encounter (Signed)
-----   Message from Terri J Walsh sent at 10/01/2015  2:57 PM EDT ----- Regarding: Lab orders for Wednesday, 5.10.17 Patient is scheduled for CPX labs, please order future labs, Thanks , Terri  

## 2015-10-07 ENCOUNTER — Other Ambulatory Visit: Payer: Medicare HMO

## 2015-10-09 ENCOUNTER — Other Ambulatory Visit (INDEPENDENT_AMBULATORY_CARE_PROVIDER_SITE_OTHER): Payer: Medicare HMO

## 2015-10-09 ENCOUNTER — Ambulatory Visit (INDEPENDENT_AMBULATORY_CARE_PROVIDER_SITE_OTHER): Payer: Medicare HMO

## 2015-10-09 VITALS — BP 118/80 | HR 76 | Temp 97.7°F | Ht 70.0 in | Wt 244.8 lb

## 2015-10-09 DIAGNOSIS — Z0000 Encounter for general adult medical examination without abnormal findings: Secondary | ICD-10-CM

## 2015-10-09 DIAGNOSIS — I10 Essential (primary) hypertension: Secondary | ICD-10-CM

## 2015-10-09 DIAGNOSIS — E785 Hyperlipidemia, unspecified: Secondary | ICD-10-CM | POA: Diagnosis not present

## 2015-10-09 DIAGNOSIS — E1159 Type 2 diabetes mellitus with other circulatory complications: Secondary | ICD-10-CM | POA: Diagnosis not present

## 2015-10-09 DIAGNOSIS — Z125 Encounter for screening for malignant neoplasm of prostate: Secondary | ICD-10-CM | POA: Diagnosis not present

## 2015-10-09 LAB — COMPREHENSIVE METABOLIC PANEL
ALBUMIN: 4.2 g/dL (ref 3.5–5.2)
ALT: 24 U/L (ref 0–53)
AST: 18 U/L (ref 0–37)
Alkaline Phosphatase: 63 U/L (ref 39–117)
BILIRUBIN TOTAL: 0.5 mg/dL (ref 0.2–1.2)
BUN: 15 mg/dL (ref 6–23)
CALCIUM: 9.3 mg/dL (ref 8.4–10.5)
CHLORIDE: 102 meq/L (ref 96–112)
CO2: 28 mEq/L (ref 19–32)
CREATININE: 0.91 mg/dL (ref 0.40–1.50)
GFR: 86.76 mL/min (ref >60.00)
Glucose, Bld: 155 mg/dL — ABNORMAL HIGH (ref 70–99)
Potassium: 4.5 mEq/L (ref 3.5–5.1)
Sodium: 139 mEq/L (ref 135–145)
Total Protein: 6.4 g/dL (ref 6.0–8.3)

## 2015-10-09 LAB — PSA: PSA: 0.69 ng/mL (ref 0.10–4.00)

## 2015-10-09 LAB — CBC WITH DIFFERENTIAL/PLATELET
BASOS PCT: 0.4 % (ref 0.0–3.0)
Basophils Absolute: 0 10*3/uL (ref 0.0–0.1)
EOS ABS: 0.1 10*3/uL (ref 0.0–0.7)
EOS PCT: 1.4 % (ref 0.0–5.0)
HEMATOCRIT: 36.8 % — AB (ref 39.0–52.0)
HEMOGLOBIN: 12.6 g/dL — AB (ref 13.0–17.0)
LYMPHS PCT: 25.9 % (ref 12.0–46.0)
Lymphs Abs: 1 10*3/uL (ref 0.7–4.0)
MCHC: 34.2 g/dL (ref 30.0–36.0)
MCV: 91.3 fl (ref 78.0–100.0)
MONOS PCT: 7.9 % (ref 3.0–12.0)
Monocytes Absolute: 0.3 10*3/uL (ref 0.1–1.0)
NEUTROS ABS: 2.6 10*3/uL (ref 1.4–7.7)
Neutrophils Relative %: 64.4 % (ref 43.0–77.0)
PLATELETS: 191 10*3/uL (ref 150.0–400.0)
RBC: 4.03 Mil/uL — ABNORMAL LOW (ref 4.22–5.81)
RDW: 13.6 % (ref 11.5–15.5)
WBC: 4 10*3/uL (ref 4.0–10.5)

## 2015-10-09 LAB — LIPID PANEL
CHOL/HDL RATIO: 4
CHOLESTEROL: 130 mg/dL (ref 0–200)
HDL: 30.4 mg/dL — AB (ref >39.00)
NonHDL: 99.59
TRIGLYCERIDES: 211 mg/dL — AB (ref 0.0–149.0)
VLDL: 42.2 mg/dL — AB (ref 0.0–40.0)

## 2015-10-09 LAB — TSH: TSH: 1.56 u[IU]/mL (ref 0.35–4.50)

## 2015-10-09 LAB — HEMOGLOBIN A1C: HEMOGLOBIN A1C: 8.7 % — AB (ref 4.6–6.5)

## 2015-10-09 LAB — LDL CHOLESTEROL, DIRECT: Direct LDL: 69 mg/dL

## 2015-10-09 NOTE — Progress Notes (Signed)
PCP notes  Health maintenance: Eye exam has already been scheduled. Foot exam will be addressed at next visit with PCP. A1C was completed today with lab work.   Abnormal screenings:  Hearing - failed. Please assess at next appt on 10/13/15.

## 2015-10-09 NOTE — Progress Notes (Signed)
Subjective:   Ryan Morgan is a 75 y.o. male who presents for Medicare Annual/Subsequent preventive examination.  Review of Systems:  N/A Cardiac Risk Factors include: advanced age (>1men, >58 women);male gender;obesity (BMI >30kg/m2);diabetes mellitus;dyslipidemia;hypertension     Objective:    Vitals: BP 118/80 mmHg  Pulse 76  Temp(Src) 97.7 F (36.5 C) (Oral)  Ht 5\' 10"  (1.778 m)  Wt 244 lb 12 oz (111.018 kg)  BMI 35.12 kg/m2  SpO2 96%  Body mass index is 35.12 kg/(m^2).  Tobacco History  Smoking status  . Former Smoker -- 0.50 packs/day for 20 years  . Types: Cigarettes  . Quit date: 05/30/1978  Smokeless tobacco  . Never Used     Counseling given: No   Past Medical History  Diagnosis Date  . Diabetes mellitus   . Hypertension   . Heart disease   . Hyperlipidemia   . 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 Coronary artery disease    Past Surgical History  Procedure Laterality Date  . Basal cell carcinoma excision      removed from face x 3  . 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 Cardiac surgery  2000    stent in heart  . Cardiac catheterization    . Coronary angioplasty  09/22/1998    s/p stent placement; Tristar 3.0 x 18 mm Ref [id number]   Family History  Problem Relation Age of Onset  . Cancer Mother     tumor on tonsil  . Stroke Maternal Grandfather    History  Sexual Activity  . Sexual Activity: No    Outpatient Encounter Prescriptions as of 10/09/2015  Medication Sig  . amLODipine (NORVASC) 10 MG tablet Take 1 tablet (10 mg total) by mouth daily.  Marland Kitchen aspirin 81 MG tablet Take 81 mg by mouth daily.   Marland Kitchen atorvastatin (LIPITOR) 40 MG tablet TAKE 1 TABLET BY MOUTH DAILY  . cetirizine (ZYRTEC) 10 MG tablet Take 10 mg by mouth 2 (two) times daily.  . clobetasol cream (TEMOVATE) AB-123456789 % Apply 1 application topically 2 (two) times daily. To affected areas (Patient taking differently: Apply 1 application topically 2 (two)  times daily as needed. To affected areas)  . glimepiride (AMARYL) 4 MG tablet TAKE 1 TABLET (4 MG TOTAL) BY MOUTH DAILY BEFORE BREAKFAST.  Marland Kitchen glucose blood test strip Use as instructed, 1x a day - One Touch Glucose Strips. Dx code: 250.00  . LANCETS ULTRA THIN 30G MISC by Does not apply route. One Touch  . metFORMIN (GLUCOPHAGE) 1000 MG tablet TAKE 1 TABLET (1,000 MG TOTAL) BY MOUTH 2 (TWO) TIMES DAILY.  Marland Kitchen nystatin cream (MYCOSTATIN) Apply 1 application topically 2 (two) times daily as needed for dry skin.  . predniSONE (DELTASONE) 10 MG tablet Take 1 tablet (10 mg total) by mouth daily with breakfast.  . spironolactone (ALDACTONE) 25 MG tablet TAKE 1 TABLET BY MOUTH DAILY  . telmisartan (MICARDIS) 80 MG tablet Take 1 tablet (80 mg total) by mouth daily.  . Triamcinolone Acetonide (NASACORT ALLERGY 24HR NA) Place 2 sprays into the nose daily as needed.   . cholecalciferol (VITAMIN D) 1000 UNITS tablet Take 1,000 Units by mouth daily. Reported on 10/09/2015  . hydrOXYzine (ATARAX/VISTARIL) 25 MG tablet TAKE ONE TABLET BY MOUTH THREE TIMES DAILY AS NEEDED FOR ITCHING (CAUTION OF SEDATION) (Patient not taking: Reported on 10/09/2015)  . [DISCONTINUED] fexofenadine (ALLEGRA) 180 MG tablet Take 180 mg by mouth as needed.  No facility-administered encounter medications on file as of 10/09/2015.    Activities of Daily Living In your present state of health, do you have any difficulty performing the following activities: 10/09/2015  Hearing? Y  Vision? N  Difficulty concentrating or making decisions? N  Walking or climbing stairs? N  Dressing or bathing? N  Doing errands, shopping? N  Preparing Food and eating ? N  Using the Toilet? N  In the past six months, have you accidently leaked urine? Y  Do you have problems with loss of bowel control? Y  Managing your Medications? N  Managing your Finances? N  Housekeeping or managing your Housekeeping? N    Patient Care Team: Abner Greenspan, MD as  PCP - General (Family Medicine) Minna Merritts, MD as Consulting Physician (Cardiology) Oneta Rack, MD as Consulting Physician (Dermatology) Juanito Doom, MD as Consulting Physician (Pulmonary Disease)   Assessment:     Hearing Screening   125Hz  250Hz  500Hz  1000Hz  2000Hz  4000Hz  8000Hz   Right ear:   40 40 40 0   Left ear:   40 0 40 0   Vision Screening Comments: Last eye exam in 2016 @ [place] and Dietary recommendations Current Exercise Habits: Home exercise routine, Type of exercise: treadmill;walking, Time (Minutes): 30, Frequency (Times/Week): 3, Weekly Exercise (Minutes/Week): 90, Intensity: Moderate, Exercise limited by: None identified  Goals    . Increase physical activity     Starting 10/09/2015, I will continue to exercise for at least 30 min at least 2-3 days per week.       Fall Risk Fall Risk  10/09/2015 06/09/2014 03/05/2013  Falls in the past year? No No No   Depression Screen PHQ 2/9 Scores 10/09/2015 06/09/2014 03/05/2013  PHQ - 2 Score 0 0 0    Cognitive Testing MMSE - Mini Mental State Exam 10/09/2015  Orientation to time 5  Orientation to Place 5  Registration 3  Attention/ Calculation 0  Recall 3  Language- name 2 objects 0  Language- repeat 1  Language- follow 3 step command 3  Language- read & follow direction 0  Write a sentence 0  Copy design 0  Total score 20   PLEASE NOTE: A Mini-Cog screen was completed. Maximum score is 20. A value of 0 denotes this part of [place] MMSE was not completed or the patient failed this part of the Mini-Cog screening.   Mini-Cog Screening Orientation to Time - Max 5 pts Orientation to Place - Max 5 pts Registration - Max 3 pts Recall - Max 3 pts Language Repeat - Max 1 pts Language Follow 3 Step Command - Max 3 pts   Immunization History  Administered Date(s) Administered  . Influenza Split 05/16/2011, 02/16/2012  . Influenza,inj,Quad PF,36+ Mos 03/05/2013,  03/10/2014, 05/20/2015  . Pneumococcal Conjugate-13 06/09/2014  . Pneumococcal Polysaccharide-23 07/15/2011   Screening Tests Health Maintenance  Topic Date Due  . FOOT EXAM  10/13/2015 (Originally 07/04/2015)  . OPHTHALMOLOGY EXAM  10/28/2015  . INFLUENZA VACCINE  12/29/2015  . HEMOGLOBIN A1C  04/10/2016  . COLONOSCOPY  05/30/2016  . TETANUS/TDAP  05/30/2018  . ZOSTAVAX  Addressed  . PNA vac Low Risk Adult  Completed      Plan:     I have personally reviewed and addressed the Medicare Annual Wellness questionnaire and have noted the following in the patient's chart:  A. Medical and social history B. Use of alcohol, tobacco or illicit drugs  C. Current  medications and supplements D. Functional ability and status E.  Nutritional status F.  Physical activity G. Advance directives H. List of other physicians I.  Hospitalizations, surgeries, and ER visits in previous 12 months J.  [place] to include hearing, vision, cognitive, depression L. Referrals and appointments - none  In addition, I have reviewed and discussed with patient certain preventive protocols, quality metrics, and best practice recommendations. A written personalized care plan for preventive services as well as general preventive health recommendations were provided to patient.  See attached scanned questionnaire for additional information.   Signed,   Lindell Noe, MHA, BS, LPN Health Advisor [id number]

## 2015-10-09 NOTE — Patient Instructions (Signed)
Ryan Morgan , Thank you for taking time to come for your Medicare Wellness Visit. I appreciate your ongoing commitment to your health goals. Please review the following plan we discussed and let me know if I can assist you in the future.   These are the goals we discussed: Goals    . Increase physical activity     Starting 10/09/2015, I will continue to exercise for at least 30 min at least 2-3 days per week.        This is a list of the screening recommended for you and due dates:  Health Maintenance  Topic Date Due  . Complete foot exam   10/13/2015*  . Eye exam for diabetics  10/28/2015  . Flu Shot  12/29/2015  . Hemoglobin A1C  04/10/2016  . Colon Cancer Screening  05/30/2016  . Tetanus Vaccine  05/30/2018  . Shingles Vaccine  Addressed  . Pneumonia vaccines  Completed  *Topic was postponed. The date shown is not the original due date.   Preventive Care for Adults  A healthy lifestyle and preventive care can promote health and wellness. Preventive health guidelines for adults include the following key practices.  . A routine yearly physical is a good way to check with your health care provider about your health and preventive screening. It is a chance to share any concerns and updates on your health and to receive a thorough exam.  . Visit your dentist for a routine exam and preventive care every 6 months. Brush your teeth twice a day and floss once a day. Good oral hygiene prevents tooth decay and gum disease.  . The frequency of eye exams is based on your age, health, family medical history, use  of contact lenses, and other factors. Follow your health care provider's ecommendations for frequency of eye exams.  . Eat a healthy diet. Foods like vegetables, fruits, whole grains, low-fat dairy products, and lean protein foods contain the nutrients you need without too many calories. Decrease your intake of foods high in solid fats, added sugars, and salt. Eat the right amount of  calories for you. Get information about a proper diet from your health care provider, if necessary.  . Regular physical exercise is one of the most important things you can do for your health. Most adults should get at least 150 minutes of moderate-intensity exercise (any activity that increases your heart rate and causes you to sweat) each week. In addition, most adults need muscle-strengthening exercises on 2 or more days a week.  Silver Sneakers may be a benefit available to you. To determine eligibility, you may visit the website: www.silversneakers.com or contact program at 313-737-7329 Mon-Fri between 8AM-8PM.   . Maintain a healthy weight. The body mass index (BMI) is a screening tool to identify possible weight problems. It provides an estimate of body fat based on height and weight. Your health care provider can find your BMI and can help you achieve or maintain a healthy weight.   For adults 20 years and older: ? A BMI below 18.5 is considered underweight. ? A BMI of 18.5 to 24.9 is normal. ? A BMI of 25 to 29.9 is considered overweight. ? A BMI of 30 and above is considered obese.   . Maintain normal blood lipids and cholesterol levels by exercising and minimizing your intake of saturated fat. Eat a balanced diet with plenty of fruit and vegetables. Blood tests for lipids and cholesterol should begin at age 35 and be repeated  every 5 years. If your lipid or cholesterol levels are high, you are over 50, or you are at high risk for heart disease, you may need your cholesterol levels checked more frequently. Ongoing high lipid and cholesterol levels should be treated with medicines if diet and exercise are not working.  . If you smoke, find out from your health care provider how to quit. If you do not use tobacco, please do not start.  . If you choose to drink alcohol, please do not consume more than 2 drinks per day. One drink is considered to be 12 ounces (355 mL) of beer, 5 ounces  (148 mL) of wine, or 1.5 ounces (44 mL) of liquor.  . If you are 7-70 years old, ask your health care provider if you should take aspirin to prevent strokes.  . Use sunscreen. Apply sunscreen liberally and repeatedly throughout the day. You should seek shade when your shadow is shorter than you. Protect yourself by wearing long sleeves, pants, a wide-brimmed hat, and sunglasses year round, whenever you are outdoors.  . Once a month, do a whole body skin exam, using a mirror to look at the skin on your back. Tell your health care provider of new moles, moles that have irregular borders, moles that are larger than a pencil eraser, or moles that have changed in shape or color.

## 2015-10-09 NOTE — Progress Notes (Signed)
Pre visit review using our clinic review tool, if applicable. No additional management support is needed unless otherwise documented below in the visit note. 

## 2015-10-09 NOTE — Progress Notes (Signed)
.  attest  Subjective:    Patient ID: Ryan Morgan, male    DOB: September 25, 1940, 75 y.o.   MRN: [id number]  HPI  I reviewed health advisor's note, was available for consultation, and agree with documentation and plan.   Review of Systems     Objective:   Physical Exam        Assessment & Plan:

## 2015-10-12 ENCOUNTER — Telehealth: Payer: Self-pay

## 2015-10-12 NOTE — Telephone Encounter (Signed)
Pt requesting refill spironolactone to [facility] [address] pt not out of med; advised I can refill x 1 until seen 10/13/15 for CPX or pt can wait til CPX and possibly get refilled for 1 year. Pt said he will wait until seen tomorrow. Last seen 06/09/14.

## 2015-10-13 ENCOUNTER — Ambulatory Visit (INDEPENDENT_AMBULATORY_CARE_PROVIDER_SITE_OTHER): Payer: Medicare HMO | Admitting: Family Medicine

## 2015-10-13 ENCOUNTER — Encounter: Payer: Self-pay | Admitting: Family Medicine

## 2015-10-13 VITALS — BP 130/62 | HR 82 | Temp 98.1°F | Ht 70.0 in | Wt 247.2 lb

## 2015-10-13 DIAGNOSIS — I10 Essential (primary) hypertension: Secondary | ICD-10-CM

## 2015-10-13 DIAGNOSIS — I739 Peripheral vascular disease, unspecified: Secondary | ICD-10-CM | POA: Diagnosis not present

## 2015-10-13 MED ORDER — SPIRONOLACTONE 25 MG PO TABS: 25.0000 mg | ORAL_TABLET | Freq: Every day | ORAL | Status: DC

## 2015-10-13 NOTE — Patient Instructions (Signed)
Stop at check out for referral for ABI test of legs (to check circulation)  If symptoms suddenly worsen please alert me  If you develop any redness/swelling or heat in your leg also alert me  We will make a plan when results return Let's re schedule your physical for next month some time

## 2015-10-13 NOTE — Assessment & Plan Note (Signed)
Pt has R calf pain when walking No swelling /rubor or erythema on exam and neg homan's sign DP pulse nl but cannot palp PT pulse on either foot High risk for PAD (has CAD and DM and HTN) Send for ABI of both legs Will watch for worsening symptoms or any redness/swelling or signs of dvt

## 2015-10-13 NOTE — Progress Notes (Signed)
Subjective:    Patient ID: Ryan Morgan, male    DOB: 12-Aug-1940, 75 y.o.   MRN: [id number]  HPI Here for leg pain today  Will re schedule PE for another time   R leg Started 3 days ago  No swelling or heat  Just tight when he starts to walk  In the calf   Toes do not turn blue   Does not think he strained it  Has been doing a lot of yard work   Discomfort starts with first step (better with small steps)   No cp or sob above the normal for him  Less stamina lately    Hx of CAD -no hx of PVD   Wt is up 3 lb   BP Readings from Last 3 Encounters:  10/13/15 130/62  10/09/15 118/80  06/17/15 130/64    Tried heat on his calf and it does not help Has not tried ice   Patient Active Problem List   Diagnosis Date Noted  . 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 Rash of hands 03/10/2015  . Heme positive stool 07/22/2014  . Right ear pain 06/09/2014  . Colon cancer screening 06/09/2014  . Acute bronchitis 06/02/2014  . Blepharitis, bilateral 06/02/2014  . Shortness of breath 03/10/2014  . Atypical chest pain 03/10/2014  . Benign paroxysmal positional vertigo 12/14/2013  . Bursitis of right hip 10/17/2013  . Coronary atherosclerosis of native coronary artery 03/19/2013  . Encounter for Medicare annual wellness exam 03/05/2013  . 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 Osteopenia 03/05/2013  . Obesity 07/15/2011  . Prostate cancer screening 07/10/2011  . Headache on top of head 06/29/2011  . Cough 12/23/2010  . Hypertension 08/30/2010  . Hyperlipemia 08/30/2010  . Diabetes mellitus, type 2 ([place]) 08/30/2010  . 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 Diabetes mellitus   . Hypertension   . Heart disease   . Hyperlipidemia   . 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 Coronary artery disease    Past Surgical History  Procedure Laterality Date  . Basal cell carcinoma excision     removed from face x 3  . 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 Cardiac surgery  2000    stent in heart  . Cardiac catheterization    . Coronary angioplasty  09/22/1998    s/p stent placement; Tristar 3.0 x 18 mm Ref [id number]   Social History  Substance Use Topics  . Smoking status: Former Smoker -- 0.50 packs/day for 20 years    Types: Cigarettes    Quit date: 05/30/1978  . Smokeless tobacco: Never Used  . Alcohol Use: No   Family History  Problem Relation Age of Onset  . Cancer Mother     tumor on tonsil  . Stroke Maternal Grandfather    Allergies  Allergen Reactions  . Benicar Hct [Olmesartan Medoxomil-Hctz]     Painful and itchy knots on palms of hands  . Bystolic [Nebivolol Hcl]     headache  . Niaspan [Niacin Er] Other (See Comments)    flushing  . Septra [Bactrim] Hives   Current Outpatient Prescriptions on File Prior to Visit  Medication Sig Dispense Refill  . amLODipine (NORVASC) 10 MG tablet Take 1 tablet (10 mg total) by mouth daily. 90 tablet 0  . aspirin 81 MG tablet Take 81 mg by mouth daily.     Marland Kitchen  atorvastatin (LIPITOR) 40 MG tablet TAKE 1 TABLET BY MOUTH DAILY 90 tablet 0  . cetirizine (ZYRTEC) 10 MG tablet Take 10 mg by mouth 2 (two) times daily.    . cholecalciferol (VITAMIN D) 1000 UNITS tablet Take 1,000 Units by mouth daily. Reported on 10/09/2015    . clobetasol cream (TEMOVATE) [id number] % Apply 1 application topically 2 (two) times daily. To affected areas (Patient taking differently: Apply 1 application topically 2 (two) times daily as needed. To affected areas) 30 g 1  . glimepiride (AMARYL) 4 MG tablet TAKE 1 TABLET (4 MG TOTAL) BY MOUTH DAILY BEFORE BREAKFAST. 90 tablet 0  . glucose blood test strip Use as instructed, 1x a day - One Touch Glucose Strips. Dx code: 250.00 100 each 11  . hydrOXYzine (ATARAX/VISTARIL) 25 MG tablet TAKE ONE TABLET BY MOUTH THREE TIMES DAILY AS NEEDED FOR ITCHING (CAUTION OF SEDATION) 30 tablet 1  .  LANCETS ULTRA THIN 30G MISC by Does not apply route. One Touch    . metFORMIN (GLUCOPHAGE) 1000 MG tablet TAKE 1 TABLET (1,000 MG TOTAL) BY MOUTH 2 (TWO) TIMES DAILY. 180 tablet 0  . nystatin cream (MYCOSTATIN) Apply 1 application topically 2 (two) times daily as needed for dry skin.    . predniSONE (DELTASONE) 10 MG tablet Take 1 tablet (10 mg total) by mouth daily with breakfast. (Patient taking differently: Take 10 mg by mouth as needed. ) 30 tablet 3  . telmisartan (MICARDIS) 80 MG tablet Take 1 tablet (80 mg total) by mouth daily. 90 tablet 0  . Triamcinolone Acetonide (NASACORT ALLERGY 24HR NA) Place 2 sprays into the nose daily as needed.     . [DISCONTINUED] nebivolol (BYSTOLIC) 5 MG tablet Take 1 tablet (5 mg total) by mouth 2 (two) times daily. 180 tablet 3   No current facility-administered medications on file prior to visit.    Review of Systems    Review of Systems  Constitutional: Negative for fever, appetite change, and unexpected weight change.  Eyes: Negative for pain and visual disturbance.  Respiratory: Negative for cough and shortness of breath.  pos for general deconditioning Cardiovascular: Negative for cp or palpitations    Gastrointestinal: Negative for nausea, diarrhea and constipation.  Genitourinary: Negative for urgency and frequency.  Skin: Negative for pallor or rash   MSK pos for pain when walking in R calf and neg for joint swelling  Neurological: Negative for weakness, light-headedness, numbness and headaches.  Hematological: Negative for adenopathy. Does not bruise/bleed easily.  Psychiatric/Behavioral: Negative for dysphoric mood. The patient is not nervous/anxious.      Objective:   Physical Exam  Constitutional: He appears well-developed and well-nourished. No distress.  obese and well appearing   HENT:  Head: Normocephalic and atraumatic.  Mouth/Throat: Oropharynx is clear and moist.  Eyes: Conjunctivae and EOM are normal. Pupils are equal,  round, and reactive to light.  Neck: Normal range of motion. Neck supple. No JVD present. Carotid bruit is not present. No thyromegaly present.  Cardiovascular: Normal rate, regular rhythm and normal heart sounds.  Exam reveals no gallop.   Nl DP pulse in both feet Unable to palp PT pulse well however Nl femoral pulses   Pulmonary/Chest: Effort normal and breath sounds normal. No respiratory distress. He has no wheezes. He has no rales.  No crackles  Abdominal: Soft. Bowel sounds are normal. He exhibits no distension, no abdominal bruit and no mass. There is no tenderness.  Musculoskeletal: He exhibits no edema or tenderness.  R calf-no tenderness/warmth or erythema or swelling  Neg homan's sign  Nl rom ankle and knee  Gait-favors L leg after several steps   Lymphadenopathy:    He has no cervical adenopathy.  Neurological: He is alert. He has normal reflexes.  Skin: Skin is warm and dry. No rash noted.  Psychiatric: He has a normal mood and affect.          Assessment & Plan:   Problem List Items Addressed This Visit      Cardiovascular and Mediastinum   Hypertension    bp in fair control at this time  BP Readings from Last 1 Encounters:  10/13/15 130/62   No changes needed Disc lifstyle change with low sodium diet and exercise  Refilled aldactone  Lab Results  Component Value Date   K 4.5 10/09/2015         Relevant Medications   spironolactone (ALDACTONE) 25 MG tablet     Other   Claudication of calf muscles ([facility]) - Primary    Pt has R calf pain when walking No swelling /rubor or erythema on exam and neg homan's sign DP pulse nl but cannot palp PT pulse on either foot High risk for PAD (has CAD and DM and HTN) Send for ABI of both legs Will watch for worsening symptoms or any redness/swelling or signs of dvt       Relevant Orders   LE ART SEG MULTI (Segm & LE [place]aud[place])

## 2015-10-13 NOTE — Assessment & Plan Note (Signed)
bp in fair control at this time  BP Readings from Last 1 Encounters:  10/13/15 130/62   No changes needed Disc lifstyle change with low sodium diet and exercise  Refilled aldactone  Lab Results  Component Value Date   K 4.5 10/09/2015

## 2015-10-19 ENCOUNTER — Other Ambulatory Visit: Payer: Self-pay | Admitting: Family Medicine

## 2015-10-19 DIAGNOSIS — I739 Peripheral vascular disease, unspecified: Secondary | ICD-10-CM

## 2015-10-19 DIAGNOSIS — R0989 Other specified symptoms and signs involving the circulatory and respiratory systems: Secondary | ICD-10-CM

## 2015-10-23 ENCOUNTER — Encounter (HOSPITAL_COMMUNITY): Payer: Medicare HMO

## 2015-10-29 ENCOUNTER — Ambulatory Visit (HOSPITAL_COMMUNITY)
Admission: RE | Admit: 2015-10-29 | Discharge: 2015-10-29 | Disposition: A | Discharge status: Home / Self Care | Payer: Medicare HMO | Source: Ambulatory Visit | Attending: Family Medicine | Admitting: Family Medicine

## 2015-10-29 DIAGNOSIS — E785 Hyperlipidemia, unspecified: Secondary | ICD-10-CM | POA: Insufficient documentation

## 2015-10-29 DIAGNOSIS — I519 Heart disease, unspecified: Secondary | ICD-10-CM | POA: Insufficient documentation

## 2015-10-29 DIAGNOSIS — R0989 Other specified symptoms and signs involving the circulatory and respiratory systems: Secondary | ICD-10-CM | POA: Diagnosis not present

## 2015-10-29 DIAGNOSIS — I119 Hypertensive heart disease without heart failure: Secondary | ICD-10-CM | POA: Diagnosis not present

## 2015-10-29 DIAGNOSIS — I739 Peripheral vascular disease, unspecified: Secondary | ICD-10-CM | POA: Diagnosis not present

## 2015-10-29 DIAGNOSIS — E119 Type 2 diabetes mellitus without complications: Secondary | ICD-10-CM | POA: Insufficient documentation

## 2015-10-30 ENCOUNTER — Telehealth: Payer: Self-pay | Admitting: Family Medicine

## 2015-10-30 DIAGNOSIS — M79604 Pain in right leg: Secondary | ICD-10-CM | POA: Insufficient documentation

## 2015-10-30 NOTE — Telephone Encounter (Signed)
I did the ref and will route to [facility] 

## 2015-10-30 NOTE — Telephone Encounter (Signed)
-----   Message from Tammi Sou, [place] sent at 10/30/2015 12:07 PM EDT ----- Pt notified of imaging results and Dr. Marliss Coots comments. Pt agrees with referral to Ortho, he would like to see someone in [place], please put referral in and I advised pt our [facility] will call to schedule appt

## 2015-11-04 ENCOUNTER — Ambulatory Visit (INDEPENDENT_AMBULATORY_CARE_PROVIDER_SITE_OTHER): Payer: Medicare HMO | Admitting: Pulmonary Disease

## 2015-11-04 ENCOUNTER — Encounter: Payer: Self-pay | Admitting: Pulmonary Disease

## 2015-11-04 VITALS — BP 140/72 | HR 82 | Ht 71.0 in | Wt 245.0 lb

## 2015-11-04 DIAGNOSIS — M3130 Wegener's granulomatosis without renal involvement: Secondary | ICD-10-CM

## 2015-11-04 MED ORDER — PREDNISONE 10 MG PO TABS: 10.0000 mg | ORAL_TABLET | ORAL | Status: DC | PRN

## 2015-11-04 NOTE — Progress Notes (Signed)
PULMONARY CONSULT NOTE  Requesting MD/Service: self. Primary MD Ryan Morgan Date of initial consultation: 11/04/15 Reason for consultation: Wegener's granulomatosis  PT PROFILE: 75 y.o. M with dx of WG made in 1997 - initial manifestations: arthralgias, sinus symptoms, otalgia. L lung nodule biopsied  Ryan Morgan). Initially treated with cyclophosphamide X 2 years. Managed with intermittent prednisone since then. Previously followed by Dr Ryan Morgan. Never managed by a rheumatologist  HPI:  As aboe. He returns on the day of this evaluation with a chief complaint of R otalgia which is how his Wegener's has manifested previously. He also notes diffuse arthralgias. He denies all respiratory symptoms including CP, fever, purulent sputum, hemoptysis. He has noted recent RLE edema and has had LE US performed which revealed a R Baker's cyst for which he is to see orthopedic surgery next week (Ryan Morgan). He requests a refill of prednisone. He takes short courses of prednisone as needed when his symptoms flare.   Past Medical History  Diagnosis Date  . Diabetes mellitus   . Hypertension   . Heart disease   . Hyperlipidemia   . 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 Coronary artery disease     Past Surgical History  Procedure Laterality Date  . Basal cell carcinoma excision      removed from face x 3  . 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 Cardiac surgery  2000    stent in heart  . Cardiac catheterization    . Coronary angioplasty  09/22/1998    s/p stent placement; Tristar 3.0 x 18 mm Ref [id number]    MEDICATIONS: I have reviewed all medications and confirmed regimen as documented  Social History   Social History  . Marital Status: Married    Spouse Name: N/A  . Number of Children: N/A  . Years of Education: N/A   Occupational History  . Not on file.   Social History Main Topics  . Smoking status: Former Smoker -- 0.50 packs/day for 20 years    Types:  Cigarettes    Quit date: 05/30/1978  . Smokeless tobacco: Never Used  . Alcohol Use: No  . Drug Use: No  . Sexual Activity: No   Other Topics Concern  . Not on file   Social History Narrative   Regular exercise: yes   Caffeine use: very little    Family History  Problem Relation Age of Onset  . Cancer Mother     tumor on tonsil  . Stroke Maternal Grandfather     ROS: No fever, unexplained weight loss or weight gain No new focal weakness or sensory deficits No otalgia, hearing loss, visual changes, nasal and sinus symptoms, mouth and throat problems No neck pain or adenopathy No abdominal pain, N/V/D, diarrhea, change in bowel pattern No dysuria, change in urinary pattern   Filed Vitals:   11/04/15 1030  BP: 140/72  Pulse: 82  Height: 5\' 11"  (1.803 m)  Weight: 245 lb (111.131 kg)  SpO2: 98%     EXAM:  Gen: Obese, No overt respiratory distress HEENT: NCAT, sclera white, oropharynx normal Neck: Supple without LAN, thyromegaly, JVD Lungs: breath sounds: full, percussion: normal, No adventitious sounds Cardiovascular: RRR, no murmurs noted Abdomen: Soft, nontender, normal BS Ext: without clubbing, cyanosis, edema Neuro: CNs grossly intact, motor and sensory intact Skin: Limited exam, no lesions noted  DATA:   BMP Latest Ref Rng 10/09/2015 06/02/2014 02/26/2013  Glucose 70 - 99 mg/dL 155(H) 143(H) 123(H)  BUN  6 - 23 mg/dL 15 16 15   Creatinine 0.40 - 1.50 mg/dL 0.91 0.9 0.8  Sodium 135 - 145 mEq/L 139 141 139  Potassium 3.5 - 5.1 mEq/L 4.5 4.5 4.4  Chloride 96 - 112 mEq/L 102 103 105  CO2 19 - 32 mEq/L 28 28 27   Calcium 8.4 - 10.5 mg/dL 9.3 9.3 9.1    CBC Latest Ref Rng 10/09/2015 06/02/2014 03/11/2014  WBC 4.0 - 10.5 K/uL 4.0 7.5 4.5  Hemoglobin 13.0 - 17.0 g/dL 12.6(L) 13.0 12.6(L)  Hematocrit 39.0 - 52.0 % 36.8(L) 39.0 37.3(L)  Platelets 150.0 - 400.0 K/uL 191.0 229.0 198.0    CXR:  No recent film  IMPRESSION:     ICD-9-CM ICD-10-CM   1. Wegener's  granulomatosis ([place]) 446.4 M31.30 Pulmonary function test     DG Chest 2 View     Ambulatory referral to Rheumatology   I explained to him that I have never managed Wegener's chronically and feel that he would be best served under the care of a rheumatologist. I am more than happy to monitor his pulmonary status as a part of the overall management of this disease  PLAN:  1) referral to Rheumatology (Dr Ryan Morgan) requested 2) Prednisone refilled 3) ROV 4-6 weeks with CXR and PFTs prior   Ryan Border, MD PCCM service Mobile [phone] Pager [phone] 11/04/2015

## 2015-11-04 NOTE — Patient Instructions (Signed)
I have refilled your prednisone prescription which you will take as previously directed  I have made referral to Dr Jefm Bryant of rheumatology  Follow up in 4-6 weeks with CXR and PFTs

## 2015-11-09 ENCOUNTER — Ambulatory Visit (INDEPENDENT_AMBULATORY_CARE_PROVIDER_SITE_OTHER): Payer: Medicare HMO | Admitting: Family Medicine

## 2015-11-09 ENCOUNTER — Encounter: Payer: Self-pay | Admitting: Family Medicine

## 2015-11-09 VITALS — BP 118/66 | HR 76 | Temp 98.0°F | Ht 70.0 in | Wt 246.2 lb

## 2015-11-09 DIAGNOSIS — E1159 Type 2 diabetes mellitus with other circulatory complications: Secondary | ICD-10-CM

## 2015-11-09 DIAGNOSIS — E785 Hyperlipidemia, unspecified: Secondary | ICD-10-CM

## 2015-11-09 DIAGNOSIS — Z0000 Encounter for general adult medical examination without abnormal findings: Secondary | ICD-10-CM

## 2015-11-09 DIAGNOSIS — I10 Essential (primary) hypertension: Secondary | ICD-10-CM | POA: Diagnosis not present

## 2015-11-09 DIAGNOSIS — E669 Obesity, unspecified: Secondary | ICD-10-CM

## 2015-11-09 DIAGNOSIS — H9190 Unspecified hearing loss, unspecified ear: Secondary | ICD-10-CM | POA: Insufficient documentation

## 2015-11-09 DIAGNOSIS — Z125 Encounter for screening for malignant neoplasm of prostate: Secondary | ICD-10-CM

## 2015-11-09 DIAGNOSIS — M8580 Other specified disorders of bone density and structure, unspecified site: Secondary | ICD-10-CM

## 2015-11-09 DIAGNOSIS — Z860100 Personal history of colon polyps, unspecified: Secondary | ICD-10-CM

## 2015-11-09 MED ORDER — TELMISARTAN 80 MG PO TABS: 80.0000 mg | ORAL_TABLET | Freq: Every day | ORAL | Status: DC

## 2015-11-09 MED ORDER — SPIRONOLACTONE 25 MG PO TABS: 25.0000 mg | ORAL_TABLET | Freq: Every day | ORAL | Status: DC

## 2015-11-09 MED ORDER — ATORVASTATIN CALCIUM 40 MG PO TABS: 40.0000 mg | ORAL_TABLET | Freq: Every day | ORAL | Status: DC

## 2015-11-09 MED ORDER — AMLODIPINE BESYLATE 10 MG PO TABS: 10.0000 mg | ORAL_TABLET | Freq: Every day | ORAL | Status: DC

## 2015-11-09 MED ORDER — METFORMIN HCL 1000 MG PO TABS: ORAL_TABLET | ORAL | Status: DC

## 2015-11-09 MED ORDER — GLIMEPIRIDE 4 MG PO TABS: ORAL_TABLET | ORAL | Status: DC

## 2015-11-09 NOTE — Assessment & Plan Note (Signed)
Discussed how this problem influences overall health and the risks it imposes  Reviewed plan for weight loss with lower calorie diet (via better food choices and also portion control or program like weight watchers) and exercise building up to or more than 30 minutes 5 days per week including some aerobic activity    

## 2015-11-09 NOTE — Progress Notes (Signed)
Pre visit review using our clinic review tool, if applicable. No additional management support is needed unless otherwise documented below in the visit note. 

## 2015-11-09 NOTE — Assessment & Plan Note (Addendum)
Reviewed health habits including diet and exercise and skin cancer prevention Reviewed appropriate screening tests for age  Also reviewed health mt list, fam hx and immunization status , as well as social and family history   See HPI Rev AMW visit with Katha Cabal Declines further hearing eval  Enc wt loss  Will get eye exam soon Has not had zostavax since he is on and off prednisone so often

## 2015-11-09 NOTE — Assessment & Plan Note (Signed)
Disc goals for lipids and reasons to control them Rev labs with pt Rev low sat fat diet in detail  Will continue statin Exercise to help HDL

## 2015-11-09 NOTE — Assessment & Plan Note (Signed)
Lab Results  Component Value Date   HGBA1C 8.7* 10/09/2015   This is up due to steroids Now off of them  Doing well with diet and starting exercise  Try to stick to a diabetic diet - eat separately from your family if you have to  Check glucose twice daily for 2 weeks and send me a report since you are not on any steroids right now (am fasting and then 2 hours after afternoon or evening meal)  Follow up in 3 months with labs prior

## 2015-11-09 NOTE — Assessment & Plan Note (Signed)
Lab Results  Component Value Date   PSA 0.69 10/09/2015   PSA 0.83 06/02/2014   PSA 0.61 02/26/2013   No symptoms  Continue to follow

## 2015-11-09 NOTE — Assessment & Plan Note (Signed)
dexa was in 2014 and in the nl range  Disc need for calcium/ vitamin D/ wt bearing exercise and bone density test every 2 y to monitor Disc safety/ fracture risk in detail

## 2015-11-09 NOTE — Progress Notes (Signed)
Subjective:    Patient ID: Ryan Morgan, male    DOB: [birth year], 75 y.o.   MRN: [id number]  HPI  Here for health maintenance exam and to review chronic medical problems   Sees ortho next wk about ? Baker's cyst  About the same symptoms    Had AMW visit last month  Failed hearing exam  Problems with hearing for 20 years -not ready for hearing aide-does not bother him or family much at this point  Background noise bothers him   Eye exam = coming up next week  Colonoscopy 2016- found only one tiny polyp -removed it Unsure about f/u  Goes to [place]   Declines zostavax (on prednisone)   Wt  Is up 1 lb with bmi of 35  dexa nl 10/14- normal range  Hx of low D in the past  No falls No fractures   IFOB was pos 2/16-was ref to GI and had small polyp on colonoscopy  Prostate screen Lab Results  Component Value Date   PSA 0.69 10/09/2015   PSA 0.83 06/02/2014   PSA 0.61 02/26/2013  good psa  No prostate symptoms  Nocturia 0-1    bp is stable today  No cp or palpitations or headaches or edema  No side effects to medicines  BP Readings from Last 3 Encounters:  11/09/15 118/66  11/04/15 140/72  10/13/15 130/62     DM2 Lab Results  Component Value Date   HGBA1C 8.7* 10/09/2015  went up from 6.7  Has been on more steroids the last several months- for wegners (on currently on 10 mg - right now he is off of it)  Does not check often - no very high or low readings  Diet- - ate too many strawberries  Otherwise good  Exercise- working in garden and shop and working on [employer]  Treadmill also  On amaryl and metformin    Hyperlipidemia Lab Results  Component Value Date   CHOL 130 10/09/2015   CHOL 134 06/02/2014   CHOL 131 02/26/2013   Lab Results  Component Value Date   HDL 30.40* 10/09/2015   HDL 36.70* 06/02/2014   HDL 35.30* 02/26/2013   Lab Results  Component Value Date   LDLCALC 68 06/02/2014   LDLCALC 57 02/26/2013   LDLCALC 57 10/17/2011   Lab  Results  Component Value Date   TRIG 211.0* 10/09/2015   TRIG 148.0 06/02/2014   TRIG 195.0* 02/26/2013   Lab Results  Component Value Date   CHOLHDL 4 10/09/2015   CHOLHDL 4 06/02/2014   CHOLHDL 4 02/26/2013   Lab Results  Component Value Date   LDLDIRECT 69.0 10/09/2015   LDLDIRECT 65.7 08/13/2012    Overall good with diet and statin  Plans to walk more to get the HDL up   Lab Results  Component Value Date   WBC 4.0 10/09/2015   HGB 12.6* 10/09/2015   HCT 36.8* 10/09/2015   MCV 91.3 10/09/2015   PLT 191.0 10/09/2015   baseline for him     Chemistry      Component Value Date/Time   NA 139 10/09/2015 1034   K 4.5 10/09/2015 1034   CL 102 10/09/2015 1034   CO2 28 10/09/2015 1034   BUN 15 10/09/2015 1034   CREATININE 0.91 10/09/2015 1034   CREATININE 0.89 12/26/2012 1106      Component Value Date/Time   CALCIUM 9.3 10/09/2015 1034   ALKPHOS 63 10/09/2015 1034   AST 18 10/09/2015 1034  ALT 24 10/09/2015 1034   BILITOT 0.5 10/09/2015 1034      Lab Results  Component Value Date   TSH 1.56 10/09/2015    Patient Active Problem List   Diagnosis Date Noted  . Routine general medical examination at a health care facility 11/09/2015  . Hearing loss 11/09/2015  . History of colonic polyps 11/09/2015  . Right leg pain 10/30/2015  . Claudication of calf muscles ([place]) 10/13/2015  . Rash of hands 03/10/2015  . Heme positive stool 07/22/2014  . Right ear pain 06/09/2014  . Colon cancer screening 06/09/2014  . Blepharitis, bilateral 06/02/2014  . Shortness of breath 03/10/2014  . Atypical chest pain 03/10/2014  . Benign paroxysmal positional vertigo 12/14/2013  . Bursitis of right hip 10/17/2013  . Coronary atherosclerosis of native coronary artery 03/19/2013  . Encounter for Medicare annual wellness exam 03/05/2013  . 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 Osteopenia 03/05/2013  . Obesity 07/15/2011  . Prostate cancer screening  07/10/2011  . Headache on top of head 06/29/2011  . Cough 12/23/2010  . Hypertension 08/30/2010  . Hyperlipemia 08/30/2010  . Diabetes mellitus, type 2 ([place]) 08/30/2010  . 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 Diabetes mellitus   . Hypertension   . Heart disease   . Hyperlipidemia   . 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 Coronary artery disease    Past Surgical History  Procedure Laterality Date  . Basal cell carcinoma excision      removed from face x 3  . 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 Cardiac surgery  2000    stent in heart  . Cardiac catheterization    . Coronary angioplasty  09/22/1998    s/p stent placement; Tristar 3.0 x 18 mm Ref [id number]   Social History  Substance Use Topics  . Smoking status: Former Smoker -- 0.50 packs/day for 20 years    Types: Cigarettes    Quit date: 05/30/1978  . Smokeless tobacco: Never Used  . Alcohol Use: No   Family History  Problem Relation Age of Onset  . Cancer Mother     tumor on tonsil  . Stroke Maternal Grandfather    Allergies  Allergen Reactions  . Benicar Hct [Olmesartan Medoxomil-Hctz]     Painful and itchy knots on palms of hands  . Bystolic [Nebivolol Hcl]     headache  . Niaspan [Niacin Er] Other (See Comments)    flushing  . Septra [Bactrim] Hives   Current Outpatient Prescriptions on File Prior to Visit  Medication Sig Dispense Refill  . aspirin 81 MG tablet Take 81 mg by mouth daily.     . cetirizine (ZYRTEC) 10 MG tablet Take 10 mg by mouth 2 (two) times daily.    . cholecalciferol (VITAMIN D) 1000 UNITS tablet Take 1,000 Units by mouth daily. Reported on 10/09/2015    . clobetasol cream (TEMOVATE) [id number] % Apply 1 application topically 2 (two) times daily. To affected areas (Patient taking differently: Apply 1 application topically 2 (two) times daily as needed. To affected areas) 30 g 1  . glucose blood test strip Use as  instructed, 1x a day - One Touch Glucose Strips. Dx code: 250.00 100 each 11  . hydrOXYzine (ATARAX/VISTARIL) 25 MG tablet TAKE ONE TABLET BY MOUTH THREE TIMES DAILY AS NEEDED FOR ITCHING (CAUTION OF SEDATION) 30 tablet 1  . [place]  30G MISC by Does not apply route. One Touch    . nystatin cream (MYCOSTATIN) Apply 1 application topically 2 (two) times daily as needed for dry skin.    . predniSONE (DELTASONE) 10 MG tablet Take 1 tablet (10 mg total) by mouth as needed. 30 tablet 3  . Triamcinolone Acetonide (NASACORT ALLERGY 24HR NA) Place 2 sprays into the nose daily as needed.     . [DISCONTINUED] nebivolol (BYSTOLIC) 5 MG tablet Take 1 tablet (5 mg total) by mouth 2 (two) times daily. 180 tablet 3   No current facility-administered medications on file prior to visit.    Review of Systems Review of Systems  Constitutional: Negative for fever, appetite change, fatigue and unexpected weight change.  Eyes: Negative for pain and visual disturbance.  Respiratory: Negative for cough and pos for occ shortness of breath.   Cardiovascular: Negative for cp or palpitations    Gastrointestinal: Negative for nausea, diarrhea and constipation.  Genitourinary: Negative for urgency and frequency.  Skin: Negative for pallor or rash   Neurological: Negative for weakness, light-headedness, numbness and headaches.  Hematological: Negative for adenopathy. Does not bruise/bleed easily.  Psychiatric/Behavioral: Negative for dysphoric mood. The patient is not nervous/anxious.         Objective:   Physical Exam  Constitutional: He appears well-developed and well-nourished. No distress.  obese and well appearing   HENT:  Head: Normocephalic and atraumatic.  Right Ear: External ear normal.  Left Ear: External ear normal.  Nose: Nose normal.  Mouth/Throat: Oropharynx is clear and moist.  Eyes: Conjunctivae and EOM are normal. Pupils are equal, round, and reactive to light. Right eye exhibits no  discharge. Left eye exhibits no discharge. No scleral icterus.  Neck: Normal range of motion. Neck supple. No JVD present. Carotid bruit is not present. No thyromegaly present.  Cardiovascular: Normal rate, regular rhythm, normal heart sounds and intact distal pulses.  Exam reveals no gallop.   Pulmonary/Chest: Effort normal and breath sounds normal. No respiratory distress. He has no wheezes. He exhibits no tenderness.  Diffusely distant bs  No crackles   Abdominal: Soft. Bowel sounds are normal. He exhibits no distension, no abdominal bruit and no mass. There is no tenderness.  Musculoskeletal: He exhibits no edema or tenderness.  Lymphadenopathy:    He has no cervical adenopathy.  Neurological: He is alert. He has normal reflexes. No cranial nerve deficit. He exhibits normal muscle tone. Coordination normal.  Skin: Skin is warm and dry. No rash noted. No erythema. No pallor.  SKs and AKs diffusely with lentigines   Psychiatric: He has a normal mood and affect.          Assessment & Plan:   Problem List Items Addressed This Visit      Cardiovascular and Mediastinum   Hypertension - Primary    bp in fair control at this time  BP Readings from Last 1 Encounters:  11/09/15 118/66   No changes needed Disc lifstyle change with low sodium diet and exercise  Labs reviewed Enc wt loss       Relevant Medications   spironolactone (ALDACTONE) 25 MG tablet   telmisartan (MICARDIS) 80 MG tablet   atorvastatin (LIPITOR) 40 MG tablet   amLODipine (NORVASC) 10 MG tablet     Endocrine   Diabetes mellitus, type 2 (HCC)    Lab Results  Component Value Date   HGBA1C 8.7* 10/09/2015   This is up due to steroids Now off of them  Doing well  with diet and starting exercise  Try to stick to a diabetic diet - eat separately from your family if you have to  Check glucose twice daily for 2 weeks and send me a report since you are not on any steroids right now (am fasting and then 2 hours  after afternoon or evening meal)  Follow up in 3 months with labs prior       Relevant Medications   metFORMIN (GLUCOPHAGE) 1000 MG tablet   glimepiride (AMARYL) 4 MG tablet   telmisartan (MICARDIS) 80 MG tablet   atorvastatin (LIPITOR) 40 MG tablet     Nervous and Auditory   Hearing loss    Pt declines further eval or treatment         Musculoskeletal and Integument   Osteopenia    dexa was in 2014 and in the nl range  Disc need for calcium/ vitamin D/ wt bearing exercise and bone density test every 2 y to monitor Disc safety/ fracture risk in detail          Other   Routine general medical examination at a health care facility    Reviewed health habits including diet and exercise and skin cancer prevention Reviewed appropriate screening tests for age  Also reviewed health mt list, fam hx and immunization status , as well as social and family history   See HPI Rev AMW visit with Katha Cabal Declines further hearing eval  Enc wt loss  Will get eye exam soon Has not had zostavax since he is on and off prednisone so often        Prostate cancer screening    Lab Results  Component Value Date   PSA 0.69 10/09/2015   PSA 0.83 06/02/2014   PSA 0.61 02/26/2013   No symptoms  Continue to follow      Obesity    Discussed how this problem influences overall health and the risks it imposes  Reviewed plan for weight loss with lower calorie diet (via better food choices and also portion control or program like weight watchers) and exercise building up to or more than 30 minutes 5 days per week including some aerobic activity         Relevant Medications   metFORMIN (GLUCOPHAGE) 1000 MG tablet   glimepiride (AMARYL) 4 MG tablet   Hyperlipemia    Disc goals for lipids and reasons to control them Rev labs with pt Rev low sat fat diet in detail  Will continue statin Exercise to help HDL      Relevant Medications   spironolactone (ALDACTONE) 25 MG tablet   telmisartan  (MICARDIS) 80 MG tablet   atorvastatin (LIPITOR) 40 MG tablet   amLODipine (NORVASC) 10 MG tablet   History of colonic polyps    utd colonoscopy

## 2015-11-09 NOTE — Assessment & Plan Note (Signed)
utd colonoscopy 

## 2015-11-09 NOTE — Assessment & Plan Note (Signed)
Pt declines further eval or treatment 

## 2015-11-09 NOTE — Assessment & Plan Note (Signed)
bp in fair control at this time  BP Readings from Last 1 Encounters:  11/09/15 118/66   No changes needed Disc lifstyle change with low sodium diet and exercise  Labs reviewed Enc wt loss

## 2015-11-09 NOTE — Patient Instructions (Signed)
Try to stick to a diabetic diet - eat separately from your family if you have to  Check glucose twice daily for 2 weeks and send me a report since you are not on any steroids right now (am fasting and then 2 hours after afternoon or evening meal)  Follow up in 3 months with labs prior

## 2015-11-11 DIAGNOSIS — M79642 Pain in left hand: Secondary | ICD-10-CM | POA: Diagnosis not present

## 2015-11-11 DIAGNOSIS — M19041 Primary osteoarthritis, right hand: Secondary | ICD-10-CM | POA: Diagnosis not present

## 2015-11-11 DIAGNOSIS — E119 Type 2 diabetes mellitus without complications: Secondary | ICD-10-CM | POA: Diagnosis not present

## 2015-11-11 DIAGNOSIS — M3130 Wegener's granulomatosis without renal involvement: Secondary | ICD-10-CM | POA: Diagnosis not present

## 2015-11-11 DIAGNOSIS — M79641 Pain in right hand: Secondary | ICD-10-CM | POA: Diagnosis not present

## 2015-11-11 DIAGNOSIS — M19042 Primary osteoarthritis, left hand: Secondary | ICD-10-CM | POA: Diagnosis not present

## 2015-11-12 DIAGNOSIS — M170 Bilateral primary osteoarthritis of knee: Secondary | ICD-10-CM | POA: Diagnosis not present

## 2015-11-24 DIAGNOSIS — E113293 Type 2 diabetes mellitus with mild nonproliferative diabetic retinopathy without macular edema, bilateral: Secondary | ICD-10-CM | POA: Diagnosis not present

## 2015-11-24 LAB — HM DIABETES EYE EXAM

## 2015-12-02 ENCOUNTER — Encounter: Payer: Self-pay | Admitting: Family Medicine

## 2015-12-07 ENCOUNTER — Ambulatory Visit
Admission: RE | Admit: 2015-12-07 | Discharge: 2015-12-07 | Disposition: A | Discharge status: Home / Self Care | Payer: Medicare HMO | Source: Ambulatory Visit | Attending: Pulmonary Disease | Admitting: Pulmonary Disease

## 2015-12-07 ENCOUNTER — Ambulatory Visit (INDEPENDENT_AMBULATORY_CARE_PROVIDER_SITE_OTHER): Payer: Medicare HMO | Admitting: *Deleted

## 2015-12-07 DIAGNOSIS — M3130 Wegener's granulomatosis without renal involvement: Secondary | ICD-10-CM

## 2015-12-07 LAB — PULMONARY FUNCTION TEST
DL/VA % PRED: 96 %
DL/VA: 4.42 ml/min/mmHg/L
DLCO unc % pred: 94 %
DLCO unc: 30.5 ml/min/mmHg
FEF 25-75 Post: 3.41 L/sec
FEF 25-75 Pre: 3.08 L/sec
FEF2575-%Change-Post: 10 %
FEF2575-%Pred-Post: 147 %
FEF2575-%Pred-Pre: 133 %
FEV1-%CHANGE-POST: 2 %
FEV1-%PRED-POST: 105 %
FEV1-%PRED-PRE: 102 %
FEV1-PRE: 3.21 L
FEV1-Post: 3.29 L
FEV1FVC-%Change-Post: 3 %
FEV1FVC-%Pred-Pre: 107 %
FEV6-%Change-Post: 0 %
FEV6-%PRED-PRE: 99 %
FEV6-%Pred-Post: 99 %
FEV6-POST: 4.04 L
FEV6-Pre: 4 L
FEV6FVC-%Change-Post: 0 %
FEV6FVC-%PRED-POST: 106 %
FEV6FVC-%PRED-PRE: 106 %
FVC-%Change-Post: -1 %
FVC-%PRED-PRE: 94 %
FVC-%Pred-Post: 93 %
FVC-POST: 4.04 L
FVC-PRE: 4.08 L
POST FEV6/FVC RATIO: 100 %
PRE FEV6/FVC RATIO: 100 %
Post FEV1/FVC ratio: 82 %
Pre FEV1/FVC ratio: 79 %
RV % pred: 116 %
RV: 2.94 L
TLC % PRED: 99 %
TLC: 7.01 L

## 2015-12-07 NOTE — Progress Notes (Signed)
PFT performed today. 

## 2015-12-16 ENCOUNTER — Telehealth: Payer: Self-pay | Admitting: Family Medicine

## 2015-12-16 MED ORDER — GLIMEPIRIDE 4 MG PO TABS: ORAL_TABLET | ORAL | Status: DC

## 2015-12-16 NOTE — Telephone Encounter (Signed)
Please let pt know that I reviewed his blood glucose log (now off prednisone) and his numbers are quite labile -please ask him how he is doing with sticking to a diabetic diet?  Definitely pm glucose levels are higher  I would like to increase his amaryl from 4 to 8 mg in the am (watching closely for low readings) -then let us know in 2 weeks how you are doing  If low readings -drop back on it and let us know earlier  I will change med for call in if he is agreeable

## 2015-12-17 ENCOUNTER — Encounter: Payer: Self-pay | Admitting: Family Medicine

## 2015-12-17 NOTE — Telephone Encounter (Signed)
Left voicemail requesting pt to call the office back 

## 2015-12-18 ENCOUNTER — Encounter: Payer: Self-pay | Admitting: Cardiovascular Disease

## 2015-12-18 ENCOUNTER — Ambulatory Visit (INDEPENDENT_AMBULATORY_CARE_PROVIDER_SITE_OTHER): Payer: Medicare HMO | Admitting: Cardiovascular Disease

## 2015-12-18 VITALS — BP 118/64 | HR 76 | Ht 70.5 in | Wt 244.5 lb

## 2015-12-18 DIAGNOSIS — I10 Essential (primary) hypertension: Secondary | ICD-10-CM

## 2015-12-18 DIAGNOSIS — R0602 Shortness of breath: Secondary | ICD-10-CM

## 2015-12-18 DIAGNOSIS — I2510 Atherosclerotic heart disease of native coronary artery without angina pectoris: Secondary | ICD-10-CM

## 2015-12-18 DIAGNOSIS — R0789 Other chest pain: Secondary | ICD-10-CM

## 2015-12-18 DIAGNOSIS — E785 Hyperlipidemia, unspecified: Secondary | ICD-10-CM | POA: Diagnosis not present

## 2015-12-18 DIAGNOSIS — E669 Obesity, unspecified: Secondary | ICD-10-CM

## 2015-12-18 DIAGNOSIS — E11319 Type 2 diabetes mellitus with unspecified diabetic retinopathy without macular edema: Secondary | ICD-10-CM

## 2015-12-18 NOTE — Progress Notes (Signed)
Patient ID: Ryan Morgan, male   DOB: August 10, 1940, 75 y.o.   MRN: [id number] Cardiology Office Note  Date:  12/18/2015   ID:  Ryan Morgan, DOB 08-20-1940, MRN [id number]  PCP:  Loura Pardon, MD   Chief Complaint  Patient presents with  . other    6 month f/u. Meds reviewed verbally with pt.    HPI:  Mr. Dorrough is a very pleasant 75 year old gentleman with a history of DM, smoking hx, coronary artery disease, stent placed to his LAD in 2000, repeat catheterization in January 2003 with 40% LAD, 50% diagonal disease, normal ejection fraction who presents for follow-up of his coronary artery disease.  He has a long history of Wegener's granulomatosis, diabetes. Prior thoracotomy surgery on the left for lung nodule, chronic pain on the left that is episodic, stable over several years He is on chronic prednisone for Wegener's  In follow-up today, reports that he is doing relatively well Wife who presents with him today reports that he has shortness of breath when he comes in from working outside No prior echocardiogram, stress test in 2009 done in [place] Denies any chest pain or shortness of breath symptoms on exertion Very active at baseline, does gardening, washes and waxes motorhome etc. Denies any significant lower extremity edema Occasional chest tightness at rest in the center of his chest, in fact reports having some in the room today. 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 He is active, does some exercise but not irregular exercise program. He's been trying to work on his weight. This continues to be a struggle. Hemoglobin A1c 7.1 by his report. He is tolerating his other medications  well. Blood pressures typically well controlled, more elevated today.  Stress test from January 2009 was a treadmill study, ejection fraction estimated at 45%, with medium-sized area of moderate to severe hypoperfusion involving the septal region.  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  PSH:    Past Surgical History  Procedure Laterality Date  . Basal cell carcinoma excision      removed from face x 3  . 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 Cardiac surgery  2000    stent in heart  . Cardiac catheterization    . Coronary angioplasty  09/22/1998    s/p stent placement; Tristar 3.0 x 18 mm Ref [id number]    Current Outpatient Prescriptions  Medication Sig Dispense Refill  . amLODipine (NORVASC) 10 MG tablet Take 1 tablet (10 mg total) by mouth daily. 90 tablet 3  . aspirin 81 MG tablet Take 81 mg by mouth daily.     Marland Kitchen atorvastatin (LIPITOR) 40 MG tablet Take 1 tablet (40 mg total) by mouth daily. 90 tablet 3  . cetirizine (ZYRTEC) 10 MG tablet Take 10 mg by mouth 2 (two) times daily.    . cholecalciferol (VITAMIN D) 1000 UNITS tablet Take 1,000 Units by mouth as needed. Reported on 10/09/2015    . clobetasol cream (TEMOVATE) 0.05 % Apply 1  application topically 2 (two) times daily. To affected areas (Patient taking differently: Apply 1 application topically 2 (two) times daily as needed. To affected areas) 30 g 1  . glimepiride (AMARYL) 4 MG tablet TAKE 2 TABLETS (8 MG TOTAL) BY MOUTH DAILY BEFORE BREAKFAST. 180 tablet 3  . glucose blood test strip Use as instructed, 1x a day - One Touch Glucose Strips. Dx code: 250.00 100 each 11  . hydrOXYzine (ATARAX/VISTARIL) 25 MG tablet TAKE ONE TABLET BY MOUTH THREE TIMES DAILY AS NEEDED FOR ITCHING (CAUTION OF SEDATION) 30 tablet 1  . LANCETS ULTRA THIN 30G MISC by  Does not apply route. One Touch    . metFORMIN (GLUCOPHAGE) 1000 MG tablet TAKE 1 TABLET (1,000 MG TOTAL) BY MOUTH 2 (TWO) TIMES DAILY. 180 tablet 3  . nystatin cream (MYCOSTATIN) Apply 1 application topically 2 (two) times daily as needed for dry skin.    . predniSONE (DELTASONE) 10 MG tablet Take 1 tablet (10 mg total) by mouth as needed. 30 tablet 3  . spironolactone (ALDACTONE) 25 MG tablet Take 1 tablet (25 mg total) by mouth daily. 90 tablet 3  . telmisartan (MICARDIS) 80 MG tablet Take 1 tablet (80 mg total) by mouth daily. 90 tablet 3  . Triamcinolone Acetonide (NASACORT ALLERGY 24HR NA) Place 2 sprays into the nose daily as needed.     . [DISCONTINUED] nebivolol (BYSTOLIC) 5 MG tablet Take 1 tablet (5 mg total) by mouth 2 (two) times daily. 180 tablet 3   No current facility-administered medications for this visit.     Allergies:   Benicar hct; Bystolic; Niaspan; and Septra   Social History:  The patient  reports that he quit smoking about 37 years ago. His smoking use included Cigarettes. He has a 10 pack-year smoking history. He has never used smokeless tobacco. He reports that he does not drink alcohol or use illicit drugs.   Family History:   family history includes Cancer in his mother; Stroke in his maternal grandfather.    Review of Systems: Review of Systems  Constitutional: Negative.   Respiratory: Positive for shortness of breath.   Cardiovascular: Positive for chest pain.  Gastrointestinal: Negative.   Musculoskeletal: Negative.   Neurological: Negative.   Psychiatric/Behavioral: Negative.   All other systems reviewed and are negative.    PHYSICAL EXAM: VS:  BP 118/64 mmHg  Pulse 76  Ht 5' 10.5" (1.791 m)  Wt 244 lb 8 oz (110.904 kg)  BMI 34.57 kg/m2 , BMI Body mass index is 34.57 kg/(m^2). GEN: Well nourished, well developed, in no acute distress, obese HEENT: normal Neck: no JVD, carotid bruits, or masses Cardiac: RRR; no murmurs, rubs, or gallops,no  edema  Respiratory:  clear to auscultation bilaterally, normal work of breathing GI: soft, nontender, nondistended, + BS MS: no deformity or atrophy Skin: warm and dry, no rash Neuro:  Strength and sensation are intact Psych: euthymic mood, full affect    Recent Labs: 10/09/2015: ALT 24; BUN 15; Creatinine, Ser 0.91; Hemoglobin 12.6*; Platelets 191.0; Potassium 4.5; Sodium 139; TSH 1.56    Lipid Panel Lab Results  Component Value Date   CHOL 130 10/09/2015   HDL 30.40* 10/09/2015   LDLCALC 68 06/02/2014   TRIG 211.0* 10/09/2015      Wt Readings from Last 3 Encounters:  12/18/15 244 lb 8 oz (110.904 kg)  11/09/15 246 lb 4 oz (111.698 kg)  11/04/15 245 lb (111.131 kg)       ASSESSMENT AND PLAN:  Atherosclerosis  of native coronary artery of native heart without angina pectoris - Plan: EKG 12-Lead Currently with no symptoms of angina. No further workup at this time. Continue current medication regimen.  Essential hypertension Blood pressure is well controlled on today's visit. No changes made to the medications.  Hyperlipemia Cholesterol is at goal on the current lipid regimen. No changes to the medications were made.  Shortness of breath Wife reports he is short of breath after getting in to the house from exerting himself outside. He does not think it is a problem, otherwise sounds very active outside We did offer baseline echocardiogram, he has declined at this time  Atypical chest pain Pain with certain movements, not associated with exertion Long discussion that if symptoms get worse, more frequent, that he call the office and we would order stress test  Obesity We have encouraged continued exercise, careful diet management in an effort to lose weight.  Type 2 diabetes mellitus with retinopathy without macular edema, without long-term current use of insulin, unspecified laterality, unspecified retinopathy severity ([place]) We have stressed importance of aggressive  diabetes control Recent worsening of his hemoglobin A1c secondary to dietary indiscretion Recommended low carbohydrate diet  Disposition:   F/U  6 months   Orders Placed This Encounter  Procedures  . EKG 12-Lead     Signed, Esmond Plants, M.D., Ph.D. 12/18/2015  [place], [place]

## 2015-12-18 NOTE — Telephone Encounter (Signed)
Left voicemail requesting pt to either call office back or respond to mychart. I sent him this message via mychart

## 2015-12-18 NOTE — Patient Instructions (Signed)
Medication Instructions:   No medication changes  Watch the carbs  Follow-Up: It was a pleasure seeing you in the office today. Please call us if you have new issues that need to be addressed before your next appt.  [phone]  Your physician wants you to follow-up in: 12 months.  You will receive a reminder letter in the mail two months in advance. If you don't receive a letter, please call our office to schedule the follow-up appointment.  If you need a refill on your cardiac medications before your next appointment, please call your pharmacy.

## 2015-12-23 ENCOUNTER — Encounter: Payer: Self-pay | Admitting: Pulmonary Disease

## 2015-12-23 ENCOUNTER — Ambulatory Visit (INDEPENDENT_AMBULATORY_CARE_PROVIDER_SITE_OTHER): Payer: Medicare HMO | Admitting: Pulmonary Disease

## 2015-12-23 VITALS — BP 122/60 | HR 82 | Ht 70.5 in | Wt 247.6 lb

## 2015-12-23 DIAGNOSIS — M3130 Wegener's granulomatosis without renal involvement: Secondary | ICD-10-CM

## 2015-12-23 NOTE — Progress Notes (Signed)
PROBLEMS: Wegener's granulomatosis  INTERVAL HISTORY: Saw Dr Meda Coffee, [facility] - no new therapies rendered  SUBJ: No new complaints. No distress. Denies CP, fever, purulent sputum, hemoptysis, LE edema and calf tenderness  OBJ: Vitals:   12/23/15 0918  BP: 122/60  Pulse: 82  SpO2: 98%  Weight: 247 lb 9.6 oz (112.3 kg)  Height: 5' 10.5" (1.791 m)  RA  NAD, obese HEENT saddle bridge nose, NAD No JVD BS full, no adventitious sounds RRR s M Obese, soft, NABS No LE edema No focal neuro deficits No rashes  DATA: CXR (12/07/15):  Chronic changes, NAD PFTs (12/07/15): normal spirometry, lung volumes, DLCO  IMPRESSION: Wegener's granulomatosis - appears to be quiescent presently. No evidence of acute pulmonary involvement  PLAN: Continue monitoring off therapy. Follow up PRN   Merton Border, MD PCCM service Mobile [phone] Pager [phone] 12/23/2015

## 2016-01-11 ENCOUNTER — Encounter: Payer: Self-pay | Admitting: Family Medicine

## 2016-01-11 MED ORDER — GLIMEPIRIDE 4 MG PO TABS: ORAL_TABLET | ORAL | 3 refills | Status: DC

## 2016-01-26 ENCOUNTER — Other Ambulatory Visit: Payer: Self-pay

## 2016-01-26 DIAGNOSIS — R69 Illness, unspecified: Secondary | ICD-10-CM | POA: Diagnosis not present

## 2016-01-26 MED ORDER — GLUCOSE BLOOD VI STRP: ORAL_STRIP | 1 refills | Status: DC

## 2016-01-26 NOTE — Telephone Encounter (Signed)
Ryan Morgan request refill one touch ultra test strips to [facility] [address]. Last annual 11/09/15. Refill done per protocol and Ryan Morgan voiced understanding.

## 2016-02-11 DIAGNOSIS — M3130 Wegener's granulomatosis without renal involvement: Secondary | ICD-10-CM | POA: Diagnosis not present

## 2016-02-11 DIAGNOSIS — E1159 Type 2 diabetes mellitus with other circulatory complications: Secondary | ICD-10-CM | POA: Diagnosis not present

## 2016-02-11 DIAGNOSIS — Z0000 Encounter for general adult medical examination without abnormal findings: Secondary | ICD-10-CM | POA: Diagnosis not present

## 2016-02-11 DIAGNOSIS — E7800 Pure hypercholesterolemia, unspecified: Secondary | ICD-10-CM | POA: Diagnosis not present

## 2016-02-11 DIAGNOSIS — I10 Essential (primary) hypertension: Secondary | ICD-10-CM | POA: Diagnosis not present

## 2016-03-25 DIAGNOSIS — D225 Melanocytic nevi of trunk: Secondary | ICD-10-CM | POA: Diagnosis not present

## 2016-03-25 DIAGNOSIS — D485 Neoplasm of uncertain behavior of skin: Secondary | ICD-10-CM | POA: Diagnosis not present

## 2016-03-25 DIAGNOSIS — L570 Actinic keratosis: Secondary | ICD-10-CM | POA: Diagnosis not present

## 2016-03-25 DIAGNOSIS — C44319 Basal cell carcinoma of skin of other parts of face: Secondary | ICD-10-CM | POA: Diagnosis not present

## 2016-03-25 DIAGNOSIS — D2261 Melanocytic nevi of right upper limb, including shoulder: Secondary | ICD-10-CM | POA: Diagnosis not present

## 2016-03-25 DIAGNOSIS — Z85828 Personal history of other malignant neoplasm of skin: Secondary | ICD-10-CM | POA: Diagnosis not present

## 2016-03-25 DIAGNOSIS — X32XXXA Exposure to sunlight, initial encounter: Secondary | ICD-10-CM | POA: Diagnosis not present

## 2016-03-25 DIAGNOSIS — D2272 Melanocytic nevi of left lower limb, including hip: Secondary | ICD-10-CM | POA: Diagnosis not present

## 2016-03-25 DIAGNOSIS — D0439 Carcinoma in situ of skin of other parts of face: Secondary | ICD-10-CM | POA: Diagnosis not present

## 2016-03-26 ENCOUNTER — Telehealth: Payer: Self-pay

## 2016-03-26 NOTE — Telephone Encounter (Signed)
Pt is due for his 3 month follow up. Let me know if he is able to schedule before the end of the year.

## 2016-03-30 ENCOUNTER — Ambulatory Visit (INDEPENDENT_AMBULATORY_CARE_PROVIDER_SITE_OTHER): Payer: Medicare HMO

## 2016-03-30 DIAGNOSIS — Z23 Encounter for immunization: Secondary | ICD-10-CM | POA: Diagnosis not present

## 2016-05-12 DIAGNOSIS — D0439 Carcinoma in situ of skin of other parts of face: Secondary | ICD-10-CM | POA: Diagnosis not present

## 2016-05-12 DIAGNOSIS — L905 Scar conditions and fibrosis of skin: Secondary | ICD-10-CM | POA: Diagnosis not present

## 2016-06-08 DIAGNOSIS — C44311 Basal cell carcinoma of skin of nose: Secondary | ICD-10-CM | POA: Diagnosis not present

## 2016-06-08 DIAGNOSIS — L578 Other skin changes due to chronic exposure to nonionizing radiation: Secondary | ICD-10-CM | POA: Diagnosis not present

## 2016-06-08 DIAGNOSIS — Z85828 Personal history of other malignant neoplasm of skin: Secondary | ICD-10-CM | POA: Diagnosis not present

## 2016-06-08 DIAGNOSIS — L905 Scar conditions and fibrosis of skin: Secondary | ICD-10-CM | POA: Diagnosis not present

## 2016-06-08 DIAGNOSIS — L814 Other melanin hyperpigmentation: Secondary | ICD-10-CM | POA: Diagnosis not present

## 2016-06-08 DIAGNOSIS — L908 Other atrophic disorders of skin: Secondary | ICD-10-CM | POA: Diagnosis not present

## 2016-06-17 ENCOUNTER — Telehealth: Payer: Self-pay

## 2016-06-17 MED ORDER — OSELTAMIVIR PHOSPHATE 75 MG PO CAPS: 75.0000 mg | ORAL_CAPSULE | Freq: Every day | ORAL | 0 refills | Status: DC

## 2016-06-17 NOTE — Telephone Encounter (Signed)
Per DPR left detailed voicemail letting pt know Dr. Marliss Coots comments

## 2016-06-17 NOTE — Telephone Encounter (Signed)
I sent it electronically  Wash hands/wipe surfaces and wear a mask to prevent the flu also Take as directed

## 2016-06-17 NOTE — Telephone Encounter (Signed)
Pt left note at front desk that pts wife tested positive for flu today at [facility] facility. Pt wants tamiflu to [facility] [address]. I was unable to speak with pt for further info.

## 2016-07-12 ENCOUNTER — Other Ambulatory Visit: Payer: Self-pay | Admitting: Family Medicine

## 2016-09-08 DIAGNOSIS — Z08 Encounter for follow-up examination after completed treatment for malignant neoplasm: Secondary | ICD-10-CM | POA: Diagnosis not present

## 2016-09-08 DIAGNOSIS — Z85828 Personal history of other malignant neoplasm of skin: Secondary | ICD-10-CM | POA: Diagnosis not present

## 2016-09-08 DIAGNOSIS — X32XXXA Exposure to sunlight, initial encounter: Secondary | ICD-10-CM | POA: Diagnosis not present

## 2016-09-08 DIAGNOSIS — L570 Actinic keratosis: Secondary | ICD-10-CM | POA: Diagnosis not present

## 2016-09-08 DIAGNOSIS — L821 Other seborrheic keratosis: Secondary | ICD-10-CM | POA: Diagnosis not present

## 2016-09-08 DIAGNOSIS — D485 Neoplasm of uncertain behavior of skin: Secondary | ICD-10-CM | POA: Diagnosis not present

## 2016-09-08 DIAGNOSIS — D044 Carcinoma in situ of skin of scalp and neck: Secondary | ICD-10-CM | POA: Diagnosis not present

## 2016-10-10 ENCOUNTER — Other Ambulatory Visit: Payer: Self-pay | Admitting: Family Medicine

## 2016-10-10 NOTE — Telephone Encounter (Signed)
Received refill request electronically Last refill 07/12/16 #90 Last office visit 11/09/15 See allergy/contraindication

## 2016-10-10 NOTE — Telephone Encounter (Signed)
Please schedule f/u after June with labs prior and refill until then

## 2016-10-11 NOTE — Telephone Encounter (Signed)
Left detailed message on cell phone (DPR)  for patient to call and schedule appointment and refills will be sent in per Dr. Glori Bickers.

## 2016-10-14 DIAGNOSIS — D044 Carcinoma in situ of skin of scalp and neck: Secondary | ICD-10-CM | POA: Diagnosis not present

## 2016-10-17 MED ORDER — TELMISARTAN 80 MG PO TABS: 80.0000 mg | ORAL_TABLET | Freq: Every day | ORAL | 0 refills | Status: DC

## 2016-10-17 NOTE — Addendum Note (Signed)
Addended by: Tammi Sou on: 10/17/2016 04:17 PM   Modules accepted: Orders

## 2016-10-17 NOTE — Telephone Encounter (Signed)
Pt scheduled appts, med filled

## 2016-11-08 ENCOUNTER — Telehealth: Payer: Self-pay | Admitting: Family Medicine

## 2016-11-08 DIAGNOSIS — Z125 Encounter for screening for malignant neoplasm of prostate: Secondary | ICD-10-CM

## 2016-11-08 DIAGNOSIS — E11319 Type 2 diabetes mellitus with unspecified diabetic retinopathy without macular edema: Secondary | ICD-10-CM

## 2016-11-08 DIAGNOSIS — Z0000 Encounter for general adult medical examination without abnormal findings: Secondary | ICD-10-CM

## 2016-11-08 NOTE — Telephone Encounter (Signed)
-----   Message from Eustace Pen, LPN sent at 06/17/4172  4:54 PM EDT ----- Regarding: Labs 6/13 Please place lab orders.  [employer]

## 2016-11-09 ENCOUNTER — Ambulatory Visit: Payer: Medicare HMO

## 2016-11-09 ENCOUNTER — Telehealth: Payer: Self-pay | Admitting: Family Medicine

## 2016-11-09 NOTE — Telephone Encounter (Signed)
Patient did not come in for their appointment today for med wellness. Please let me know if patient needs to be contacted immediately for follow up or no follow up needed. Do you want to charge the NSF?

## 2016-11-10 ENCOUNTER — Other Ambulatory Visit (INDEPENDENT_AMBULATORY_CARE_PROVIDER_SITE_OTHER): Payer: Medicare HMO

## 2016-11-10 DIAGNOSIS — Z0000 Encounter for general adult medical examination without abnormal findings: Secondary | ICD-10-CM

## 2016-11-10 DIAGNOSIS — E11319 Type 2 diabetes mellitus with unspecified diabetic retinopathy without macular edema: Secondary | ICD-10-CM | POA: Diagnosis not present

## 2016-11-10 DIAGNOSIS — Z125 Encounter for screening for malignant neoplasm of prostate: Secondary | ICD-10-CM | POA: Diagnosis not present

## 2016-11-10 LAB — PSA, MEDICARE: PSA: 0.79 ng/mL (ref 0.10–4.00)

## 2016-11-10 LAB — CBC WITH DIFFERENTIAL/PLATELET
BASOS ABS: 0 10*3/uL (ref 0.0–0.1)
Basophils Relative: 0.9 % (ref 0.0–3.0)
EOS ABS: 0.1 10*3/uL (ref 0.0–0.7)
Eosinophils Relative: 1.8 % (ref 0.0–5.0)
HEMATOCRIT: 36 % — AB (ref 39.0–52.0)
HEMOGLOBIN: 12.7 g/dL — AB (ref 13.0–17.0)
LYMPHS PCT: 22.4 % (ref 12.0–46.0)
Lymphs Abs: 1 10*3/uL (ref 0.7–4.0)
MCHC: 35.4 g/dL (ref 30.0–36.0)
MCV: 90.7 fl (ref 78.0–100.0)
Monocytes Absolute: 0.4 10*3/uL (ref 0.1–1.0)
Monocytes Relative: 8.5 % (ref 3.0–12.0)
Neutro Abs: 3.1 10*3/uL (ref 1.4–7.7)
Neutrophils Relative %: 66.4 % (ref 43.0–77.0)
Platelets: 204 10*3/uL (ref 150.0–400.0)
RBC: 3.97 Mil/uL — AB (ref 4.22–5.81)
RDW: 13.6 % (ref 11.5–15.5)
WBC: 4.6 10*3/uL (ref 4.0–10.5)

## 2016-11-10 LAB — LIPID PANEL
CHOLESTEROL: 117 mg/dL (ref 0–200)
HDL: 32.2 mg/dL — ABNORMAL LOW (ref >39.00)
LDL CALC: 46 mg/dL (ref 0–99)
NONHDL: 85
Total CHOL/HDL Ratio: 4
Triglycerides: 196 mg/dL — ABNORMAL HIGH (ref 0.0–149.0)
VLDL: 39.2 mg/dL (ref 0.0–40.0)

## 2016-11-10 LAB — COMPREHENSIVE METABOLIC PANEL
ALBUMIN: 4.1 g/dL (ref 3.5–5.2)
ALT: 26 U/L (ref 0–53)
AST: 20 U/L (ref 0–37)
Alkaline Phosphatase: 61 U/L (ref 39–117)
BILIRUBIN TOTAL: 0.5 mg/dL (ref 0.2–1.2)
BUN: 16 mg/dL (ref 6–23)
CALCIUM: 9.2 mg/dL (ref 8.4–10.5)
CO2: 25 mEq/L (ref 19–32)
CREATININE: 0.89 mg/dL (ref 0.40–1.50)
Chloride: 105 mEq/L (ref 96–112)
GFR: 88.74 mL/min (ref >60.00)
Glucose, Bld: 148 mg/dL — ABNORMAL HIGH (ref 70–99)
Potassium: 4.4 mEq/L (ref 3.5–5.1)
Sodium: 139 mEq/L (ref 135–145)
Total Protein: 6.3 g/dL (ref 6.0–8.3)

## 2016-11-10 LAB — HEMOGLOBIN A1C: HEMOGLOBIN A1C: 8.6 % — AB (ref 4.6–6.5)

## 2016-11-10 LAB — TSH: TSH: 1.64 u[IU]/mL (ref 0.35–4.50)

## 2016-11-14 ENCOUNTER — Ambulatory Visit: Payer: Medicare HMO

## 2016-11-14 NOTE — Telephone Encounter (Signed)
Pt missed appt due to travelling. AWV rescheduled. No NSF will be charged.

## 2016-11-15 ENCOUNTER — Encounter: Payer: Self-pay | Admitting: Family Medicine

## 2016-11-15 ENCOUNTER — Ambulatory Visit (INDEPENDENT_AMBULATORY_CARE_PROVIDER_SITE_OTHER): Payer: Medicare HMO

## 2016-11-15 ENCOUNTER — Ambulatory Visit (INDEPENDENT_AMBULATORY_CARE_PROVIDER_SITE_OTHER): Payer: Medicare HMO | Admitting: Family Medicine

## 2016-11-15 VITALS — BP 112/68 | HR 70 | Temp 98.5°F | Ht 69.75 in | Wt 245.2 lb

## 2016-11-15 DIAGNOSIS — M3130 Wegener's granulomatosis without renal involvement: Secondary | ICD-10-CM | POA: Diagnosis not present

## 2016-11-15 DIAGNOSIS — M8580 Other specified disorders of bone density and structure, unspecified site: Secondary | ICD-10-CM | POA: Diagnosis not present

## 2016-11-15 DIAGNOSIS — Z6835 Body mass index (BMI) 35.0-35.9, adult: Secondary | ICD-10-CM

## 2016-11-15 DIAGNOSIS — I10 Essential (primary) hypertension: Secondary | ICD-10-CM

## 2016-11-15 DIAGNOSIS — Z7952 Long term (current) use of systemic steroids: Secondary | ICD-10-CM | POA: Insufficient documentation

## 2016-11-15 DIAGNOSIS — E7800 Pure hypercholesterolemia, unspecified: Secondary | ICD-10-CM | POA: Diagnosis not present

## 2016-11-15 DIAGNOSIS — Z125 Encounter for screening for malignant neoplasm of prostate: Secondary | ICD-10-CM

## 2016-11-15 DIAGNOSIS — Z0000 Encounter for general adult medical examination without abnormal findings: Secondary | ICD-10-CM

## 2016-11-15 DIAGNOSIS — Z1211 Encounter for screening for malignant neoplasm of colon: Secondary | ICD-10-CM

## 2016-11-15 DIAGNOSIS — E11319 Type 2 diabetes mellitus with unspecified diabetic retinopathy without macular edema: Secondary | ICD-10-CM | POA: Diagnosis not present

## 2016-11-15 MED ORDER — AMLODIPINE BESYLATE 10 MG PO TABS: 10.0000 mg | ORAL_TABLET | Freq: Every day | ORAL | 3 refills | Status: DC

## 2016-11-15 MED ORDER — ATORVASTATIN CALCIUM 40 MG PO TABS: 40.0000 mg | ORAL_TABLET | Freq: Every day | ORAL | 3 refills | Status: DC

## 2016-11-15 NOTE — Progress Notes (Signed)
   Subjective:    Patient ID: Ryan Morgan, male    DOB: 1941/04/28, 76 y.o.   MRN: [id number]  HPI Here for health maintenance exam and to review chronic medical problems    Very busy with traveling and graduations   For AMW after this appt today   Wt Readings from Last 3 Encounters:  11/15/16 245 lb 4 oz (111.2 kg)  12/23/15 247 lb 9.6 oz (112.3 kg)  12/18/15 244 lb 8 oz (110.9 kg)  wt is pretty stable  Not working on wt loss-not motivated  bmi 35.4  Eye exam 6/17-vision is good   Flu shot fall   Tetanus shot 1/10  Colonoscopy 3/16-pt reports -one tiny polyp  Will be open to cologuard later   dexa 10/14 normal  No falls or fractures  He takes mvi  He is interested in another dexa   Zoster status may be interested in shingrix later when available   Hx of Wegener's granulomatosis  -sees pulm Thinks his sob is worse with time (much worse with pollen or change in air temp) Has not needed prednisone    bp is stable today  Hx of hypertension in setting of CAD No cp or palpitations or headaches or edema  No side effects to medicines  BP Readings from Last 3 Encounters:  11/15/16 112/68  12/23/15 122/60  12/18/15 118/64      DM2 Lab Results  Component Value Date   HGBA1C 8.6 (H) 11/10/2016  was 8.7  This is stable  Steroids -not that often  Obese  He avoids sweets but eats too much carbohydrate (incl corn and potato)  He has done DM teaching-does not think it is reasonable Not motivated  Caring for his wife so not taking care of himself  Declines more medication Declines endo visit (has done it before) Wants to watch diet    Hx of hyperlipidemia  Lab Results  Component Value Date   CHOL 117 11/10/2016   CHOL 130 10/09/2015   CHOL 134 06/02/2014   Lab Results  Component Value Date   HDL 32.20 (L) 11/10/2016   HDL 30.40 (L) 10/09/2015   HDL 36.70 (L) 06/02/2014   Lab Results  Component Value Date   LDLCALC 46 11/10/2016   LDLCALC 68  06/02/2014   LDLCALC 57 02/26/2013   Lab Results  Component Value Date   TRIG 196.0 (H) 11/10/2016   TRIG 211.0 (H) 10/09/2015   TRIG 148.0 06/02/2014   Lab Results  Component Value Date   CHOLHDL 4 11/10/2016   CHOLHDL 4 10/09/2015   CHOLHDL 4 06/02/2014   Lab Results  Component Value Date   LDLDIRECT 69.0 10/09/2015   LDLDIRECT 65.7 08/13/2012   atorvastatin and diet  HDL is chronically low  Exercise- remodeling a house and also walks  Too much BBQ   Prostate cancer screen No trouble urinating - sometimes more frequency than others  No family hx of prostate cancer  Nocturia - 0-1 times (usually not)  Lab Results  Component Value Date   PSA 0.79 11/10/2016   PSA 0.69 10/09/2015   PSA 0.83 06/02/2014    Review of Systems     Objective:   Physical Exam        Assessment & Plan:

## 2016-11-15 NOTE — Progress Notes (Signed)
Pre visit review using our clinic review tool, if applicable. No additional management support is needed unless otherwise documented below in the visit note. 

## 2016-11-15 NOTE — Patient Instructions (Signed)
[name] , Thank you for taking time to come for your Medicare Wellness Visit. I appreciate your ongoing commitment to your health goals. Please review the following plan we discussed and let me know if I can assist you in the future.   These are the goals we discussed: Goals    . Increase physical activity          When weather permits, I will continue to walk at least 2-3 hours daily.        This is a list of the screening recommended for you and due dates:  Health Maintenance  Topic Date Due  . Eye exam for diabetics  11/23/2016  . Flu Shot  12/28/2016  . Hemoglobin A1C  05/12/2017  . Complete foot exam   11/15/2017  . Tetanus Vaccine  05/30/2018  . Colon Cancer Screening  07/29/2024  . Pneumonia vaccines  Completed   Preventive Care for Adults  A healthy lifestyle and preventive care can promote health and wellness. Preventive health guidelines for adults include the following key practices.  . A routine yearly physical is a good way to check with your health care provider about your health and preventive screening. It is a chance to share any concerns and updates on your health and to receive a thorough exam.  . Visit your dentist for a routine exam and preventive care every 6 months. Brush your teeth twice a day and floss once a day. Good oral hygiene prevents tooth decay and gum disease.  . The frequency of eye exams is based on your age, health, family medical history, use  of contact lenses, and other factors. Follow your health care provider's ecommendations for frequency of eye exams.  . Eat a healthy diet. Foods like vegetables, fruits, whole grains, low-fat dairy products, and lean protein foods contain the nutrients you need without too many calories. Decrease your intake of foods high in solid fats, added sugars, and salt. Eat the right amount of calories for you. Get information about a proper diet from your health care provider, if necessary.  . Regular physical  exercise is one of the most important things you can do for your health. Most adults should get at least 150 minutes of moderate-intensity exercise (any activity that increases your heart rate and causes you to sweat) each week. In addition, most adults need muscle-strengthening exercises on 2 or more days a week.  Silver Sneakers may be a benefit available to you. To determine eligibility, you may visit the website: www.silversneakers.com or contact program at 905-860-0637 Mon-Fri between 8AM-8PM.   . Maintain a healthy weight. The body mass index (BMI) is a screening tool to identify possible weight problems. It provides an estimate of body fat based on height and weight. Your health care provider can find your BMI and can help you achieve or maintain a healthy weight.   For adults 20 years and older: ? A BMI below 18.5 is considered underweight. ? A BMI of 18.5 to 24.9 is normal. ? A BMI of 25 to 29.9 is considered overweight. ? A BMI of 30 and above is considered obese.   . Maintain normal blood lipids and cholesterol levels by exercising and minimizing your intake of saturated fat. Eat a balanced diet with plenty of fruit and vegetables. Blood tests for lipids and cholesterol should begin at age 85 and be repeated every 5 years. If your lipid or cholesterol levels are high, you are over 50, or you are at  high risk for heart disease, you may need your cholesterol levels checked more frequently. Ongoing high lipid and cholesterol levels should be treated with medicines if diet and exercise are not working.  . If you smoke, find out from your health care provider how to quit. If you do not use tobacco, please do not start.  . If you choose to drink alcohol, please do not consume more than 2 drinks per day. One drink is considered to be 12 ounces (355 mL) of beer, 5 ounces (148 mL) of wine, or 1.5 ounces (44 mL) of liquor.  . If you are 20-58 years old, ask your health care provider if you  should take aspirin to prevent strokes.  . Use sunscreen. Apply sunscreen liberally and repeatedly throughout the day. You should seek shade when your shadow is shorter than you. Protect yourself by wearing long sleeves, pants, a wide-brimmed hat, and sunglasses year round, whenever you are outdoors.  . Once a month, do a whole body skin exam, using a mirror to look at the skin on your back. Tell your health care provider of new moles, moles that have irregular borders, moles that are larger than a pencil eraser, or moles that have changed in shape or color.

## 2016-11-15 NOTE — Progress Notes (Signed)
Subjective:   Ryan Morgan is a 76 y.o. male who presents for Medicare Annual/Subsequent preventive examination.  Review of Systems:  N/A Cardiac Risk Factors include: advanced age (>12men, >45 women);obesity (BMI >30kg/m2);diabetes mellitus;dyslipidemia;hypertension     Objective:    Vitals: BP 112/68 (BP Location: Left Arm, Patient Position: Sitting, Cuff Size: Large)   Pulse 70   Temp 98.5 F (36.9 C) (Oral)   Ht 5' 9.75" (1.772 m) Comment: no shoes  Wt 245 lb 4 oz (111.2 kg)   SpO2 95%   BMI 35.44 kg/m   Body mass index is 35.44 kg/m.  Tobacco History  Smoking Status  . Former Smoker  . Packs/day: 0.50  . Years: 20.00  . Types: Cigarettes  . Quit date: 05/30/1978  Smokeless Tobacco  . Never Used     Counseling given: No   Past Medical History:  Diagnosis Date  . Coronary artery disease   . Diabetes mellitus   . Heart disease   . Hyperlipidemia   . Hypertension   . Wegener's granulomatosis ([place]) 2007   Past Surgical History:  Procedure Laterality Date  . BASAL CELL CARCINOMA EXCISION     removed from face x 3  . CARDIAC CATHETERIZATION    . CARDIAC SURGERY  2000   stent in heart  . CORONARY ANGIOPLASTY  09/22/1998   s/p stent placement; [facility] 3.0 x 18 mm Ref [id number]  . 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 Cancer Mother        tumor on tonsil  . Stroke Maternal Grandfather    History  Sexual Activity  . Sexual activity: No    Outpatient Encounter Prescriptions as of 11/15/2016  Medication Sig  . amLODipine (NORVASC) 10 MG tablet Take 1 tablet (10 mg total) by mouth daily.  Marland Kitchen aspirin 81 MG tablet Take 81 mg by mouth daily.   Marland Kitchen atorvastatin (LIPITOR) 40 MG tablet Take 1 tablet (40 mg total) by mouth daily.  . cetirizine (ZYRTEC) 10 MG tablet Take 10 mg by mouth 2 (two) times daily.  . clobetasol cream (TEMOVATE) 4.54 % Apply 1 application topically 2 (two) times  daily. To affected areas (Patient taking differently: Apply 1 application topically 2 (two) times daily as needed. To affected areas)  . glimepiride (AMARYL) 4 MG tablet TAKE 2 TABLETS (8 MG TOTAL) BY MOUTH DAILY BEFORE BREAKFAST.  Marland Kitchen glucose blood (ONE TOUCH ULTRA TEST) test strip Check blood sugar once daily and as directed Dx E11.319  . hydrOXYzine (ATARAX/VISTARIL) 25 MG tablet TAKE ONE TABLET BY MOUTH THREE TIMES DAILY AS NEEDED FOR ITCHING (CAUTION OF SEDATION)  . LANCETS ULTRA THIN 30G MISC by Does not apply route. One Touch  . metFORMIN (GLUCOPHAGE) 1000 MG tablet TAKE 1 TABLET (1,000 MG TOTAL) BY MOUTH 2 (TWO) TIMES DAILY.  . Multiple Vitamin (MULTIVITAMIN) capsule Take 1 capsule by mouth daily.  Marland Kitchen nystatin cream (MYCOSTATIN) Apply 1 application topically 2 (two) times daily as needed for dry skin.  . predniSONE (DELTASONE) 10 MG tablet Take 1 tablet (10 mg total) by mouth as needed.  Marland Kitchen spironolactone (ALDACTONE) 25 MG tablet Take 1 tablet (25 mg total) by mouth daily.  Marland Kitchen telmisartan (MICARDIS) 80 MG tablet Take 1 tablet (80 mg total) by mouth daily.  . Triamcinolone Acetonide (NASACORT ALLERGY 24HR NA) Place 2 sprays into the nose daily as needed.    No facility-administered encounter  medications on file as of 11/15/2016.     Activities of Daily Living In your present state of health, do you have any difficulty performing the following activities: 11/15/2016  Hearing? Y  Vision? N  Difficulty concentrating or making decisions? N  Walking or climbing stairs? N  Dressing or bathing? N  Doing errands, shopping? N  Preparing Food and eating ? N  Using the Toilet? N  In the past six months, have you accidently leaked urine? N  Do you have problems with loss of bowel control? N  Managing your Medications? N  Managing your Finances? N  Housekeeping or managing your Housekeeping? N  Some recent data might be hidden    Patient Care Team: Tower, Wynelle Fanny, MD as PCP - General (Family  Medicine) Rockey Situ, Kathlene November, MD as Consulting Physician (Cardiology) Oneta Rack, MD as Consulting Physician (Dermatology) Juanito Doom, MD as Consulting Physician (Pulmonary Disease)   Assessment:     Hearing Screening   125Hz  250Hz  500Hz  1000Hz  2000Hz  3000Hz  4000Hz  6000Hz  8000Hz   Right ear:   40 40 40  0    Left ear:   0 0 40  0    Vision Screening Comments: Last vision exam in 2017 @ [facility]   Exercise Activities and Dietary recommendations Current Exercise Habits: Home exercise routine, Type of exercise: walking, Time (Minutes): > 60, Frequency (Times/Week): 7, Weekly Exercise (Minutes/Week): 0, Intensity: Mild, Exercise limited by: None identified  Goals    . Increase physical activity          When weather permits, I will continue to walk at least 2-3 hours daily.       Fall Risk Fall Risk  11/15/2016 10/09/2015 06/09/2014 03/05/2013  Falls in the past year? No No No No   Depression Screen PHQ 2/9 Scores 11/15/2016 10/09/2015 06/09/2014 03/05/2013  PHQ - 2 Score 0 0 0 0    Cognitive Function MMSE - Mini Mental State Exam 11/15/2016 10/09/2015  Orientation to time 5 5  Orientation to Place 5 5  Registration 3 3  Attention/ Calculation 0 0  Recall 3 3  Language- name 2 objects 0 0  Language- repeat 1 1  Language- follow 3 step command 3 3  Language- read & follow direction 0 0  Write a sentence 0 0  Copy design 0 0  Total score 20 20       PLEASE NOTE: A Mini-Cog screen was completed. Maximum score is 20. A value of 0 denotes this part of Folstein MMSE was not completed or the patient failed this part of the Mini-Cog screening.   Mini-Cog Screening Orientation to Time - Max 5 pts Orientation to Place - Max 5 pts Registration - Max 3 pts Recall - Max 3 pts Language Repeat - Max 1 pts Language Follow 3 Step Command - Max 3 pts   Immunization History  Administered Date(s) Administered  . Influenza Split 05/16/2011, 02/16/2012  .  Influenza,inj,Quad PF,36+ Mos 03/05/2013, 03/10/2014, 05/20/2015, 03/30/2016  . Pneumococcal Conjugate-13 06/09/2014  . Pneumococcal Polysaccharide-23 07/15/2011   Screening Tests Health Maintenance  Topic Date Due  . OPHTHALMOLOGY EXAM  11/23/2016  . INFLUENZA VACCINE  12/28/2016  . HEMOGLOBIN A1C  05/12/2017  . FOOT EXAM  11/15/2017  . TETANUS/TDAP  05/30/2018  . COLONOSCOPY  07/29/2024  . PNA vac Low Risk Adult  Completed      Plan:     I have personally reviewed and addressed the Medicare Annual Wellness  questionnaire and have noted the following in the patient's chart:  A. Medical and social history B. Use of alcohol, tobacco or illicit drugs  C. Current medications and supplements D. Functional ability and status E.  Nutritional status F.  Physical activity G. Advance directives H. List of other physicians I.  Hospitalizations, surgeries, and ER visits in previous 12 months J.  [place] to include hearing, vision, cognitive, depression L. Referrals and appointments - none  In addition, I have reviewed and discussed with patient certain preventive protocols, quality metrics, and best practice recommendations. A written personalized care plan for preventive services as well as general preventive health recommendations were provided to patient.  See attached scanned questionnaire for additional information.   Signed,   Lindell Noe, MHA, BS, LPN Health Coach

## 2016-11-15 NOTE — Assessment & Plan Note (Signed)
Discussed how this problem influences overall health and the risks it imposes  Reviewed plan for weight loss with lower calorie diet (via better food choices and also portion control or program like weight watchers) and exercise building up to or more than 30 minutes 5 days per week including some aerobic activity    

## 2016-11-15 NOTE — Assessment & Plan Note (Signed)
Lab Results  Component Value Date   PSA 0.79 11/10/2016   PSA 0.69 10/09/2015   PSA 0.83 06/02/2014   No fam hx No change in voiding

## 2016-11-15 NOTE — Assessment & Plan Note (Signed)
Lab Results  Component Value Date   HGBA1C 8.6 (H) 11/10/2016   He declines more medication  Will work on diet/exercise and wt loss  Also declines endo consult F/u in 3 mo to re assess Disc foot and eye care

## 2016-11-15 NOTE — Assessment & Plan Note (Signed)
On and off for Wegener's  dexa ordered  Disc blood sugar control

## 2016-11-15 NOTE — Assessment & Plan Note (Signed)
Reviewed health habits including diet and exercise and skin cancer prevention Reviewed appropriate screening tests for age  Also reviewed health mt list, fam hx and immunization status , as well as social and family history   See HPI For AMW later today  Enc wt loss Ref for dexa  Disc upcoming appts  Labs reviewed

## 2016-11-15 NOTE — Assessment & Plan Note (Signed)
Per pt colonoscopy 3/16 Will plan cologaurd in 2019

## 2016-11-15 NOTE — Assessment & Plan Note (Signed)
Per pt doing better Seldom uses prednisone Due for pulm f/u in aug

## 2016-11-15 NOTE — Progress Notes (Signed)
PCP notes:   Health maintenance:  Foot exam - completed by PCP  Abnormal screenings:   Hearing - failed  Patient concerns:   None  Nurse concerns:  None  Next PCP appt:   N/A; CPE prior to AWV  I reviewed health advisor's note, was available for consultation, and agree with documentation and plan. Loura Pardon MD

## 2016-11-15 NOTE — Assessment & Plan Note (Signed)
Disc goals for lipids and reasons to control them Rev labs with pt Rev low sat fat diet in detail  Baseline low HDL- rec exercise  Trig up due to DM LDL is at goal

## 2016-11-15 NOTE — Assessment & Plan Note (Signed)
Ref for dexa Is on and off prednisone long term No falls or fx Disc ca and D  Enc exercise

## 2016-11-15 NOTE — Assessment & Plan Note (Signed)
bp in fair control at this time  BP Readings from Last 1 Encounters:  11/15/16 112/68   No changes needed Disc lifstyle change with low sodium diet and exercise  Labs rev  Renewed amlodipine Wt loss enc

## 2016-11-15 NOTE — Patient Instructions (Addendum)
You have a cardiology appt in July  You need to schedule a pulmonary appt in July -don't forget to schedule that  Eye exam is due this month-do not forget to schedule that   Try to get 1200-1500 mg of calcium per day with at least 1000 iu of vitamin D - for bone health   Schedule bone density test on the way out   Get back to a diabetic diet  Work hard on weight loss Follow up in 3 months

## 2016-12-12 ENCOUNTER — Ambulatory Visit (INDEPENDENT_AMBULATORY_CARE_PROVIDER_SITE_OTHER)
Admission: RE | Admit: 2016-12-12 | Discharge: 2016-12-12 | Disposition: A | Discharge status: Home / Self Care | Payer: Medicare HMO | Source: Ambulatory Visit | Attending: Family Medicine | Admitting: Family Medicine

## 2016-12-12 DIAGNOSIS — M8580 Other specified disorders of bone density and structure, unspecified site: Secondary | ICD-10-CM | POA: Diagnosis not present

## 2016-12-12 DIAGNOSIS — Z7952 Long term (current) use of systemic steroids: Secondary | ICD-10-CM

## 2016-12-20 NOTE — Progress Notes (Signed)
Patient ID: Ryan Morgan, male   DOB: April 12, 1941, 76 y.o.   MRN: [id number] Cardiology Office Note  Date:  12/21/2016   ID:  Ryan Morgan, DOB [birth year], MRN [id number]  PCP:  Abner Greenspan, MD   Chief Complaint  Patient presents with  . other     12 month follow up. Meds reviewed by the pt. verbally. Pt. c/o shortness of breath and has chest pressure mostly when it's really cold outside but otherwise he states, "doing well."     HPI:  Ryan Morgan is a very pleasant 76 year old gentleman with a history of  DM,  smoking hx,  coronary artery disease,  stent placed to his LAD in 2000,  repeat catheterization in January 2003 with 40% LAD, 50% diagonal disease,  normal ejection fraction who presents for follow-up of his coronary artery disease.  He has a long history of Wegener's granulomatosis, diabetes. Prior thoracotomy surgery on the left for lung nodule, chronic pain on the left that is episodic, stable over several years chronic prednisone for Wegener's Chronic shortness of breath, sedentary lifestyle, obesity/deconditioning  who presents for follow up of his CAD  In follow-up today he reports that in general he is doing well He wonders if his wegeners is acting up ? More coughing. Has small prednisone, took a pill today Used to see Dr. 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  Lad stent 3.0 x 18 in 2000   PMH:   has a past medical history of Coronary artery disease; Diabetes mellitus; Heart disease; Hyperlipidemia; Hypertension; and Wegener's granulomatosis ([place]) (2007).  PSH:    Past Surgical History:  Procedure Laterality Date  . BASAL CELL CARCINOMA EXCISION     removed from face x 3  . CARDIAC CATHETERIZATION    . CARDIAC SURGERY  2000   stent in heart  . CORONARY ANGIOPLASTY  09/22/1998   s/p stent placement; [facility] 3.0 x 18 mm Ref [id number]  . LUNG SURGERY     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    Current Outpatient Prescriptions  Medication Sig Dispense Refill  . amLODipine (NORVASC) 10 MG tablet Take 1 tablet (10 mg total) by mouth daily. 90 tablet 3  . aspirin 81 MG tablet Take 81 mg by mouth daily.     Marland Kitchen atorvastatin (LIPITOR) 40 MG tablet Take 1 tablet (40 mg total) by mouth daily. 90 tablet 3  . cetirizine (ZYRTEC) 10 MG tablet Take 10 mg by mouth 2 (two) times daily.    . cholecalciferol (VITAMIN D) 1000 units tablet Take 1,000 Units by mouth daily.    . clobetasol cream (TEMOVATE) 1.30 % Apply 1 application topically 2 (two) times daily. To affected areas (  Patient taking differently: Apply 1 application topically 2 (two) times daily as needed. To affected areas) 30 g 1  . glimepiride (AMARYL) 4 MG tablet TAKE 2 TABLETS (8 MG TOTAL) BY MOUTH DAILY BEFORE BREAKFAST. 180 tablet 3  . glucose blood (ONE TOUCH ULTRA TEST) test strip Check blood sugar once daily and as directed Dx E11.319 100 each 1  . hydrOXYzine (ATARAX/VISTARIL) 25 MG tablet TAKE ONE TABLET BY MOUTH THREE TIMES DAILY AS NEEDED FOR ITCHING (CAUTION OF SEDATION) 30 tablet 1  . LANCETS ULTRA THIN 30G MISC by Does not apply route. One Touch    . metFORMIN (GLUCOPHAGE)  1000 MG tablet TAKE 1 TABLET (1,000 MG TOTAL) BY MOUTH 2 (TWO) TIMES DAILY. 180 tablet 3  . Multiple Vitamin (MULTIVITAMIN) capsule Take 1 capsule by mouth daily.    Marland Kitchen nystatin cream (MYCOSTATIN) Apply 1 application topically 2 (two) times daily as needed for dry skin.    . predniSONE (DELTASONE) 10 MG tablet Take 1 tablet (10 mg total) by mouth as needed. 30 tablet 3  . spironolactone (ALDACTONE) 25 MG tablet Take 1 tablet (25 mg total) by mouth daily. 90 tablet 3  . telmisartan (MICARDIS) 80 MG tablet Take 1 tablet (80 mg total) by mouth daily. 90 tablet 0  . Triamcinolone Acetonide (NASACORT ALLERGY 24HR NA) Place 2 sprays into the nose daily as needed.      No current facility-administered medications for this visit.      Allergies:   Benicar hct [olmesartan medoxomil-hctz]; Bystolic [nebivolol hcl]; Niaspan [niacin er]; and Septra [bactrim]   Social History:  The patient  reports that he quit smoking about 38 years ago. His smoking use included Cigarettes. He has a 10.00 pack-year smoking history. He has never used smokeless tobacco. He reports that he does not drink alcohol or use drugs.   Family History:   family history includes Cancer in his mother; Stroke in his maternal grandfather.    Review of Systems: Review of Systems  Constitutional: Negative.   Respiratory: Positive for cough and shortness of breath.   Cardiovascular: Negative.   Gastrointestinal: Negative.   Musculoskeletal: Negative.   Neurological: Negative.   Psychiatric/Behavioral: Negative.   All other systems reviewed and are negative.    PHYSICAL EXAM: VS:  BP 112/66 (BP Location: Left Arm, Patient Position: Sitting, Cuff Size: Normal)   Pulse 79   Ht 5' 10.5" (1.791 m)   Wt 244 lb 4 oz (110.8 kg)   BMI 34.55 kg/m  , BMI Body mass index is 34.55 kg/m.  GEN: Well nourished, well developed, in no acute distress, obese  HEENT: normal  Neck: no JVD, carotid bruits, or masses Cardiac: RRR; no murmurs,  rubs, or gallops,no edema  Respiratory:  clear to auscultation bilaterally, normal work of breathing GI: soft, nontender, nondistended, + BS MS: no deformity or atrophy  Skin: warm and dry, no rash Neuro:  Strength and sensation are intact Psych: euthymic mood, full affect    Recent Labs: 11/10/2016: ALT 26; BUN 16; Creatinine, Ser 0.89; Hemoglobin 12.7; Platelets 204.0; Potassium 4.4; Sodium 139; TSH 1.64    Lipid Panel Lab Results  Component Value Date   CHOL 117 11/10/2016   HDL 32.20 (L) 11/10/2016   LDLCALC 46 11/10/2016   TRIG 196.0 (H) 11/10/2016      Wt Readings from Last 3 Encounters:  12/21/16 244 lb 4 oz (110.8 kg)  11/15/16 245 lb 4 oz (111.2 kg)  11/15/16 245 lb 4 oz (111.2 kg)  ASSESSMENT AND PLAN:  Atherosclerosis of native coronary artery of native heart without angina pectoris - Plan: EKG 12-Lead Currently with no symptoms of angina. No further workup at this time. Continue current medication regimen.  Essential hypertension Blood pressure is well controlled on today's visit. No changes made to the medications.  Hyperlipemia Cholesterol is at goal on the current lipid regimen. No changes to the medications were made.  Shortness of breath I suspect shortness of breath is secondary to obesity, deconditioning Unable to exclude component of Wegener's. He reports that he will call Dr. Lake Bells to reestablish care. Unable to exclude ischemia. Long discussion concerning various treatment options including nuclear stress testing and echocardiogram. He has declined both at this time as symptoms are sporadic. Recommended he call our office if symptoms get worse  Atypical chest pain He had symptoms on last clinic visit, denies having significant symptoms on today's visit  Morbid Obesity We have encouraged continued exercise, careful diet management in an effort to lose weight.  Type 2 diabetes mellitus with retinopathy without macular edema, without  long-term current use of insulin, unspecified laterality, unspecified retinopathy severity ([place]) We have encouraged continued exercise, careful diet management in an effort to lose weight. Steady climb in hemoglobin A1c over the past year or so Numbers discussed with him in detail  Disposition:   F/U  6 months   Total encounter time more than 25 minutes  Greater than 50% was spent in counseling and coordination of care with the patient    Orders Placed This Encounter  Procedures  . EKG 12-Lead     Signed, Esmond Plants, M.D., Ph.D. 12/21/2016  [place], [place]

## 2016-12-21 ENCOUNTER — Encounter: Payer: Self-pay | Admitting: Cardiovascular Disease

## 2016-12-21 ENCOUNTER — Ambulatory Visit (INDEPENDENT_AMBULATORY_CARE_PROVIDER_SITE_OTHER): Payer: Medicare HMO | Admitting: Cardiovascular Disease

## 2016-12-21 VITALS — BP 112/66 | HR 79 | Ht 70.5 in | Wt 244.2 lb

## 2016-12-21 DIAGNOSIS — E11319 Type 2 diabetes mellitus with unspecified diabetic retinopathy without macular edema: Secondary | ICD-10-CM

## 2016-12-21 DIAGNOSIS — E7800 Pure hypercholesterolemia, unspecified: Secondary | ICD-10-CM | POA: Diagnosis not present

## 2016-12-21 DIAGNOSIS — I10 Essential (primary) hypertension: Secondary | ICD-10-CM

## 2016-12-21 DIAGNOSIS — R0789 Other chest pain: Secondary | ICD-10-CM

## 2016-12-21 DIAGNOSIS — I25118 Atherosclerotic heart disease of native coronary artery with other forms of angina pectoris: Secondary | ICD-10-CM | POA: Diagnosis not present

## 2016-12-21 NOTE — Patient Instructions (Addendum)
Medication Instructions:   No medication changes made  Labwork:  No new labs needed  Testing/Procedures:  No further testing at this time  Please call if you would like: 1) nuclear stress test (myoview), hospital, 2 hours 2) echocardiogram, to look inside the heart to look for pressures   Follow-Up: It was a pleasure seeing you in the office today. Please call us if you have new issues that need to be addressed before your next appt.  [phone]  Your physician wants you to follow-up in: 6 months.  You will receive a reminder letter in the mail two months in advance. If you don't receive a letter, please call our office to schedule the follow-up appointment.  If you need a refill on your cardiac medications before your next appointment, please call your pharmacy.    Cardiac Nuclear Scan A cardiac nuclear scan is a test that measures blood flow to the heart when a person is resting and when he or she is exercising. The test looks for problems such as:  Not enough blood reaching a portion of the heart.  The heart muscle not working normally.  You may need this test if:  You have heart disease.  You have had abnormal lab results.  You have had heart surgery or angioplasty.  You have chest pain.  You have shortness of breath.  In this test, a radioactive dye (tracer) is injected into your bloodstream. After the tracer has traveled to your heart, an imaging device is used to measure how much of the tracer is absorbed by or distributed to various areas of your heart. This procedure is usually done at a hospital and takes 2-4 hours. Tell a health care provider about:  Any allergies you have.  All medicines you are taking, including vitamins, herbs, eye drops, creams, and over-the-counter medicines.  Any problems you or family members have had with the use of anesthetic medicines.  Any blood disorders you have.  Any surgeries you have had.  Any medical  conditions you have.  Whether you are pregnant or may be pregnant. What are the risks? Generally, this is a safe procedure. However, problems may occur, including:  Serious chest pain and heart attack. This is only a risk if the stress portion of the test is done.  Rapid heartbeat.  Sensation of warmth in your chest. This usually passes quickly.  What happens before the procedure?  Ask your health care provider about changing or stopping your regular medicines. This is especially important if you are taking diabetes medicines or blood thinners.  Remove your jewelry on the day of the procedure. What happens during the procedure?  An IV tube will be inserted into one of your veins.  Your health care provider will inject a small amount of radioactive tracer through the tube.  You will wait for 20-40 minutes while the tracer travels through your bloodstream.  Your heart activity will be monitored with an electrocardiogram (ECG).  You will lie down on an exam table.  Images of your heart will be taken for about 15-20 minutes.  You may be asked to exercise on a treadmill or stationary bike. While you exercise, your heart's activity will be monitored with an ECG, and your blood pressure will be checked. If you are unable to exercise, you may be given a medicine to increase blood flow to parts of your heart.  When blood flow to your heart has peaked, a tracer will again be injected through the IV  tube.  After 20-40 minutes, you will get back on the exam table and have more images taken of your heart.  When the procedure is over, your IV tube will be removed. The procedure may vary among health care providers and hospitals. Depending on the type of tracer used, scans may need to be repeated 3-4 hours later. What happens after the procedure?  Unless your health care provider tells you otherwise, you may return to your normal schedule, including diet, activities, and  medicines.  Unless your health care provider tells you otherwise, you may increase your fluid intake. This will help flush the contrast dye from your body. Drink enough fluid to keep your urine clear or pale yellow.  It is up to you to get your test results. Ask your health care provider, or the department that is doing the test, when your results will be ready. Summary  A cardiac nuclear scan measures the blood flow to the heart when a person is resting and when he or she is exercising.  You may need this test if you are at risk for heart disease.  Tell your health care provider if you are pregnant.  Unless your health care provider tells you otherwise, increase your fluid intake. This will help flush the contrast dye from your body. Drink enough fluid to keep your urine clear or pale yellow. This information is not intended to replace advice given to you by your health care provider. Make sure you discuss any questions you have with your health care provider. Document Released: 06/10/2004 Document Revised: 05/18/2016 Document Reviewed: 04/24/2013 Elsevier Interactive Patient Education  2017 [place]. Echocardiogram An echocardiogram, or echocardiography, uses sound waves (ultrasound) to produce an image of your heart. The echocardiogram is simple, painless, obtained within a short period of time, and offers valuable information to your health care provider. The images from an echocardiogram can provide information such as:  Evidence of coronary artery disease (CAD).  Heart size.  Heart muscle function.  Heart valve function.  Aneurysm detection.  Evidence of a past heart attack.  Fluid buildup around the heart.  Heart muscle thickening.  Assess heart valve function.  Tell a health care provider about:  Any allergies you have.  All medicines you are taking, including vitamins, herbs, eye drops, creams, and over-the-counter medicines.  Any problems you or family  members have had with anesthetic medicines.  Any blood disorders you have.  Any surgeries you have had.  Any medical conditions you have.  Whether you are pregnant or may be pregnant. What happens before the procedure? No special preparation is needed. Eat and drink normally. What happens during the procedure?  In order to produce an image of your heart, gel will be applied to your chest and a wand-like tool (transducer) will be moved over your chest. The gel will help transmit the sound waves from the transducer. The sound waves will harmlessly bounce off your heart to allow the heart images to be captured in real-time motion. These images will then be recorded.  You may need an IV to receive a medicine that improves the quality of the pictures. What happens after the procedure? You may return to your normal schedule including diet, activities, and medicines, unless your health care provider tells you otherwise. This information is not intended to replace advice given to you by your health care provider. Make sure you discuss any questions you have with your health care provider. Document Released: 05/13/2000 Document Revised: 01/02/2016 Document  Reviewed: 01/21/2013 Elsevier Interactive Patient Education  2017 [employer].

## 2016-12-23 DIAGNOSIS — H2511 Age-related nuclear cataract, right eye: Secondary | ICD-10-CM | POA: Diagnosis not present

## 2016-12-23 LAB — HM DIABETES EYE EXAM

## 2016-12-29 ENCOUNTER — Other Ambulatory Visit: Payer: Self-pay | Admitting: Family Medicine

## 2016-12-30 ENCOUNTER — Ambulatory Visit (INDEPENDENT_AMBULATORY_CARE_PROVIDER_SITE_OTHER)
Admission: RE | Admit: 2016-12-30 | Discharge: 2016-12-30 | Disposition: A | Discharge status: Home / Self Care | Payer: Medicare HMO | Source: Ambulatory Visit | Attending: Pulmonary Disease | Admitting: Pulmonary Disease

## 2016-12-30 ENCOUNTER — Ambulatory Visit (INDEPENDENT_AMBULATORY_CARE_PROVIDER_SITE_OTHER): Payer: Medicare HMO | Admitting: Pulmonary Disease

## 2016-12-30 ENCOUNTER — Encounter: Payer: Self-pay | Admitting: Pulmonary Disease

## 2016-12-30 ENCOUNTER — Other Ambulatory Visit: Payer: Medicare HMO

## 2016-12-30 VITALS — BP 126/74 | HR 77 | Ht 70.5 in | Wt 247.0 lb

## 2016-12-30 DIAGNOSIS — R05 Cough: Secondary | ICD-10-CM

## 2016-12-30 DIAGNOSIS — R059 Cough, unspecified: Secondary | ICD-10-CM

## 2016-12-30 DIAGNOSIS — R0609 Other forms of dyspnea: Secondary | ICD-10-CM | POA: Diagnosis not present

## 2016-12-30 DIAGNOSIS — M3130 Wegener's granulomatosis without renal involvement: Secondary | ICD-10-CM

## 2016-12-30 DIAGNOSIS — K219 Gastro-esophageal reflux disease without esophagitis: Secondary | ICD-10-CM | POA: Diagnosis not present

## 2016-12-30 MED ORDER — PREDNISONE 10 MG PO TABS: 10.0000 mg | ORAL_TABLET | Freq: Every day | ORAL | 0 refills | Status: DC

## 2016-12-30 NOTE — Patient Instructions (Signed)
For your Wegener's granulomatosis: Take prednisone 10 mg for the next 3 days Let us know if your symptoms are not improving  For your cough with mucus production: Treat allergic rhinitis as follows: Use Neil Med rinses with distilled water at least twice per day using the instructions on the package. 1/2 hour after using the [facility] Med rinse, use Nasacort two puffs in each nostril once per day.  Remember that the Nasacort can take 1-2 weeks to work after regular use. Use generic zyrtec (cetirizine) every day.  If this doesn't help, then stop taking it and use chlorpheniramine-phenylephrine combination tablets.  For acid reflux: Follow the lifestyle modification sheet we gave you If this doesn't work then try taking Pepcid twice a day  For shortness of breath: We will check a chest x-ray today If your symptoms are not improving in the next 1-2 weeks with me know and we will arrange for a cardiac stress test  We will plan on seeing you back in about 4-6 weeks with either myself or one of the nurse practitioners to make sure symptoms are improving

## 2016-12-30 NOTE — Progress Notes (Signed)
Subjective:    Patient ID: Ryan Morgan, male    DOB: [birth year], 76 y.o.   MRN: [id number]  Synopsis: [name] was diagnosed with Wegener's granulomatosis in 1997 with joint pain, sinus disease. History with Cytoxan through 1999 for about 2 years total. Since then he had been treated off and on with prednisone. He states that he took prednisone nearly continuously for about 10 years. There is discrepancy in the notes, he feels that he did have significant respiratory symptoms related to Wegener's granulomatosis at 1 point and was treated with IV Solu-Medrol and hospital for this.  HPI Chief Complaint  Patient presents with  . Follow-up    last seen by BQ 02/2014- pt c/o increased prod cough with white/yellow mucus.     [name] and his wife are back to re-establish care with me for his Wegener's Granulomatosis.  He is doing OK.  He says that for the last month he has been coughing up more mucus lately.  He says that it was really bad when the pollen coutn was high.  He thinks that its coming from his sinuses.  He doesn't feel like it is coming from his lungs.  He says that tomsetimes it is coming from his throat.  Sometimes he has some dyspnea.  He has noted some chest pressure when he is breathing really cold air in the winter.  He hasn't experienced that same symptom in the warm weather. He says that he feels like his lungs are "sensitive" to strong fumes and weather changes.  He says his hknees, hands and wrists are flaring up more than normal.  He has some fluid in his right ear again.    He took prednisone 10mg .    He is taking zyrtec and allegra.  He uses nasacort 3-4 nights a month.   His wife says that he is more short of breath in the last month compared to before.  His weight has been stable.   Past Medical History:  Diagnosis Date  . Coronary artery disease   . Diabetes mellitus   . Heart disease   . Hyperlipidemia   . Hypertension   . Wegener's granulomatosis ([place]) 2007     Review of Systems  Constitutional: Negative for chills, fatigue and fever.  HENT: Positive for ear pain. Negative for congestion, postnasal drip and rhinorrhea.   Respiratory: Positive for cough. Negative for shortness of breath and wheezing.   Cardiovascular: Negative for chest pain, palpitations and leg swelling.  Musculoskeletal: Positive for arthralgias and joint swelling.       Objective:   Physical Exam  Vitals:   12/30/16 1612  BP: 126/74  Pulse: 77  SpO2: 97%  Weight: 247 lb (112 kg)  Height: 5' 10.5" (1.791 m)     Room air  Gen: well appearing HENT: OP clear, TM's clear, neck supple PULM: CTA B, normal percussion CV: RRR, no mgr, trace edema GI: BS+, soft, nontender Derm: no cyanosis or rash Psyche: normal mood and affect  Mary function test: July 2017 ratio normal, FVC 4.04 L 93% predicted, total lung capacity 7.01 L 99% protected, DLCO 30.50 mL 94% predicted     Assessment & Plan:   Cough - Plan: DG Chest 2 View, ANCA Screen Reflex Titer  Wegener's granulomatosis ([facility])  Gastroesophageal reflux disease, esophagitis presence not specified  Dyspnea on exertion  Discussion: He complains of worsening cough with mucus production over the last several days. Because this is associated with sinus congestion, wrist pain,  and knee pain which is consistent with his prior episodes of small vessel vasculitis flares and going to have him take prednisone for a few days to see if this will help. However, I think there is also postnasal drip and acid reflux at play. I've asked him to start an allergic rhinitis regimen and follow gastroesophageal reflux disease lifestyle modification. If that has not helped and he can try taking a Pepcid regularly.  In addition, he is noticing some increasing shortness of breath lately. We'll get a chest x-ray and O2 saturation while he walks today in clinic to make sure there is no signs of lung disease. Of note, lung function testing a  year ago was completely normal. See above. If chest x-ray is normal in oxygenation is normal and he still has the symptoms in 1-2 weeks after taking prednisone then I think he needs to have a cardiac stress test.  Plan: For your Wegener's granulomatosis: Take prednisone 10 mg for the next 3 days Let us know if your symptoms are not improving  For your cough with mucus production: Treat allergic rhinitis as follows: Use Neil Med rinses with distilled water at least twice per day using the instructions on the package. 1/2 hour after using the [facility] Med rinse, use Nasacort two puffs in each nostril once per day.  Remember that the Nasacort can take 1-2 weeks to work after regular use. Use generic zyrtec (cetirizine) every day.  If this doesn't help, then stop taking it and use chlorpheniramine-phenylephrine combination tablets.  For acid reflux: Follow the lifestyle modification sheet we gave you If this doesn't work then try taking Pepcid twice a day  For shortness of breath: We will check a chest x-ray today If your symptoms are not improving in the next 1-2 weeks with me know and we will arrange for a cardiac stress test  We will plan on seeing you back in about 4-6 weeks with either myself or one of the nurse practitioners to make sure symptoms are improving    Current Outpatient Prescriptions:  .  amLODipine (NORVASC) 10 MG tablet, Take 1 tablet (10 mg total) by mouth daily., Disp: 90 tablet, Rfl: 3 .  aspirin 81 MG tablet, Take 81 mg by mouth daily. , Disp: , Rfl:  .  atorvastatin (LIPITOR) 40 MG tablet, Take 1 tablet (40 mg total) by mouth daily., Disp: 90 tablet, Rfl: 3 .  calcium carbonate 1250 MG capsule, Take 1,250 mg by mouth daily., Disp: , Rfl:  .  cetirizine (ZYRTEC) 10 MG tablet, Take 10 mg by mouth 2 (two) times daily., Disp: , Rfl:  .  cholecalciferol (VITAMIN D) 1000 units tablet, Take 1,000 Units by mouth daily., Disp: , Rfl:  .  clobetasol cream (TEMOVATE) 3.38 %,  Apply 1 application topically 2 (two) times daily. To affected areas (Patient taking differently: Apply 1 application topically 2 (two) times daily as needed. To affected areas), Disp: 30 g, Rfl: 1 .  glimepiride (AMARYL) 4 MG tablet, TAKE 2 TABLETS (8 MG TOTAL) BY MOUTH DAILY BEFORE BREAKFAST., Disp: 180 tablet, Rfl: 3 .  glucose blood (ONE TOUCH ULTRA TEST) test strip, Check blood sugar once daily and as directed Dx E11.319, Disp: 100 each, Rfl: 1 .  hydrOXYzine (ATARAX/VISTARIL) 25 MG tablet, TAKE ONE TABLET BY MOUTH THREE TIMES DAILY AS NEEDED FOR ITCHING (CAUTION OF SEDATION), Disp: 30 tablet, Rfl: 1 .  LANCETS ULTRA THIN 30G MISC, by Does not apply route. One Touch, Disp: , Rfl:  .  metFORMIN (GLUCOPHAGE) 1000 MG tablet, TAKE ONE TABLET BY MOUTH TWICE DAILY, Disp: 180 tablet, Rfl: 1 .  Multiple Vitamin (MULTIVITAMIN) capsule, Take 1 capsule by mouth daily., Disp: , Rfl:  .  nystatin cream (MYCOSTATIN), Apply 1 application topically 2 (two) times daily as needed for dry skin., Disp: , Rfl:  .  predniSONE (DELTASONE) 10 MG tablet, Take 1 tablet (10 mg total) by mouth as needed., Disp: 30 tablet, Rfl: 3 .  spironolactone (ALDACTONE) 25 MG tablet, TAKE ONE TABLET BY MOUTH ONCE DAILY, Disp: 90 tablet, Rfl: 1 .  telmisartan (MICARDIS) 80 MG tablet, Take 1 tablet (80 mg total) by mouth daily., Disp: 90 tablet, Rfl: 0 .  Triamcinolone Acetonide (NASACORT ALLERGY 24HR NA), Place 2 sprays into the nose daily as needed. , Disp: , Rfl:

## 2016-12-30 NOTE — Addendum Note (Signed)
Addended by: Len Blalock on: 12/30/2016 04:51 PM   Modules accepted: Orders

## 2017-01-02 LAB — C-ANCA TITER

## 2017-01-02 LAB — ANCA SCREEN W REFLEX TITER: ANCA SCREEN: POSITIVE — AB

## 2017-01-03 ENCOUNTER — Telehealth: Payer: Self-pay

## 2017-01-03 NOTE — Telephone Encounter (Signed)
Also- note that I lmtcb X1 for pt to relay lab results.

## 2017-01-03 NOTE — Telephone Encounter (Signed)
lmtcb X1 for pt.  Will call in prednisone after speaking to pt.

## 2017-01-03 NOTE — Telephone Encounter (Signed)
-----   Message from Juanito Doom, MD sent at 01/03/2017 12:46 PM EDT ----- Regarding: RE: prednisone OK by me ----- Message ----- From: Len Blalock, CMA Sent: 01/02/2017   4:32 PM To: Juanito Doom, MD Subject: prednisone                                     I called pt with cxr results, and he was asking about prednisone- you had 3-10mg  tabs called in for him on Friday, but pt is asking for [place] to send in 30 tabs of 10mg  prednisone to have on hand for his wegeners.    Ok to send prednisone?  Thanks!

## 2017-01-05 MED ORDER — PREDNISONE 10 MG PO TABS: 10.0000 mg | ORAL_TABLET | Freq: Every day | ORAL | 0 refills | Status: DC

## 2017-01-05 NOTE — Telephone Encounter (Signed)
Spoke with pt and verified pharmacy. Rx was sent. Pt was also notified of lab results. He no additional questions at this time. Nothing further is needed

## 2017-01-05 NOTE — Telephone Encounter (Signed)
Pt returned Ashley's phone call 01/03/17 when our system was down

## 2017-01-09 ENCOUNTER — Other Ambulatory Visit: Payer: Self-pay | Admitting: Family Medicine

## 2017-02-01 ENCOUNTER — Other Ambulatory Visit: Payer: Self-pay | Admitting: Family Medicine

## 2017-02-14 ENCOUNTER — Other Ambulatory Visit (INDEPENDENT_AMBULATORY_CARE_PROVIDER_SITE_OTHER): Payer: Medicare HMO

## 2017-02-14 DIAGNOSIS — E11319 Type 2 diabetes mellitus with unspecified diabetic retinopathy without macular edema: Secondary | ICD-10-CM | POA: Diagnosis not present

## 2017-02-14 DIAGNOSIS — I10 Essential (primary) hypertension: Secondary | ICD-10-CM | POA: Diagnosis not present

## 2017-02-14 LAB — COMPREHENSIVE METABOLIC PANEL
ALBUMIN: 3.9 g/dL (ref 3.5–5.2)
ALK PHOS: 50 U/L (ref 39–117)
ALT: 27 U/L (ref 0–53)
AST: 19 U/L (ref 0–37)
BUN: 16 mg/dL (ref 6–23)
CALCIUM: 9.3 mg/dL (ref 8.4–10.5)
CHLORIDE: 104 meq/L (ref 96–112)
CO2: 28 mEq/L (ref 19–32)
CREATININE: 1 mg/dL (ref 0.40–1.50)
GFR: 77.52 mL/min (ref >60.00)
Glucose, Bld: 120 mg/dL — ABNORMAL HIGH (ref 70–99)
POTASSIUM: 4.2 meq/L (ref 3.5–5.1)
Sodium: 141 mEq/L (ref 135–145)
TOTAL PROTEIN: 6.5 g/dL (ref 6.0–8.3)
Total Bilirubin: 0.4 mg/dL (ref 0.2–1.2)

## 2017-02-14 LAB — HEMOGLOBIN A1C: HEMOGLOBIN A1C: 8.8 % — AB (ref 4.6–6.5)

## 2017-03-03 DIAGNOSIS — R69 Illness, unspecified: Secondary | ICD-10-CM | POA: Diagnosis not present

## 2017-03-14 ENCOUNTER — Other Ambulatory Visit: Payer: Medicare HMO

## 2017-03-14 ENCOUNTER — Ambulatory Visit (INDEPENDENT_AMBULATORY_CARE_PROVIDER_SITE_OTHER): Payer: Medicare HMO | Admitting: Pulmonary Disease

## 2017-03-14 ENCOUNTER — Encounter: Payer: Self-pay | Admitting: Pulmonary Disease

## 2017-03-14 VITALS — BP 120/72 | HR 74 | Ht 71.0 in | Wt 249.4 lb

## 2017-03-14 DIAGNOSIS — R0600 Dyspnea, unspecified: Secondary | ICD-10-CM

## 2017-03-14 DIAGNOSIS — R079 Chest pain, unspecified: Secondary | ICD-10-CM | POA: Diagnosis not present

## 2017-03-14 DIAGNOSIS — M3130 Wegener's granulomatosis without renal involvement: Secondary | ICD-10-CM

## 2017-03-14 NOTE — Progress Notes (Signed)
Subjective:    Patient ID: Ryan Morgan, male    DOB: [birth year], 76 y.o.   MRN: [id number]  Synopsis: Ryan Morgan was diagnosed with Wegener's granulomatosis in 1997 with joint pain, sinus disease. History with Cytoxan through 1999 for about 2 years total. Since then he had been treated off and on with prednisone. He states that he took prednisone nearly continuously for about 10 years. There is discrepancy in the notes, he feels that he did have significant respiratory symptoms related to Wegener's granulomatosis at 1 point and was treated with IV Solu-Medrol and hospital for this.  HPI Chief Complaint  Patient presents with  . Follow-up    pt states cough is improved about 80%- cough is prod with brown mucus . does note worsening sob with exertion.      Dictated said that after the last visit he has improved. Specifically the cough with brown mucus production cleared up. He had taking prednisone for about 3 weeks because he said that this helped him breathe better. He says that the sinus congestion has improved with Zyrtec and allergic rhinitis regimen we put him on. However, when he takes out the garbage he continues to struggle with chest pain and shortness of breath. He says this is worse in the cold weather. Last dose of prednisone was 3 weeks ago.   Past Medical History:  Diagnosis Date  . Coronary artery disease   . Diabetes mellitus   . Heart disease   . Hyperlipidemia   . Hypertension   . Wegener's granulomatosis ([place]) 2007    Review of Systems  Constitutional: Negative for chills, fatigue and fever.  HENT: Positive for ear pain. Negative for congestion, postnasal drip and rhinorrhea.   Respiratory: Positive for cough. Negative for shortness of breath and wheezing.   Cardiovascular: Negative for chest pain, palpitations and leg swelling.  Musculoskeletal: Positive for arthralgias and joint swelling.       Objective:   Physical Exam  Vitals:   03/14/17 1637  BP:  120/72  Pulse: 74  SpO2: 97%  Weight: 249 lb 6.4 oz (113.1 kg)  Height: 5\' 11"  (1.803 m)     Room air  Gen: obese but well appearing HENT: OP clear, TM's clear, neck supple PULM: CTA B, normal percussion CV: RRR, no mgr, trace edema GI: BS+, soft, nontender Derm: no cyanosis or rash Psyche: normal mood and affect   Pulmonary function test: July 2017 ratio normal, FVC 4.04 L 93% predicted, total lung capacity 7.01 L 99% protected, DLCO 30.50 mL 94% predicted  Chest imaging: August 2018 chest x-ray images independently reviewed showing questionable bronchitis changes but otherwise normal pulmonary parenchyma.     Assessment & Plan:   Wegener's granulomatosis ([place]) - Plan: ANCA Screen Reflex Titer  Dyspnea, unspecified type  Chest pain, unspecified type   Discussion: The airway symptoms cleared up with allergic rhinitis treatment but he continues to struggle with chest pain and shortness of breath. Because his oxygenation was normal in the last visit, his chest exam has always been normal, and his recent chest x-ray was really unremarkable it's difficult for me to say right now that the dyspnea is due to Wegener's in the lungs. I agree with Dr. Rockey Situ that deconditioning and obesity are the biggest cause. However, considering the chest pain he's been experiencing a think it's reasonable to perform a cardiac evaluation right now. If it turns out that there is nothing wrong with his heart that I can repeat high-resolution CT  scan of his chest and lung function test.  For Wegener's granulomatosis: We will check a serum antineutrophilic cytoplasmic antibody test today Continue taking prednisone as you have done in the past  For the shortness of breath with chest pain: I'm going to contact Dr. Rockey Situ to ask him to arrange for the nuclear stress test we discussed If those tests are normal then I will repeat a pulmonary function test and CT scan of the chest  We'll see you back in  about 2 months or sooner if needed   Current Outpatient Prescriptions:  .  amLODipine (NORVASC) 10 MG tablet, Take 1 tablet (10 mg total) by mouth daily., Disp: 90 tablet, Rfl: 3 .  aspirin 81 MG tablet, Take 81 mg by mouth daily. , Disp: , Rfl:  .  atorvastatin (LIPITOR) 40 MG tablet, Take 1 tablet (40 mg total) by mouth daily., Disp: 90 tablet, Rfl: 3 .  calcium carbonate 1250 MG capsule, Take 1,250 mg by mouth daily., Disp: , Rfl:  .  cetirizine (ZYRTEC) 10 MG tablet, Take 10 mg by mouth 2 (two) times daily., Disp: , Rfl:  .  cholecalciferol (VITAMIN D) 1000 units tablet, Take 1,000 Units by mouth daily., Disp: , Rfl:  .  clobetasol cream (TEMOVATE) 6.56 %, Apply 1 application topically 2 (two) times daily. To affected areas (Patient taking differently: Apply 1 application topically 2 (two) times daily as needed. To affected areas), Disp: 30 g, Rfl: 1 .  glimepiride (AMARYL) 4 MG tablet, TAKE TWO TABLETS BY MOUTH ONCE DAILY BEFORE BREAKFAST, Disp: 180 tablet, Rfl: 2 .  glucose blood (ONE TOUCH ULTRA TEST) test strip, Check blood sugar once daily and as directed Dx E11.319, Disp: 100 each, Rfl: 1 .  hydrOXYzine (ATARAX/VISTARIL) 25 MG tablet, TAKE ONE TABLET BY MOUTH THREE TIMES DAILY AS NEEDED FOR ITCHING (CAUTION OF SEDATION), Disp: 30 tablet, Rfl: 1 .  LANCETS ULTRA THIN 30G MISC, by Does not apply route. One Touch, Disp: , Rfl:  .  metFORMIN (GLUCOPHAGE) 1000 MG tablet, TAKE ONE TABLET BY MOUTH TWICE DAILY, Disp: 180 tablet, Rfl: 1 .  Multiple Vitamin (MULTIVITAMIN) capsule, Take 1 capsule by mouth daily., Disp: , Rfl:  .  nystatin cream (MYCOSTATIN), Apply 1 application topically 2 (two) times daily as needed for dry skin., Disp: , Rfl:  .  spironolactone (ALDACTONE) 25 MG tablet, TAKE ONE TABLET BY MOUTH ONCE DAILY, Disp: 90 tablet, Rfl: 1 .  telmisartan (MICARDIS) 80 MG tablet, TAKE 1 TABLET BY MOUTH ONCE DAILY, Disp: 90 tablet, Rfl: 0 .  Triamcinolone Acetonide (NASACORT ALLERGY 24HR  NA), Place 2 sprays into the nose at bedtime. , Disp: , Rfl:  .  predniSONE (DELTASONE) 10 MG tablet, Take 1 tablet (10 mg total) by mouth daily. (Patient not taking: Reported on 03/14/2017), Disp: 30 tablet, Rfl: 0

## 2017-03-14 NOTE — Patient Instructions (Signed)
For Wegener's granulomatosis: We will check a serum antineutrophilic cytoplasmic antibody test today Continue taking prednisone as you have done in the past  For the shortness of breath with chest pain: I'm going to contact Dr. Rockey Situ to ask him to arrange for the nuclear stress test we discussed If those tests are normal then I will repeat a pulmonary function test and CT scan of the chest  We'll see you back in about 2 months or sooner if needed

## 2017-03-15 ENCOUNTER — Telehealth: Payer: Self-pay | Admitting: *Deleted

## 2017-03-15 DIAGNOSIS — R0602 Shortness of breath: Secondary | ICD-10-CM

## 2017-03-15 DIAGNOSIS — R079 Chest pain, unspecified: Secondary | ICD-10-CM

## 2017-03-15 LAB — ANCA SCREEN W REFLEX TITER
ANCA Screen: POSITIVE — AB
C-ANCA Titer: 1:160 {titer} — ABNORMAL HIGH

## 2017-03-15 NOTE — Telephone Encounter (Signed)
Spoke with patient and reviewed recommendations for scheduling echocardiogram and lexiscan. He currently states that his wife is not at home with the calendar to schedule these to be done. Provided him with my number to have her give me a call when she returns to schedule these procedures. He was appreciative for the call and had no further questions at this time.

## 2017-03-15 NOTE — Telephone Encounter (Signed)
Spoke with patient and scheduled his stress test for 03/20/17. Reviewed all instructions for stress testing to include holding his diabetic medications the morning of his procedure. He verbalized understanding of all instructions, time, and location with no further questions or concerns at this time.   How to prepare for your Myoview test:  1. Do not eat or drink after midnight 2. No caffeine for 24 hours prior to test 3. No smoking 24 hours prior to test. 4. Your medication may be taken with water.  If your doctor stopped a medication because of this test, do not take that medication. 5. Ladies, please do not wear dresses.  Skirts or pants are appropriate. Please wear a short sleeve shirt. 6. No perfume, cologne or lotion. Wear comfortable walking shoes. No heels!

## 2017-03-15 NOTE — Telephone Encounter (Signed)
-----   Message from Minna Merritts, MD sent at 03/14/2017  6:31 PM EDT ----- Ryan Morgan, Can we call Ryan Morgan and see if he would like to order a pharmacologic Myoview and echocardiogram for his shortness of breath/chest pain/known CAD. Mention that we had talked with Dr. Lake Morgan. I think it is a good idea. thx TG   ----- Message ----- From: Juanito Doom, MD Sent: 03/14/2017   5:16 PM To: Minna Merritts, MD  Tim: Ryan Morgan continues to struggle with dyspnea. His chest x-ray and ambulatory oxygenation this year were normal. I think it's probably just deconditioning and obesity but considering the chest pain he's been experiencing a think it's reasonable to get the nuclear stress test and echocardiogram he mention in her last note. July me to order that or should you?  Thanks, Ruby Cola

## 2017-03-16 ENCOUNTER — Telehealth: Payer: Self-pay | Admitting: Pulmonary Disease

## 2017-03-16 NOTE — Telephone Encounter (Signed)
Notes recorded by Juanito Doom, MD on 03/15/2017 at 4:20 PM EDT A, Please let the patient know this was a little higher than the last test, but no higher than we have ever seen in the past. Thanks, B           Spoke with patient. He verbalized understanding. Nothing else needed at time of call.

## 2017-03-20 ENCOUNTER — Other Ambulatory Visit: Payer: Self-pay

## 2017-03-20 ENCOUNTER — Ambulatory Visit
Admission: RE | Admit: 2017-03-20 | Discharge: 2017-03-20 | Disposition: A | Discharge status: Home / Self Care | Payer: Medicare HMO | Source: Ambulatory Visit | Attending: Cardiovascular Disease | Admitting: Cardiovascular Disease

## 2017-03-20 ENCOUNTER — Ambulatory Visit (INDEPENDENT_AMBULATORY_CARE_PROVIDER_SITE_OTHER): Payer: Medicare HMO

## 2017-03-20 DIAGNOSIS — R079 Chest pain, unspecified: Secondary | ICD-10-CM | POA: Diagnosis not present

## 2017-03-20 DIAGNOSIS — R0602 Shortness of breath: Secondary | ICD-10-CM

## 2017-03-20 DIAGNOSIS — R9439 Abnormal result of other cardiovascular function study: Secondary | ICD-10-CM | POA: Insufficient documentation

## 2017-03-20 LAB — ECHOCARDIOGRAM COMPLETE
CHL CUP TV REG PEAK VELOCITY: 218 cm/s
EERAT: 7.71
EWDT: 222 ms
FS: 19 % — AB (ref 28–44)
IV/PV OW: 0.77
LA diam end sys: 37 mm
LA vol A4C: 84.5 ml
LA vol index: 43.1 mL/m2
LA vol: 100 mL
LADIAMINDEX: 1.59 cm/m2
LASIZE: 37 mm
LDCA: 3.46 cm2
LV E/e' medial: 7.71
LV PW d: 13 mm — AB (ref 0.6–1.1)
LV TDI E'LATERAL: 11.2
LV TDI E'MEDIAL: 7.62
LV e' LATERAL: 11.2 cm/s
LVDIAVOL: 108 mL (ref 62–150)
LVDIAVOLIN: 47 mL/m2
LVEEAVG: 7.71
LVOT SV: 78 mL
LVOT VTI: 22.5 cm
LVOTD: 21 mm
LVSYSVOL: 51 mL (ref 21–61)
LVSYSVOLIN: 22 mL/m2
MV Dec: 222
MV Peak grad: 3 mmHg
MV pk E vel: 86.3 m/s
MVPKAVEL: 83.4 m/s
RV LATERAL S' VELOCITY: 12.7 cm/s
RV TAPSE: 29.3 mm
Simpson's disk: 52
Stroke v: 57 ml
TR max vel: 218 cm/s

## 2017-03-20 LAB — NM MYOCAR MULTI W/SPECT W/WALL MOTION / EF
CHL CUP NUCLEAR SSS: 22
CSEPPHR: 88 {beats}/min
LV sys vol: 93 mL
LVDIAVOL: 1 mL (ref 62–150)
NUC STRESS TID: 1.06
Percent HR: 60 %
Rest HR: 67 {beats}/min
SDS: 0
SRS: 31

## 2017-03-20 MED ORDER — TECHNETIUM TC 99M TETROFOSMIN IV KIT
13.0000 | PACK | Freq: Once | INTRAVENOUS | Status: AC | PRN
  Administered 2017-03-20: 12.04 via INTRAVENOUS

## 2017-03-20 MED ORDER — REGADENOSON 0.4 MG/5ML IV SOLN
0.4000 mg | Freq: Once | INTRAVENOUS | Status: AC
  Administered 2017-03-20: 0.4 mg via INTRAVENOUS

## 2017-03-20 MED ORDER — TECHNETIUM TC 99M TETROFOSMIN IV KIT
30.3100 | PACK | Freq: Once | INTRAVENOUS | Status: AC | PRN
  Administered 2017-03-20: 30.31 via INTRAVENOUS

## 2017-03-21 DIAGNOSIS — L989 Disorder of the skin and subcutaneous tissue, unspecified: Secondary | ICD-10-CM | POA: Diagnosis not present

## 2017-03-21 DIAGNOSIS — L309 Dermatitis, unspecified: Secondary | ICD-10-CM | POA: Diagnosis not present

## 2017-03-23 ENCOUNTER — Encounter: Payer: Self-pay | Admitting: Cardiovascular Disease

## 2017-03-23 ENCOUNTER — Encounter: Payer: Self-pay | Admitting: *Deleted

## 2017-03-23 ENCOUNTER — Ambulatory Visit (INDEPENDENT_AMBULATORY_CARE_PROVIDER_SITE_OTHER): Payer: Medicare HMO | Admitting: Cardiovascular Disease

## 2017-03-23 VITALS — BP 128/70 | HR 78 | Ht 71.0 in | Wt 248.0 lb

## 2017-03-23 DIAGNOSIS — Z01812 Encounter for preprocedural laboratory examination: Secondary | ICD-10-CM | POA: Diagnosis not present

## 2017-03-23 NOTE — Patient Instructions (Addendum)
Medication Instructions:   No medication changes made  For itch, Try generic pepcid (ranitidine/famotidine) And benedryl  Ok to do a trial off the telmisartan/micardis Would recommend we hold the amlodipine Start carvedilol 6.25 mg twice daily   Labwork:  Labs to be done at [facility] Entrance  Testing/Procedures:  No further testing at this time  See letter for cath instructions.   Follow-Up: It was a pleasure seeing you in the office today. Please call us if you have new issues that need to be addressed before your next appt.  [phone]  Your physician wants you to follow-up in: 3 months.  You will receive a reminder letter in the mail two months in advance. If you don't receive a letter, please call our office to schedule the follow-up appointment.  If you need a refill on your cardiac medications before your next appointment, please call your pharmacy.

## 2017-03-23 NOTE — Progress Notes (Signed)
Patient ID: Ryan Morgan, male   DOB: 06-21-1940, 76 y.o.   MRN: [id number] Cardiology Office Note  Date:  03/23/2017   ID:  Ryan Morgan, DOB [birth year], MRN [id number]  PCP:  Abner Greenspan, MD   Chief Complaint  Patient presents with  . other    Follow up from [place]. Meds reviewed by the pt. verbally.     HPI:  [name] is a very pleasant 76 year old gentleman with a history of  DM,  smoking hx,  coronary artery disease,  stent placed to his LAD in 2000,  (Lad stent 3.0 x 18 in 2000) repeat catheterization in January 2003 with 40% LAD, 50% diagonal disease, history of Wegener's granulomatosis, on chronic steroids diabetes. Prior thoracotomy surgery on the left for lung nodule, chronic pain on the left that is episodic, stable over several years Chronic shortness of breath, sedentary lifestyle, obesity/deconditioning who presents for follow up of his CAD and shortness of breath  In follow-up today he presents to discuss his shortness of breath symptoms, fatigue, chest tightness  We discussed his recent echocardiogram showing ejection fraction 35% with large region of anterior wall hypokinesis Region of hypokinesis possibly secondary to prior anterior wall MI in 2000 at which time stent was placed No prior echocardiogram for comparison  Also discussed his stress test He has fixed anterior , anteroseptal wall defect consistent with previous MI  Also with inferior wall decreased perfusion with component of  Ischemia  raising the concern of RCA disease Effusion in the lateral wall appears to be preserved Ejection fraction is depressed Images pulled up in the office and discussed with him in detail  Recently seen by Dr. Lake Bells He is being evaluated for shortness of breath  He does not exercise, weight has trended upwards, Very sedentary lifestyle  Previously declined workup including stress test and echocardiogram until recently  Lab work reviewed with him Total  chol 117 LDL 46 HBA1C 8.6, weight has been trending up  Previous EKG Shows no sinus rhythm rate 79 bpm old inferior MI, no change compared to EKG January 2017  Other past medical history reviewed Stress test from January 2009 was a treadmill study, ejection fraction estimated at 45%, with medium-sized area of moderate to severe hypoperfusion involving the septal region.     PMH:   has a past medical history of Coronary artery disease; Diabetes mellitus; Heart disease; Hyperlipidemia; Hypertension; and Wegener's granulomatosis (Ryan Morgan) (2007).  PSH:    Past Surgical History:  Procedure Laterality Date  . BASAL CELL CARCINOMA EXCISION     removed from face x 3  . CARDIAC CATHETERIZATION    . CARDIAC SURGERY  2000   stent in heart  . CORONARY ANGIOPLASTY  09/22/1998   s/p stent placement; [facility] 3.0 x 18 mm Ref [id number]  . LUNG SURGERY     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    Current Outpatient Prescriptions  Medication Sig Dispense Refill  . amLODipine (NORVASC) 10 MG tablet Take 1 tablet (10 mg total) by mouth daily. 90 tablet 3  . aspirin 81 MG tablet Take 81 mg by mouth daily.     Marland Kitchen atorvastatin (LIPITOR) 40 MG tablet Take 1 tablet (40 mg total) by mouth daily. 90 tablet 3  . calcium carbonate 1250 MG capsule Take 1,250 mg by mouth daily.    . cetirizine (ZYRTEC) 10 MG tablet Take 10 mg by mouth 2 (two) times daily.    . cholecalciferol (VITAMIN D) 1000  units tablet Take 1,000 Units by mouth daily.    . clobetasol cream (TEMOVATE) 9.60 % Apply 1 application topically 2 (two) times daily. To affected areas (Patient taking differently: Apply 1 application topically 2 (two) times daily as needed. To affected areas) 30 g 1  . glimepiride (AMARYL) 4 MG tablet TAKE TWO TABLETS BY MOUTH ONCE DAILY BEFORE BREAKFAST 180 tablet 2  . glucose blood (ONE TOUCH ULTRA TEST) test strip Check blood sugar once daily and as directed Dx E11.319 100 each 1  . hydrOXYzine (ATARAX/VISTARIL)  25 MG tablet TAKE ONE TABLET BY MOUTH THREE TIMES DAILY AS NEEDED FOR ITCHING (CAUTION OF SEDATION) 30 tablet 1  . LANCETS ULTRA THIN 30G MISC by Does not apply route. One Touch    . metFORMIN (GLUCOPHAGE) 1000 MG tablet TAKE ONE TABLET BY MOUTH TWICE DAILY 180 tablet 1  . Multiple Vitamin (MULTIVITAMIN) capsule Take 1 capsule by mouth daily.    Marland Kitchen nystatin cream (MYCOSTATIN) Apply 1 application topically 2 (two) times daily as needed for dry skin.    . predniSONE (DELTASONE) 10 MG tablet Take 1 tablet (10 mg total) by mouth daily. 30 tablet 0  . spironolactone (ALDACTONE) 25 MG tablet TAKE ONE TABLET BY MOUTH ONCE DAILY 90 tablet 1  . telmisartan (MICARDIS) 80 MG tablet TAKE 1 TABLET BY MOUTH ONCE DAILY 90 tablet 0  . Triamcinolone Acetonide (NASACORT ALLERGY 24HR NA) Place 2 sprays into the nose at bedtime.      No current facility-administered medications for this visit.      Allergies:   Benicar hct [olmesartan medoxomil-hctz]; Bystolic [nebivolol hcl]; Niaspan [niacin er]; and Septra [bactrim]   Social History:  The patient  reports that he quit smoking about 38 years ago. His smoking use included Cigarettes. He has a 10.00 pack-year smoking history. He has never used smokeless tobacco. He reports that he does not drink alcohol or use drugs.   Family History:   family history includes Cancer in his mother; Stroke in his maternal grandfather.    Review of Systems: Review of Systems  Constitutional: Positive for malaise/fatigue.  Respiratory: Positive for shortness of breath.   Cardiovascular: Positive for chest pain.  Gastrointestinal: Negative.   Musculoskeletal: Negative.   Neurological: Negative.   Psychiatric/Behavioral: Negative.   All other systems reviewed and are negative.    PHYSICAL EXAM: VS:  BP 128/70 (BP Location: Left Arm, Patient Position: Sitting, Cuff Size: Normal)   Pulse 78   Ht 5\' 11"  (1.803 m)   Wt 248 lb (112.5 kg)   BMI 34.59 kg/m  , BMI Body mass  index is 34.59 kg/m. No significant change compared to previous office visit GEN: Well nourished, well developed, in no acute distress, obese  HEENT: normal  Neck: no JVD, carotid bruits, or masses Cardiac: RRR; no murmurs, rubs, or gallops,no edema  Respiratory:  clear to auscultation bilaterally, normal work of breathing GI: soft, nontender, nondistended, + BS MS: no deformity or atrophy  Skin: warm and dry, no rash Neuro:  Strength and sensation are intact Psych: euthymic mood, full affect    Recent Labs: 11/10/2016: Hemoglobin 12.7; Platelets 204.0; TSH 1.64 02/14/2017: ALT 27; BUN 16; Creatinine, Ser 1.00; Potassium 4.2; Sodium 141    Lipid Panel Lab Results  Component Value Date   CHOL 117 11/10/2016   HDL 32.20 (L) 11/10/2016   LDLCALC 46 11/10/2016   TRIG 196.0 (H) 11/10/2016      Wt Readings from Last 3 Encounters:  03/23/17  248 lb (112.5 kg)  03/14/17 249 lb 6.4 oz (113.1 kg)  12/30/16 247 lb (112 kg)       ASSESSMENT AND PLAN:  Atherosclerosis of native coronary artery of native heart without angina pectoris - He reports having chest pain when walking outside, worsening shortness of breath Long discussion concerning recent echocardiogram and stress test results Prior anterior wall MI 2000 Now with inferior wall perfusion defect, component of ischemia Ejection fraction appears lower than previous estimates by stress tests in 2009 was 45%, now 30-35% Long discussion concerning various treatment options, recommended cardiac catheterization given his symptoms.  I have reviewed the risks, indications, and alternatives to cardiac catheterization, possible angioplasty, and stenting with the patient. Risks include but are not limited to bleeding, infection, vascular injury, stroke, myocardial infection, arrhythmia, kidney injury, radiation-related injury in the case of prolonged fluoroscopy use, emergency cardiac surgery, and death. The patient understands the risks  of serious complication is 1-2 in 7628 with diagnostic cardiac cath and 1-2% or less with angioplasty/stenting.  He is willing to proceed with cardiac catheterization in [facility] next Wednesday with Dr. Fletcher Anon.  Continue aspirin, statin, We will recommend he start carvedilol  Cardiomyopathy He is on ARB, spironolactone Currently not on a beta-blocker.  He had intolerance of Bystolic He does have underlying lung disease We will need to hold his amlodipine.  We will start carvedilol 6.25 mg twice daily  Essential hypertension Consider changes as above He is concerned rash on his hands is from the ARB.  He will hold this for a short period of time He reports having similar problems on Benicar  Hyperlipemia Cholesterol is at goal on the current lipid regimen. No changes to the medications were made.  Shortness of breath obesity, deconditioning Unable to exclude component of Wegener's. Recent stress test showing ischemia and moderately depressed ejection fraction on echocardiogram  Morbid Obesity We have encouraged continued exercise, careful diet management in an effort to lose weight.  Type 2 diabetes mellitus with retinopathy without macular edema, without long-term current use of insulin, unspecified laterality, unspecified retinopathy severity ([place]) Tremendous weight gain over the past several years, sedentary lifestyle, no regular exercise program.  He would likely benefit from cardiac rehab  Will wait on placing referral until after his cardiac catheterization  Disposition:   F/U  3 months   Total encounter time more than 60 minutes  Greater than 50% was spent in counseling and coordination of care with the patient    No orders of the defined types were placed in this encounter.    Signed, Esmond Plants, M.D., Ph.D. 03/23/2017  [place], [place]

## 2017-03-24 ENCOUNTER — Other Ambulatory Visit
Admission: RE | Admit: 2017-03-24 | Discharge: 2017-03-24 | Disposition: A | Discharge status: Home / Self Care | Payer: Medicare HMO | Source: Ambulatory Visit | Attending: Cardiovascular Disease | Admitting: Cardiovascular Disease

## 2017-03-24 ENCOUNTER — Telehealth: Payer: Self-pay | Admitting: *Deleted

## 2017-03-24 DIAGNOSIS — Z01812 Encounter for preprocedural laboratory examination: Secondary | ICD-10-CM | POA: Insufficient documentation

## 2017-03-24 LAB — BASIC METABOLIC PANEL
Anion gap: 10 (ref 5–15)
BUN: 18 mg/dL (ref 6–20)
CALCIUM: 9.1 mg/dL (ref 8.9–10.3)
CHLORIDE: 102 mmol/L (ref 101–111)
CO2: 25 mmol/L (ref 22–32)
Creatinine, Ser: 0.93 mg/dL (ref 0.61–1.24)
GFR calc non Af Amer: >60 mL/min (ref >60)
GLUCOSE: 256 mg/dL — AB (ref 65–99)
Potassium: 4.2 mmol/L (ref 3.5–5.1)
SODIUM: 137 mmol/L (ref 135–145)

## 2017-03-24 LAB — CBC WITH DIFFERENTIAL/PLATELET
BASOS PCT: 0 %
Basophils Absolute: 0 10*3/uL (ref 0–0.1)
EOS ABS: 0 10*3/uL (ref 0–0.7)
Eosinophils Relative: 0 %
HCT: 37.1 % — ABNORMAL LOW (ref 40.0–52.0)
HEMOGLOBIN: 12.9 g/dL — AB (ref 13.0–18.0)
LYMPHS ABS: 1.2 10*3/uL (ref 1.0–3.6)
Lymphocytes Relative: 15 %
MCH: 31.9 pg (ref 26.0–34.0)
MCHC: 34.8 g/dL (ref 32.0–36.0)
MCV: 91.6 fL (ref 80.0–100.0)
MONO ABS: 0.5 10*3/uL (ref 0.2–1.0)
MONOS PCT: 6 %
NEUTROS PCT: 79 %
Neutro Abs: 6.1 10*3/uL (ref 1.4–6.5)
Platelets: 212 10*3/uL (ref 150–440)
RBC: 4.06 MIL/uL — ABNORMAL LOW (ref 4.40–5.90)
RDW: 13.7 % (ref 11.5–14.5)
WBC: 7.8 10*3/uL (ref 3.8–10.6)

## 2017-03-24 LAB — PROTIME-INR
INR: 1.05
Prothrombin Time: 13.6 seconds (ref 11.4–15.2)

## 2017-03-24 MED ORDER — CARVEDILOL 6.25 MG PO TABS: 6.2500 mg | ORAL_TABLET | Freq: Two times a day (BID) | ORAL | 3 refills | Status: DC

## 2017-03-24 NOTE — Telephone Encounter (Signed)
Spoke with patient and advised that he hold amlodipine and start carvedilol 6.25 mg twice a day. He verbalized understanding and has no further questions at this time. Patient states that the pill which they thought was causing the rash was previously increased and he wanted Dr. Rockey Situ to be aware of that. Instructed him to give [place] a call if he should have any further questions.

## 2017-03-24 NOTE — Telephone Encounter (Signed)
-----   Message from Minna Merritts, MD sent at 03/23/2017  6:47 PM EDT ----- Can we hold his amlodipine This can make his low ejection fraction worse To make his heart pump stronger would prefer he start Coreg 6.25 mg twice daily thx TG

## 2017-03-28 ENCOUNTER — Telehealth: Payer: Self-pay

## 2017-03-28 NOTE — Telephone Encounter (Signed)
Patient contacted pre-catheterization at [facility] scheduled for: Verified arrival time and place: Confirmed AM meds to be taken pre-cath with sip of water: Pt hold metformin last dose 10/29 Take ASA/carvedilol Hold Amaryl, spiro Confirmed patient has responsible person to drive home post procedure and observe patient for 24 hours:  yes Addl concerns:  none

## 2017-03-29 ENCOUNTER — Ambulatory Visit (HOSPITAL_COMMUNITY)
Admission: RE | Admit: 2017-03-29 | Discharge: 2017-03-29 | Disposition: A | Discharge status: Home / Self Care | Payer: Medicare HMO | Source: Ambulatory Visit | Attending: Cardiovascular Disease | Admitting: Cardiovascular Disease

## 2017-03-29 ENCOUNTER — Encounter (HOSPITAL_COMMUNITY)
Admission: RE | Disposition: A | Discharge status: Home / Self Care | Payer: Self-pay | Source: Ambulatory Visit | Attending: Cardiovascular Disease

## 2017-03-29 ENCOUNTER — Telehealth: Payer: Self-pay | Admitting: Cardiovascular Disease

## 2017-03-29 DIAGNOSIS — T82858A Stenosis of vascular prosthetic devices, implants and grafts, initial encounter: Secondary | ICD-10-CM | POA: Diagnosis not present

## 2017-03-29 DIAGNOSIS — I10 Essential (primary) hypertension: Secondary | ICD-10-CM | POA: Diagnosis not present

## 2017-03-29 DIAGNOSIS — Z79899 Other long term (current) drug therapy: Secondary | ICD-10-CM | POA: Insufficient documentation

## 2017-03-29 DIAGNOSIS — Z85828 Personal history of other malignant neoplasm of skin: Secondary | ICD-10-CM | POA: Insufficient documentation

## 2017-03-29 DIAGNOSIS — Z6834 Body mass index (BMI) 34.0-34.9, adult: Secondary | ICD-10-CM | POA: Diagnosis not present

## 2017-03-29 DIAGNOSIS — E11319 Type 2 diabetes mellitus with unspecified diabetic retinopathy without macular edema: Secondary | ICD-10-CM | POA: Insufficient documentation

## 2017-03-29 DIAGNOSIS — F17210 Nicotine dependence, cigarettes, uncomplicated: Secondary | ICD-10-CM | POA: Diagnosis not present

## 2017-03-29 DIAGNOSIS — Z7984 Long term (current) use of oral hypoglycemic drugs: Secondary | ICD-10-CM | POA: Insufficient documentation

## 2017-03-29 DIAGNOSIS — E785 Hyperlipidemia, unspecified: Secondary | ICD-10-CM | POA: Insufficient documentation

## 2017-03-29 DIAGNOSIS — Y838 Other surgical procedures as the cause of abnormal reaction of the patient, or of later complication, without mention of misadventure at the time of the procedure: Secondary | ICD-10-CM | POA: Insufficient documentation

## 2017-03-29 DIAGNOSIS — I25118 Atherosclerotic heart disease of native coronary artery with other forms of angina pectoris: Secondary | ICD-10-CM | POA: Diagnosis not present

## 2017-03-29 DIAGNOSIS — R5381 Other malaise: Secondary | ICD-10-CM | POA: Diagnosis not present

## 2017-03-29 DIAGNOSIS — Z7952 Long term (current) use of systemic steroids: Secondary | ICD-10-CM | POA: Diagnosis not present

## 2017-03-29 DIAGNOSIS — I429 Cardiomyopathy, unspecified: Secondary | ICD-10-CM | POA: Diagnosis not present

## 2017-03-29 DIAGNOSIS — Z955 Presence of coronary angioplasty implant and graft: Secondary | ICD-10-CM

## 2017-03-29 DIAGNOSIS — R69 Illness, unspecified: Secondary | ICD-10-CM | POA: Diagnosis not present

## 2017-03-29 DIAGNOSIS — Z7982 Long term (current) use of aspirin: Secondary | ICD-10-CM | POA: Insufficient documentation

## 2017-03-29 DIAGNOSIS — I252 Old myocardial infarction: Secondary | ICD-10-CM | POA: Diagnosis not present

## 2017-03-29 HISTORY — PX: LEFT HEART CATH AND CORONARY ANGIOGRAPHY: CATH118249

## 2017-03-29 HISTORY — PX: ULTRASOUND GUIDANCE FOR VASCULAR ACCESS: SHX6516

## 2017-03-29 HISTORY — PX: CORONARY STENT INTERVENTION: CATH118234

## 2017-03-29 LAB — GLUCOSE, CAPILLARY
GLUCOSE-CAPILLARY: 176 mg/dL — AB (ref 65–99)
GLUCOSE-CAPILLARY: 141 mg/dL — AB (ref 65–99)

## 2017-03-29 LAB — POCT ACTIVATED CLOTTING TIME: Activated Clotting Time: 257 seconds

## 2017-03-29 LAB — CARDIAC CATHETERIZATION: Cath EF Quantitative: 40 %

## 2017-03-29 SURGERY — LEFT HEART CATH AND CORONARY ANGIOGRAPHY
Anesthesia: LOCAL

## 2017-03-29 MED ORDER — HEPARIN SODIUM (PORCINE) 1000 UNIT/ML IJ SOLN
INTRAMUSCULAR | Status: DC | PRN
  Administered 2017-03-29: 6000 [IU] via INTRAVENOUS
  Administered 2017-03-29: 5000 [IU] via INTRAVENOUS

## 2017-03-29 MED ORDER — NITROGLYCERIN 1 MG/10 ML FOR IR/CATH LAB
INTRA_ARTERIAL | Status: DC | PRN
  Administered 2017-03-29: 200 ug via INTRACORONARY

## 2017-03-29 MED ORDER — HEPARIN (PORCINE) IN NACL 2-0.9 UNIT/ML-% IJ SOLN
INTRAMUSCULAR | Status: AC | PRN
  Administered 2017-03-29: 1000 mL

## 2017-03-29 MED ORDER — IOPAMIDOL (ISOVUE-370) INJECTION 76%
INTRAVENOUS | Status: DC | PRN
  Administered 2017-03-29: 140 mL via INTRAVENOUS

## 2017-03-29 MED ORDER — SODIUM CHLORIDE 0.9 % WEIGHT BASED INFUSION: 1.0000 mL/kg/h | INTRAVENOUS | Status: DC

## 2017-03-29 MED ORDER — SODIUM CHLORIDE 0.9 % WEIGHT BASED INFUSION
3.0000 mL/kg/h | INTRAVENOUS | Status: AC
  Administered 2017-03-29: 3 mL/kg/h via INTRAVENOUS

## 2017-03-29 MED ORDER — VERAPAMIL HCL 2.5 MG/ML IV SOLN
INTRAVENOUS | Status: AC
Start: 2017-03-29 — End: 2017-03-29
  Filled 2017-03-29: qty 2

## 2017-03-29 MED ORDER — VERAPAMIL HCL 2.5 MG/ML IV SOLN
INTRAVENOUS | Status: DC | PRN
  Administered 2017-03-29: 10 mL via INTRA_ARTERIAL

## 2017-03-29 MED ORDER — CLOPIDOGREL BISULFATE 300 MG PO TABS
ORAL_TABLET | ORAL | Status: DC | PRN
  Administered 2017-03-29: 600 mg via ORAL

## 2017-03-29 MED ORDER — CLOPIDOGREL BISULFATE 75 MG PO TABS: 75.0000 mg | ORAL_TABLET | Freq: Every day | ORAL | 11 refills | Status: DC

## 2017-03-29 MED ORDER — LIDOCAINE HCL 2 % IJ SOLN
INTRAMUSCULAR | Status: AC
  Filled 2017-03-29: qty 20

## 2017-03-29 MED ORDER — LIDOCAINE HCL 2 % IJ SOLN
INTRAMUSCULAR | Status: DC | PRN
  Administered 2017-03-29: 2 mL

## 2017-03-29 MED ORDER — HEPARIN (PORCINE) IN NACL 2-0.9 UNIT/ML-% IJ SOLN
INTRAMUSCULAR | Status: AC
  Filled 2017-03-29: qty 1000

## 2017-03-29 MED ORDER — NITROGLYCERIN 1 MG/10 ML FOR IR/CATH LAB
INTRA_ARTERIAL | Status: AC
  Filled 2017-03-29: qty 10

## 2017-03-29 MED ORDER — CLOPIDOGREL BISULFATE 300 MG PO TABS
ORAL_TABLET | ORAL | Status: AC
  Filled 2017-03-29: qty 2

## 2017-03-29 MED ORDER — SODIUM CHLORIDE 0.9% FLUSH: 3.0000 mL | INTRAVENOUS | Status: DC | PRN

## 2017-03-29 MED ORDER — SODIUM CHLORIDE 0.9% FLUSH: 3.0000 mL | Freq: Two times a day (BID) | INTRAVENOUS | Status: DC

## 2017-03-29 MED ORDER — MIDAZOLAM HCL 2 MG/2ML IJ SOLN
INTRAMUSCULAR | Status: DC | PRN
  Administered 2017-03-29: 1 mg via INTRAVENOUS

## 2017-03-29 MED ORDER — SODIUM CHLORIDE 0.9 % IV SOLN: 250.0000 mL | INTRAVENOUS | Status: DC | PRN

## 2017-03-29 MED ORDER — MIDAZOLAM HCL 2 MG/2ML IJ SOLN
INTRAMUSCULAR | Status: AC
  Filled 2017-03-29: qty 2

## 2017-03-29 MED ORDER — HEPARIN SODIUM (PORCINE) 1000 UNIT/ML IJ SOLN
INTRAMUSCULAR | Status: AC
  Filled 2017-03-29: qty 1

## 2017-03-29 MED ORDER — SODIUM CHLORIDE 0.9 % IV SOLN: INTRAVENOUS | Status: AC

## 2017-03-29 MED ORDER — FENTANYL CITRATE (PF) 100 MCG/2ML IJ SOLN
INTRAMUSCULAR | Status: DC | PRN
  Administered 2017-03-29: 25 ug via INTRAVENOUS

## 2017-03-29 MED ORDER — IOPAMIDOL (ISOVUE-370) INJECTION 76%
INTRAVENOUS | Status: AC
Start: 2017-03-29 — End: 2017-03-29
  Filled 2017-03-29: qty 100

## 2017-03-29 MED ORDER — ASPIRIN 81 MG PO CHEW: 81.0000 mg | CHEWABLE_TABLET | ORAL | Status: DC

## 2017-03-29 MED ORDER — FENTANYL CITRATE (PF) 100 MCG/2ML IJ SOLN
INTRAMUSCULAR | Status: AC
  Filled 2017-03-29: qty 2

## 2017-03-29 MED ORDER — CLOPIDOGREL BISULFATE 75 MG PO TABS: 75.0000 mg | ORAL_TABLET | Freq: Every day | ORAL | 0 refills | Status: DC

## 2017-03-29 MED FILL — CLOPIDOGREL 75 MG TABLET: 75 | 30 days supply | Qty: 30 | Fill #0

## 2017-03-29 SURGICAL SUPPLY — 20 items
BALLN EMERGE MR 2.5X12 (BALLOONS) ×3
BALLN SAPPHIRE ~~LOC~~ 2.75X8 (BALLOONS) ×3 IMPLANT
BALLOON EMERGE MR 2.5X12 (BALLOONS) ×2 IMPLANT
CATH INFINITI 5FR ANG PIGTAIL (CATHETERS) ×3 IMPLANT
CATH LAUNCHER 6FR JR4 (CATHETERS) ×3 IMPLANT
CATH OPTITORQUE JACKY 4.0 5F (CATHETERS) ×3 IMPLANT
COVER PRB 48X5XTLSCP FOLD TPE (BAG) ×2 IMPLANT
COVER PROBE 5X48 (BAG) ×1
DEVICE RAD COMP TR BAND LRG (VASCULAR PRODUCTS) ×3 IMPLANT
GLIDESHEATH SLEND SS 6F .021 (SHEATH) ×3 IMPLANT
GUIDEWIRE INQWIRE 1.5J.035X260 (WIRE) ×2 IMPLANT
INQWIRE 1.5J .035X260CM (WIRE) ×3
KIT ENCORE 26 ADVANTAGE (KITS) ×3 IMPLANT
KIT HEART LEFT (KITS) ×3 IMPLANT
PACK CARDIAC CATHETERIZATION (CUSTOM PROCEDURE TRAY) ×3 IMPLANT
STENT SIERRA 2.50 X 12 MM (Permanent Stent) ×3 IMPLANT
SYR MEDRAD MARK V 150ML (SYRINGE) ×3 IMPLANT
TRANSDUCER W/STOPCOCK (MISCELLANEOUS) ×3 IMPLANT
TUBING CIL FLEX 10 FLL-RA (TUBING) ×3 IMPLANT
WIRE RUNTHROUGH .014X180CM (WIRE) ×3 IMPLANT

## 2017-03-29 NOTE — Discharge Summary (Signed)
Discharge Summary/Same Day PCI    Patient ID: Ryan Morgan,  MRN: [id number], DOB/AGE: [birth year] 76 y.o.  Admit date: 03/29/2017 Discharge date: 03/29/2017  Primary Care Provider: Loura Pardon A Primary Cardiologist: Rockey Situ   Discharge Diagnoses    Active Problems:   Coronary artery disease of native artery of native heart with stable angina pectoris (HCC)   Allergies Allergies  Allergen Reactions  . Benicar Hct [Olmesartan Medoxomil-Hctz]     Painful and itchy knots on palms of hands  . Bystolic [Nebivolol Hcl]     headache  . Lovaza [Omega-3-Acid Ethyl Esters] Other (See Comments)    Unsure of reaction type  . Niaspan [Niacin Er] Other (See Comments)    flushing  . Septra [Bactrim] Hives    Diagnostic Studies/Procedures    Cath: 03/29/17  Conclusion     Ost 1st Mrg to 1st Mrg lesion, 90 %stenosed.  Prox LAD to Mid LAD lesion, 40 %stenosed.  1st Diag lesion, 100 %stenosed.  RPDA lesion, 100 %stenosed.  There is mild to moderate left ventricular systolic dysfunction.  LV end diastolic pressure is mildly elevated.  Post Atrio lesion, 80 %stenosed.  Post intervention, there is a 0% residual stenosis.  A stent was successfully placed.   1.  Patent LAD stent with mild to moderate in-stent restenosis.  Occluded first diagonal, significant disease in a small OM1, occluded right PDA with left-to-right collaterals, severe disease in the right posterior AV groove artery at the ostium supplying large posterolateral branches.  This correlated with ischemia on nuclear stress test.  2.  Mildly to moderately reduced LV systolic function with an EF of 40% and mildly elevated left ventricular end-diastolic pressure.  3.  Successful angioplasty and drug-eluting stent placement to the ostial right posterior AV groove artery.  Recommendations: Dual antiplatelet therapy for at least 6 months.  Aggressive treatment of risk factors.  Rest of coronary artery disease  can be managed medically.   _____________   History of Present Illness     76 yo male with PMH of CAD s/p multiple stents in the LAD, HTN, DM, Wegener's granulomatosis who was recently seen in the office on 03/23/17 by Dr. Rockey Situ. Reported having chest pain and was sent from outpatient stress test and echo. Stress test was abnormal showing concern for inferior wall ischemia, and echo noted EF of 35% with large area of hypokinesis in the anterior wall felt to be secondary to MI in 2000. With reduction in EF and stress test results he was set up for outpatient cardiac cath.   Hospital Course     Underwent outpatient cardiac cath with Dr. Fletcher Anon that showed patent LAD stent with mild/moderate ISR, with occluded first diagonal. Occluded RPDA with left to right collaterals, and severe disease in the right posterior AV groove which correlated with ischemia on stress test. Successful PCI/DES to right posterior AV groove. Plan for DAPT with ASA/plavix for at least 6 months, along with aggressive treatment of risk factors. He was seen/educated by cardiac rehab in short stay. Instructions/precautions given prior to discharge. Radial site stable.   [name] was seen by Dr. Fletcher Anon and determined stable for discharge home. Follow up in the office has been arranged. Medications are listed below.   _____________  Discharge Vitals Blood pressure (!) 152/66, pulse 64, temperature (!) 97.5 F (36.4 C), temperature source Oral, resp. rate 15, height 5\' 11"  (1.803 m), weight 248 lb (112.5 kg), SpO2 97 %.  Filed Weights  03/29/17 0807  Weight: 248 lb (112.5 kg)    Labs & Radiologic Studies    CBC No results for input(s): WBC, NEUTROABS, HGB, HCT, MCV, PLT in the last 72 hours. Basic Metabolic Panel No results for input(s): NA, K, CL, CO2, GLUCOSE, BUN, CREATININE, CALCIUM, MG, PHOS in the last 72 hours. Liver Function Tests No results for input(s): AST, ALT, ALKPHOS, BILITOT, PROT, ALBUMIN in the last  72 hours. No results for input(s): LIPASE, AMYLASE in the last 72 hours. Cardiac Enzymes No results for input(s): CKTOTAL, CKMB, CKMBINDEX, TROPONINI in the last 72 hours. BNP Invalid input(s): POCBNP D-Dimer No results for input(s): DDIMER in the last 72 hours. Hemoglobin A1C No results for input(s): HGBA1C in the last 72 hours. Fasting Lipid Panel No results for input(s): CHOL, HDL, LDLCALC, TRIG, CHOLHDL, LDLDIRECT in the last 72 hours. Thyroid Function Tests No results for input(s): TSH, T4TOTAL, T3FREE, THYROIDAB in the last 72 hours.  Invalid input(s): FREET3 _____________  Nm Myocar Multi W/spect W/wall Motion / Ef  Result Date: 03/20/2017  T wave inversion was noted during stress in the III and aVF leads.  There was no ST segment deviation noted during stress.  Defect 1: There is a large defect of severe severity present in the basal inferoseptal, basal inferior, mid inferoseptal, mid inferior and apical inferior location.  Findings consistent with prior myocardial infarction with significant peri-infarct ischemia.  This is a high risk study.  Nuclear stress EF: 36%.    Disposition   Pt is being discharged home today in good condition.  Follow-up Plans & Appointments    Follow-up Information    Rise Mu, PA-C. Schedule an appointment as soon as possible for a visit on 04/19/2017.   Specialties:  Physician Assistant, Cardiology, Radiology Why:  at 2pm for your follow up appt Contact information: [address] [place] [address] [phone]          Discharge Instructions    AMB Referral to Cardiac Rehabilitation - Phase II    Complete by:  As directed    Diagnosis:   Coronary Stents Stable Angina     Amb Referral to Cardiac Rehabilitation    Complete by:  As directed    Diagnosis:  Coronary Stents   Call MD for:  redness, tenderness, or signs of infection (pain, swelling, redness, odor or green/yellow discharge around incision  site)    Complete by:  As directed    Diet - low sodium heart healthy    Complete by:  As directed    Discharge instructions    Complete by:  As directed    Radial Site Care Refer to this sheet in the next few weeks. These instructions provide you with information on caring for yourself after your procedure. Your caregiver may also give you more specific instructions. Your treatment has been planned according to current medical practices, but problems sometimes occur. Call your caregiver if you have any problems or questions after your procedure. HOME CARE INSTRUCTIONS You may shower the day after the procedure.Remove the bandage (dressing) and gently wash the site with plain soap and water.Gently pat the site dry.  Do not apply powder or lotion to the site.  Do not submerge the affected site in water for 3 to 5 days.  Inspect the site at least twice daily.  Do not flex or bend the affected arm for 24 hours.  No lifting over 5 pounds (2.3 kg) for 5 days after your  procedure.  Do not drive home if you are discharged the same day of the procedure. Have someone else drive you.  You may drive 24 hours after the procedure unless otherwise instructed by your caregiver.  What to expect: Any bruising will usually fade within 1 to 2 weeks.  Blood that collects in the tissue (hematoma) may be painful to the touch. It should usually decrease in size and tenderness within 1 to 2 weeks.  SEEK IMMEDIATE MEDICAL CARE IF: You have unusual pain at the radial site.  You have redness, warmth, swelling, or pain at the radial site.  You have drainage (other than a small amount of blood on the dressing).  You have chills.  You have a fever or persistent symptoms for more than 72 hours.  You have a fever and your symptoms suddenly get worse.  Your arm becomes pale, cool, tingly, or numb.  You have heavy bleeding from the site. Hold pressure on the site.   PLEASE DO NOT MISS ANY DOSES OF YOUR PLAVIX!!!!!  Also keep a log of you blood pressures and bring back to your follow up appt. Please call the office with any questions.   Patients taking blood thinners should generally stay away from medicines like ibuprofen, Advil, Motrin, naproxen, and Aleve due to risk of stomach bleeding. You may take Tylenol as directed or talk to your primary doctor about alternatives.   Increase activity slowly    Complete by:  As directed       Discharge Medications     Medication List    TAKE these medications   AIRBORNE Tbef Take 1 tablet by mouth daily as needed (for immune system support). Immune Health/Support   aspirin EC 81 MG tablet Take 81 mg by mouth daily.   atorvastatin 40 MG tablet Commonly known as:  LIPITOR Take 1 tablet (40 mg total) by mouth daily.   Calcium Carb-Cholecalciferol 500-600 MG-UNIT Tabs Take 1 tablet by mouth daily.   carvedilol 6.25 MG tablet Commonly known as:  COREG Take 1 tablet (6.25 mg total) by mouth 2 (two) times daily.   cetirizine 10 MG tablet Commonly known as:  ZYRTEC Take 20 mg by mouth daily.   clobetasol cream 0.05 % Commonly known as:  TEMOVATE Apply 1 application topically 2 (two) times daily. To affected areas What changed:  when to take this  reasons to take this  additional instructions   clopidogrel 75 MG tablet Commonly known as:  PLAVIX Take 1 tablet (75 mg total) by mouth daily.   glimepiride 4 MG tablet Commonly known as:  AMARYL TAKE TWO TABLETS BY MOUTH ONCE DAILY BEFORE BREAKFAST   glucose blood test strip Commonly known as:  ONE TOUCH ULTRA TEST Check blood sugar once daily and as directed Dx E11.319   HYDROcodone-acetaminophen 5-325 MG tablet Commonly known as:  NORCO/VICODIN Take 1 tablet by mouth every 6 (six) hours as needed for moderate pain.   ibuprofen 200 MG tablet Commonly known as:  ADVIL,MOTRIN Take 200-800 mg by mouth every 8 (eight) hours as needed (for pain.).   LANCETS ULTRA THIN 30G Misc by Does  not apply route. One Touch   metFORMIN 1000 MG tablet Commonly known as:  GLUCOPHAGE TAKE ONE TABLET BY MOUTH TWICE DAILY   multivitamin with minerals Tabs tablet Take 1 tablet by mouth daily.   NASACORT ALLERGY 24HR NA Place 2 sprays into the nose at bedtime.   nystatin cream Commonly known as:  MYCOSTATIN Apply 1 application  topically 2 (two) times daily as needed for dry skin.   predniSONE 10 MG tablet Commonly known as:  DELTASONE Take 1 tablet (10 mg total) by mouth daily. What changed:  how much to take  when to take this  reasons to take this   spironolactone 25 MG tablet Commonly known as:  ALDACTONE TAKE ONE TABLET BY MOUTH ONCE DAILY   telmisartan 80 MG tablet Commonly known as:  MICARDIS TAKE 1 TABLET BY MOUTH ONCE DAILY   tetrahydrozoline 0.05 % ophthalmic solution Place 1-2 drops into both eyes 3 (three) times daily as needed (for dry/irritated eyes).        Aspirin prescribed at discharge?  Yes High Intensity Statin Prescribed? (Lipitor 40-80mg  or Crestor 20-40mg ): Yes Beta Blocker Prescribed? Yes For EF <40%, was ACEI/ARB Prescribed? Yes ADP Receptor Inhibitor Prescribed? (i.e. Plavix etc.-Includes Medically Managed Patients): Yes For EF <40%, Aldosterone Inhibitor Prescribed? Yes Was EF assessed during THIS hospitalization? Yes Was Cardiac Rehab II ordered? (Included Medically managed Patients): Yes   Outstanding Labs/Studies   N/a  Duration of Discharge Encounter   Greater than 30 minutes including physician time.  Signed, Reino Bellis NP-C 03/29/2017, 5:04 PM

## 2017-03-29 NOTE — Telephone Encounter (Signed)
tcm ph for Cath needs 7-10 days from 10/31  Scheduled next available with Christell Faith 11/21 at 2 pm added to waitlist

## 2017-03-29 NOTE — Interval H&P Note (Signed)
Cath Lab Visit (complete for each Cath Lab visit)  Clinical Evaluation Leading to the Procedure:   ACS: No.  Non-ACS:    Anginal Classification: CCS III  Anti-ischemic medical therapy: Minimal Therapy (1 class of medications)  Non-Invasive Test Results: High-risk stress test findings: cardiac mortality >3%/year  Prior CABG: No previous CABG      History and Physical Interval Note:  03/29/2017 11:57 AM  Ryan Morgan  has presented today for surgery, with the diagnosis of cad, abnormal stress test, cp  The various methods of treatment have been discussed with the patient and family. After consideration of risks, benefits and other options for treatment, the patient has consented to  Procedure(s): LEFT HEART CATH AND CORONARY ANGIOGRAPHY (N/A) as a surgical intervention .  The patient's history has been reviewed, patient examined, no change in status, stable for surgery.  I have reviewed the patient's chart and labs.  Questions were answered to the patient's satisfaction.     Kathlyn [place]

## 2017-03-29 NOTE — Telephone Encounter (Signed)
Patient currently admitted at this time. 

## 2017-03-29 NOTE — Discharge Instructions (Signed)
Resume Metformin after 2 days.  Start Plavix 75 mg once daily starting tomorrow.  Radial Site Care Refer to this sheet in the next few weeks. These instructions provide you with information about caring for yourself after your procedure. Your health care provider may also give you more specific instructions. Your treatment has been planned according to current medical practices, but problems sometimes occur. Call your health care provider if you have any problems or questions after your procedure. What can I expect after the procedure? After your procedure, it is typical to have the following:  Bruising at the radial site that usually fades within 1-2 weeks.  Blood collecting in the tissue (hematoma) that may be painful to the touch. It should usually decrease in size and tenderness within 1-2 weeks.  Follow these instructions at home:  Take medicines only as directed by your health care provider.  You may shower 24-48 hours after the procedure or as directed by your health care provider. Remove the bandage (dressing) and gently wash the site with plain soap and water. Pat the area dry with a clean towel. Do not rub the site, because this may cause bleeding.  Do not take baths, swim, or use a hot tub until your health care provider approves.  Check your insertion site every day for redness, swelling, or drainage.  Do not apply powder or lotion to the site.  Do not flex or bend the affected arm for 24 hours or as directed by your health care provider.  Do not push or pull heavy objects with the affected arm for 24 hours or as directed by your health care provider.  Do not lift over 10 lb (4.5 kg) for 5 days after your procedure or as directed by your health care provider.  Ask your health care provider when it is okay to: ? Return to work or school. ? Resume usual physical activities or sports. ? Resume sexual activity.  Do not drive home if you are discharged the same day as the  procedure. Have someone else drive you.  You may drive 24 hours after the procedure unless otherwise instructed by your health care provider.  Do not operate machinery or power tools for 24 hours after the procedure.  If your procedure was done as an outpatient procedure, which means that you went home the same day as your procedure, a responsible adult should be with you for the first 24 hours after you arrive home.  Keep all follow-up visits as directed by your health care provider. This is important. Contact a health care provider if:  You have a fever.  You have chills.  You have increased bleeding from the radial site. Hold pressure on the site. CALL 911 Get help right away if:  You have unusual pain at the radial site.  You have redness, warmth, or swelling at the radial site.  You have drainage (other than a small amount of blood on the dressing) from the radial site.  The radial site is bleeding, and the bleeding does not stop after 30 minutes of holding steady pressure on the site.  Your arm or hand becomes pale, cool, tingly, or numb. This information is not intended to replace advice given to you by your health care provider. Make sure you discuss any questions you have with your health care provider. Document Released: 06/18/2010 Document Revised: 10/22/2015 Document Reviewed: 12/02/2013 Elsevier Interactive Patient Education  2018 [place].  Moderate Conscious Sedation, Adult, Care After These instructions  provide you with information about caring for yourself after your procedure. Your health care provider may also give you more specific instructions. Your treatment has been planned according to current medical practices, but problems sometimes occur. Call your health care provider if you have any problems or questions after your procedure. What can I expect after the procedure? After your procedure, it is common:  To feel sleepy for several hours.  To feel  clumsy and have poor balance for several hours.  To have poor judgment for several hours.  To vomit if you eat too soon.  Follow these instructions at home: For at least 24 hours after the procedure:   Do not: ? Participate in activities where you could fall or become injured. ? Drive. ? Use heavy machinery. ? Drink alcohol. ? Take sleeping pills or medicines that cause drowsiness. ? Make important decisions or sign legal documents. ? Take care of children on your own.  Rest. Eating and drinking  Follow the diet recommended by your health care provider.  If you vomit: ? Drink water, juice, or soup when you can drink without vomiting. ? Make sure you have little or no nausea before eating solid foods. General instructions  Have a responsible adult stay with you until you are awake and alert.  Take over-the-counter and prescription medicines only as told by your health care provider.  If you smoke, do not smoke without supervision.  Keep all follow-up visits as told by your health care provider. This is important. Contact a health care provider if:  You keep feeling nauseous or you keep vomiting.  You feel light-headed.  You develop a rash.  You have a fever. Get help right away if:  You have trouble breathing. This information is not intended to replace advice given to you by your health care provider. Make sure you discuss any questions you have with your health care provider. Document Released: 03/06/2013 Document Revised: 10/19/2015 Document Reviewed: 09/05/2015 Elsevier Interactive Patient Education  [employer].

## 2017-03-29 NOTE — Progress Notes (Signed)
CARDIAC REHAB PHASE I   Completed ed with pt and family at beside. Reviewed stent card, PTCA/Stent procedure, restrictions, heart failure booklet, risk factors, diabetic/heart healthy diet, anti-platelet therapy, NTG, exercise guidelines, CRPII. Pt and family voiced understanding of edcuation. Will refer to [place].   [id number]  Carma Lair MS, ACSM CEP  3:06 PM 03/29/2017

## 2017-03-29 NOTE — H&P (View-Only) (Signed)
Patient ID: Ryan Morgan, male   DOB: November 28, 1940, 76 y.o.   MRN: [id number] Cardiology Office Note  Date:  03/23/2017   ID:  Ryan Morgan, DOB 10-17-1940, MRN [id number]  PCP:  Abner Greenspan, MD   Chief Complaint  Patient presents with  . other    Follow up from [facility]. Meds reviewed by the pt. verbally.     HPI:  Ryan Morgan is a very pleasant 76 year old gentleman with a history of  DM,  smoking hx,  coronary artery disease,  stent placed to his LAD in 2000,  (Lad stent 3.0 x 18 in 2000) repeat catheterization in January 2003 with 40% LAD, 50% diagonal disease, history of Wegener's granulomatosis, on chronic steroids diabetes. Prior thoracotomy surgery on the left for lung nodule, chronic pain on the left that is episodic, stable over several years Chronic shortness of breath, sedentary lifestyle, obesity/deconditioning who presents for follow up of his CAD and shortness of breath  In follow-up today he presents to discuss his shortness of breath symptoms, fatigue, chest tightness  We discussed his recent echocardiogram showing ejection fraction 35% with large region of anterior wall hypokinesis Region of hypokinesis possibly secondary to prior anterior wall MI in 2000 at which time stent was placed No prior echocardiogram for comparison  Also discussed his stress test He has fixed anterior , anteroseptal wall defect consistent with previous MI  Also with inferior wall decreased perfusion with component of  Ischemia  raising the concern of RCA disease Effusion in the lateral wall appears to be preserved Ejection fraction is depressed Images pulled up in the office and discussed with him in detail  Recently seen by Dr. Lake Bells He is being evaluated for shortness of breath  He does not exercise, weight has trended upwards, Very sedentary lifestyle  Previously declined workup including stress test and echocardiogram until recently  Lab work reviewed with him Total  chol 117 LDL 46 HBA1C 8.6, weight has been trending up  Previous EKG Shows no sinus rhythm rate 79 bpm old inferior MI, no change compared to EKG January 2017  Other past medical history reviewed Stress test from January 2009 was a treadmill study, ejection fraction estimated at 45%, with medium-sized area of moderate to severe hypoperfusion involving the septal region.     PMH:   has a past medical history of Coronary artery disease; Diabetes mellitus; Heart disease; Hyperlipidemia; Hypertension; and Wegener's granulomatosis ([place]) (2007).  PSH:    Past Surgical History:  Procedure Laterality Date  . BASAL CELL CARCINOMA EXCISION     removed from face x 3  . CARDIAC CATHETERIZATION    . CARDIAC SURGERY  2000   stent in heart  . CORONARY ANGIOPLASTY  09/22/1998   s/p stent placement; [facility] 3.0 x 18 mm Ref [id number]  . LUNG SURGERY     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    Current Outpatient Prescriptions  Medication Sig Dispense Refill  . amLODipine (NORVASC) 10 MG tablet Take 1 tablet (10 mg total) by mouth daily. 90 tablet 3  . aspirin 81 MG tablet Take 81 mg by mouth daily.     Marland Kitchen atorvastatin (LIPITOR) 40 MG tablet Take 1 tablet (40 mg total) by mouth daily. 90 tablet 3  . calcium carbonate 1250 MG capsule Take 1,250 mg by mouth daily.    . cetirizine (ZYRTEC) 10 MG tablet Take 10 mg by mouth 2 (two) times daily.    . cholecalciferol (VITAMIN D) 1000  units tablet Take 1,000 Units by mouth daily.    . clobetasol cream (TEMOVATE) 4.16 % Apply 1 application topically 2 (two) times daily. To affected areas (Patient taking differently: Apply 1 application topically 2 (two) times daily as needed. To affected areas) 30 g 1  . glimepiride (AMARYL) 4 MG tablet TAKE TWO TABLETS BY MOUTH ONCE DAILY BEFORE BREAKFAST 180 tablet 2  . glucose blood (ONE TOUCH ULTRA TEST) test strip Check blood sugar once daily and as directed Dx E11.319 100 each 1  . hydrOXYzine (ATARAX/VISTARIL)  25 MG tablet TAKE ONE TABLET BY MOUTH THREE TIMES DAILY AS NEEDED FOR ITCHING (CAUTION OF SEDATION) 30 tablet 1  . LANCETS ULTRA THIN 30G MISC by Does not apply route. One Touch    . metFORMIN (GLUCOPHAGE) 1000 MG tablet TAKE ONE TABLET BY MOUTH TWICE DAILY 180 tablet 1  . Multiple Vitamin (MULTIVITAMIN) capsule Take 1 capsule by mouth daily.    Marland Kitchen nystatin cream (MYCOSTATIN) Apply 1 application topically 2 (two) times daily as needed for dry skin.    . predniSONE (DELTASONE) 10 MG tablet Take 1 tablet (10 mg total) by mouth daily. 30 tablet 0  . spironolactone (ALDACTONE) 25 MG tablet TAKE ONE TABLET BY MOUTH ONCE DAILY 90 tablet 1  . telmisartan (MICARDIS) 80 MG tablet TAKE 1 TABLET BY MOUTH ONCE DAILY 90 tablet 0  . Triamcinolone Acetonide (NASACORT ALLERGY 24HR NA) Place 2 sprays into the nose at bedtime.      No current facility-administered medications for this visit.      Allergies:   Benicar hct [olmesartan medoxomil-hctz]; Bystolic [nebivolol hcl]; Niaspan [niacin er]; and Septra [bactrim]   Social History:  The patient  reports that he quit smoking about 38 years ago. His smoking use included Cigarettes. He has a 10.00 pack-year smoking history. He has never used smokeless tobacco. He reports that he does not drink alcohol or use drugs.   Family History:   family history includes Cancer in his mother; Stroke in his maternal grandfather.    Review of Systems: Review of Systems  Constitutional: Positive for malaise/fatigue.  Respiratory: Positive for shortness of breath.   Cardiovascular: Positive for chest pain.  Gastrointestinal: Negative.   Musculoskeletal: Negative.   Neurological: Negative.   Psychiatric/Behavioral: Negative.   All other systems reviewed and are negative.    PHYSICAL EXAM: VS:  BP 128/70 (BP Location: Left Arm, Patient Position: Sitting, Cuff Size: Normal)   Pulse 78   Ht 5\' 11"  (1.803 m)   Wt 248 lb (112.5 kg)   BMI 34.59 kg/m  , BMI Body mass  index is 34.59 kg/m. No significant change compared to previous office visit GEN: Well nourished, well developed, in no acute distress, obese  HEENT: normal  Neck: no JVD, carotid bruits, or masses Cardiac: RRR; no murmurs, rubs, or gallops,no edema  Respiratory:  clear to auscultation bilaterally, normal work of breathing GI: soft, nontender, nondistended, + BS MS: no deformity or atrophy  Skin: warm and dry, no rash Neuro:  Strength and sensation are intact Psych: euthymic mood, full affect    Recent Labs: 11/10/2016: Hemoglobin 12.7; Platelets 204.0; TSH 1.64 02/14/2017: ALT 27; BUN 16; Creatinine, Ser 1.00; Potassium 4.2; Sodium 141    Lipid Panel Lab Results  Component Value Date   CHOL 117 11/10/2016   HDL 32.20 (L) 11/10/2016   LDLCALC 46 11/10/2016   TRIG 196.0 (H) 11/10/2016      Wt Readings from Last 3 Encounters:  03/23/17  248 lb (112.5 kg)  03/14/17 249 lb 6.4 oz (113.1 kg)  12/30/16 247 lb (112 kg)       ASSESSMENT AND PLAN:  Atherosclerosis of native coronary artery of native heart without angina pectoris - He reports having chest pain when walking outside, worsening shortness of breath Long discussion concerning recent echocardiogram and stress test results Prior anterior wall MI 2000 Now with inferior wall perfusion defect, component of ischemia Ejection fraction appears lower than previous estimates by stress tests in 2009 was 45%, now 30-35% Long discussion concerning various treatment options, recommended cardiac catheterization given his symptoms.  I have reviewed the risks, indications, and alternatives to cardiac catheterization, possible angioplasty, and stenting with the patient. Risks include but are not limited to bleeding, infection, vascular injury, stroke, myocardial infection, arrhythmia, kidney injury, radiation-related injury in the case of prolonged fluoroscopy use, emergency cardiac surgery, and death. The patient understands the risks  of serious complication is 1-2 in 6381 with diagnostic cardiac cath and 1-2% or less with angioplasty/stenting.  He is willing to proceed with cardiac catheterization in [facility] next Wednesday with Dr. Fletcher Anon.  Continue aspirin, statin, We will recommend he start carvedilol  Cardiomyopathy He is on ARB, spironolactone Currently not on a beta-blocker.  He had intolerance of Bystolic He does have underlying lung disease We will need to hold his amlodipine.  We will start carvedilol 6.25 mg twice daily  Essential hypertension Consider changes as above He is concerned rash on his hands is from the ARB.  He will hold this for a short period of time He reports having similar problems on Benicar  Hyperlipemia Cholesterol is at goal on the current lipid regimen. No changes to the medications were made.  Shortness of breath obesity, deconditioning Unable to exclude component of Wegener's. Recent stress test showing ischemia and moderately depressed ejection fraction on echocardiogram  Morbid Obesity We have encouraged continued exercise, careful diet management in an effort to lose weight.  Type 2 diabetes mellitus with retinopathy without macular edema, without long-term current use of insulin, unspecified laterality, unspecified retinopathy severity ([place]) Tremendous weight gain over the past several years, sedentary lifestyle, no regular exercise program.  He would likely benefit from cardiac rehab  Will wait on placing referral until after his cardiac catheterization  Disposition:   F/U  3 months   Total encounter time more than 60 minutes  Greater than 50% was spent in counseling and coordination of care with the patient    No orders of the defined types were placed in this encounter.    Signed, Esmond Plants, M.D., Ph.D. 03/23/2017  [place], [place]

## 2017-03-30 ENCOUNTER — Encounter (HOSPITAL_COMMUNITY): Payer: Self-pay | Admitting: Cardiovascular Disease

## 2017-03-30 NOTE — Telephone Encounter (Signed)
Patient contacted regarding discharge from [facility] on 03/29/17.   Patient understands to follow up with provider ? On 04/19/17 at 2 pm at [facility].  Patient understands discharge instructions? Yes   Patient understands medications and regiment? Yes  Patient understands to bring all medications to this visit? Yes

## 2017-03-31 ENCOUNTER — Telehealth: Payer: Self-pay | Admitting: Cardiovascular Disease

## 2017-03-31 ENCOUNTER — Telehealth (HOSPITAL_COMMUNITY): Payer: Self-pay

## 2017-03-31 NOTE — Telephone Encounter (Signed)
Patients insurance is active and benefits verified with [employer] - $45.00 co-pay, no deductible, out of pocket amount is $5,900/$797.18 has been met, no co-insurance, and no pre-authorization is required. Passport/reference [phone]

## 2017-03-31 NOTE — Telephone Encounter (Signed)
Spoke with patient and he has a new rash on his right hip. He is currently taking the Zyrtec, benadryl, prednisone, and a cream as well. He reports that he was taken off of telmisartan and there was no change in his itching/rash. He states that when he held all of his medications for his procedure that it did get some better but then he took them all again the next day and the rash returned with itching. Advised him to continue to his current treatments and that he needs to reach out to his PCP or dermatologist for further evaluation. Reviewed symptoms to monitor for that would require emergency evaluation and he verbalized understanding.

## 2017-03-31 NOTE — Telephone Encounter (Signed)
Pt wife calling office Pt had a stint placed recently Since Pt has developed a rash on his hip the size of a palm Pt would really like to speak with Dr Rockey Situ Please call to discuss

## 2017-04-03 NOTE — Telephone Encounter (Signed)
Called and spoke with patient in regards to Cardiac Rehab - Patient is not interested. He stated that he is out of town a lot and was thinking about joining the silver sneakers program at [employer]. Closed Referral. Paperwork in file cabinet.

## 2017-04-10 ENCOUNTER — Ambulatory Visit: Payer: Self-pay | Admitting: *Deleted

## 2017-04-10 ENCOUNTER — Telehealth: Payer: Self-pay

## 2017-04-10 NOTE — Telephone Encounter (Signed)
Copied from [facility] [phone]. Topic: Referral - Request >> Apr 10, 2017  1:18 PM Pricilla Handler wrote: Reason for CRM: Patient's wife called requesting a referral for an "allergist". Please contact patient and his wife ASAP. Patient's wife stated that the patient needs relief.

## 2017-04-10 NOTE — Telephone Encounter (Signed)
I have a rash on my palms and sometimes my fingers that has been ongoing for 2 years.  I think it came from taking Benicar.    Reason for Disposition . Localized rash present > 7 days  Answer Assessment - Initial Assessment Questions 1. APPEARANCE of RASH: "Describe the rash."      Rash in palms of hands sometimes on fingers.  Starts as round red spot and starts swelling. 2. LOCATION: "Where is the rash located?"      Palms 3. NUMBER: "How many spots are there?"      No 4. SIZE: "How big are the spots?" (Inches, centimeters or compare to size of a coin)      Size of little fingernail shaped like a triangle 5. ONSET: "When did the rash start?"      2 years ago.  It's ongoing. 6. ITCHING: "Does the rash itch?" If so, ask: "How bad is the itch?"  (Scale 1-10; or mild, moderate, severe)     Itches like crazy.    Now coated in Calamine lotion that helps. 7. PAIN: "Does the rash hurt?" If so, ask: "How bad is the pain?"  (Scale 1-10; or mild, moderate, severe)     No now 8. OTHER SYMPTOMS: "Do you have any other symptoms?" (e.g., fever)     No 9. PREGNANCY: "Is there any chance you are pregnant?" "When was your last menstrual period?"     N/A  Protocols used: RASH OR REDNESS - LOCALIZED-A-AH

## 2017-04-10 NOTE — Telephone Encounter (Signed)
Called pt to get additional info and no answer and no V/M.

## 2017-04-13 NOTE — Telephone Encounter (Signed)
Triage nurse Triaged pt and scheduled an appt with Dr. Darnell Level

## 2017-04-19 ENCOUNTER — Ambulatory Visit: Payer: Medicare HMO | Admitting: Physician Assistant

## 2017-04-19 ENCOUNTER — Encounter: Payer: Self-pay | Admitting: Physician Assistant

## 2017-04-19 ENCOUNTER — Ambulatory Visit: Payer: Medicare HMO | Admitting: Family Medicine

## 2017-04-19 VITALS — BP 136/70 | HR 66 | Ht 70.5 in | Wt 246.2 lb

## 2017-04-19 VITALS — BP 120/64 | HR 71 | Temp 97.6°F | Wt 246.8 lb

## 2017-04-19 DIAGNOSIS — I255 Ischemic cardiomyopathy: Secondary | ICD-10-CM

## 2017-04-19 DIAGNOSIS — I10 Essential (primary) hypertension: Secondary | ICD-10-CM

## 2017-04-19 DIAGNOSIS — R21 Rash and other nonspecific skin eruption: Secondary | ICD-10-CM | POA: Diagnosis not present

## 2017-04-19 DIAGNOSIS — E11319 Type 2 diabetes mellitus with unspecified diabetic retinopathy without macular edema: Secondary | ICD-10-CM

## 2017-04-19 DIAGNOSIS — Z7952 Long term (current) use of systemic steroids: Secondary | ICD-10-CM

## 2017-04-19 DIAGNOSIS — I2510 Atherosclerotic heart disease of native coronary artery without angina pectoris: Secondary | ICD-10-CM | POA: Diagnosis not present

## 2017-04-19 DIAGNOSIS — E782 Mixed hyperlipidemia: Secondary | ICD-10-CM

## 2017-04-19 DIAGNOSIS — M3130 Wegener's granulomatosis without renal involvement: Secondary | ICD-10-CM | POA: Diagnosis not present

## 2017-04-19 MED ORDER — EMPAGLIFLOZIN 10 MG PO TABS
10.0000 mg | ORAL_TABLET | Freq: Every day | ORAL | 1 refills | Status: DC
Start: 2017-04-19 — End: 2017-04-26

## 2017-04-19 MED ORDER — PREDNISONE 10 MG PO TABS: 10.0000 mg | ORAL_TABLET | Freq: Every day | ORAL | 0 refills | Status: DC | PRN

## 2017-04-19 MED ORDER — CLOBETASOL PROPIONATE 0.05 % EX CREA: 1.0000 "application " | TOPICAL_CREAM | Freq: Two times a day (BID) | CUTANEOUS | 1 refills | Status: AC

## 2017-04-19 NOTE — Patient Instructions (Addendum)
Medication Instructions:  Your physician recommends that you continue on your current medications as directed. Please refer to the Current Medication list given to you today.   Labwork: BMET and CBC today  Testing/Procedures: Your physician has requested that you have an echocardiogram at the end of February or the first of March. Echocardiography is a painless test that uses sound waves to create images of your heart. It provides your doctor with information about the size and shape of your heart and how well your heart's chambers and valves are working. This procedure takes approximately one hour. There are no restrictions for this procedure.    Follow-Up: Your physician wants you to follow-up in: March 2019 with Dr. Rockey Situ.  You will receive a reminder letter in the mail two months in advance. If you don't receive a letter, please call our office to schedule the follow-up appointment.   Any Other Special Instructions Will Be Listed Below (If Applicable).     If you need a refill on your cardiac medications before your next appointment, please call your pharmacy.  Echocardiogram An echocardiogram, or echocardiography, uses sound waves (ultrasound) to produce an image of your heart. The echocardiogram is simple, painless, obtained within a short period of time, and offers valuable information to your health care provider. The images from an echocardiogram can provide information such as:  Evidence of coronary artery disease (CAD).  Heart size.  Heart muscle function.  Heart valve function.  Aneurysm detection.  Evidence of a past heart attack.  Fluid buildup around the heart.  Heart muscle thickening.  Assess heart valve function.  Tell a health care provider about:  Any allergies you have.  All medicines you are taking, including vitamins, herbs, eye drops, creams, and over-the-counter medicines.  Any problems you or family members have had with anesthetic  medicines.  Any blood disorders you have.  Any surgeries you have had.  Any medical conditions you have.  Whether you are pregnant or may be pregnant. What happens before the procedure? No special preparation is needed. Eat and drink normally. What happens during the procedure?  In order to produce an image of your heart, gel will be applied to your chest and a wand-like tool (transducer) will be moved over your chest. The gel will help transmit the sound waves from the transducer. The sound waves will harmlessly bounce off your heart to allow the heart images to be captured in real-time motion. These images will then be recorded.  You may need an IV to receive a medicine that improves the quality of the pictures. What happens after the procedure? You may return to your normal schedule including diet, activities, and medicines, unless your health care provider tells you otherwise. This information is not intended to replace advice given to you by your health care provider. Make sure you discuss any questions you have with your health care provider. Document Released: 05/13/2000 Document Revised: 01/02/2016 Document Reviewed: 01/21/2013 Elsevier Interactive Patient Education  2017 [employer].

## 2017-04-19 NOTE — Assessment & Plan Note (Signed)
A1c uncontrolled.  rec trial of jardiance in place of amaryl.  Jardiance sent to pharmacy to price out - coupon provided today (may not be eligible due to medicare).

## 2017-04-19 NOTE — Assessment & Plan Note (Signed)
Back on daily prednisone due to recurrent rash.

## 2017-04-19 NOTE — Assessment & Plan Note (Addendum)
Unclear cause. S/p prior unrevealing evals including biopsy by derm. ?wegener related vs ?drug rash. Possible rash prior when on benicar HCT. No improvement off telmisartan. Will trial lower amaryl then off amaryl to see if any improvement. See below.

## 2017-04-19 NOTE — Patient Instructions (Addendum)
Decrease glimepiride to 4mg  with breakfast. If no improvement, trial off glimepiride. Do this over the next month.  Start new diabetes medicine jardiance daily. Price out at pharmacy.

## 2017-04-19 NOTE — Progress Notes (Signed)
BP 120/64 (BP Location: Left Arm, Patient Position: Sitting, Cuff Size: Large)   Pulse 71   Temp 97.6 F (36.4 C) (Oral)   Wt 246 lb 12 oz (111.9 kg)   SpO2 97%   BMI 34.41 kg/m    CC: skin rash Subjective:    Patient ID: Ryan Morgan, male    DOB: 15-Mar-1941, 76 y.o.   MRN: [id number]  HPI: Ryan Morgan is a 76 y.o. male presenting on 04/19/2017 for Medication Reaction (Had skin lesion in palm of hands yrs ago from [employer].  Had stent put in a few weeks ago, stopped meds. Lesions came back after resuming meds after surgery along with itching. Has had bx done.)   2 months ago had 3 spots develop on left palm - treated with prednisone, benadryl, zyrtec, calomine lotion. Also using clobetasol. Similar rash started after he took benicar HCT. Recent stent surgery 2 wks ago. Had to be off medications for a day prior to stent surgery and rash was improved, but has returned since restarting medications. Taking prednisone as well as other meds daily for last 2 wks.   Cards changed telmisartan to carvedilol 2 wks ago without significant improvement.  Saw dermatologist 4 wks ago s/o unrevealing skin biopsy - ?hives, ?wegener's related. Known wegener's granulomatosis on prednisone 10mg  daily - sometimes takes 20mg .  Requests prednisone and clobetasol refilled.    Diabetic on amaryl 4mg  bid and metformin 1000mg  bid.  Lab Results  Component Value Date   HGBA1C 8.8 (H) 02/14/2017     Relevant past medical, surgical, family and social history reviewed and updated as indicated. Interim medical history since our last visit reviewed. Allergies and medications reviewed and updated. Outpatient Medications Prior to Visit  Medication Sig Dispense Refill  . aspirin EC 81 MG tablet Take 81 mg by mouth daily.    Marland Kitchen atorvastatin (LIPITOR) 40 MG tablet Take 1 tablet (40 mg total) by mouth daily. 90 tablet 3  . Calcium Carb-Cholecalciferol 500-600 MG-UNIT TABS Take 1 tablet by mouth daily.    .  carvedilol (COREG) 6.25 MG tablet Take 1 tablet (6.25 mg total) by mouth 2 (two) times daily. 180 tablet 3  . cetirizine (ZYRTEC) 10 MG tablet Take 20 mg by mouth daily.    . clopidogrel (PLAVIX) 75 MG tablet Take 1 tablet (75 mg total) by mouth daily. 30 tablet 0  . glimepiride (AMARYL) 4 MG tablet TAKE TWO TABLETS BY MOUTH ONCE DAILY BEFORE BREAKFAST 180 tablet 2  . glucose blood (ONE TOUCH ULTRA TEST) test strip Check blood sugar once daily and as directed Dx E11.319 100 each 1  . HYDROcodone-acetaminophen (NORCO/VICODIN) 5-325 MG tablet Take 1 tablet by mouth every 6 (six) hours as needed for moderate pain.    Marland Kitchen ibuprofen (ADVIL,MOTRIN) 200 MG tablet Take 200-800 mg by mouth every 8 (eight) hours as needed (for pain.).    Marland Kitchen LANCETS ULTRA THIN 30G MISC by Does not apply route. One Touch    . metFORMIN (GLUCOPHAGE) 1000 MG tablet TAKE ONE TABLET BY MOUTH TWICE DAILY 180 tablet 1  . Multiple Vitamin (MULTIVITAMIN WITH MINERALS) TABS tablet Take 1 tablet by mouth daily.    . Multiple Vitamins-Minerals (AIRBORNE) TBEF Take 1 tablet by mouth daily as needed (for immune system support). Immune Health/Support    . nystatin cream (MYCOSTATIN) Apply 1 application topically 2 (two) times daily as needed for dry skin.    Marland Kitchen spironolactone (ALDACTONE) 25 MG tablet TAKE ONE TABLET BY  MOUTH ONCE DAILY 90 tablet 1  . tetrahydrozoline 0.05 % ophthalmic solution Place 1-2 drops into both eyes 3 (three) times daily as needed (for dry/irritated eyes).    . Triamcinolone Acetonide (NASACORT ALLERGY 24HR NA) Place 2 sprays into the nose at bedtime.     . clobetasol cream (TEMOVATE) 6.07 % Apply 1 application topically 2 (two) times daily. To affected areas (Patient taking differently: Apply 1 application topically 2 (two) times daily as needed (for itchy skin.). To affected areas) 30 g 1  . predniSONE (DELTASONE) 10 MG tablet Take 1 tablet (10 mg total) by mouth daily. (Patient taking differently: Take 10-20 mg by  mouth daily as needed (for wagner's glandular mitosis--inflammation.). ) 30 tablet 0  . telmisartan (MICARDIS) 80 MG tablet TAKE 1 TABLET BY MOUTH ONCE DAILY 90 tablet 0   No facility-administered medications prior to visit.      Per HPI unless specifically indicated in ROS section below Review of Systems     Objective:    BP 120/64 (BP Location: Left Arm, Patient Position: Sitting, Cuff Size: Large)   Pulse 71   Temp 97.6 F (36.4 C) (Oral)   Wt 246 lb 12 oz (111.9 kg)   SpO2 97%   BMI 34.41 kg/m   Wt Readings from Last 3 Encounters:  04/19/17 246 lb 12 oz (111.9 kg)  03/29/17 248 lb (112.5 kg)  03/23/17 248 lb (112.5 kg)    Physical Exam  Constitutional: He appears well-developed and well-nourished. No distress.  Musculoskeletal: He exhibits no edema.  Skin: Skin is warm and dry. No rash noted. No erythema.  Healing papule L palm at site of prior biopsy Slight erythema R palm near base of thumb  Nursing note and vitals reviewed.  Results for orders placed or performed during the hospital encounter of 03/29/17  Glucose, capillary  Result Value Ref Range   Glucose-Capillary 176 (H) 65 - 99 mg/dL   Comment 1 Notify RN   Glucose, capillary  Result Value Ref Range   Glucose-Capillary 141 (H) 65 - 99 mg/dL  POCT Activated clotting time  Result Value Ref Range   Activated Clotting Time 257 seconds  CARDIAC CATHETERIZATION  Result Value Ref Range   Cath EF Quantitative 40 %   Lab Results  Component Value Date   HGBA1C 8.8 (H) 02/14/2017      Assessment & Plan:   Problem List Items Addressed This Visit    Current chronic use of systemic steroids   Diabetes mellitus type 2 with retinopathy ([facility])    A1c uncontrolled.  rec trial of jardiance in place of amaryl.  Jardiance sent to pharmacy to price out - coupon provided today (may not be eligible due to medicare).       Relevant Medications   empagliflozin (JARDIANCE) 10 MG TABS tablet   Rash of hands - Primary      Unclear cause. S/p prior unrevealing evals including biopsy by derm. ?wegener related vs ?drug rash. Possible rash prior when on benicar HCT. No improvement off telmisartan. Will trial lower amaryl then off amaryl to see if any improvement. See below.       Wegener's granulomatosis ([place])    Back on daily prednisone due to recurrent rash.           Follow up plan: No Follow-up on file.  Ria Bush, MD

## 2017-04-19 NOTE — Progress Notes (Signed)
Cardiology Office Note Date:  04/19/2017  Patient ID:  Ryan Morgan, Ryan Morgan 28-Oct-1940, MRN [id number] PCP:  Abner Greenspan, MD  Cardiologist:  Dr. Rockey Situ, MD    Chief Complaint: Follow up diagnostic LHC  History of Present Illness: Ryan Morgan is a 76 y.o. 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  LHC in 2003 showed 40% LAD stenosis and 50% diagonal stenosis. Nuclear stress test in 2009 showed a medium-sized, fixed septal defect that correlated withhis prior MI. no significant ischemia was noted. EF 45% with septal HK. He was seen back in 11/2016 with complaints of dyspnea and chest pain. He was advised to follow up with his pulmonologist. Could not rule out deconditioning and obesity playing a role at that time. Pulmonary workup showed a nonacute CXR and normal oxygen saturations. He then underwent TTE on 03/20/2017 that shwoed a newly reduced EF of 30-35%, diffuse HK, normal LV diastolic function parameters, mild AI/MR, RV systolic function normal, PASP normal. He underwent nuclear stress test on 03/20/2017 that shwoed a large defect of severe severity in the basal inferoseptal, basal inferior, mid inferoseptal, mid inferior and apical inferior location consistent with prior MI with significant peri-infarct ischemia, EF 36%. Overall, this was a high risk study. He was seen on 10/25 to discuss the above results and scheduled for outpatient diagnostic LHC. He also noted a rash on his hands at that time that he was concerned was related to his ARB. His ARB was held. His amlodipine was also stopped given his new cardiomyopathy. He underwent diagnostic LHC at [facility] on 03/29/2017 that showed left main normal, patent LAD stent with 40% ISR, D1 100% stenosed,  small OM1 90% stenosed, RPDA 100% stenosed and filled by collaterals from the distal LAD, ostial right posterior AV groove 80% stenosed which supplied large posterolateral branches. This correlated with the ischemia noted on the stress test. He underwent successful PCI/DES to the ostial right posterior AV groove artery with [place] 2.5 x 12 mm DES. The LV was mildly dilated. EF 40% with global HK. He was placed on DAPT for at least 6 months. The remaining CAD was left to be treated medically.  Discharge cardiac medications included: ASA 81 mg, Lipitor 40 mg, Coreg 6.25 mg bid, Plavix 75 mg, spironolactone 25 mg, and telmisartan 80 mg.   Labs: 03/24/2017: BUN 18, SCr 0.93, K+ 4.2, WBC 7.8, HGB 12.9, PLT 212. 02/14/17: A1c 8.8. 11/10/16: LDL 46.   He comes in doing well from a cardiac standpoint today. No further chest pain and much improved SOB. Reports he can now easily ambulate 1,000 feet without having to stop and rest, compared to prior to his PCI when he could ambulated only 150 feet before needed to stop and rest. He has not missed any doses of DAPT. No orthopnea, PND, early satiety, or lower extremity swelling. Planning to enroll in rehab at the [facility]. Regarding the palmer rash, it is significantly improved as of the evening of 11/20. He had not taken telmisartan since late October given palmer rash. Patient reported ~ 2 days after stopping telmisartan in late October the rash improved. However, 1 day after his PCI the rash returned on the left hand for several days before improving and migrating to the right hand.  This was all in the setting of not taking telmisartan. He reports this palmer rash has been waxing and waning for ~ 3 years with multiple inconclusive biopsies. BP at home has been running in the 858I to 502D systolic with heart rates in the 60s bpm. Weight stable.    Past Medical History:  Diagnosis Date  . Coronary artery disease    a. prior LAD stenting 2000; b. [place] 2003 LAD 40, D1 50; c.  Lexiscan 02/2017: large defect of severe severity in the basal inferoseptal, basal inferior, mid inferoseptal, mid inferior and apical inferior location, high risk; d. LHC 03/29/17: LM nl, patent LAD stent w/ 40% ISR, D1 100,  OM1 90, RPDA 100 w/ L-R collats, post atrio 80% s/p PCI/DES    . Diabetes mellitus   . Hyperlipidemia   . Hypertension   . Ischemic cardiomyopathy    a. TTE 02/2017: EF 30-35%, diffuse HK, normal LV diastolic function parameters, mild AI/MR, RV systolic function normal, PASP normal  . Lung nodule    a. s/p prior thoracotomy  . Morbid obesity ([place])   . Wegener's granulomatosis ([place]) 2007    Past Surgical History:  Procedure Laterality Date  . BASAL CELL CARCINOMA EXCISION     removed from face x 3  . CARDIAC CATHETERIZATION    . CARDIAC SURGERY  2000   stent in heart  . CORONARY ANGIOPLASTY  09/22/1998   s/p stent placement; Tristar 3.0 x 18 mm Ref [id number]  . CORONARY STENT INTERVENTION N/A 03/29/2017   Procedure: CORONARY STENT INTERVENTION;  Surgeon: Wellington Hampshire, MD;  Location: [place] [facility];  Service: Cardiovascular;  Laterality: N/A;  . LEFT HEART CATH AND CORONARY ANGIOGRAPHY N/A 03/29/2017   Procedure: LEFT HEART CATH AND CORONARY ANGIOGRAPHY;  Surgeon: Wellington Hampshire, MD;  Location: [place] [facility];  Service: Cardiovascular;  Laterality: N/A;  . 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 ULTRASOUND GUIDANCE FOR VASCULAR ACCESS  03/29/2017   Procedure: Ultrasound Guidance For Vascular Access;  Surgeon: Wellington Hampshire, MD;  Location: [place] [facility];  Service: Cardiovascular;;    Current Meds  Medication Sig  . aspirin EC 81 MG tablet Take 81 mg by mouth daily.  Marland Kitchen atorvastatin (LIPITOR) 40 MG tablet Take 1 tablet (40 mg total) by mouth daily.  . Calcium Carb-Cholecalciferol 500-600 MG-UNIT TABS Take 1 tablet by mouth daily.  . carvedilol (COREG) 6.25 MG tablet Take 1 tablet (6.25 mg total) by mouth 2 (two) times  daily.  . cetirizine (ZYRTEC) 10 MG tablet Take 20 mg by mouth daily.  . clobetasol cream (TEMOVATE) 6.76 % Apply 1 application topically 2 (two) times daily. To affected areas  . clopidogrel (PLAVIX) 75 MG tablet Take 1 tablet (75 mg total) by mouth daily.  . empagliflozin (JARDIANCE) 10 MG TABS tablet Take 10 mg by mouth daily.  Marland Kitchen glimepiride (AMARYL) 4 MG tablet TAKE TWO TABLETS BY MOUTH ONCE DAILY BEFORE BREAKFAST (Patient taking differently: TAKE ONE TABLETS BY MOUTH ONCE DAILY BEFORE BREAKFAST)  . glucose blood (ONE TOUCH ULTRA TEST) test strip Check blood sugar once daily and as directed Dx E11.319  . HYDROcodone-acetaminophen (NORCO/VICODIN) 5-325 MG tablet Take 1 tablet by mouth every 6 (six) hours as needed for moderate pain.  Marland Kitchen ibuprofen (ADVIL,MOTRIN) 200 MG tablet Take 200-800 mg by mouth every 8 (eight) hours as needed (for pain.).  Marland Kitchen LANCETS ULTRA THIN 30G MISC by Does not apply route. One  Touch  . metFORMIN (GLUCOPHAGE) 1000 MG tablet TAKE ONE TABLET BY MOUTH TWICE DAILY  . Multiple Vitamin (MULTIVITAMIN WITH MINERALS) TABS tablet Take 1 tablet by mouth daily.  . Multiple Vitamins-Minerals (AIRBORNE) TBEF Take 1 tablet by mouth daily as needed (for immune system support). Immune Health/Support  . nystatin cream (MYCOSTATIN) Apply 1 application topically 2 (two) times daily as needed for dry skin.  . predniSONE (DELTASONE) 10 MG tablet Take 1-2 tablets (10-20 mg total) by mouth daily as needed (for wegener's granulomatosis).  Marland Kitchen spironolactone (ALDACTONE) 25 MG tablet TAKE ONE TABLET BY MOUTH ONCE DAILY  . tetrahydrozoline 0.05 % ophthalmic solution Place 1-2 drops into both eyes 3 (three) times daily as needed (for dry/irritated eyes).  . Triamcinolone Acetonide (NASACORT ALLERGY 24HR NA) Place 2 sprays into the nose at bedtime.     Allergies:   Benicar hct [olmesartan medoxomil-hctz]; Bystolic [nebivolol hcl]; Lovaza [omega-3-acid ethyl esters]; Niaspan [niacin er]; and Septra  [bactrim]   Social History:  The patient  reports that he quit smoking about 38 years ago. His smoking use included cigarettes. He has a 10.00 pack-year smoking history. he has never used smokeless tobacco. He reports that he does not drink alcohol or use drugs.   Family History:  The patient's family history includes Cancer in his mother; Stroke in his maternal grandfather.  ROS:   Review of Systems  Constitutional: Negative for chills, diaphoresis, fever, malaise/fatigue and weight loss.  HENT: Negative for congestion.   Eyes: Negative for discharge and redness.  Respiratory: Negative for cough, hemoptysis, sputum production, shortness of breath and wheezing.   Cardiovascular: Negative for chest pain, palpitations, orthopnea, claudication, leg swelling and PND.  Gastrointestinal: Negative for abdominal pain, blood in stool, heartburn, melena, nausea and vomiting.  Genitourinary: Negative for hematuria.  Musculoskeletal: Negative for falls and myalgias.  Skin: Positive for itching and rash.       Improved  Neurological: Negative for dizziness, tingling, tremors, sensory change, speech change, focal weakness, loss of consciousness and weakness.  Endo/Heme/Allergies: Does not bruise/bleed easily.  Psychiatric/Behavioral: Negative for substance abuse. The patient is not nervous/anxious.   All other systems reviewed and are negative.    PHYSICAL EXAM:  VS:  BP 136/70 (BP Location: Left Arm, Patient Position: Sitting, Cuff Size: Normal)   Pulse 66   Ht 5' 10.5" (1.791 m)   Wt 246 lb 4 oz (111.7 kg)   BMI 34.83 kg/m  BMI: Body mass index is 34.83 kg/m.  Physical Exam  Constitutional: He is oriented to person, place, and time. He appears well-developed and well-nourished.  HENT:  Head: Normocephalic and atraumatic.  Eyes: Right eye exhibits no discharge. Left eye exhibits no discharge.  Neck: Normal range of motion. No JVD present.  Cardiovascular: Normal rate, regular rhythm, S1  normal, S2 normal and normal heart sounds. Exam reveals no distant heart sounds, no friction rub, no midsystolic click and no opening snap.  No murmur heard. Pulmonary/Chest: Effort normal and breath sounds normal. No respiratory distress. He has no decreased breath sounds. He has no wheezes. He has no rales. He exhibits no tenderness.  Abdominal: Soft. He exhibits no distension. There is no tenderness.  Musculoskeletal: He exhibits no edema.  Neurological: He is alert and oriented to person, place, and time.  Skin: Skin is warm and dry. No cyanosis. Nails show no clubbing.  Dry skin noted on the bilateral palmer surfaces of the hands. Otherwise, no lesion or erythema is noted.   Psychiatric: He  has a normal mood and affect. His speech is normal and behavior is normal. Judgment and thought content normal.     EKG:  Was ordered and interpreted by me today. Shows NSR, 67 bpm, LVH, prior inferior infarct  Recent Labs: 11/10/2016: TSH 1.64 02/14/2017: ALT 27 03/24/2017: BUN 18; Creatinine, Ser 0.93; Hemoglobin 12.9; Platelets 212; Potassium 4.2; Sodium 137  11/10/2016: Cholesterol 117; HDL 32.20; LDL Cholesterol 46; Total CHOL/HDL Ratio 4; Triglycerides 196.0; VLDL 39.2   CrCl cannot be calculated (Patient's most recent lab result is older than the maximum 21 days allowed.).   Wt Readings from Last 3 Encounters:  04/19/17 246 lb 4 oz (111.7 kg)  04/19/17 246 lb 12 oz (111.9 kg)  03/29/17 248 lb (112.5 kg)     Other studies reviewed: Additional studies/records reviewed today include: summarized above  ASSESSMENT AND PLAN:  1. CAD in native coronary artery without angina: No symptoms concerning for angina at this time. Continue DAPT with ASA 81 mg daily and Plavix 75 mg daily uninterrupted for at least the next 6 months, preferably 12 months. The residual CAD noted above will continue to be medically managed at this time given the small vessels and small anatomical myocardium these areas  supply. He prefers to do cardiac rehab through the [facility].   2. ICM: He does not appear volume overloaded at this time. Continue Coreg 6.25 mg bid and spironolactone 25 mg daily. I will not place him back on ARB therapy at this time given the possibility this has played some role in his rash. I will not have a trial of ACEi given his comorbid conditions. He prefers no titration of his Coreg at this time given his home BP readings have been in the 416S systolic. Given his heart rate of 66 bpm today, it is reasonable to keep his Coreg at the current dose. We will plan for a repeat echo in ~ 3 months to evaluate for improved LVSF. If his EF remains < 35% at that time he will require referral to EP for consideration of ICD placement. CHF education.   3. HTN: Blood pressure reasonably controlled today as above. Continue Coreg and spironolactone.   4. HLD: LDL in 10/2016 at goal as above. Goal LDL < 70. Continue Lipitor 40 mg daily. Followed by PCP.  5. Morbid obesity: Cardiac rehab as above.   6. Palmer rash: Of uncertain etiology. Has been waxing and waning for ~ 3 years with multiple unrevealing biopsies. Continue to hold off telmisartan for now. Rash is currently improved. Per PCP/dermatology.   Disposition: F/u with Dr. Rockey Situ in 3-4 months.   Current medicines are reviewed at length with the patient today.  The patient did not have any concerns regarding medicines.  Melvern Banker PA-C 04/19/2017 2:23 PM     [address] [place], [place] [address] [phone]

## 2017-04-20 LAB — CBC
Hematocrit: 36.9 % — ABNORMAL LOW (ref 37.5–51.0)
Hemoglobin: 12.7 g/dL — ABNORMAL LOW (ref 13.0–17.7)
MCH: 31.5 pg (ref 26.6–33.0)
MCHC: 34.4 g/dL (ref 31.5–35.7)
MCV: 92 fL (ref 79–97)
PLATELETS: 174 10*3/uL (ref 150–379)
RBC: 4.03 x10E6/uL — ABNORMAL LOW (ref 4.14–5.80)
RDW: 14 % (ref 12.3–15.4)
WBC: 4.6 10*3/uL (ref 3.4–10.8)

## 2017-04-20 LAB — BASIC METABOLIC PANEL
BUN / CREAT RATIO: 20 (ref 10–24)
BUN: 16 mg/dL (ref 8–27)
CO2: 23 mmol/L (ref 20–29)
Calcium: 9.3 mg/dL (ref 8.6–10.2)
Chloride: 103 mmol/L (ref 96–106)
Creatinine, Ser: 0.79 mg/dL (ref 0.76–1.27)
GFR calc non Af Amer: 88 mL/min/{1.73_m2} (ref >59)
GFR, EST AFRICAN AMERICAN: 102 mL/min/{1.73_m2} (ref >59)
Glucose: 251 mg/dL — ABNORMAL HIGH (ref 65–99)
POTASSIUM: 4.4 mmol/L (ref 3.5–5.2)
SODIUM: 139 mmol/L (ref 134–144)

## 2017-04-25 ENCOUNTER — Telehealth: Payer: Self-pay | Admitting: Family Medicine

## 2017-04-25 NOTE — Telephone Encounter (Signed)
I did not receive request.  Please clarify - was this for clobetasol cream for rash or was it for jardiance diabetic medication?  If diabetic med, rec f/u with PCP to discuss alternatives. May need to discuss insulin vs restarting amaryl.  If clobetasol cream for rash we can try different med (I just refilled what he had previously been taking).   We stopped amaryl to see if rash would improve off this medication given prior sulfa allergy (hives).

## 2017-04-25 NOTE — Telephone Encounter (Signed)
Copied from [place]. Topic: Quick Communication - See Telephone Encounter >> Apr 25, 2017 11:08 AM Aurelio Brash B wrote: CRM for notification. See Telephone encounter for:  PT was seen last Wednesday for rash,  received rx for the rash ,  pt went to pick up meds at pharm  and the cost was $500,  the pharm was to contact the Drs office about an affordable alternative med, pt has not heard anything.  04/25/17. [facility] at [address]

## 2017-04-25 NOTE — Telephone Encounter (Signed)
Left message on vm per dpr relaying message per Dr. Darnell Level that pt needs to schedule follow up with PCP to discuss alternatives to [facility].

## 2017-04-25 NOTE — Telephone Encounter (Signed)
Spoke with [facility] and was told it is the [place] that is not covered by [employer].

## 2017-04-26 MED ORDER — DAPAGLIFLOZIN PROPANEDIOL 5 MG PO TABS: 5.0000 mg | ORAL_TABLET | Freq: Every day | ORAL | 1 refills | Status: DC

## 2017-04-26 NOTE — Telephone Encounter (Signed)
Attempted to contact pt to relay message per Dr. Darnell Level. No answer, no vm.

## 2017-04-26 NOTE — Addendum Note (Signed)
Addended by: Ria Bush on: 04/26/2017 09:47 AM   Modules accepted: Orders

## 2017-04-26 NOTE — Telephone Encounter (Signed)
Jardiance unaffordable. I've sent in [place] - please have him call pharmacy to see how much that will cost (see if any cheaper than jardiance).  I had suggested trial off amaryl for 1 month to see if any benefit in hand rash. If no improvement, will likely need to go back on sulfonylurea for sugar control.  He has f/u with Dr Glori Bickers late December. Will cc her as fyi.

## 2017-05-11 ENCOUNTER — Telehealth: Payer: Self-pay | Admitting: Family Medicine

## 2017-05-11 DIAGNOSIS — E11319 Type 2 diabetes mellitus with unspecified diabetic retinopathy without macular edema: Secondary | ICD-10-CM

## 2017-05-11 DIAGNOSIS — E7800 Pure hypercholesterolemia, unspecified: Secondary | ICD-10-CM

## 2017-05-11 DIAGNOSIS — I10 Essential (primary) hypertension: Secondary | ICD-10-CM

## 2017-05-11 NOTE — Telephone Encounter (Signed)
-----   Message from Ellamae Sia sent at 05/11/2017  3:11 PM EST ----- Regarding: Lab orders for Wednesday, 12.19.18 Lab orders for a 3 month follow up appt.

## 2017-05-17 ENCOUNTER — Other Ambulatory Visit (INDEPENDENT_AMBULATORY_CARE_PROVIDER_SITE_OTHER): Payer: Medicare HMO

## 2017-05-17 DIAGNOSIS — E7800 Pure hypercholesterolemia, unspecified: Secondary | ICD-10-CM

## 2017-05-17 DIAGNOSIS — E11319 Type 2 diabetes mellitus with unspecified diabetic retinopathy without macular edema: Secondary | ICD-10-CM | POA: Diagnosis not present

## 2017-05-17 DIAGNOSIS — I10 Essential (primary) hypertension: Secondary | ICD-10-CM | POA: Diagnosis not present

## 2017-05-17 LAB — COMPREHENSIVE METABOLIC PANEL
ALK PHOS: 59 U/L (ref 39–117)
ALT: 23 U/L (ref 0–53)
AST: 19 U/L (ref 0–37)
Albumin: 3.7 g/dL (ref 3.5–5.2)
BUN: 14 mg/dL (ref 6–23)
CHLORIDE: 98 meq/L (ref 96–112)
CO2: 31 meq/L (ref 19–32)
Calcium: 8.8 mg/dL (ref 8.4–10.5)
Creatinine, Ser: 1.01 mg/dL (ref 0.40–1.50)
GFR: 76.58 mL/min (ref >60.00)
GLUCOSE: 391 mg/dL — AB (ref 70–99)
POTASSIUM: 4.2 meq/L (ref 3.5–5.1)
SODIUM: 134 meq/L — AB (ref 135–145)
TOTAL PROTEIN: 6.3 g/dL (ref 6.0–8.3)
Total Bilirubin: 0.6 mg/dL (ref 0.2–1.2)

## 2017-05-17 LAB — LIPID PANEL
CHOL/HDL RATIO: 5
Cholesterol: 165 mg/dL (ref 0–200)
HDL: 36.4 mg/dL — AB (ref >39.00)
NONHDL: 128.62
Triglycerides: 391 mg/dL — ABNORMAL HIGH (ref 0.0–149.0)
VLDL: 78.2 mg/dL — AB (ref 0.0–40.0)

## 2017-05-17 LAB — HEMOGLOBIN A1C

## 2017-05-17 LAB — LDL CHOLESTEROL, DIRECT: Direct LDL: 100 mg/dL

## 2017-05-19 ENCOUNTER — Ambulatory Visit: Payer: Medicare HMO | Admitting: Family Medicine

## 2017-05-19 ENCOUNTER — Encounter: Payer: Self-pay | Admitting: Family Medicine

## 2017-05-19 VITALS — BP 132/76 | HR 75 | Temp 97.6°F | Ht 70.5 in | Wt 244.2 lb

## 2017-05-19 DIAGNOSIS — E11319 Type 2 diabetes mellitus with unspecified diabetic retinopathy without macular edema: Secondary | ICD-10-CM | POA: Diagnosis not present

## 2017-05-19 DIAGNOSIS — I10 Essential (primary) hypertension: Secondary | ICD-10-CM | POA: Diagnosis not present

## 2017-05-19 DIAGNOSIS — R21 Rash and other nonspecific skin eruption: Secondary | ICD-10-CM | POA: Diagnosis not present

## 2017-05-19 DIAGNOSIS — E7800 Pure hypercholesterolemia, unspecified: Secondary | ICD-10-CM | POA: Diagnosis not present

## 2017-05-19 NOTE — Progress Notes (Signed)
Subjective:    Patient ID: Ryan Morgan, male    DOB: December 18, 1940, 76 y.o.   MRN: [id number]  HPI Here for f/u of chronic health problems   More rash problems lately -on his hands/intensely itchy Had another derm bx-did not show anything  Had cardiac stent put in and when off other meds - it came back   wegners- lung symptoms are better  Not on prednisone (but was in the past 2 mo)    Wt Readings from Last 3 Encounters:  05/19/17 244 lb 4 oz (110.8 kg)  04/19/17 246 lb 4 oz (111.7 kg)  04/19/17 246 lb 12 oz (111.9 kg)   34.55 kg/m   bp is stable today  No cp or palpitations or headaches or edema  No side effects to medicines  BP Readings from Last 3 Encounters:  05/19/17 132/76  04/19/17 136/70  04/19/17 120/64      Diabetes Home sugar results  DM diet - watching carbs and sweets  Exercise - walking and some running (experimenting with running)  Symptoms-is very thirsty  A1C last  Lab Results  Component Value Date   HGBA1C 10.4 Repeated and verified X2. (H) 05/17/2017  this is up from 8.8  Glucose was 391 Last visit with Dr Darnell Level- rec trial of jardiance in place of amaryl  Held all medicines for a while  Just started back on metformin- and his rash came back  Last eye exam -summer  Renal protection- is holding arb and and soon to start back    Hyperlipidemia Lab Results  Component Value Date   CHOL 165 05/17/2017   CHOL 117 11/10/2016   CHOL 130 10/09/2015   Lab Results  Component Value Date   HDL 36.40 (L) 05/17/2017   HDL 32.20 (L) 11/10/2016   HDL 30.40 (L) 10/09/2015   Lab Results  Component Value Date   LDLCALC 46 11/10/2016   LDLCALC 68 06/02/2014   LDLCALC 57 02/26/2013   Lab Results  Component Value Date   TRIG 391.0 (H) 05/17/2017   TRIG 196.0 (H) 11/10/2016   TRIG 211.0 (H) 10/09/2015   Lab Results  Component Value Date   CHOLHDL 5 05/17/2017   CHOLHDL 4 11/10/2016   CHOLHDL 4 10/09/2015   Lab Results  Component Value Date   LDLDIRECT 100.0 05/17/2017   LDLDIRECT 69.0 10/09/2015   LDLDIRECT 65.7 08/13/2012   Trig are up from high sugar    Patient Active Problem List   Diagnosis Date Noted  . Current chronic use of systemic steroids 11/15/2016  . Routine general medical examination at a health care facility 11/09/2015  . Hearing loss 11/09/2015  . History of colonic polyps 11/09/2015  . Right leg pain 10/30/2015  . Claudication of calf muscles ([place]) 10/13/2015  . Rash of hands 03/10/2015  . Colon cancer screening 06/09/2014  . Blepharitis, bilateral 06/02/2014  . Shortness of breath 03/10/2014  . Atypical chest pain 03/10/2014  . Benign paroxysmal positional vertigo 12/14/2013  . Bursitis of right hip 10/17/2013  . Coronary artery disease of native artery of native heart with stable angina pectoris ([place]) 03/19/2013  . Encounter for Medicare annual wellness exam 03/05/2013  . 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 Osteopenia 03/05/2013  . Obesity 07/15/2011  . Prostate cancer screening 07/10/2011  . Cough 12/23/2010  . Hypertension 08/30/2010  . Hyperlipemia 08/30/2010  . Diabetes mellitus type 2 with retinopathy ([place]) 08/30/2010  . Wegener's granulomatosis ([place]) 08/30/2010  Past Medical History:  Diagnosis Date  . Coronary artery disease    a. prior LAD stenting 2000; b. [place] 2003 LAD 40, D1 50; c. Lexiscan 02/2017: large defect of severe severity in the basal inferoseptal, basal inferior, mid inferoseptal, mid inferior and apical inferior location, high risk; d. LHC 03/29/17: LM nl, patent LAD stent w/ 40% ISR, D1 100,  OM1 90, RPDA 100 w/ L-R collats, post atrio 80% s/p PCI/DES    . Diabetes mellitus   . Hyperlipidemia   . Hypertension   . Ischemic cardiomyopathy    a. TTE 02/2017: EF 30-35%, diffuse HK, normal LV diastolic function parameters, mild AI/MR, RV systolic function normal, PASP normal  . Lung nodule    a. s/p prior thoracotomy  . Morbid obesity ([place])    . Wegener's granulomatosis ([place]) 2007   Past Surgical History:  Procedure Laterality Date  . BASAL CELL CARCINOMA EXCISION     removed from face x 3  . CARDIAC CATHETERIZATION    . CARDIAC SURGERY  2000   stent in heart  . CORONARY ANGIOPLASTY  09/22/1998   s/p stent placement; [facility] 3.0 x 18 mm Ref [id number]  . CORONARY STENT INTERVENTION N/A 03/29/2017   Procedure: CORONARY STENT INTERVENTION;  Surgeon: Wellington Hampshire, MD;  Location: [facility];  Service: Cardiovascular;  Laterality: N/A;  . LEFT HEART CATH AND CORONARY ANGIOGRAPHY N/A 03/29/2017   Procedure: LEFT HEART CATH AND CORONARY ANGIOGRAPHY;  Surgeon: Wellington Hampshire, MD;  Location: [facility];  Service: Cardiovascular;  Laterality: N/A;  . 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 ULTRASOUND GUIDANCE FOR VASCULAR ACCESS  03/29/2017   Procedure: Ultrasound Guidance For Vascular Access;  Surgeon: Wellington Hampshire, MD;  Location: [facility];  Service: Cardiovascular;;   Social History   Tobacco Use  . Smoking status: Former Smoker    Packs/day: 0.50    Years: 20.00    Pack years: 10.00    Types: Cigarettes    Last attempt to quit: 05/30/1978    Years since quitting: 39.0  . Smokeless tobacco: Never Used  Substance Use Topics  . Alcohol use: No    Alcohol/week: 0.0 oz  . Drug use: No   Family History  Problem Relation Age of Onset  . Cancer Mother        tumor on tonsil  . Stroke Maternal Grandfather    Allergies  Allergen Reactions  . Benicar Hct [Olmesartan Medoxomil-Hctz]     Painful and itchy knots on palms of hands  . Bystolic [Nebivolol Hcl]     headache  . Lovaza [Omega-3-Acid Ethyl Esters] Other (See Comments)    Unsure of reaction type  . Niaspan [Niacin Er] Other (See Comments)    flushing  . Septra [Bactrim] Hives   Current Outpatient Medications on File Prior to Visit  Medication Sig Dispense Refill  . aspirin EC 81 MG tablet Take 81 mg by  mouth daily.    Marland Kitchen atorvastatin (LIPITOR) 40 MG tablet Take 1 tablet (40 mg total) by mouth daily. 90 tablet 3  . Calcium Carb-Cholecalciferol 500-600 MG-UNIT TABS Take 1 tablet by mouth daily.    . carvedilol (COREG) 6.25 MG tablet Take 1 tablet (6.25 mg total) by mouth 2 (two) times daily. 180 tablet 3  . cetirizine (ZYRTEC) 10 MG tablet Take 20 mg by mouth daily.    . clobetasol cream (TEMOVATE) 2.95 % Apply 1 application topically  2 (two) times daily. To affected areas 30 g 1  . clopidogrel (PLAVIX) 75 MG tablet Take 1 tablet (75 mg total) by mouth daily. 30 tablet 0  . glimepiride (AMARYL) 4 MG tablet TAKE TWO TABLETS BY MOUTH ONCE DAILY BEFORE BREAKFAST (Patient taking differently: TAKE ONE TABLETS BY MOUTH ONCE DAILY BEFORE BREAKFAST) 180 tablet 2  . glucose blood (ONE TOUCH ULTRA TEST) test strip Check blood sugar once daily and as directed Dx E11.319 100 each 1  . HYDROcodone-acetaminophen (NORCO/VICODIN) 5-325 MG tablet Take 1 tablet by mouth every 6 (six) hours as needed for moderate pain.    Marland Kitchen ibuprofen (ADVIL,MOTRIN) 200 MG tablet Take 200-800 mg by mouth every 8 (eight) hours as needed (for pain.).    Marland Kitchen LANCETS ULTRA THIN 30G MISC by Does not apply route. One Touch    . metFORMIN (GLUCOPHAGE) 1000 MG tablet TAKE ONE TABLET BY MOUTH TWICE DAILY 180 tablet 1  . Multiple Vitamin (MULTIVITAMIN WITH MINERALS) TABS tablet Take 1 tablet by mouth daily.    . Multiple Vitamins-Minerals (AIRBORNE) TBEF Take 1 tablet by mouth daily as needed (for immune system support). Immune Health/Support    . nystatin cream (MYCOSTATIN) Apply 1 application topically 2 (two) times daily as needed for dry skin.    . predniSONE (DELTASONE) 10 MG tablet Take 1-2 tablets (10-20 mg total) by mouth daily as needed (for wegener's granulomatosis). 30 tablet 0  . spironolactone (ALDACTONE) 25 MG tablet TAKE ONE TABLET BY MOUTH ONCE DAILY 90 tablet 1  . tetrahydrozoline 0.05 % ophthalmic solution Place 1-2 drops into  both eyes 3 (three) times daily as needed (for dry/irritated eyes).    . Triamcinolone Acetonide (NASACORT ALLERGY 24HR NA) Place 2 sprays into the nose at bedtime.     . [DISCONTINUED] nebivolol (BYSTOLIC) 5 MG tablet Take 1 tablet (5 mg total) by mouth 2 (two) times daily. 180 tablet 3   No current facility-administered medications on file prior to visit.     Review of Systems  Constitutional: Negative for activity change, appetite change, fatigue, fever and unexpected weight change.  HENT: Negative for congestion, rhinorrhea, sore throat and trouble swallowing.   Eyes: Negative for pain, redness, itching and visual disturbance.  Respiratory: Negative for cough, chest tightness, shortness of breath and wheezing.   Cardiovascular: Negative for chest pain and palpitations.  Gastrointestinal: Negative for abdominal pain, blood in stool, constipation, diarrhea and nausea.  Endocrine: Negative for cold intolerance, heat intolerance, polydipsia and polyuria.  Genitourinary: Negative for difficulty urinating, dysuria, frequency and urgency.  Musculoskeletal: Negative for arthralgias, joint swelling and myalgias.  Skin: Positive for rash. Negative for pallor.  Neurological: Negative for dizziness, tremors, weakness, numbness and headaches.  Hematological: Negative for adenopathy. Does not bruise/bleed easily.  Psychiatric/Behavioral: Negative for decreased concentration and dysphoric mood. The patient is not nervous/anxious.        Objective:   Physical Exam  Constitutional: He appears well-developed and well-nourished. No distress.  obese and well appearing   HENT:  Head: Normocephalic and atraumatic.  Mouth/Throat: Oropharynx is clear and moist.  Eyes: Conjunctivae and EOM are normal. Pupils are equal, round, and reactive to light.  Neck: Normal range of motion. Neck supple. No JVD present. Carotid bruit is not present. No thyromegaly present.  Cardiovascular: Normal rate, regular rhythm,  normal heart sounds and intact distal pulses. Exam reveals no gallop.  Pulmonary/Chest: Effort normal and breath sounds normal. No respiratory distress. He has no wheezes. He has no rales.  No crackles  Abdominal: Soft. Bowel sounds are normal. He exhibits no distension, no abdominal bruit and no mass. There is no tenderness.  Musculoskeletal: He exhibits no edema.  Lymphadenopathy:    He has no cervical adenopathy.  Neurological: He is alert. He has normal reflexes.  Skin: Skin is warm and dry. Rash noted. No pallor.  One whelp on palm of L hand resembling whelp  Psychiatric: He has a normal mood and affect.          Assessment & Plan:   Problem List Items Addressed This Visit      Cardiovascular and Mediastinum   Hypertension    bp in fair control at this time  BP Readings from Last 1 Encounters:  05/19/17 132/76   No changes needed Disc lifstyle change with low sodium diet and exercise  Labs reviewed  Enc wt loss         Endocrine   Diabetes mellitus type 2 with retinopathy ([place]) - Primary    Lab Results  Component Value Date   HGBA1C 10.4 Repeated and verified X2. (H) 05/17/2017   Pt stopped metformin-thinks this may be causing his rash  Enc to work on wt loss /diet/exercise  Continues amaryl  Could not afford the other medications that were proposed by Dr Darnell Level Will refer to endocrinology for further eval        Relevant Orders   Ambulatory referral to Endocrinology     Musculoskeletal and Integument   Rash of hands    Pt suspects this may be allergic rxn to metformin  However he has a spot on his hand today despite being off of it Continue derm f/u  ? Consider allergy referral in the future         Other   Hyperlipemia    Disc goals for lipids and reasons to control them Rev labs with pt Rev low sat fat diet in detail  Continue lipitor LDL controlled Continue statin

## 2017-05-19 NOTE — Patient Instructions (Signed)
Keep working on diet and exercise  Stay off of metformin if it causes rash   Gradually add back other medicines  We will refer you to endocrinology

## 2017-05-21 NOTE — Assessment & Plan Note (Signed)
bp in fair control at this time  BP Readings from Last 1 Encounters:  05/19/17 132/76   No changes needed Disc lifstyle change with low sodium diet and exercise  Labs reviewed  Enc wt loss

## 2017-05-21 NOTE — Assessment & Plan Note (Signed)
Lab Results  Component Value Date   HGBA1C 10.4 Repeated and verified X2. (H) 05/17/2017   Pt stopped metformin-thinks this may be causing his rash  Enc to work on wt loss /diet/exercise  Continues amaryl  Could not afford the other medications that were proposed by Dr Darnell Level Will refer to endocrinology for further eval

## 2017-05-21 NOTE — Assessment & Plan Note (Signed)
Disc goals for lipids and reasons to control them Rev labs with pt Rev low sat fat diet in detail  Continue lipitor LDL controlled Continue statin

## 2017-05-21 NOTE — Assessment & Plan Note (Signed)
Pt suspects this may be allergic rxn to metformin  However he has a spot on his hand today despite being off of it Continue derm f/u  ? Consider allergy referral in the future

## 2017-05-29 ENCOUNTER — Telehealth: Payer: Self-pay | Admitting: Cardiovascular Disease

## 2017-05-29 MED ORDER — CLOPIDOGREL BISULFATE 75 MG PO TABS: 75.0000 mg | ORAL_TABLET | Freq: Every day | ORAL | 3 refills | Status: DC

## 2017-05-29 NOTE — Telephone Encounter (Signed)
Pt wife would like to request a medication change, states she needs 90 days for Clopidogrel.  [place]

## 2017-06-01 ENCOUNTER — Encounter: Payer: Self-pay | Admitting: Family Medicine

## 2017-06-01 ENCOUNTER — Ambulatory Visit (INDEPENDENT_AMBULATORY_CARE_PROVIDER_SITE_OTHER): Payer: Medicare HMO | Admitting: Family Medicine

## 2017-06-01 VITALS — BP 136/80 | HR 68 | Temp 97.9°F | Ht 70.5 in | Wt 244.4 lb

## 2017-06-01 DIAGNOSIS — M62838 Other muscle spasm: Secondary | ICD-10-CM

## 2017-06-01 DIAGNOSIS — M546 Pain in thoracic spine: Secondary | ICD-10-CM

## 2017-06-01 MED ORDER — CYCLOBENZAPRINE HCL 10 MG PO TABS: 10.0000 mg | ORAL_TABLET | Freq: Three times a day (TID) | ORAL | 0 refills | Status: DC | PRN

## 2017-06-01 MED ORDER — TRAMADOL HCL 50 MG PO TABS: 50.0000 mg | ORAL_TABLET | Freq: Four times a day (QID) | ORAL | 0 refills | Status: DC | PRN

## 2017-06-01 NOTE — Progress Notes (Signed)
Subjective  CC:  Chief Complaint  Patient presents with  . RIght sided mid back pain    x 4 days    HPI: Ryan Morgan is a 77 y.o. male who presents to the office today to address the problems listed above in the chief complaint.  77 yo new pt to me presents with 4 day h/o left sided mid-upper back pain with mvt. Has "spasms" or catches that have worsened over the last 3-4 days. sxs came on gradually; now are very painful with certain mvts and interfered with sleep. Feels fine if sitting or standing still. Used percocet yesterday so he could go out for lunch. It resolved the pain. He has been in hot tub to try to alleviate sxs as well. No low back pain or b/b dysfunction, pleuritic cp although at times the catching pain will grab him with deep breaths. No trauma. Traveled to beach last weekend, lifted bags but denies injury or strain.   Has type 2 diabetes now uncontrolled due to stopping metformin due to allergy. He is monitoring his sugars and has f/u with endocrinology.   I reviewed the patients updated PMH, FH, and SocHx.    Patient Active Problem List   Diagnosis Date Noted  . Current chronic use of systemic steroids 11/15/2016  . Routine general medical examination at a health care facility 11/09/2015  . Hearing loss 11/09/2015  . History of colonic polyps 11/09/2015  . Right leg pain 10/30/2015  . Claudication of calf muscles ([place]) 10/13/2015  . Rash of hands 03/10/2015  . Colon cancer screening 06/09/2014  . Blepharitis, bilateral 06/02/2014  . Shortness of breath 03/10/2014  . Atypical chest pain 03/10/2014  . Benign paroxysmal positional vertigo 12/14/2013  . Bursitis of right hip 10/17/2013  . Coronary artery disease of native artery of native heart with stable angina pectoris ([place]) 03/19/2013  . Encounter for Medicare annual wellness exam 03/05/2013  . 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 Osteopenia 03/05/2013  . Obesity 07/15/2011    . Prostate cancer screening 07/10/2011  . Cough 12/23/2010  . Hypertension 08/30/2010  . Hyperlipemia 08/30/2010  . Diabetes mellitus type 2 with retinopathy ([place]) 08/30/2010  . Wegener's granulomatosis ([place]) 08/30/2010   Current Meds  Medication Sig  . aspirin EC 81 MG tablet Take 81 mg by mouth daily.  Marland Kitchen atorvastatin (LIPITOR) 40 MG tablet Take 1 tablet (40 mg total) by mouth daily.  . Calcium Carb-Cholecalciferol 500-600 MG-UNIT TABS Take 1 tablet by mouth daily.  . carvedilol (COREG) 6.25 MG tablet Take 1 tablet (6.25 mg total) by mouth 2 (two) times daily.  . cetirizine (ZYRTEC) 10 MG tablet Take 20 mg by mouth daily.  . clobetasol cream (TEMOVATE) 4.62 % Apply 1 application topically 2 (two) times daily. To affected areas  . clopidogrel (PLAVIX) 75 MG tablet Take 1 tablet (75 mg total) by mouth daily.  Marland Kitchen glimepiride (AMARYL) 4 MG tablet TAKE TWO TABLETS BY MOUTH ONCE DAILY BEFORE BREAKFAST (Patient taking differently: TAKE ONE TABLETS BY MOUTH ONCE DAILY BEFORE BREAKFAST)  . glucose blood (ONE TOUCH ULTRA TEST) test strip Check blood sugar once daily and as directed Dx E11.319  . ibuprofen (ADVIL,MOTRIN) 200 MG tablet Take 200-800 mg by mouth every 8 (eight) hours as needed (for pain.).  Marland Kitchen LANCETS ULTRA THIN 30G MISC by Does not apply route. One Touch  . Multiple Vitamin (MULTIVITAMIN WITH MINERALS) TABS tablet Take 1 tablet by mouth daily.  . Multiple  Vitamins-Minerals (AIRBORNE) TBEF Take 1 tablet by mouth daily as needed (for immune system support). Immune Health/Support  . nystatin cream (MYCOSTATIN) Apply 1 application topically 2 (two) times daily as needed for dry skin.  . predniSONE (DELTASONE) 10 MG tablet Take 1-2 tablets (10-20 mg total) by mouth daily as needed (for wegener's granulomatosis).  Marland Kitchen tetrahydrozoline 0.05 % ophthalmic solution Place 1-2 drops into both eyes 3 (three) times daily as needed (for dry/irritated eyes).  . Triamcinolone Acetonide (NASACORT ALLERGY  24HR NA) Place 2 sprays into the nose at bedtime.     Allergies: Patient is allergic to metformin and related; benicar hct [olmesartan medoxomil-hctz]; bystolic [nebivolol hcl]; lovaza [omega-3-acid ethyl esters]; niaspan [niacin er]; and septra [bactrim]. Family History: Patient family history includes Cancer in his mother; Stroke in his maternal grandfather. Social History:  Patient  reports that he quit smoking about 39 years ago. His smoking use included cigarettes. He has a 10.00 pack-year smoking history. he has never used smokeless tobacco. He reports that he does not drink alcohol or use drugs.  Review of Systems: Constitutional: Negative for fever malaise or anorexia Cardiovascular: negative for chest pain Respiratory: negative for SOB or persistent cough Gastrointestinal: negative for abdominal pain  Objective  Vitals: BP 136/80 (BP Location: Left Arm, Patient Position: Sitting, Cuff Size: Large)   Pulse 68   Temp 97.9 F (36.6 C) (Oral)   Ht 5' 10.5" (1.791 m)   Wt 244 lb 6.1 oz (110.9 kg)   SpO2 96%   BMI 34.57 kg/m  General: no acute distress if sitting still but moves cautiously and gets sharp pain with mvt , A&Ox3 HEENT: PEERL, conjunctiva normal, Oropharynx moist,neck is supple Cardiovascular:  RRR without murmur or gallop.  Respiratory:  Good breath sounds bilaterally, CTAB with normal respiratory effort Skin:  Warm, no vesicular rash on back MSK: right thoracic paravertebral area is nontender to palpation, but gets pain here with lateral flexion or twisting. Can forward flex well. No anterior chest wall ttp. No ecchymosis or crepitus.   Assessment  1. Muscle spasm   2. Acute right-sided thoracic back pain      Plan   MSK back pain:   Start pain meds, mm relaxer, and moist heat. Check xrays to ensure no bony abnormalities as cause of pain. Rest. Recheck in 1-2 weeks if not resolved.   Follow up: 1-2 weeks with PCP    Commons side effects, risks, benefits,  and alternatives for medications and treatment plan prescribed today were discussed, and the patient expressed understanding of the given instructions. Patient is instructed to call or message via MyChart if he/she has any questions or concerns regarding our treatment plan. No barriers to understanding were identified. We discussed Red Flag symptoms and signs in detail. Patient expressed understanding regarding what to do in case of urgent or emergency type symptoms.   Medication list was reconciled, printed and provided to the patient in AVS. Patient instructions and summary information was reviewed with the patient as documented in the AVS. This note was prepared with assistance of Dragon voice recognition software. Occasional wrong-word or sound-a-like substitutions may have occurred due to the inherent limitations of voice recognition software  Orders Placed This Encounter  Procedures  . DG Ribs Unilateral Right  . DG Thoracic Spine 4V   Meds ordered this encounter  Medications  . cyclobenzaprine (FLEXERIL) 10 MG tablet    Sig: Take 1 tablet (10 mg total) by mouth 3 (three) times daily as needed for  muscle spasms.    Dispense:  30 tablet    Refill:  0  . traMADol (ULTRAM) 50 MG tablet    Sig: Take 1 tablet (50 mg total) by mouth every 6 (six) hours as needed for moderate pain.    Dispense:  30 tablet    Refill:  0

## 2017-06-01 NOTE — Patient Instructions (Signed)
Please return in 1 - 2 weeks to see Dr. Glori Bickers for a recheck. Please go get your xrays at [facility] office.   If you have any questions or concerns, please don't hesitate to send me a message via MyChart or call the office at [phone]. Thank you for visiting with Ryan Morgan today! It's our pleasure caring for you.

## 2017-06-02 ENCOUNTER — Ambulatory Visit (INDEPENDENT_AMBULATORY_CARE_PROVIDER_SITE_OTHER)
Admission: RE | Admit: 2017-06-02 | Discharge: 2017-06-02 | Disposition: A | Discharge status: Home / Self Care | Payer: Medicare HMO | Source: Ambulatory Visit | Attending: Family Medicine | Admitting: Family Medicine

## 2017-06-02 ENCOUNTER — Other Ambulatory Visit: Payer: Self-pay | Admitting: Family Medicine

## 2017-06-02 DIAGNOSIS — M546 Pain in thoracic spine: Secondary | ICD-10-CM

## 2017-06-02 DIAGNOSIS — R0789 Other chest pain: Secondary | ICD-10-CM | POA: Diagnosis not present

## 2017-06-15 ENCOUNTER — Ambulatory Visit: Payer: Medicare HMO | Admitting: Internal Medicine

## 2017-06-15 ENCOUNTER — Ambulatory Visit (INDEPENDENT_AMBULATORY_CARE_PROVIDER_SITE_OTHER): Payer: Medicare HMO | Admitting: Endocrinology

## 2017-06-15 ENCOUNTER — Encounter: Payer: Self-pay | Admitting: Endocrinology

## 2017-06-15 VITALS — BP 148/82 | HR 67 | Wt 240.4 lb

## 2017-06-15 DIAGNOSIS — E11319 Type 2 diabetes mellitus with unspecified diabetic retinopathy without macular edema: Secondary | ICD-10-CM

## 2017-06-15 NOTE — Patient Instructions (Addendum)
good diet and exercise significantly improve the control of your diabetes.  please let me know if you wish to be referred to a dietician.  high blood sugar is very risky to your health.  you should see an eye doctor and dentist every year.  It is very important to get all recommended vaccinations.  Controlling your blood pressure and cholesterol drastically reduces the damage diabetes does to your body.  Those who smoke should quit.  Please discuss these with your doctor.  check your blood sugar once a day.  vary the time of day when you check, between before the 3 meals, and at bedtime.  also check if you have symptoms of your blood sugar being too high or too low.  please keep a record of the readings and bring it to your next appointment here (or you can bring the meter itself).  You can write it on any piece of paper.  please call us sooner if your blood sugar goes below 70, or if you have a lot of readings over 200. blood tests are requested for you today.  We'll let you know about the results. Based on the results, we'll try to transition to other meds such as [place].   Out goal is for you to need the glimeperide only after taking the prednisone. Please come back for a follow-up appointment in 2 weeks.  please see Vaughan Basta the same day, so you can learn how to take insulin, I case you need to.

## 2017-06-15 NOTE — Progress Notes (Signed)
Subjective:    Patient ID: Ryan Morgan, male    DOB: 31-Mar-1941, 77 y.o.   MRN: [id number]  HPI pt is referred by Dr Glori Bickers, for diabetes.  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  He did not tolerate metformin (rash).  He takes glimepiride only.  Meter is downloaded today, and the printout is scanned into the record.  It varies from 110-500.  He check fasting only.  He says the times on his meter are not correct.   Past Medical History:  Diagnosis Date  . Coronary artery disease    a. prior LAD stenting 2000; b. [place] 2003 LAD 40, D1 50; c. Lexiscan 02/2017: large defect of severe severity in the basal inferoseptal, basal inferior, mid inferoseptal, mid inferior and apical inferior location, high risk; d. LHC 03/29/17: LM nl, patent LAD stent w/ 40% ISR, D1 100,  OM1 90, RPDA 100 w/ L-R collats, post atrio 80% s/p PCI/DES    . Diabetes mellitus   . Hyperlipidemia   . Hypertension   . Ischemic cardiomyopathy    a. TTE 02/2017: EF 30-35%, diffuse HK, normal LV diastolic function parameters, mild AI/MR, RV systolic function normal, PASP normal  . Lung nodule    a. s/p prior thoracotomy  . Morbid obesity ([place])   . Wegener's granulomatosis ([place]) 2007    Past Surgical History:  Procedure Laterality Date  . BASAL CELL CARCINOMA EXCISION     removed from face x 3  . CARDIAC CATHETERIZATION    . CARDIAC SURGERY  2000   stent in heart  . CORONARY ANGIOPLASTY  09/22/1998   s/p stent placement; Tristar 3.0 x 18 mm Ref [id number]  . CORONARY STENT INTERVENTION N/A 03/29/2017   Procedure: CORONARY STENT INTERVENTION;  Surgeon: Wellington Hampshire, MD;   Location: [facility];  Service: Cardiovascular;  Laterality: N/A;  . LEFT HEART CATH AND CORONARY ANGIOGRAPHY N/A 03/29/2017   Procedure: LEFT HEART CATH AND CORONARY ANGIOGRAPHY;  Surgeon: Wellington Hampshire, MD;  Location: [facility];  Service: Cardiovascular;  Laterality: N/A;  . 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 ULTRASOUND GUIDANCE FOR VASCULAR ACCESS  03/29/2017   Procedure: Ultrasound Guidance For Vascular Access;  Surgeon: Wellington Hampshire, MD;  Location: [facility];  Service: Cardiovascular;;    Social History   Socioeconomic History  . Marital status: Married    Spouse name: Not on file  . Number of children: Not on file  . Years of education: Not on file  . Highest education level: Not on file  Social Needs  . Financial resource strain: Not on file  . Food insecurity - worry: Not on file  . Food insecurity - inability: Not on file  . Transportation needs - medical: Not on file  . Transportation needs - non-medical: Not on file  Occupational History  . Not on file  Tobacco Use  . Smoking status: Former Smoker    Packs/day: 0.50    Years: 20.00    Pack years: 10.00    Types: Cigarettes    Last attempt to  quit: 05/30/1978    Years since quitting: 39.0  . Smokeless tobacco: Never Used  Substance and Sexual Activity  . Alcohol use: No    Alcohol/week: 0.0 oz  . Drug use: No  . Sexual activity: No  Other Topics Concern  . Not on file  Social History Narrative   Regular exercise: yes   Caffeine use: very little    Current Outpatient Medications on File Prior to Visit  Medication Sig Dispense Refill  . aspirin EC 81 MG tablet Take 81 mg by mouth daily.    Marland Kitchen atorvastatin (LIPITOR) 40 MG tablet Take 1 tablet (40 mg total) by mouth daily. 90 tablet 3  . Calcium Carb-Cholecalciferol 500-600 MG-UNIT TABS Take 1 tablet by mouth daily.    . carvedilol (COREG) 6.25 MG tablet Take 1 tablet (6.25 mg total) by mouth 2 (two)  times daily. 180 tablet 3  . cetirizine (ZYRTEC) 10 MG tablet Take 20 mg by mouth daily.    . clobetasol cream (TEMOVATE) 7.06 % Apply 1 application topically 2 (two) times daily. To affected areas 30 g 1  . clopidogrel (PLAVIX) 75 MG tablet Take 1 tablet (75 mg total) by mouth daily. 90 tablet 3  . cyclobenzaprine (FLEXERIL) 10 MG tablet Take 1 tablet (10 mg total) by mouth 3 (three) times daily as needed for muscle spasms. 30 tablet 0  . glimepiride (AMARYL) 4 MG tablet TAKE TWO TABLETS BY MOUTH ONCE DAILY BEFORE BREAKFAST (Patient taking differently: TAKE ONE TABLETS BY MOUTH ONCE DAILY BEFORE BREAKFAST) 180 tablet 2  . glucose blood (ONE TOUCH ULTRA TEST) test strip Check blood sugar once daily and as directed Dx E11.319 100 each 1  . ibuprofen (ADVIL,MOTRIN) 200 MG tablet Take 200-800 mg by mouth every 8 (eight) hours as needed (for pain.).    Marland Kitchen LANCETS ULTRA THIN 30G MISC by Does not apply route. One Touch    . Multiple Vitamin (MULTIVITAMIN WITH MINERALS) TABS tablet Take 1 tablet by mouth daily.    . Multiple Vitamins-Minerals (AIRBORNE) TBEF Take 1 tablet by mouth daily as needed (for immune system support). Immune Health/Support    . nystatin cream (MYCOSTATIN) Apply 1 application topically 2 (two) times daily as needed for dry skin.    . predniSONE (DELTASONE) 10 MG tablet Take 1-2 tablets (10-20 mg total) by mouth daily as needed (for wegener's granulomatosis). 30 tablet 0  . tetrahydrozoline 0.05 % ophthalmic solution Place 1-2 drops into both eyes 3 (three) times daily as needed (for dry/irritated eyes).    . traMADol (ULTRAM) 50 MG tablet Take 1 tablet (50 mg total) by mouth every 6 (six) hours as needed for moderate pain. 30 tablet 0  . Triamcinolone Acetonide (NASACORT ALLERGY 24HR NA) Place 2 sprays into the nose at bedtime.     . [DISCONTINUED] nebivolol (BYSTOLIC) 5 MG tablet Take 1 tablet (5 mg total) by mouth 2 (two) times daily. 180 tablet 3   No current facility-administered  medications on file prior to visit.     Allergies  Allergen Reactions  . Metformin And Related Itching  . Benicar Hct [Olmesartan Medoxomil-Hctz]     Painful and itchy knots on palms of hands  . Bystolic [Nebivolol Hcl]     headache  . Lovaza [Omega-3-Acid Ethyl Esters] Other (See Comments)    Unsure of reaction type  . Niaspan [Niacin Er] Other (See Comments)    flushing  . Septra [Bactrim] Hives    Family History  Problem Relation Age of  Onset  . Cancer Mother        tumor on tonsil  . Stroke Maternal Grandfather   . Diabetes Neg Hx     BP (!) 148/82 (BP Location: Left Arm, Patient Position: Sitting, Cuff Size: Normal)   Pulse 67   Wt 240 lb 6.4 oz (109 kg)   SpO2 97%   BMI 34.01 kg/m    Review of Systems denies blurry vision, headache, chest pain, sob, n/v, urinary frequency, excessive diaphoresis, memory loss, depression, cold intolerance, rhinorrhea, and easy bruising.  He has lost a few lbs, due to his efforts.  He has intermitt leg cramps and easy bruising.      Objective:   Physical Exam VS: see vs page GEN: no distress HEAD: head: no deformity eyes: no periorbital swelling, no proptosis external nose and ears are normal mouth: no lesion seen NECK: supple, thyroid is not enlarged CHEST WALL: no deformity LUNGS: clear to auscultation CV: reg rate and rhythm, no murmur.  ABD: abdomen is soft, nontender.  no hepatosplenomegaly.  not distended.  no hernia MUSCULOSKELETAL: muscle bulk and strength are grossly normal.  no obvious joint swelling.  gait is normal and steady EXTEMITIES: no deformity.  no ulcer on the feet, but the skin is scaly.  feet are of normal color and temp.  Trace bilat leg edema.  There is bilateral onychomycosis of the toenails.  PULSES: dorsalis pedis intact bilat.  no carotid bruit NEURO:  cn 2-12 grossly intact.   readily moves all 4's.  sensation is intact to touch on the feet, but decreased from normal SKIN:  Normal texture and  temperature.  No rash or suspicious lesion is visible.   NODES:  None palpable at the neck PSYCH: alert, well-oriented.  Does not appear anxious nor depressed.  Lab Results  Component Value Date   CREATININE 1.01 05/17/2017   BUN 14 05/17/2017   NA 134 (L) 05/17/2017   K 4.2 05/17/2017   CL 98 05/17/2017   CO2 31 05/17/2017   Lab Results  Component Value Date   MICROALBUR 2.2 (H) 09/16/2014    Lab Results  Component Value Date   HGBA1C 10.4 Repeated and verified X2. (H) 05/17/2017   I personally reviewed electrocardiogram tracing (04/19/17): Indication: CAD Impression: NSR.  Poss old MI.  No hypertrophy.   Compared to 03/29/17: no significant change.    I have reviewed outside records, and summarized: Pt was noted to have elevated a1c, and referred here.      Assessment & Plan:  type 2 DM: severe exacerbation.  We discussed the fact that he prob needs insulin.  Wegener's Granulomatosis.  This is intermittently affecting glycemic control, so med regimen will need to consider this.   Patient Instructions  good diet and exercise significantly improve the control of your diabetes.  please let me know if you wish to be referred to a dietician.  high blood sugar is very risky to your health.  you should see an eye doctor and dentist every year.  It is very important to get all recommended vaccinations.  Controlling your blood pressure and cholesterol drastically reduces the damage diabetes does to your body.  Those who smoke should quit.  Please discuss these with your doctor.  check your blood sugar once a day.  vary the time of day when you check, between before the 3 meals, and at bedtime.  also check if you have symptoms of your blood sugar being too high or too low.  please keep a record of the readings and bring it to your next appointment here (or you can bring the meter itself).  You can write it on any piece of paper.  please call us sooner if your blood sugar goes below 70,  or if you have a lot of readings over 200. blood tests are requested for you today.  We'll let you know about the results. Based on the results, we'll try to transition to other meds such as [place].   Out goal is for you to need the glimeperide only after taking the prednisone. Please come back for a follow-up appointment in 2 weeks.  please see Vaughan Basta the same day, so you can learn how to take insulin, I case you need to.

## 2017-06-19 ENCOUNTER — Other Ambulatory Visit: Payer: Self-pay | Admitting: Endocrinology

## 2017-06-19 LAB — FRUCTOSAMINE: FRUCTOSAMINE: 333 umol/L — AB (ref 190–270)

## 2017-06-19 MED ORDER — GLIMEPIRIDE 1 MG PO TABS: 1.0000 mg | ORAL_TABLET | Freq: Every day | ORAL | 1 refills | Status: DC

## 2017-06-19 MED ORDER — SITAGLIPTIN PHOSPHATE 100 MG PO TABS: 100.0000 mg | ORAL_TABLET | Freq: Every day | ORAL | 11 refills | Status: DC

## 2017-06-27 ENCOUNTER — Ambulatory Visit: Payer: Medicare HMO | Admitting: Cardiovascular Disease

## 2017-06-29 DIAGNOSIS — L508 Other urticaria: Secondary | ICD-10-CM | POA: Diagnosis not present

## 2017-06-29 DIAGNOSIS — L570 Actinic keratosis: Secondary | ICD-10-CM | POA: Diagnosis not present

## 2017-06-29 DIAGNOSIS — Z79899 Other long term (current) drug therapy: Secondary | ICD-10-CM | POA: Diagnosis not present

## 2017-06-29 DIAGNOSIS — L821 Other seborrheic keratosis: Secondary | ICD-10-CM | POA: Diagnosis not present

## 2017-06-29 DIAGNOSIS — X32XXXA Exposure to sunlight, initial encounter: Secondary | ICD-10-CM | POA: Diagnosis not present

## 2017-06-29 DIAGNOSIS — Z85828 Personal history of other malignant neoplasm of skin: Secondary | ICD-10-CM | POA: Diagnosis not present

## 2017-07-01 DIAGNOSIS — L508 Other urticaria: Secondary | ICD-10-CM | POA: Diagnosis not present

## 2017-07-01 DIAGNOSIS — Z5181 Encounter for therapeutic drug level monitoring: Secondary | ICD-10-CM | POA: Diagnosis not present

## 2017-07-04 ENCOUNTER — Ambulatory Visit (INDEPENDENT_AMBULATORY_CARE_PROVIDER_SITE_OTHER): Payer: Medicare HMO | Admitting: Endocrinology

## 2017-07-04 ENCOUNTER — Encounter: Payer: Self-pay | Admitting: Endocrinology

## 2017-07-04 ENCOUNTER — Encounter: Payer: Medicare HMO | Attending: Endocrinology | Admitting: Nutrition

## 2017-07-04 VITALS — BP 132/82 | HR 67 | Wt 241.8 lb

## 2017-07-04 DIAGNOSIS — Z713 Dietary counseling and surveillance: Secondary | ICD-10-CM | POA: Insufficient documentation

## 2017-07-04 DIAGNOSIS — Z794 Long term (current) use of insulin: Secondary | ICD-10-CM | POA: Insufficient documentation

## 2017-07-04 DIAGNOSIS — E11319 Type 2 diabetes mellitus with unspecified diabetic retinopathy without macular edema: Secondary | ICD-10-CM | POA: Diagnosis not present

## 2017-07-04 DIAGNOSIS — E119 Type 2 diabetes mellitus without complications: Secondary | ICD-10-CM

## 2017-07-04 MED ORDER — EMPAGLIFLOZIN 10 MG PO TABS: 10.0000 mg | ORAL_TABLET | Freq: Every day | ORAL | 11 refills | Status: DC

## 2017-07-04 NOTE — Progress Notes (Signed)
Pt. And his wife are here to learn about insulin injections.  Wife already takes insulin via a pen.  He redemonstrated how to attach the needle and reported good understanding of how/where to inject.   We also reviewed low blood sugars--symptoms and treatments.  He had no final questions.   Also discussed some necessary dietary changes needed to better control his blood sugar.  He is eating cold cereal and milk for breakfast and sometimes at night.  He was given other suggestions for breakfast meals, and night time snack options that will not raise blood sugar as quickly They had no final questions.

## 2017-07-04 NOTE — Patient Instructions (Addendum)
check your blood sugar once a day.  vary the time of day when you check, between before the 3 meals, and at bedtime.  also check if you have symptoms of your blood sugar being too high or too low.  please keep a record of the readings and bring it to your next appointment here (or you can bring the meter itself).  You can write it on any piece of paper.  please call us sooner if your blood sugar goes below 70, or if you have a lot of readings over 200.  I have sent a prescription to your pharmacy, to add "jardiance."  Out goal is for you to need the glimeperide only after taking the prednisone, and the insulin only for emergencies Please come back for a follow-up appointment in 1 month.   please see Vaughan Basta today, so you can learn how to take insulin, I case you need to.

## 2017-07-04 NOTE — Progress Notes (Signed)
Subjective:    Patient ID: Ryan Morgan, male    DOB: 07-06-1940, 77 y.o.   MRN: [id number]  HPI Pt returns for f/u of diabetes mellitus: DM type: 2 Dx'ed: 3825 Complications: polyneuropathy, DR, and CAD Therapy: 2 oral meds DKA: never Severe hypoglycemia: never Pancreatitis: never Pancreatic imaging: never Other: he takes intermittent prednisone for Wegener's granulomatosis; he did not tolerate metformin (rash) Interval history: Most recent steroids were 1 week ago.  At that time, cbg's were 170-220.  Since then, cbg's have improved to the mid-100's.  pt states he feels well in general.   Past Medical History:  Diagnosis Date  . Coronary artery disease    a. prior LAD stenting 2000; b. [place] 2003 LAD 40, D1 50; c. Lexiscan 02/2017: large defect of severe severity in the basal inferoseptal, basal inferior, mid inferoseptal, mid inferior and apical inferior location, high risk; d. LHC 03/29/17: LM nl, patent LAD stent w/ 40% ISR, D1 100,  OM1 90, RPDA 100 w/ L-R collats, post atrio 80% s/p PCI/DES    . Diabetes mellitus   . Hyperlipidemia   . Hypertension   . Ischemic cardiomyopathy    a. TTE 02/2017: EF 30-35%, diffuse HK, normal LV diastolic function parameters, mild AI/MR, RV systolic function normal, PASP normal  . Lung nodule    a. s/p prior thoracotomy  . Morbid obesity ([place])   . Wegener's granulomatosis ([place]) 2007    Past Surgical History:  Procedure Laterality Date  . BASAL CELL CARCINOMA EXCISION     removed from face x 3  . CARDIAC CATHETERIZATION    . CARDIAC SURGERY  2000   stent in heart  . CORONARY ANGIOPLASTY  09/22/1998   s/p stent placement; Tristar 3.0 x 18 mm Ref [id number]  . CORONARY STENT INTERVENTION N/A 03/29/2017   Procedure: CORONARY STENT INTERVENTION;  Surgeon: Wellington Hampshire, MD;  Location: [facility];  Service: Cardiovascular;  Laterality: N/A;  . LEFT HEART CATH AND CORONARY ANGIOGRAPHY N/A 03/29/2017   Procedure: LEFT HEART CATH AND  CORONARY ANGIOGRAPHY;  Surgeon: Wellington Hampshire, MD;  Location: [facility];  Service: Cardiovascular;  Laterality: N/A;  . 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 ULTRASOUND GUIDANCE FOR VASCULAR ACCESS  03/29/2017   Procedure: Ultrasound Guidance For Vascular Access;  Surgeon: Wellington Hampshire, MD;  Location: [facility];  Service: Cardiovascular;;    Social History   Socioeconomic History  . Marital status: Married    Spouse name: Not on file  . Number of children: Not on file  . Years of education: Not on file  . Highest education level: Not on file  Social Needs  . Financial resource strain: Not on file  . Food insecurity - worry: Not on file  . Food insecurity - inability: Not on file  . Transportation needs - medical: Not on file  . Transportation needs - non-medical: Not on file  Occupational History  . Not on file  Tobacco Use  . Smoking status: Former Smoker    Packs/day: 0.50    Years: 20.00    Pack years: 10.00    Types: Cigarettes    Last attempt to quit: 05/30/1978    Years since quitting: 39.1  . Smokeless tobacco: Never Used  Substance and Sexual Activity  . Alcohol use: No    Alcohol/week: 0.0 oz  . Drug use: No  . Sexual activity: No  Other Topics  Concern  . Not on file  Social History Narrative   Regular exercise: yes   Caffeine use: very little    Current Outpatient Medications on File Prior to Visit  Medication Sig Dispense Refill  . aspirin EC 81 MG tablet Take 81 mg by mouth daily.    Marland Kitchen atorvastatin (LIPITOR) 40 MG tablet Take 1 tablet (40 mg total) by mouth daily. 90 tablet 3  . Calcium Carb-Cholecalciferol 500-600 MG-UNIT TABS Take 1 tablet by mouth daily.    . carvedilol (COREG) 6.25 MG tablet Take 1 tablet (6.25 mg total) by mouth 2 (two) times daily. 180 tablet 3  . cetirizine (ZYRTEC) 10 MG tablet Take 20 mg by mouth daily.    . clobetasol cream (TEMOVATE) 3.82 % Apply 1 application topically 2  (two) times daily. To affected areas 30 g 1  . clopidogrel (PLAVIX) 75 MG tablet Take 1 tablet (75 mg total) by mouth daily. 90 tablet 3  . cyclobenzaprine (FLEXERIL) 10 MG tablet Take 1 tablet (10 mg total) by mouth 3 (three) times daily as needed for muscle spasms. 30 tablet 0  . glimepiride (AMARYL) 1 MG tablet Take 1 tablet (1 mg total) by mouth daily with breakfast. 30 tablet 1  . glucose blood (ONE TOUCH ULTRA TEST) test strip Check blood sugar once daily and as directed Dx E11.319 100 each 1  . ibuprofen (ADVIL,MOTRIN) 200 MG tablet Take 200-800 mg by mouth every 8 (eight) hours as needed (for pain.).    Marland Kitchen LANCETS ULTRA THIN 30G MISC by Does not apply route. One Touch    . Multiple Vitamin (MULTIVITAMIN WITH MINERALS) TABS tablet Take 1 tablet by mouth daily.    . Multiple Vitamins-Minerals (AIRBORNE) TBEF Take 1 tablet by mouth daily as needed (for immune system support). Immune Health/Support    . nystatin cream (MYCOSTATIN) Apply 1 application topically 2 (two) times daily as needed for dry skin.    . predniSONE (DELTASONE) 10 MG tablet Take 1-2 tablets (10-20 mg total) by mouth daily as needed (for wegener's granulomatosis). 30 tablet 0  . sitaGLIPtin (JANUVIA) 100 MG tablet Take 1 tablet (100 mg total) by mouth daily. 30 tablet 11  . tetrahydrozoline 0.05 % ophthalmic solution Place 1-2 drops into both eyes 3 (three) times daily as needed (for dry/irritated eyes).    . traMADol (ULTRAM) 50 MG tablet Take 1 tablet (50 mg total) by mouth every 6 (six) hours as needed for moderate pain. 30 tablet 0  . Triamcinolone Acetonide (NASACORT ALLERGY 24HR NA) Place 2 sprays into the nose at bedtime.     . [DISCONTINUED] nebivolol (BYSTOLIC) 5 MG tablet Take 1 tablet (5 mg total) by mouth 2 (two) times daily. 180 tablet 3   No current facility-administered medications on file prior to visit.     Allergies  Allergen Reactions  . Metformin And Related Itching  . Benicar Hct [Olmesartan  Medoxomil-Hctz]     Painful and itchy knots on palms of hands  . Bystolic [Nebivolol Hcl]     headache  . Lovaza [Omega-3-Acid Ethyl Esters] Other (See Comments)    Unsure of reaction type  . Niaspan [Niacin Er] Other (See Comments)    flushing  . Septra [Bactrim] Hives    Family History  Problem Relation Age of Onset  . Cancer Mother        tumor on tonsil  . Stroke Maternal Grandfather   . Diabetes Neg Hx     BP 132/82 (BP Location: Left  Arm, Patient Position: Sitting, Cuff Size: Normal)   Pulse 67   Wt 241 lb 12.8 oz (109.7 kg)   SpO2 96%   BMI 34.20 kg/m   Review of Systems He denies hypoglycemia    Objective:   Physical Exam VITAL SIGNS:  See vs page GENERAL: no distress Pulses: dorsalis pedis intact bilat.   MSK: no deformity of the feet CV: 1+ bilat leg edema Skin:  no ulcer on the feet.  normal color and temp on the feet. Neuro: sensation is intact to touch on the feet, but decreased from normal.  Ext: There is bilateral onychomycosis of the toenails  Lab Results  Component Value Date   CREATININE 1.01 05/17/2017   BUN 14 05/17/2017   NA 134 (L) 05/17/2017   K 4.2 05/17/2017   CL 98 05/17/2017   CO2 31 05/17/2017        Assessment & Plan:  Type 2 DM: he needs increased rx Edema: this continues to limit rx options Wegener's Granulomatosis.  We need a regimen that takes intermitt steroids into account.   Patient Instructions  check your blood sugar once a day.  vary the time of day when you check, between before the 3 meals, and at bedtime.  also check if you have symptoms of your blood sugar being too high or too low.  please keep a record of the readings and bring it to your next appointment here (or you can bring the meter itself).  You can write it on any piece of paper.  please call us sooner if your blood sugar goes below 70, or if you have a lot of readings over 200.  I have sent a prescription to your pharmacy, to add "jardiance."  Out goal is  for you to need the glimeperide only after taking the prednisone, and the insulin only for emergencies Please come back for a follow-up appointment in 1 month.   please see Vaughan Basta today, so you can learn how to take insulin, I case you need to.

## 2017-07-06 NOTE — Patient Instructions (Signed)
Stop eating cold cereal and milk with fruit for breakfast.   Call if questions

## 2017-07-30 NOTE — Progress Notes (Signed)
Patient ID: SANAV REMER, male   DOB: Jul 04, 1940, 77 y.o.   MRN: [id number] Cardiology Office Note  Date:  08/01/2017   ID:  LAVARIUS DOUGHTEN, DOB 03/03/1941, MRN [id number]  PCP:  Abner Greenspan, MD   Chief Complaint  Patient presents with  . Other    4 month follow up. Meds reviewed verbally with patient.     HPI:  Mr. Corti is a very pleasant 77 year-old gentleman with a history of  DM,  smoking hx,  coronary artery disease,  stent placed to his LAD in 2000,  (Lad stent 3.0 x 18 in 2000) repeat catheterization in January 2003 with 40% LAD, 50% diagonal disease, history of Wegener's granulomatosis, on chronic steroids diabetes. Prior thoracotomy surgery on the left for lung nodule, chronic pain on the left that is episodic, stable over several years Chronic shortness of breath, sedentary lifestyle, obesity/deconditioning Stent October 2018 for shortness of breath, chest tightness, fatigue who presents for follow up of his CAD and shortness of breath  In follow-up he reports that he feels well Has been doing some exercise, has changed his diet, reports his weight is down "Feels great"  Stopped lipitor on his own. Wife does not like statins Reports he thought the Lipitor was giving him some rash on his fingers Turns out he used a different lotion or cream and symptoms went away but did not restart the Lipitor Thinks that he can keep cholesterol low enough by changing his diet  Reports that symptoms of shortness of breath, fatigue, chest discomfort have resolved after stent placement October 2018  Previous records reviewed with him in detail Echocardiogram with ejection fraction 35% with large region of anterior wall hypokinesis Region of hypokinesis possibly secondary to prior anterior wall MI in 2000 at which time stent was placed   stress test He has fixed anterior , anteroseptal wall defect consistent with previous MI  Also with inferior wall decreased perfusion with component  of  Ischemia  raising the concern of RCA disease Effusion in the lateral wall appears to be preserved Ejection fraction is depressed  He had cardiac catheterization 03/29/2017 Stent placed to ostial right posterior AV groove artery   Ost 1st Mrg to 1st Mrg lesion, 90 %stenosed.  Prox LAD to Mid LAD lesion, 40 %stenosed.  1st Diag lesion, 100 %stenosed.  RPDA lesion, 100 %stenosed.  There is mild to moderate left ventricular systolic dysfunction.  LV end diastolic pressure is mildly elevated.  Post Atrio lesion, 80 %stenosed.  Post intervention, there is a 0% residual stenosis.  A stent was successfully placed.   1.  Patent LAD stent with mild to moderate in-stent restenosis.  Occluded first diagonal, significant disease in a small OM1, occluded right PDA with left-to-right collaterals, severe disease in the right posterior AV groove artery at the ostium supplying large posterolateral branches.  This correlated with ischemia on nuclear stress test.  2.  Mildly to moderately reduced LV systolic function with an EF of 40% and mildly elevated left ventricular end-diastolic pressure.  3.  Successful angioplasty and drug-eluting stent placement to the ostial right posterior AV groove artery.  Dual antiplatelet therapy for at least 6 months.  Aggressive treatment of risk factors.  Rest of coronary artery disease can be managed medically.  Lab work reviewed with him Total chol 165 , previously 117 but stop the Lipitor LDL 46 HBA1C 8.6,   EKG personally reviewed by myself on todays visit Shows normal sinus rhythm with  rate 85 bpm old inferior MI, PVCs  Other past medical history reviewed Stress test from January 2009 was a treadmill study, ejection fraction estimated at 45%, with medium-sized area of moderate to severe hypoperfusion involving the septal region.   PMH:   has a past medical history of Coronary artery disease, Diabetes mellitus, Hyperlipidemia, Hypertension,  Ischemic cardiomyopathy, Lung nodule, Morbid obesity ([place]), and Wegener's granulomatosis ([place]) (2007).  PSH:    Past Surgical History:  Procedure Laterality Date  . BASAL CELL CARCINOMA EXCISION     removed from face x 3  . CARDIAC CATHETERIZATION    . CARDIAC SURGERY  2000   stent in heart  . CORONARY ANGIOPLASTY  09/22/1998   s/p stent placement; Tristar 3.0 x 18 mm Ref [id number]  . CORONARY STENT INTERVENTION N/A 03/29/2017   Procedure: CORONARY STENT INTERVENTION;  Surgeon: Wellington Hampshire, MD;  Location: [facility];  Service: Cardiovascular;  Laterality: N/A;  . LEFT HEART CATH AND CORONARY ANGIOGRAPHY N/A 03/29/2017   Procedure: LEFT HEART CATH AND CORONARY ANGIOGRAPHY;  Surgeon: Wellington Hampshire, MD;  Location: [facility];  Service: Cardiovascular;  Laterality: N/A;  . 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 ULTRASOUND GUIDANCE FOR VASCULAR ACCESS  03/29/2017   Procedure: Ultrasound Guidance For Vascular Access;  Surgeon: Wellington Hampshire, MD;  Location: [facility];  Service: Cardiovascular;;    Current Outpatient Medications  Medication Sig Dispense Refill  . amLODipine (NORVASC) 10 MG tablet Take 10 mg by mouth daily.    Marland Kitchen aspirin EC 81 MG tablet Take 81 mg by mouth daily.    . Calcium Carb-Cholecalciferol 500-600 MG-UNIT TABS Take 1 tablet by mouth daily.    . carvedilol (COREG) 6.25 MG tablet Take 1 tablet (6.25 mg total) by mouth 2 (two) times daily. 180 tablet 3  . cetirizine (ZYRTEC) 10 MG tablet Take 20 mg by mouth daily.    . clobetasol cream (TEMOVATE) 4.78 % Apply 1 application topically 2 (two) times daily. To affected areas 30 g 1  . clopidogrel (PLAVIX) 75 MG tablet Take 1 tablet (75 mg total) by mouth daily. 90 tablet 3  . cyclobenzaprine (FLEXERIL) 10 MG tablet Take 1 tablet (10 mg total) by mouth 3 (three) times daily as needed for muscle spasms. 30 tablet 0  . empagliflozin (JARDIANCE) 10 MG TABS tablet Take 10  mg by mouth daily. 30 tablet 11  . glimepiride (AMARYL) 1 MG tablet Take 1 tablet (1 mg total) by mouth daily with breakfast. 30 tablet 1  . glucose blood (ONE TOUCH ULTRA TEST) test strip Check blood sugar once daily and as directed Dx E11.319 100 each 1  . hydrOXYzine (ATARAX/VISTARIL) 25 MG tablet Take 25 mg by mouth 3 (three) times daily as needed.    Marland Kitchen ibuprofen (ADVIL,MOTRIN) 200 MG tablet Take 200-800 mg by mouth every 8 (eight) hours as needed (for pain.).    Marland Kitchen LANCETS ULTRA THIN 30G MISC by Does not apply route. One Touch    . Multiple Vitamin (MULTIVITAMIN WITH MINERALS) TABS tablet Take 1 tablet by mouth daily.    . Multiple Vitamins-Minerals (AIRBORNE) TBEF Take 1 tablet by mouth daily as needed (for immune system support). Immune Health/Support    . nystatin cream (MYCOSTATIN) Apply 1 application topically 2 (two) times daily as needed for dry skin.    . predniSONE (DELTASONE) 10 MG tablet Take 1-2 tablets (10-20 mg total) by mouth daily  as needed (for wegener's granulomatosis). 30 tablet 0  . sitaGLIPtin (JANUVIA) 100 MG tablet Take 1 tablet (100 mg total) by mouth daily. 30 tablet 11  . tetrahydrozoline 0.05 % ophthalmic solution Place 1-2 drops into both eyes 3 (three) times daily as needed (for dry/irritated eyes).    . traMADol (ULTRAM) 50 MG tablet Take 1 tablet (50 mg total) by mouth every 6 (six) hours as needed for moderate pain. 30 tablet 0  . Triamcinolone Acetonide (NASACORT ALLERGY 24HR NA) Place 2 sprays into the nose at bedtime.      No current facility-administered medications for this visit.      Allergies:   Metformin and related; Benicar hct [olmesartan medoxomil-hctz]; Bystolic [nebivolol hcl]; Lovaza [omega-3-acid ethyl esters]; Niaspan [niacin er]; and Septra [bactrim]   Social History:  The patient  reports that he quit smoking about 39 years ago. His smoking use included cigarettes. He has a 10.00 pack-year smoking history. he has never used smokeless  tobacco. He reports that he does not drink alcohol or use drugs.   Family History:   family history includes Cancer in his mother; Stroke in his maternal grandfather.    Review of Systems: Review of Systems  Constitutional: Positive for malaise/fatigue.  Respiratory: Positive for shortness of breath.   Cardiovascular: Positive for chest pain.  Gastrointestinal: Negative.   Musculoskeletal: Negative.   Neurological: Negative.   Psychiatric/Behavioral: Negative.   All other systems reviewed and are negative.    PHYSICAL EXAM: VS:  BP 122/60 (BP Location: Left Arm, Patient Position: Sitting, Cuff Size: Large)   Pulse 85   Ht 5' 10.5" (1.791 m)   Wt 237 lb (107.5 kg)   BMI 33.53 kg/m  , BMI Body mass index is 33.53 kg/m. Constitutional:  oriented to person, place, and time. No distress.  HENT:  Head: Normocephalic and atraumatic.  Eyes:  no discharge. No scleral icterus.  Neck: Normal range of motion. Neck supple. No JVD present.  Cardiovascular: Normal rate, regular rhythm, normal heart sounds and intact distal pulses. Exam reveals no gallop and no friction rub. No edema No murmur heard. Pulmonary/Chest: Effort normal and breath sounds normal. No stridor. No respiratory distress.  no wheezes.  no rales.  no tenderness.  Abdominal: Soft.  no distension.  no tenderness.  Musculoskeletal: Normal range of motion.  no  tenderness or deformity.  Neurological:  normal muscle tone. Coordination normal. No atrophy Skin: Skin is warm and dry. No rash noted. not diaphoretic.  Psychiatric:  normal mood and affect. behavior is normal. Thought content normal.    Recent Labs: 11/10/2016: TSH 1.64 04/19/2017: Hemoglobin 12.7; Platelets 174 05/17/2017: ALT 23; BUN 14; Creatinine, Ser 1.01; Potassium 4.2; Sodium 134    Lipid Panel Lab Results  Component Value Date   CHOL 165 05/17/2017   HDL 36.40 (L) 05/17/2017   LDLCALC 46 11/10/2016   TRIG 391.0 (H) 05/17/2017      Wt Readings  from Last 3 Encounters:  08/01/17 237 lb (107.5 kg)  07/04/17 241 lb 12.8 oz (109.7 kg)  06/15/17 240 lb 6.4 oz (109 kg)       ASSESSMENT AND PLAN:  Atherosclerosis of native coronary artery of native heart without angina pectoris - Denies any anginal symptoms, exercising Long discussion concerning his medications and need to have more aggressive lipid management. Detailed below  Cardiomyopathy He is on ARB, spironolactone  He had intolerance,  even of Bystolic Tolerating carvedilol He does have underlying lung disease Amlodipine previously held  but he seems to be back on this  Essential hypertension He is concerned rash on his hands is from the ARB.  He will hold this for a short period of time He reports having similar problems on Benicar Back on amlodipine.  Tolerating carvedilol Blood pressure stable, we'll leave things as they are for now  Hyperlipemia Long discussion with him, stress importance of aggressive lipid management Stopped Lipitor on his own Recommended he start Zetia Also recommended he restart some milligram of Lipitor even if he does half pill daily. Seemed reluctant to do so.  Shortness of breath Stable obesity, deconditioning Feels the symptoms have improved after stenting Unable to exclude component of Wegener's.  Morbid Obesity We have encouraged continued exercise, careful diet management in an effort to lose weight. Reports having lost several pounds with exercising changing his diet  Type 2 diabetes mellitus with retinopathy without macular edema, without long-term current use of insulin, unspecified laterality, unspecified retinopathy severity ([place]) Tremendous weight gain over the past several years, Sedentary at baseline, reports exercising more recently  Disposition:   F/U  12 months   Total encounter time more than 45 minutes  Greater than 50% was spent in counseling and coordination of care with the patient    Orders Placed This  Encounter  Procedures  . EKG 12-Lead     Signed, Esmond Plants, M.D., Ph.D. 08/01/2017  [place], [place]

## 2017-08-01 ENCOUNTER — Encounter: Payer: Self-pay | Admitting: Cardiovascular Disease

## 2017-08-01 ENCOUNTER — Ambulatory Visit: Payer: Medicare HMO | Admitting: Cardiovascular Disease

## 2017-08-01 VITALS — BP 122/60 | HR 85 | Ht 70.5 in | Wt 237.0 lb

## 2017-08-01 DIAGNOSIS — I10 Essential (primary) hypertension: Secondary | ICD-10-CM | POA: Diagnosis not present

## 2017-08-01 DIAGNOSIS — I25118 Atherosclerotic heart disease of native coronary artery with other forms of angina pectoris: Secondary | ICD-10-CM | POA: Diagnosis not present

## 2017-08-01 DIAGNOSIS — I255 Ischemic cardiomyopathy: Secondary | ICD-10-CM | POA: Diagnosis not present

## 2017-08-01 DIAGNOSIS — E782 Mixed hyperlipidemia: Secondary | ICD-10-CM | POA: Diagnosis not present

## 2017-08-01 DIAGNOSIS — R0602 Shortness of breath: Secondary | ICD-10-CM | POA: Diagnosis not present

## 2017-08-01 MED ORDER — ATORVASTATIN CALCIUM 40 MG PO TABS: 40.0000 mg | ORAL_TABLET | Freq: Every day | ORAL | 3 refills | Status: DC

## 2017-08-01 MED ORDER — EZETIMIBE 10 MG PO TABS: 10.0000 mg | ORAL_TABLET | Freq: Every day | ORAL | 3 refills | Status: DC

## 2017-08-01 NOTE — Patient Instructions (Signed)
Medication Instructions:   Consider restarting the lipitor  Call the office if you would like ZETIA one a day (not a statin), drops numbers 20%  Labwork:  No new labs needed  Testing/Procedures:  No further testing at this time   Follow-Up: It was a pleasure seeing you in the office today. Please call us if you have new issues that need to be addressed before your next appt.  [phone]  Your physician wants you to follow-up in: 12 months.  You will receive a reminder letter in the mail two months in advance. If you don't receive a letter, please call our office to schedule the follow-up appointment.  If you need a refill on your cardiac medications before your next appointment, please call your pharmacy.  For educational health videos Log in to : [email or web address] Or : [email or web address], password : [email or web address]

## 2017-08-02 ENCOUNTER — Ambulatory Visit (INDEPENDENT_AMBULATORY_CARE_PROVIDER_SITE_OTHER): Payer: Medicare HMO | Admitting: Endocrinology

## 2017-08-02 ENCOUNTER — Encounter: Payer: Self-pay | Admitting: Endocrinology

## 2017-08-02 ENCOUNTER — Ambulatory Visit: Payer: Medicare HMO | Admitting: Endocrinology

## 2017-08-02 VITALS — BP 130/82 | HR 73 | Wt 240.4 lb

## 2017-08-02 DIAGNOSIS — R69 Illness, unspecified: Secondary | ICD-10-CM | POA: Diagnosis not present

## 2017-08-02 DIAGNOSIS — E11319 Type 2 diabetes mellitus with unspecified diabetic retinopathy without macular edema: Secondary | ICD-10-CM

## 2017-08-02 LAB — POCT GLYCOSYLATED HEMOGLOBIN (HGB A1C): HEMOGLOBIN A1C: 7.5

## 2017-08-02 MED ORDER — EMPAGLIFLOZIN 10 MG PO TABS: 5.0000 mg | ORAL_TABLET | Freq: Every day | ORAL | 11 refills | Status: DC

## 2017-08-02 MED ORDER — REPAGLINIDE 1 MG PO TABS: 1.0000 mg | ORAL_TABLET | Freq: Three times a day (TID) | ORAL | 11 refills | Status: DC

## 2017-08-02 MED ORDER — GLUCOSE BLOOD VI STRP: ORAL_STRIP | 1 refills | Status: DC

## 2017-08-02 MED ORDER — SITAGLIPTIN PHOSPHATE 100 MG PO TABS: 50.0000 mg | ORAL_TABLET | Freq: Every day | ORAL | 11 refills | Status: DC

## 2017-08-02 NOTE — Progress Notes (Signed)
Subjective:    Patient ID: Ryan Morgan, male    DOB: May 25, 1941, 77 y.o.   MRN: [id number]  HPI Pt returns for f/u of diabetes mellitus: DM type: 2 Dx'ed: 7371 Complications: polyneuropathy, DR, and CAD Therapy: 3 oral meds DKA: never Severe hypoglycemia: never Pancreatitis: never Pancreatic imaging: never Other: he takes intermittent prednisone for Wegener's granulomatosis; he did not tolerate metformin (rash).  Interval history: no recent steroids.  Meter is downloaded today, and the printout is scanned into the record.  It varies from 120-255.  Almost all are checked in am Past Medical History:  Diagnosis Date  . Coronary artery disease    a. prior LAD stenting 2000; b. [place] 2003 LAD 40, D1 50; c. Lexiscan 02/2017: large defect of severe severity in the basal inferoseptal, basal inferior, mid inferoseptal, mid inferior and apical inferior location, high risk; d. LHC 03/29/17: LM nl, patent LAD stent w/ 40% ISR, D1 100,  OM1 90, RPDA 100 w/ L-R collats, post atrio 80% s/p PCI/DES    . Diabetes mellitus   . Hyperlipidemia   . Hypertension   . Ischemic cardiomyopathy    a. TTE 02/2017: EF 30-35%, diffuse HK, normal LV diastolic function parameters, mild AI/MR, RV systolic function normal, PASP normal  . Lung nodule    a. s/p prior thoracotomy  . Morbid obesity ([place])   . Wegener's granulomatosis ([place]) 2007    Past Surgical History:  Procedure Laterality Date  . BASAL CELL CARCINOMA EXCISION     removed from face x 3  . CARDIAC CATHETERIZATION    . CARDIAC SURGERY  2000   stent in heart  . CORONARY ANGIOPLASTY  09/22/1998   s/p stent placement; Tristar 3.0 x 18 mm Ref [id number]  . CORONARY STENT INTERVENTION N/A 03/29/2017   Procedure: CORONARY STENT INTERVENTION;  Surgeon: Wellington Hampshire, MD;  Location: [facility];  Service: Cardiovascular;  Laterality: N/A;  . LEFT HEART CATH AND CORONARY ANGIOGRAPHY N/A 03/29/2017   Procedure: LEFT HEART CATH AND CORONARY  ANGIOGRAPHY;  Surgeon: Wellington Hampshire, MD;  Location: [facility];  Service: Cardiovascular;  Laterality: N/A;  . 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 ULTRASOUND GUIDANCE FOR VASCULAR ACCESS  03/29/2017   Procedure: Ultrasound Guidance For Vascular Access;  Surgeon: Wellington Hampshire, MD;  Location: [facility];  Service: Cardiovascular;;    Social History   Socioeconomic History  . Marital status: Married    Spouse name: Not on file  . Number of children: Not on file  . Years of education: Not on file  . Highest education level: Not on file  Social Needs  . Financial resource strain: Not on file  . Food insecurity - worry: Not on file  . Food insecurity - inability: Not on file  . Transportation needs - medical: Not on file  . Transportation needs - non-medical: Not on file  Occupational History  . Not on file  Tobacco Use  . Smoking status: Former Smoker    Packs/day: 0.50    Years: 20.00    Pack years: 10.00    Types: Cigarettes    Last attempt to quit: 05/30/1978    Years since quitting: 39.2  . Smokeless tobacco: Never Used  Substance and Sexual Activity  . Alcohol use: No    Alcohol/week: 0.0 oz  . Drug use: No  . Sexual activity: No  Other Topics Concern  . Not  on file  Social History Narrative   Regular exercise: yes   Caffeine use: very little    Current Outpatient Medications on File Prior to Visit  Medication Sig Dispense Refill  . amLODipine (NORVASC) 10 MG tablet Take 10 mg by mouth daily.    Marland Kitchen aspirin EC 81 MG tablet Take 81 mg by mouth daily.    Marland Kitchen atorvastatin (LIPITOR) 40 MG tablet Take 1 tablet (40 mg total) by mouth daily. 90 tablet 3  . Calcium Carb-Cholecalciferol 500-600 MG-UNIT TABS Take 1 tablet by mouth daily.    . carvedilol (COREG) 6.25 MG tablet Take 1 tablet (6.25 mg total) by mouth 2 (two) times daily. 180 tablet 3  . cetirizine (ZYRTEC) 10 MG tablet Take 20 mg by mouth daily.    . clobetasol  cream (TEMOVATE) 8.56 % Apply 1 application topically 2 (two) times daily. To affected areas 30 g 1  . clopidogrel (PLAVIX) 75 MG tablet Take 1 tablet (75 mg total) by mouth daily. 90 tablet 3  . cyclobenzaprine (FLEXERIL) 10 MG tablet Take 1 tablet (10 mg total) by mouth 3 (three) times daily as needed for muscle spasms. 30 tablet 0  . ezetimibe (ZETIA) 10 MG tablet Take 1 tablet (10 mg total) by mouth daily. 90 tablet 3  . hydrOXYzine (ATARAX/VISTARIL) 25 MG tablet Take 25 mg by mouth 3 (three) times daily as needed.    Marland Kitchen ibuprofen (ADVIL,MOTRIN) 200 MG tablet Take 200-800 mg by mouth every 8 (eight) hours as needed (for pain.).    Marland Kitchen LANCETS ULTRA THIN 30G MISC by Does not apply route. One Touch    . Multiple Vitamin (MULTIVITAMIN WITH MINERALS) TABS tablet Take 1 tablet by mouth daily.    . Multiple Vitamins-Minerals (AIRBORNE) TBEF Take 1 tablet by mouth daily as needed (for immune system support). Immune Health/Support    . nystatin cream (MYCOSTATIN) Apply 1 application topically 2 (two) times daily as needed for dry skin.    . predniSONE (DELTASONE) 10 MG tablet Take 1-2 tablets (10-20 mg total) by mouth daily as needed (for wegener's granulomatosis). 30 tablet 0  . tetrahydrozoline 0.05 % ophthalmic solution Place 1-2 drops into both eyes 3 (three) times daily as needed (for dry/irritated eyes).    . traMADol (ULTRAM) 50 MG tablet Take 1 tablet (50 mg total) by mouth every 6 (six) hours as needed for moderate pain. 30 tablet 0  . Triamcinolone Acetonide (NASACORT ALLERGY 24HR NA) Place 2 sprays into the nose at bedtime.     . [DISCONTINUED] nebivolol (BYSTOLIC) 5 MG tablet Take 1 tablet (5 mg total) by mouth 2 (two) times daily. 180 tablet 3   No current facility-administered medications on file prior to visit.     Allergies  Allergen Reactions  . Metformin And Related Itching  . Benicar Hct [Olmesartan Medoxomil-Hctz]     Painful and itchy knots on palms of hands  . Bystolic  [Nebivolol Hcl]     headache  . Lovaza [Omega-3-Acid Ethyl Esters] Other (See Comments)    Unsure of reaction type  . Niaspan [Niacin Er] Other (See Comments)    flushing  . Septra [Bactrim] Hives    Family History  Problem Relation Age of Onset  . Cancer Mother        tumor on tonsil  . Stroke Maternal Grandfather   . Diabetes Neg Hx     BP 130/82 (BP Location: Left Arm, Patient Position: Sitting, Cuff Size: Normal)   Pulse 73  Wt 240 lb 6.4 oz (109 kg)   SpO2 95%   BMI 34.01 kg/m    Review of Systems He has lost weight, due to his efforts    Objective:   Physical Exam VITAL SIGNS:  See vs page GENERAL: no distress Pulses: dorsalis pedis intact bilat.   MSK: no deformity of the feet CV: 1+ bilat leg edema Skin:  no ulcer on the feet, but the skin is dry and scaly.  normal color and temp on the feet.  Neuro: sensation is intact to touch on the feet, but decreased from normal.  Ext: There is bilateral onychomycosis of the toenails.    A1c=7.5%  Lab Results  Component Value Date   CREATININE 1.01 05/17/2017   BUN 14 05/17/2017   NA 134 (L) 05/17/2017   K 4.2 05/17/2017   CL 98 05/17/2017   CO2 31 05/17/2017       Assessment & Plan:  type 2 DM, with CAD: he needs increased rx.    Patient Instructions  Please change your meds to those listed below. check your blood sugar once a day.  vary the time of day when you check, between before the 3 meals, and at bedtime.  also check if you have symptoms of your blood sugar being too high or too low.  please keep a record of the readings and bring it to your next appointment here (or you can bring the meter itself).  You can write it on any piece of paper.  please call us sooner if your blood sugar goes below 70, or if you have a lot of readings over 200. Please call or message [place] next week, to tell us how the blood sugar is doing.   Please come back for a follow-up appointment in 2 months.

## 2017-08-02 NOTE — Patient Instructions (Addendum)
Please change your meds to those listed below. check your blood sugar once a day.  vary the time of day when you check, between before the 3 meals, and at bedtime.  also check if you have symptoms of your blood sugar being too high or too low.  please keep a record of the readings and bring it to your next appointment here (or you can bring the meter itself).  You can write it on any piece of paper.  please call us sooner if your blood sugar goes below 70, or if you have a lot of readings over 200. Please call or message [place] next week, to tell us how the blood sugar is doing.   Please come back for a follow-up appointment in 2 months.

## 2017-08-24 ENCOUNTER — Encounter: Payer: Self-pay | Admitting: Endocrinology

## 2017-08-31 ENCOUNTER — Other Ambulatory Visit: Payer: Self-pay | Admitting: Family Medicine

## 2017-09-06 DIAGNOSIS — L508 Other urticaria: Secondary | ICD-10-CM | POA: Diagnosis not present

## 2017-09-06 DIAGNOSIS — X32XXXA Exposure to sunlight, initial encounter: Secondary | ICD-10-CM | POA: Diagnosis not present

## 2017-09-06 DIAGNOSIS — L570 Actinic keratosis: Secondary | ICD-10-CM | POA: Diagnosis not present

## 2017-09-12 ENCOUNTER — Ambulatory Visit (INDEPENDENT_AMBULATORY_CARE_PROVIDER_SITE_OTHER): Payer: Medicare HMO

## 2017-09-12 ENCOUNTER — Other Ambulatory Visit: Payer: Self-pay

## 2017-09-12 DIAGNOSIS — I255 Ischemic cardiomyopathy: Secondary | ICD-10-CM

## 2017-09-14 ENCOUNTER — Telehealth: Payer: Self-pay | Admitting: *Deleted

## 2017-09-14 NOTE — Telephone Encounter (Signed)
-----   Message from Theora Gianotti, NP sent at 09/13/2017  5:10 PM EDT ----- Heart squeezing function remains reduced - similar to prior echo - 30-35%.  I recommend a next available, non-urgent f/u visit with Dr. Rockey Situ to discuss further and determine whether or not EP referral is appropriate.

## 2017-09-14 NOTE — Telephone Encounter (Signed)
Spoke with patient and reviewed recommendations to come and review results with provider. He verbalized understanding and confirmed appointment for 09/19/17 at 2:20PM with Dr. Rockey Situ. He had no further questions at this time.

## 2017-09-17 NOTE — Progress Notes (Signed)
Patient ID: Ryan Morgan, male   DOB: 05/20/1941, 77 y.o.   MRN: [id number] Cardiology Office Note  Date:  09/19/2017   ID:  Ryan Morgan, DOB 19-Mar-1941, MRN [id number]  PCP:  Abner Greenspan, MD   Chief Complaint  Patient presents with  . OTHER    ABN Echo no complaints today.  Meds reviewed verbally with pt.    HPI:  [name] is a very pleasant 77 year-old gentleman with a history of  DM,  smoking hx,  coronary artery disease,  stent placed to his LAD in 2000,  (Lad stent 3.0 x 18 in 2000) repeat catheterization in January 2003 with 40% LAD, 50% diagonal disease, history of Wegener's granulomatosis, on chronic steroids diabetes. Prior thoracotomy surgery on the left for lung nodule, chronic pain on the left that is episodic, stable over several years Chronic shortness of breath, sedentary lifestyle, obesity/deconditioning Stent October 2018 for shortness of breath, chest tightness, fatigue who presents for follow up of his CAD and shortness of breath   Long discussion with him today concerning prior testing and estimation of ejection fraction  Echocardiogram October 2018 ejection fraction 30 to 35%  Stress test October 2018 prior MI Prior MI, no significant ischemia Ejection fraction 36%  Cardiac catheterization October 2018 Patent LAD stent mild to moderate in-stent restenosis Occluded diagonal #1 Occluded right PDA with left-to-right collaterals Severe disease right posterior AV groove artery with collaterals Ejection fraction 40% Successful angioplasty stent to ostial right posterior AV groove artery Ejection fraction at that time 40%  Echocardiogram September 12, 2017 Ejection fraction 30 to 35%  He presents today for consideration of defibrillator Reports that he is not particularly eager to have a defibrillator, Wife has a defibrillator placed at [facility]  Denies any significant chest pain or shortness of breath Shortness of breath improved after stent  placement Weight dramatically down by dietary changes, doing some exercise  Stopped lipitor on his own. Wife does not like statins Reports he thought the Lipitor was giving him some rash on his fingers Turns out he used a different lotion or cream and symptoms went away but did not restart the Lipitor Thinks that he can keep cholesterol low enough by changing his diet  he reports hemoglobin A1c down from 10 to feels his numbers are even better now 7  EKG personally reviewed by myself on todays visit Shows normal sinus rhythm with rate 80 bpm, PVC, consider old inferior MI  Other past medical history reviewed Stress test from January 2009 was a treadmill study, ejection fraction estimated at 45%, with medium-sized area of moderate to severe hypoperfusion involving the septal region.   PMH:   has a past medical history of Coronary artery disease, Diabetes mellitus, Hyperlipidemia, Hypertension, Ischemic cardiomyopathy, Lung nodule, Morbid obesity ([place]), and Wegener's granulomatosis ([place]) (2007).  PSH:    Past Surgical History:  Procedure Laterality Date  . BASAL CELL CARCINOMA EXCISION     removed from face x 3  . CARDIAC CATHETERIZATION    . CARDIAC SURGERY  2000   stent in heart  . CORONARY ANGIOPLASTY  09/22/1998   s/p stent placement; Tristar 3.0 x 18 mm Ref [id number]  . CORONARY STENT INTERVENTION N/A 03/29/2017   Procedure: CORONARY STENT INTERVENTION;  Surgeon: Wellington Hampshire, MD;  Location: [place] [facility];  Service: Cardiovascular;  Laterality: N/A;  . LEFT HEART CATH AND CORONARY ANGIOGRAPHY N/A 03/29/2017   Procedure: LEFT HEART CATH AND CORONARY ANGIOGRAPHY;  Surgeon: Wellington Hampshire, MD;  Location: [place] [facility];  Service: Cardiovascular;  Laterality: N/A;  . 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 ULTRASOUND GUIDANCE FOR VASCULAR ACCESS  03/29/2017   Procedure: Ultrasound Guidance For Vascular Access;  Surgeon: Wellington Hampshire, MD;   Location: [place] [facility];  Service: Cardiovascular;;    Current Outpatient Medications  Medication Sig Dispense Refill  . amLODipine (NORVASC) 10 MG tablet Take 10 mg by mouth daily.    Marland Kitchen aspirin EC 81 MG tablet Take 81 mg by mouth daily.    Marland Kitchen atorvastatin (LIPITOR) 40 MG tablet Take 1 tablet (40 mg total) by mouth daily. 90 tablet 3  . Calcium Carb-Cholecalciferol 500-600 MG-UNIT TABS Take 1 tablet by mouth daily.    . carvedilol (COREG) 6.25 MG tablet Take 1 tablet (6.25 mg total) by mouth 2 (two) times daily. 180 tablet 3  . cetirizine (ZYRTEC) 10 MG tablet Take 20 mg by mouth daily.    . clobetasol cream (TEMOVATE) 9.14 % Apply 1 application topically 2 (two) times daily. To affected areas 30 g 1  . clopidogrel (PLAVIX) 75 MG tablet Take 1 tablet (75 mg total) by mouth daily. 90 tablet 3  . cyclobenzaprine (FLEXERIL) 10 MG tablet Take 1 tablet (10 mg total) by mouth 3 (three) times daily as needed for muscle spasms. 30 tablet 0  . empagliflozin (JARDIANCE) 10 MG TABS tablet Take 5 mg by mouth daily. 15 tablet 11  . ezetimibe (ZETIA) 10 MG tablet Take 1 tablet (10 mg total) by mouth daily. 90 tablet 3  . glucose blood (ONE TOUCH ULTRA TEST) test strip Check blood sugar once daily and as directed Dx E11.319 100 each 1  . hydrOXYzine (ATARAX/VISTARIL) 25 MG tablet Take 25 mg by mouth 3 (three) times daily as needed.    Marland Kitchen ibuprofen (ADVIL,MOTRIN) 200 MG tablet Take 200-800 mg by mouth every 8 (eight) hours as needed (for pain.).    Marland Kitchen LANCETS ULTRA THIN 30G MISC by Does not apply route. One Touch    . Multiple Vitamin (MULTIVITAMIN WITH MINERALS) TABS tablet Take 1 tablet by mouth daily.    . Multiple Vitamins-Minerals (AIRBORNE) TBEF Take 1 tablet by mouth daily as needed (for immune system support). Immune Health/Support    . nystatin cream (MYCOSTATIN) Apply 1 application topically 2 (two) times daily as needed for dry skin.    . predniSONE (DELTASONE) 10 MG tablet Take 1-2 tablets  (10-20 mg total) by mouth daily as needed (for wegener's granulomatosis). 30 tablet 0  . repaglinide (PRANDIN) 1 MG tablet Take 1 tablet (1 mg total) by mouth 3 (three) times daily before meals. 90 tablet 11  . sitaGLIPtin (JANUVIA) 100 MG tablet Take 0.5 tablets (50 mg total) by mouth daily. 15 tablet 11  . tetrahydrozoline 0.05 % ophthalmic solution Place 1-2 drops into both eyes 3 (three) times daily as needed (for dry/irritated eyes).    . traMADol (ULTRAM) 50 MG tablet Take 1 tablet (50 mg total) by mouth every 6 (six) hours as needed for moderate pain. 30 tablet 0  . Triamcinolone Acetonide (NASACORT ALLERGY 24HR NA) Place 2 sprays into the nose at bedtime.      No current facility-administered medications for this visit.      Allergies:   Metformin and related; Benicar hct [olmesartan medoxomil-hctz]; Bystolic [nebivolol hcl]; Lovaza [omega-3-acid ethyl esters]; Niaspan [niacin er]; and Septra [bactrim]   Social History:  The patient  reports that he quit smoking about 39 years ago. His smoking use included cigarettes. He has a 10.00 pack-year smoking history. He has never used smokeless tobacco. He reports that he does not drink alcohol or use drugs.   Family History:   family history includes Cancer in his mother; Stroke in his maternal grandfather.    Review of Systems: Review of Systems  Constitutional: Negative.   Respiratory: Negative.   Cardiovascular: Negative.   Gastrointestinal: Negative.   Musculoskeletal: Negative.   Neurological: Negative.   Psychiatric/Behavioral: Negative.   All other systems reviewed and are negative.    PHYSICAL EXAM: VS:  BP 108/68 (BP Location: Left Arm, Patient Position: Sitting, Cuff Size: Large)   Pulse 80   Ht 5' 10.5" (1.791 m)   Wt 236 lb 12 oz (107.4 kg)   BMI 33.49 kg/m  , BMI Body mass index is 33.49 kg/m.   Constitutional:  oriented to person, place, and time. No distress. Obese no significant change in exam HENT:  Head:  Normocephalic and atraumatic.  Eyes:  no discharge. No scleral icterus.  Neck: Normal range of motion. Neck supple. No JVD present.  Cardiovascular: Normal rate, regular rhythm, normal heart sounds and intact distal pulses. Exam reveals no gallop and no friction rub. No edema No murmur heard. Pulmonary/Chest: Effort normal and breath sounds normal. No stridor. No respiratory distress.  no wheezes.  no rales.  no tenderness.  Abdominal: Soft.  no distension.  no tenderness.  Musculoskeletal: Normal range of motion.  no  tenderness or deformity.  Neurological:  normal muscle tone. Coordination normal. No atrophy Skin: Skin is warm and dry. No rash noted. not diaphoretic.  Psychiatric:  normal mood and affect. behavior is normal. Thought content normal.    Recent Labs: 11/10/2016: TSH 1.64 04/19/2017: Hemoglobin 12.7; Platelets 174 05/17/2017: ALT 23; BUN 14; Creatinine, Ser 1.01; Potassium 4.2; Sodium 134    Lipid Panel Lab Results  Component Value Date   CHOL 165 05/17/2017   HDL 36.40 (L) 05/17/2017   LDLCALC 46 11/10/2016   TRIG 391.0 (H) 05/17/2017      Wt Readings from Last 3 Encounters:  09/19/17 236 lb 12 oz (107.4 kg)  08/02/17 240 lb 6.4 oz (109 kg)  08/01/17 237 lb (107.5 kg)       ASSESSMENT AND PLAN:  Atherosclerosis of native coronary artery of native heart without angina pectoris - Previously declined statin, appears to be back on his Lipitor Goal LDL less than 70 Encouraged him to continue weight loss, dietary changes, exercise  Cardiomyopathy We will hold amlodipine in the setting of low ejection fraction Recommend he start entresto 24/26 mg BID Continue carvedilol Previously on spironolactone 25, we will restart this consider cardiac MRI after 2 months or so On medications He is concerned about being overmedicated and low blood pressure  Essential hypertension Previously held ARB out of concern for rash but rash went away on alternate medication We  will start Entresto as above for low ejection fraction 30 to 35%  continue spironolactone and carvedilol now that rash has improved  Hyperlipemia Long history of reluctance to being on a statin We have encouraged he take Zetia and Lipitor Wife previously reluctant that he take this and he feels he can do it with diet alone  Shortness of breath Improved shortness of breath with weight loss, exercise, after stent placement Unable to exclude component of Wegener's.  Morbid Obesity Hemoglobin A1c down  encouraged continued dietary changes and weight loss  Type 2 diabetes mellitus with retinopathy without macular edema, without long-term current use of insulin, unspecified laterality, unspecified retinopathy severity ([place]) Tremendous weight gain over the past several years, Sedentary at baseline,  Encouraged further weight loss  Disposition:   F/U  2 months   Total encounter time more than 45 minutes  Greater than 50% was spent in counseling and coordination of care with the patient    Orders Placed This Encounter  Procedures  . EKG 12-Lead     Signed, Esmond Plants, M.D., Ph.D. 09/19/2017  [place], [place]

## 2017-09-19 ENCOUNTER — Ambulatory Visit: Payer: Medicare HMO | Admitting: Cardiovascular Disease

## 2017-09-19 ENCOUNTER — Encounter: Payer: Self-pay | Admitting: Cardiovascular Disease

## 2017-09-19 VITALS — BP 108/68 | HR 80 | Ht 70.5 in | Wt 236.8 lb

## 2017-09-19 DIAGNOSIS — I10 Essential (primary) hypertension: Secondary | ICD-10-CM | POA: Diagnosis not present

## 2017-09-19 DIAGNOSIS — M3130 Wegener's granulomatosis without renal involvement: Secondary | ICD-10-CM | POA: Diagnosis not present

## 2017-09-19 DIAGNOSIS — E782 Mixed hyperlipidemia: Secondary | ICD-10-CM | POA: Diagnosis not present

## 2017-09-19 DIAGNOSIS — I25118 Atherosclerotic heart disease of native coronary artery with other forms of angina pectoris: Secondary | ICD-10-CM

## 2017-09-19 MED ORDER — SPIRONOLACTONE 25 MG PO TABS: 25.0000 mg | ORAL_TABLET | Freq: Every day | ORAL | 3 refills | Status: DC

## 2017-09-19 MED ORDER — SACUBITRIL-VALSARTAN 24-26 MG PO TABS: 1.0000 | ORAL_TABLET | Freq: Two times a day (BID) | ORAL | 6 refills | Status: DC

## 2017-09-19 NOTE — Patient Instructions (Addendum)
Medication Instructions:  Your physician has recommended you make the following change in your medication:  1. STOP Amlodipine 2. START Entresto 24/26 mg twice a day 3. AFTER 2 WEEKS START Spironolactone again   Labwork:  BMP in 4 to 6 weeks  Testing/Procedures:  No testing at this time   Follow-Up: It was a pleasure seeing you in the office today. Please call us if you have new issues that need to be addressed before your next appt.  [phone]  Your physician wants you to follow-up in: 2 month.    If you need a refill on your cardiac medications before your next appointment, please call your pharmacy.  For educational health videos Log in to : [email or web address] Or : [email or web address], password : [email or web address]

## 2017-09-25 ENCOUNTER — Ambulatory Visit: Payer: Medicare HMO | Admitting: Cardiovascular Disease

## 2017-10-02 ENCOUNTER — Encounter: Payer: Self-pay | Admitting: Endocrinology

## 2017-10-02 ENCOUNTER — Ambulatory Visit: Payer: Medicare HMO | Admitting: Endocrinology

## 2017-10-02 VITALS — BP 132/78 | HR 74 | Wt 235.4 lb

## 2017-10-02 DIAGNOSIS — E11319 Type 2 diabetes mellitus with unspecified diabetic retinopathy without macular edema: Secondary | ICD-10-CM

## 2017-10-02 LAB — POCT GLYCOSYLATED HEMOGLOBIN (HGB A1C): Hemoglobin A1C: 6.4

## 2017-10-02 MED ORDER — REPAGLINIDE 2 MG PO TABS: 2.0000 mg | ORAL_TABLET | Freq: Three times a day (TID) | ORAL | 3 refills | Status: DC

## 2017-10-02 NOTE — Patient Instructions (Addendum)
Please double the repaglinide, and stop the [place].   check your blood sugar once a day.  vary the time of day when you check, between before the 3 meals, and at bedtime.  also check if you have symptoms of your blood sugar being too high or too low.  please keep a record of the readings and bring it to your next appointment here (or you can bring the meter itself).  You can write it on any piece of paper.  please call us sooner if your blood sugar goes below 70, or if you have a lot of readings over 200. Please call or message [place] next week, to tell us how the blood sugar is doing.   Please come back for a follow-up appointment in 3 months.

## 2017-10-02 NOTE — Progress Notes (Addendum)
Subjective:    Patient ID: Ryan Morgan, male    DOB: 25-Jun-1940, 77 y.o.   MRN: [id number]  HPI Pt returns for f/u of diabetes mellitus: DM type: 2 Dx'ed: 5732 Complications: polyneuropathy, DR, and CAD.   Therapy: 3 oral meds DKA: never Severe hypoglycemia: never Pancreatitis: never Pancreatic imaging: never Other: he takes intermittent prednisone for Wegener's granulomatosis; he did not tolerate metformin (rash).  Interval history: no recent steroids.  Meter is downloaded today, and the printout is scanned into the record.  It varies from 98-185.  All are checked fasting.  pt states he feels well in general.  Pt says he can no longer afford [place] or jardiance.  Past Medical History:  Diagnosis Date  . Coronary artery disease    a. prior LAD stenting 2000; b. [place] 2003 LAD 40, D1 50; c. Lexiscan 02/2017: large defect of severe severity in the basal inferoseptal, basal inferior, mid inferoseptal, mid inferior and apical inferior location, high risk; d. LHC 03/29/17: LM nl, patent LAD stent w/ 40% ISR, D1 100,  OM1 90, RPDA 100 w/ L-R collats, post atrio 80% s/p PCI/DES    . Diabetes mellitus   . Hyperlipidemia   . Hypertension   . Ischemic cardiomyopathy    a. TTE 02/2017: EF 30-35%, diffuse HK, normal LV diastolic function parameters, mild AI/MR, RV systolic function normal, PASP normal  . Lung nodule    a. s/p prior thoracotomy  . Morbid obesity ([place])   . Wegener's granulomatosis ([place]) 2007    Past Surgical History:  Procedure Laterality Date  . BASAL CELL CARCINOMA EXCISION     removed from face x 3  . CARDIAC CATHETERIZATION    . CARDIAC SURGERY  2000   stent in heart  . CORONARY ANGIOPLASTY  09/22/1998   s/p stent placement; Tristar 3.0 x 18 mm Ref [id number]  . CORONARY STENT INTERVENTION N/A 03/29/2017   Procedure: CORONARY STENT INTERVENTION;  Surgeon: Wellington Hampshire, MD;  Location: [place] [facility];  Service: Cardiovascular;  Laterality: N/A;  . LEFT HEART CATH  AND CORONARY ANGIOGRAPHY N/A 03/29/2017   Procedure: LEFT HEART CATH AND CORONARY ANGIOGRAPHY;  Surgeon: Wellington Hampshire, MD;  Location: [place] [facility];  Service: Cardiovascular;  Laterality: N/A;  . 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 ULTRASOUND GUIDANCE FOR VASCULAR ACCESS  03/29/2017   Procedure: Ultrasound Guidance For Vascular Access;  Surgeon: Wellington Hampshire, MD;  Location: [place] [facility];  Service: Cardiovascular;;    Social History   Socioeconomic History  . Marital status: Married    Spouse name: Not on file  . Number of children: Not on file  . Years of education: Not on file  . Highest education level: Not on file  Occupational History  . Not on file  Social Needs  . Financial resource strain: Not on file  . Food insecurity:    Worry: Not on file    Inability: Not on file  . Transportation needs:    Medical: Not on file    Non-medical: Not on file  Tobacco Use  . Smoking status: Former Smoker    Packs/day: 0.50    Years: 20.00    Pack years: 10.00    Types: Cigarettes    Last attempt to quit: 05/30/1978    Years since quitting: 39.3  . Smokeless tobacco: Never Used  Substance and Sexual Activity  . Alcohol use: No  Alcohol/week: 0.0 oz  . Drug use: No  . Sexual activity: Never  Lifestyle  . Physical activity:    Days per week: Not on file    Minutes per session: Not on file  . Stress: Not on file  Relationships  . Social connections:    Talks on phone: Not on file    Gets together: Not on file    Attends religious service: Not on file    Active member of club or organization: Not on file    Attends meetings of clubs or organizations: Not on file    Relationship status: Not on file  . Intimate partner violence:    Fear of current or ex partner: Not on file    Emotionally abused: Not on file    Physically abused: Not on file    Forced sexual activity: Not on file  Other Topics Concern  . Not on file  Social  History Narrative   Regular exercise: yes   Caffeine use: very little    Current Outpatient Medications on File Prior to Visit  Medication Sig Dispense Refill  . aspirin EC 81 MG tablet Take 81 mg by mouth daily.    Marland Kitchen atorvastatin (LIPITOR) 40 MG tablet Take 1 tablet (40 mg total) by mouth daily. 90 tablet 3  . Calcium Carb-Cholecalciferol 500-600 MG-UNIT TABS Take 1 tablet by mouth daily.    . carvedilol (COREG) 6.25 MG tablet Take 1 tablet (6.25 mg total) by mouth 2 (two) times daily. 180 tablet 3  . cetirizine (ZYRTEC) 10 MG tablet Take 20 mg by mouth daily.    . clobetasol cream (TEMOVATE) 0.93 % Apply 1 application topically 2 (two) times daily. To affected areas 30 g 1  . clopidogrel (PLAVIX) 75 MG tablet Take 1 tablet (75 mg total) by mouth daily. 90 tablet 3  . cyclobenzaprine (FLEXERIL) 10 MG tablet Take 1 tablet (10 mg total) by mouth 3 (three) times daily as needed for muscle spasms. 30 tablet 0  . ezetimibe (ZETIA) 10 MG tablet Take 1 tablet (10 mg total) by mouth daily. 90 tablet 3  . glucose blood (ONE TOUCH ULTRA TEST) test strip Check blood sugar once daily and as directed Dx E11.319 100 each 1  . hydrOXYzine (ATARAX/VISTARIL) 25 MG tablet Take 25 mg by mouth 3 (three) times daily as needed.    Marland Kitchen ibuprofen (ADVIL,MOTRIN) 200 MG tablet Take 200-800 mg by mouth every 8 (eight) hours as needed (for pain.).    Marland Kitchen LANCETS ULTRA THIN 30G MISC by Does not apply route. One Touch    . Multiple Vitamin (MULTIVITAMIN WITH MINERALS) TABS tablet Take 1 tablet by mouth daily.    . Multiple Vitamins-Minerals (AIRBORNE) TBEF Take 1 tablet by mouth daily as needed (for immune system support). Immune Health/Support    . nystatin cream (MYCOSTATIN) Apply 1 application topically 2 (two) times daily as needed for dry skin.    . predniSONE (DELTASONE) 10 MG tablet Take 1-2 tablets (10-20 mg total) by mouth daily as needed (for wegener's granulomatosis). 30 tablet 0  . sacubitril-valsartan (ENTRESTO)  24-26 MG Take 1 tablet by mouth 2 (two) times daily. 60 tablet 6  . spironolactone (ALDACTONE) 25 MG tablet Take 1 tablet (25 mg total) by mouth daily. 90 tablet 3  . tetrahydrozoline 0.05 % ophthalmic solution Place 1-2 drops into both eyes 3 (three) times daily as needed (for dry/irritated eyes).    . traMADol (ULTRAM) 50 MG tablet Take 1 tablet (50 mg  total) by mouth every 6 (six) hours as needed for moderate pain. 30 tablet 0  . Triamcinolone Acetonide (NASACORT ALLERGY 24HR NA) Place 2 sprays into the nose at bedtime.     . [DISCONTINUED] nebivolol (BYSTOLIC) 5 MG tablet Take 1 tablet (5 mg total) by mouth 2 (two) times daily. 180 tablet 3   No current facility-administered medications on file prior to visit.     Allergies  Allergen Reactions  . Metformin And Related Itching  . Benicar Hct [Olmesartan Medoxomil-Hctz]     Painful and itchy knots on palms of hands  . Bystolic [Nebivolol Hcl]     headache  . Lovaza [Omega-3-Acid Ethyl Esters] Other (See Comments)    Unsure of reaction type  . Niaspan [Niacin Er] Other (See Comments)    flushing  . Septra [Bactrim] Hives    Family History  Problem Relation Age of Onset  . Cancer Mother        tumor on tonsil  . Stroke Maternal Grandfather   . Diabetes Neg Hx     BP 132/78   Pulse 74   Wt 235 lb 6.4 oz (106.8 kg)   SpO2 96%   BMI 33.30 kg/m   Review of Systems He denies hypoglycemia    Objective:   Physical Exam VITAL SIGNS:  See vs page GENERAL: no distress Pulses: foot pulses are intact bilaterally.   MSK: no deformity of the feet or ankles.  CV: 1+ bilat edema of the legs.   Skin:  no ulcer on the feet or ankles.  normal color and temp on the feet and ankles Neuro: sensation is intact to touch on the feet and ankles, but decreased from normal.     Lab Results  Component Value Date   CREATININE 1.01 05/17/2017   BUN 14 05/17/2017   NA 134 (L) 05/17/2017   K 4.2 05/17/2017   CL 98 05/17/2017   CO2 31  05/17/2017    Lab Results  Component Value Date   HGBA1C 6.4 10/02/2017       Assessment & Plan:  Type 2 DM, with CAD: therapy limited by pt's request for least expensive meds  Patient Instructions  Please double the repaglinide, and stop the [place].   check your blood sugar once a day.  vary the time of day when you check, between before the 3 meals, and at bedtime.  also check if you have symptoms of your blood sugar being too high or too low.  please keep a record of the readings and bring it to your next appointment here (or you can bring the meter itself).  You can write it on any piece of paper.  please call us sooner if your blood sugar goes below 70, or if you have a lot of readings over 200. Please call or message [place] next week, to tell us how the blood sugar is doing.   Please come back for a follow-up appointment in 3 months.

## 2017-10-17 DIAGNOSIS — R69 Illness, unspecified: Secondary | ICD-10-CM | POA: Diagnosis not present

## 2017-11-01 ENCOUNTER — Other Ambulatory Visit
Admission: RE | Admit: 2017-11-01 | Discharge: 2017-11-01 | Disposition: A | Discharge status: Home / Self Care | Payer: Medicare HMO | Source: Ambulatory Visit | Attending: Cardiovascular Disease | Admitting: Cardiovascular Disease

## 2017-11-01 DIAGNOSIS — I25118 Atherosclerotic heart disease of native coronary artery with other forms of angina pectoris: Secondary | ICD-10-CM | POA: Insufficient documentation

## 2017-11-01 DIAGNOSIS — E782 Mixed hyperlipidemia: Secondary | ICD-10-CM | POA: Diagnosis not present

## 2017-11-01 LAB — BASIC METABOLIC PANEL
Anion gap: 8 (ref 5–15)
BUN: 15 mg/dL (ref 6–20)
CALCIUM: 8.7 mg/dL — AB (ref 8.9–10.3)
CO2: 26 mmol/L (ref 22–32)
Chloride: 105 mmol/L (ref 101–111)
Creatinine, Ser: 1.04 mg/dL (ref 0.61–1.24)
GFR calc Af Amer: >60 mL/min (ref >60)
GLUCOSE: 154 mg/dL — AB (ref 65–99)
Potassium: 4.2 mmol/L (ref 3.5–5.1)
Sodium: 139 mmol/L (ref 135–145)

## 2017-11-07 NOTE — Progress Notes (Signed)
Patient ID: Ryan Morgan, male   DOB: 1940/06/12, 77 y.o.   MRN: [id number] Cardiology Office Note  Date:  11/08/2017   ID:  Ryan Morgan, DOB [birth year], MRN [id number]  PCP:  Abner Greenspan, MD   Chief Complaint  Patient presents with  . other    2 month f/u no complaints today. Meds reviewed verbally with pt.    HPI:  Mr. Pavek is a very pleasant 77 year-old gentleman with a history of  DM,  smoking hx,  coronary artery disease,  stent placed to his LAD in 2000,  (Lad stent 3.0 x 18 in 2000) repeat catheterization in January 2003 with 40% LAD, 50% diagonal disease, history of Wegener's granulomatosis, on chronic steroids diabetes. Prior thoracotomy surgery on the left for lung nodule, chronic pain on the left that is episodic, stable over several years Chronic shortness of breath, sedentary lifestyle, obesity/deconditioning Stent October 2018 for shortness of breath, chest tightness, fatigue Echocardiogram September 12, 2017 Ejection fraction 30 to 35% who presents for follow up of his CAD and shortness of breath   On his last clinic visit medication changes were made  amlodipine Held in the setting of low ejection fraction  started on entresto 24/26 mg BID Continue carvedilol, restarted on spironolactone 25 On this regimen he reports that he feels well He has watched his diet closely with self-reported weight loss pounds Previous discussion concerning defibrillator, and whether to perform cardiac MRI  No regular exercise program Denies any anginal symptoms  EKG personally reviewed by myself on todays visit Shows normal sinus rhythm with rate 70 bpm rare APC no significant ST-T wave changes unable to exclude old inferior MI  Other past medical history reviewed Echocardiogram October 2018 ejection fraction 30 to 35%  Stress test October 2018 prior MI Prior MI, no significant ischemia Ejection fraction 36%  Cardiac catheterization October 2018 Patent LAD stent mild to  moderate in-stent restenosis Occluded diagonal #1 Occluded right PDA with left-to-right collaterals Severe disease right posterior AV groove artery with collaterals Ejection fraction 40% Successful angioplasty stent to ostial right posterior AV groove artery Ejection fraction at that time 40%  Echocardiogram September 12, 2017 Ejection fraction 30 to 35%  Stopped lipitor on his own. Wife does not like statins Reports he thought the Lipitor was giving him some rash on his fingers Turns out he used a different lotion or cream and symptoms went away but did not restart the Lipitor Thinks that he can keep cholesterol low enough by changing his diet  he reports hemoglobin A1c down from 10 to feels his numbers are even better now 7  Stress test from January 2009 was a treadmill study, ejection fraction estimated at 45%, with medium-sized area of moderate to severe hypoperfusion involving the septal region.   PMH:   has a past medical history of Coronary artery disease, Diabetes mellitus, Hyperlipidemia, Hypertension, Ischemic cardiomyopathy, Lung nodule, Morbid obesity ([place]), and Wegener's granulomatosis ([place]) (2007).  PSH:    Past Surgical History:  Procedure Laterality Date  . BASAL CELL CARCINOMA EXCISION     removed from face x 3  . CARDIAC CATHETERIZATION    . CARDIAC SURGERY  2000   stent in heart  . CORONARY ANGIOPLASTY  09/22/1998   s/p stent placement; [facility] 3.0 x 18 mm Ref [id number]  . CORONARY STENT INTERVENTION N/A 03/29/2017   Procedure: CORONARY STENT INTERVENTION;  Surgeon: Wellington Hampshire, MD;  Location: [place] [place] [facility];  Service: Cardiovascular;  Laterality: N/A;  . LEFT HEART CATH AND CORONARY ANGIOGRAPHY N/A 03/29/2017   Procedure: LEFT HEART CATH AND CORONARY ANGIOGRAPHY;  Surgeon: Wellington Hampshire, MD;  Location: [facility];  Service: Cardiovascular;  Laterality: N/A;  . 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 ULTRASOUND GUIDANCE  FOR VASCULAR ACCESS  03/29/2017   Procedure: Ultrasound Guidance For Vascular Access;  Surgeon: Wellington Hampshire, MD;  Location: [facility];  Service: Cardiovascular;;    Current Outpatient Medications  Medication Sig Dispense Refill  . aspirin EC 81 MG tablet Take 81 mg by mouth daily.    Marland Kitchen atorvastatin (LIPITOR) 20 MG tablet Take 20 mg by mouth daily.    . carvedilol (COREG) 6.25 MG tablet Take 1 tablet (6.25 mg total) by mouth 2 (two) times daily. 180 tablet 3  . cetirizine (ZYRTEC) 10 MG tablet Take 20 mg by mouth daily.    . clobetasol cream (TEMOVATE) 2.70 % Apply 1 application topically 2 (two) times daily. To affected areas 30 g 1  . clopidogrel (PLAVIX) 75 MG tablet Take 1 tablet (75 mg total) by mouth daily. 90 tablet 3  . cyclobenzaprine (FLEXERIL) 10 MG tablet Take 1 tablet (10 mg total) by mouth 3 (three) times daily as needed for muscle spasms. 30 tablet 0  . diphenhydrAMINE (BENADRYL) 25 MG tablet Take 25 mg by mouth every 6 (six) hours as needed.    . ezetimibe (ZETIA) 10 MG tablet Take 1 tablet (10 mg total) by mouth daily. 90 tablet 3  . fexofenadine (ALLEGRA) 180 MG tablet Take 180 mg by mouth daily.    Marland Kitchen glucose blood (ONE TOUCH ULTRA TEST) test strip Check blood sugar once daily and as directed Dx E11.319 100 each 1  . hydroxychloroquine (PLAQUENIL) 200 MG tablet Take 200 mg by mouth 2 (two) times daily.    . hydrOXYzine (ATARAX/VISTARIL) 25 MG tablet Take 25 mg by mouth 3 (three) times daily as needed.    Marland Kitchen ibuprofen (ADVIL,MOTRIN) 200 MG tablet Take 200-800 mg by mouth every 8 (eight) hours as needed (for pain.).    Marland Kitchen LANCETS ULTRA THIN 30G MISC by Does not apply route. One Touch    . Multiple Vitamin (MULTIVITAMIN WITH MINERALS) TABS tablet Take 1 tablet by mouth daily.    . Multiple Vitamins-Minerals (AIRBORNE) TBEF Take 1 tablet by mouth daily as needed (for immune system support). Immune Health/Support    . nystatin cream (MYCOSTATIN) Apply 1 application  topically 2 (two) times daily as needed for dry skin.    . predniSONE (DELTASONE) 10 MG tablet Take 1-2 tablets (10-20 mg total) by mouth daily as needed (for wegener's granulomatosis). 30 tablet 0  . repaglinide (PRANDIN) 2 MG tablet Take 1 tablet (2 mg total) by mouth 3 (three) times daily before meals. 270 tablet 3  . sitaGLIPtin (JANUVIA) 100 MG tablet Take 50 mg by mouth daily.    Marland Kitchen spironolactone (ALDACTONE) 25 MG tablet Take 1 tablet (25 mg total) by mouth daily. 90 tablet 3  . tetrahydrozoline 0.05 % ophthalmic solution Place 1-2 drops into both eyes 3 (three) times daily as needed (for dry/irritated eyes).    . traMADol (ULTRAM) 50 MG tablet Take 1 tablet (50 mg total) by mouth every 6 (six) hours as needed for moderate pain. 30 tablet 0  . Triamcinolone Acetonide (NASACORT ALLERGY 24HR NA) Place 2 sprays into the nose at bedtime.     . sacubitril-valsartan (ENTRESTO) 49-51  MG Take 1 tablet by mouth 2 (two) times daily. 60 tablet 11   No current facility-administered medications for this visit.      Allergies:   Metformin and related; Benicar hct [olmesartan medoxomil-hctz]; Bystolic [nebivolol hcl]; Lovaza [omega-3-acid ethyl esters]; Niaspan [niacin er]; and Septra [bactrim]   Social History:  The patient  reports that he quit smoking about 39 years ago. His smoking use included cigarettes. He has a 10.00 pack-year smoking history. He has never used smokeless tobacco. He reports that he does not drink alcohol or use drugs.   Family History:   family history includes Cancer in his mother; Stroke in his maternal grandfather.    Review of Systems: Review of Systems  Constitutional: Negative.   Respiratory: Negative.   Cardiovascular: Negative.   Gastrointestinal: Negative.   Musculoskeletal: Negative.   Neurological: Negative.   Psychiatric/Behavioral: Negative.   All other systems reviewed and are negative.    PHYSICAL EXAM: VS:  BP 136/78 (BP Location: Left Arm, Patient  Position: Sitting, Cuff Size: Large)   Pulse 70   Ht 5\' 10"  (1.778 m)   Wt 230 lb (104.3 kg)   BMI 33.00 kg/m  , BMI Body mass index is 33 kg/m.  Does significant change in exam Constitutional:  oriented to person, place, and time. No distress. Obese no significant change in exam HENT:  Head: Normocephalic and atraumatic.  Eyes:  no discharge. No scleral icterus.  Neck: Normal range of motion. Neck supple. No JVD present.  Cardiovascular: Normal rate, regular rhythm, normal heart sounds and intact distal pulses. Exam reveals no gallop and no friction rub. No edema No murmur heard. Pulmonary/Chest: Effort normal and breath sounds normal. No stridor. No respiratory distress.  no wheezes.  no rales.  no tenderness.  Abdominal: Soft.  no distension.  no tenderness.  Musculoskeletal: Normal range of motion.  no  tenderness or deformity.  Neurological:  normal muscle tone. Coordination normal. No atrophy Skin: Skin is warm and dry. No rash noted. not diaphoretic.  Psychiatric:  normal mood and affect. behavior is normal. Thought content normal.    Recent Labs: 11/10/2016: TSH 1.64 04/19/2017: Hemoglobin 12.7; Platelets 174 05/17/2017: ALT 23 11/01/2017: BUN 15; Creatinine, Ser 1.04; Potassium 4.2; Sodium 139    Lipid Panel Lab Results  Component Value Date   CHOL 165 05/17/2017   HDL 36.40 (L) 05/17/2017   LDLCALC 46 11/10/2016   TRIG 391.0 (H) 05/17/2017      Wt Readings from Last 3 Encounters:  11/08/17 230 lb (104.3 kg)  10/02/17 235 lb 6.4 oz (106.8 kg)  09/19/17 236 lb 12 oz (107.4 kg)       ASSESSMENT AND PLAN:  Atherosclerosis of native coronary artery of native heart without angina pectoris - Previously declined statin, recommended he stay on Lipitor Goal LDL less than 70 Recommended continued weight loss  Cardiomyopathy Long discussion with him concerning various treatment options Suggested we increase his entresto up to 49/51 mg BID Continue carvedilol  spironolactone 25,   consider cardiac MRI after 2 months or so  Essential hypertension Medication changes as above  Hyperlipemia Long history of reluctance to being on a statin encouraged he take Zetia and Lipitor Wife previously reluctant that he take this and he feels he can do it with diet alone  Shortness of breath Improved shortness of breath with weight loss, exercise, after stent placement Unable to exclude component of Wegener's. stable  Morbid Obesity Hemoglobin A1c down  Losing weight slowly  Type 2 diabetes mellitus with retinopathy without macular edema, without long-term current use of insulin, unspecified laterality, unspecified retinopathy severity ([place]) Tremendous weight gain over the past several years, Sedentary at baseline,  Slow weight loss over the past 6 months  Disposition:   F/U  6 months   Total encounter time more than 25 minutes  Greater than 50% was spent in counseling and coordination of care with the patient    Orders Placed This Encounter  Procedures  . EKG 12-Lead     Signed, Esmond Plants, M.D., Ph.D. 11/08/2017  [place], [place]

## 2017-11-08 ENCOUNTER — Encounter: Payer: Self-pay | Admitting: Cardiovascular Disease

## 2017-11-08 ENCOUNTER — Ambulatory Visit: Payer: Medicare HMO | Admitting: Cardiovascular Disease

## 2017-11-08 VITALS — BP 136/78 | HR 70 | Ht 70.0 in | Wt 230.0 lb

## 2017-11-08 DIAGNOSIS — M3130 Wegener's granulomatosis without renal involvement: Secondary | ICD-10-CM | POA: Diagnosis not present

## 2017-11-08 DIAGNOSIS — E782 Mixed hyperlipidemia: Secondary | ICD-10-CM | POA: Diagnosis not present

## 2017-11-08 DIAGNOSIS — I25118 Atherosclerotic heart disease of native coronary artery with other forms of angina pectoris: Secondary | ICD-10-CM

## 2017-11-08 DIAGNOSIS — R0602 Shortness of breath: Secondary | ICD-10-CM

## 2017-11-08 DIAGNOSIS — R0789 Other chest pain: Secondary | ICD-10-CM

## 2017-11-08 DIAGNOSIS — E11319 Type 2 diabetes mellitus with unspecified diabetic retinopathy without macular edema: Secondary | ICD-10-CM | POA: Diagnosis not present

## 2017-11-08 MED ORDER — SACUBITRIL-VALSARTAN 49-51 MG PO TABS: 1.0000 | ORAL_TABLET | Freq: Two times a day (BID) | ORAL | 11 refills | Status: DC

## 2017-11-08 NOTE — Patient Instructions (Addendum)
Medication Instructions:   Please increase the entresto up to 49/51 mg twice a day Medication Samples have been provided to the patient.  Drug name: Delene Loll       Strength: 49/51 mg        Qty: 1 box  LOT: [id number]  Exp.Date: 05/2019   Labwork:  No new labs needed  Testing/Procedures:  No further testing at this time   Follow-Up: It was a pleasure seeing you in the office today. Please call us if you have new issues that need to be addressed before your next appt.  [phone]  Your physician wants you to follow-up in: 6 months.  You will receive a reminder letter in the mail two months in advance. If you don't receive a letter, please call our office to schedule the follow-up appointment.  If you need a refill on your cardiac medications before your next appointment, please call your pharmacy.  For educational health videos Log in to : [email or web address] Or : [email or web address], password : [email or web address]

## 2017-11-20 ENCOUNTER — Ambulatory Visit (INDEPENDENT_AMBULATORY_CARE_PROVIDER_SITE_OTHER): Payer: Medicare HMO

## 2017-11-20 ENCOUNTER — Telehealth: Payer: Self-pay | Admitting: Family Medicine

## 2017-11-20 VITALS — BP 112/82 | HR 82 | Temp 98.4°F | Ht 69.5 in | Wt 229.0 lb

## 2017-11-20 DIAGNOSIS — E782 Mixed hyperlipidemia: Secondary | ICD-10-CM

## 2017-11-20 DIAGNOSIS — Z0000 Encounter for general adult medical examination without abnormal findings: Secondary | ICD-10-CM

## 2017-11-20 DIAGNOSIS — I10 Essential (primary) hypertension: Secondary | ICD-10-CM

## 2017-11-20 DIAGNOSIS — Z125 Encounter for screening for malignant neoplasm of prostate: Secondary | ICD-10-CM

## 2017-11-20 DIAGNOSIS — E11319 Type 2 diabetes mellitus with unspecified diabetic retinopathy without macular edema: Secondary | ICD-10-CM

## 2017-11-20 DIAGNOSIS — E11311 Type 2 diabetes mellitus with unspecified diabetic retinopathy with macular edema: Secondary | ICD-10-CM

## 2017-11-20 LAB — COMPREHENSIVE METABOLIC PANEL
ALT: 14 U/L (ref 0–53)
AST: 14 U/L (ref 0–37)
Albumin: 4 g/dL (ref 3.5–5.2)
Alkaline Phosphatase: 64 U/L (ref 39–117)
BILIRUBIN TOTAL: 0.5 mg/dL (ref 0.2–1.2)
BUN: 11 mg/dL (ref 6–23)
CALCIUM: 8.9 mg/dL (ref 8.4–10.5)
CO2: 27 meq/L (ref 19–32)
CREATININE: 0.88 mg/dL (ref 0.40–1.50)
Chloride: 105 mEq/L (ref 96–112)
GFR: 89.66 mL/min (ref >60.00)
Glucose, Bld: 190 mg/dL — ABNORMAL HIGH (ref 70–99)
Potassium: 4.2 mEq/L (ref 3.5–5.1)
Sodium: 140 mEq/L (ref 135–145)
Total Protein: 6.1 g/dL (ref 6.0–8.3)

## 2017-11-20 LAB — CBC WITH DIFFERENTIAL/PLATELET
Basophils Absolute: 0 10*3/uL (ref 0.0–0.1)
Basophils Relative: 0.8 % (ref 0.0–3.0)
EOS ABS: 0.1 10*3/uL (ref 0.0–0.7)
Eosinophils Relative: 2.2 % (ref 0.0–5.0)
HEMATOCRIT: 42.1 % (ref 39.0–52.0)
HEMOGLOBIN: 14.6 g/dL (ref 13.0–17.0)
LYMPHS ABS: 0.9 10*3/uL (ref 0.7–4.0)
Lymphocytes Relative: 24 % (ref 12.0–46.0)
MCHC: 34.7 g/dL (ref 30.0–36.0)
MCV: 90.8 fl (ref 78.0–100.0)
MONO ABS: 0.3 10*3/uL (ref 0.1–1.0)
Monocytes Relative: 6.8 % (ref 3.0–12.0)
NEUTROS ABS: 2.5 10*3/uL (ref 1.4–7.7)
Neutrophils Relative %: 66.2 % (ref 43.0–77.0)
Platelets: 169 10*3/uL (ref 150.0–400.0)
RBC: 4.63 Mil/uL (ref 4.22–5.81)
RDW: 14.5 % (ref 11.5–15.5)
WBC: 3.8 10*3/uL — ABNORMAL LOW (ref 4.0–10.5)

## 2017-11-20 LAB — LIPID PANEL
CHOL/HDL RATIO: 2
Cholesterol: 99 mg/dL (ref 0–200)
HDL: 41.8 mg/dL (ref >39.00)
LDL Cholesterol: 38 mg/dL (ref 0–99)
NonHDL: 57.47
TRIGLYCERIDES: 96 mg/dL (ref 0.0–149.0)
VLDL: 19.2 mg/dL (ref 0.0–40.0)

## 2017-11-20 LAB — PSA, MEDICARE: PSA: 0.98 ng/mL (ref 0.10–4.00)

## 2017-11-20 LAB — MICROALBUMIN / CREATININE URINE RATIO
Creatinine,U: 67.9 mg/dL
MICROALB/CREAT RATIO: 1 mg/g (ref 0.0–30.0)
Microalb, Ur: <0.7 mg/dL (ref 0.0–1.9)

## 2017-11-20 LAB — TSH: TSH: 0.91 u[IU]/mL (ref 0.35–4.50)

## 2017-11-20 NOTE — Patient Instructions (Signed)
Ryan Morgan , Thank you for taking time to come for your Medicare Wellness Visit. I appreciate your ongoing commitment to your health goals. Please review the following plan we discussed and let me know if I can assist you in the future.   These are the goals we discussed: Goals    . Increase physical activity     Starting 11/20/2017, I will continue to exercise on stationary bike for at least 20-60 minutes daily, except when travelling.        This is a list of the screening recommended for you and due dates:  Health Maintenance  Topic Date Due  . Urine Protein Check  11/21/2027*  . Eye exam for diabetics  12/23/2017  . Flu Shot  12/28/2017  . Hemoglobin A1C  04/04/2018  . Tetanus Vaccine  05/30/2018  . Complete foot exam   10/03/2018  . Colon Cancer Screening  07/29/2024  . Pneumonia vaccines  Completed  *Topic was postponed. The date shown is not the original due date.   Preventive Care for Adults  A healthy lifestyle and preventive care can promote health and wellness. Preventive health guidelines for adults include the following key practices.  . A routine yearly physical is a good way to check with your health care provider about your health and preventive screening. It is a chance to share any concerns and updates on your health and to receive a thorough exam.  . Visit your dentist for a routine exam and preventive care every 6 months. Brush your teeth twice a day and floss once a day. Good oral hygiene prevents tooth decay and gum disease.  . The frequency of eye exams is based on your age, health, family medical history, use  of contact lenses, and other factors. Follow your health care provider's recommendations for frequency of eye exams.  . Eat a healthy diet. Foods like vegetables, fruits, whole grains, low-fat dairy products, and lean protein foods contain the nutrients you need without too many calories. Decrease your intake of foods high in solid fats, added sugars,  and salt. Eat the right amount of calories for you. Get information about a proper diet from your health care provider, if necessary.  . Regular physical exercise is one of the most important things you can do for your health. Most adults should get at least 150 minutes of moderate-intensity exercise (any activity that increases your heart rate and causes you to sweat) each week. In addition, most adults need muscle-strengthening exercises on 2 or more days a week.  Silver Sneakers may be a benefit available to you. To determine eligibility, you may visit the website: www.silversneakers.com or contact program at 7705455255 Mon-Fri between 8AM-8PM.   . Maintain a healthy weight. The body mass index (BMI) is a screening tool to identify possible weight problems. It provides an estimate of body fat based on height and weight. Your health care provider can find your BMI and can help you achieve or maintain a healthy weight.   For adults 20 years and older: ? A BMI below 18.5 is considered underweight. ? A BMI of 18.5 to 24.9 is normal. ? A BMI of 25 to 29.9 is considered overweight. ? A BMI of 30 and above is considered obese.   . Maintain normal blood lipids and cholesterol levels by exercising and minimizing your intake of saturated fat. Eat a balanced diet with plenty of fruit and vegetables. Blood tests for lipids and cholesterol should begin at age 9 and  be repeated every 5 years. If your lipid or cholesterol levels are high, you are over 50, or you are at high risk for heart disease, you may need your cholesterol levels checked more frequently. Ongoing high lipid and cholesterol levels should be treated with medicines if diet and exercise are not working.  . If you smoke, find out from your health care provider how to quit. If you do not use tobacco, please do not start.  . If you choose to drink alcohol, please do not consume more than 2 drinks per day. One drink is considered to be 12  ounces (355 mL) of beer, 5 ounces (148 mL) of wine, or 1.5 ounces (44 mL) of liquor.  . If you are 44-61 years old, ask your health care provider if you should take aspirin to prevent strokes.  . Use sunscreen. Apply sunscreen liberally and repeatedly throughout the day. You should seek shade when your shadow is shorter than you. Protect yourself by wearing long sleeves, pants, a wide-brimmed hat, and sunglasses year round, whenever you are outdoors.  . Once a month, do a whole body skin exam, using a mirror to look at the skin on your back. Tell your health care provider of new moles, moles that have irregular borders, moles that are larger than a pencil eraser, or moles that have changed in shape or color.

## 2017-11-20 NOTE — Telephone Encounter (Signed)
-----   Message from Eustace Pen, LPN sent at [id number]/98/1191  1:27 PM EDT ----- Regarding: Labs 6/24 Lab orders needed. Thank you.  Insurance:  [employer]  Needs microalbumin per health maintenance

## 2017-11-22 ENCOUNTER — Other Ambulatory Visit: Payer: Self-pay

## 2017-11-22 NOTE — Progress Notes (Signed)
Subjective:   Ryan Morgan is a 77 y.o. male who presents for Medicare Annual/Subsequent preventive examination.  Review of Systems:  N/A Cardiac Risk Factors include: advanced age (>62men, >31 women);obesity (BMI >30kg/m2);diabetes mellitus;dyslipidemia;hypertension;male gender     Objective:    Vitals: BP 112/82 (BP Location: Right Arm, Patient Position: Sitting, Cuff Size: Normal)   Pulse 82   Temp 98.4 F (36.9 C) (Oral)   Ht 5' 9.5" (1.765 m) Comment: no shoes  Wt 229 lb (103.9 kg)   SpO2 95%   BMI 33.33 kg/m   Body mass index is 33.33 kg/m.  Advanced Directives 11/20/2017 03/29/2017 11/15/2016 10/09/2015  Does Patient Have a Medical Advance Directive? No No No No  Would patient like information on creating a medical advance directive? Yes (MAU/Ambulatory/Procedural Areas - Information given) No - Patient declined - Yes - Educational materials given    Tobacco Social History   Tobacco Use  Smoking Status Former Smoker  . Packs/day: 0.50  . Years: 20.00  . Pack years: 10.00  . Types: Cigarettes  . Last attempt to quit: 05/30/1978  . Years since quitting: 39.5  Smokeless Tobacco Never Used     Counseling given: No   Clinical Intake:  Pre-visit preparation completed: Yes  Pain : No/denies pain Pain Score: 0-No pain     Nutritional Status: BMI > 30  Obese Nutritional Risks: None Diabetes: Yes CBG done?: No Did pt. bring in CBG monitor from home?: No  How often do you need to have someone help you when you read instructions, pamphlets, or other written materials from your doctor or pharmacy?: 1 - Never What is the last grade level you completed in school?: Associates  Interpreter Needed?: No  Comments: pt lives iwth spouse Information entered by :: LPinson, LPN  Past Medical History:  Diagnosis Date  . Coronary artery disease    a. prior LAD stenting 2000; b. [place] 2003 LAD 40, D1 50; c. Lexiscan 02/2017: large defect of severe severity in the basal  inferoseptal, basal inferior, mid inferoseptal, mid inferior and apical inferior location, high risk; d. [facility] 03/29/17: LM nl, patent LAD stent w/ 40% ISR, D1 100,  OM1 90, RPDA 100 w/ L-R collats, post atrio 80% s/p PCI/DES    . Diabetes mellitus   . Hyperlipidemia   . Hypertension   . Ischemic cardiomyopathy    a. TTE 02/2017: EF 30-35%, diffuse HK, normal LV diastolic function parameters, mild AI/MR, RV systolic function normal, PASP normal  . Lung nodule    a. s/p prior thoracotomy  . Morbid obesity ([place])   . 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 BASAL CELL CARCINOMA EXCISION     removed from face x 3  . CARDIAC CATHETERIZATION    . CARDIAC SURGERY  2000   stent in heart  . CORONARY ANGIOPLASTY  09/22/1998   s/p stent placement; [facility] 3.0 x 18 mm Ref [id number]  . CORONARY STENT INTERVENTION N/A 03/29/2017   Procedure: CORONARY STENT INTERVENTION;  Surgeon: Wellington Hampshire, MD;  Location: [facility];  Service: Cardiovascular;  Laterality: N/A;  . LEFT HEART CATH AND CORONARY ANGIOGRAPHY N/A 03/29/2017   Procedure: LEFT HEART CATH AND CORONARY ANGIOGRAPHY;  Surgeon: Wellington Hampshire, MD;  Location: [facility];  Service: Cardiovascular;  Laterality: N/A;  . 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 ULTRASOUND GUIDANCE FOR VASCULAR ACCESS  03/29/2017  Procedure: Ultrasound Guidance For Vascular Access;  Surgeon: Wellington Hampshire, MD;  Location: [facility];  Service: Cardiovascular;;   Family History  Problem Relation Age of Onset  . Cancer Mother        tumor on tonsil  . Stroke Maternal Grandfather   . Diabetes Neg Hx    Social History   Socioeconomic History  . Marital status: Married    Spouse name: Not on file  . Number of children: Not on file  . Years of education: Not on file  . Highest education level: Not on file  Occupational History  . Not on file  Social  Needs  . Financial resource strain: Not on file  . Food insecurity:    Worry: Not on file    Inability: Not on file  . Transportation needs:    Medical: Not on file    Non-medical: Not on file  Tobacco Use  . Smoking status: Former Smoker    Packs/day: 0.50    Years: 20.00    Pack years: 10.00    Types: Cigarettes    Last attempt to quit: 05/30/1978    Years since quitting: 39.5  . Smokeless tobacco: Never Used  Substance and Sexual Activity  . Alcohol use: No    Alcohol/week: 0.0 oz  . Drug use: No  . Sexual activity: Never  Lifestyle  . Physical activity:    Days per week: Not on file    Minutes per session: Not on file  . Stress: Not on file  Relationships  . Social connections:    Talks on phone: Not on file    Gets together: Not on file    Attends religious service: Not on file    Active member of club or organization: Not on file    Attends meetings of clubs or organizations: Not on file    Relationship status: Not on file  Other Topics Concern  . Not on file  Social History Narrative   Regular exercise: yes   Caffeine use: very little    Outpatient Encounter Medications as of 11/20/2017  Medication Sig  . aspirin EC 81 MG tablet Take 81 mg by mouth daily.  Marland Kitchen atorvastatin (LIPITOR) 20 MG tablet Take 20 mg by mouth daily.  . carvedilol (COREG) 6.25 MG tablet Take 1 tablet (6.25 mg total) by mouth 2 (two) times daily.  . cetirizine (ZYRTEC) 10 MG tablet Take 20 mg by mouth daily.  . clobetasol cream (TEMOVATE) 5.18 % Apply 1 application topically 2 (two) times daily. To affected areas  . clopidogrel (PLAVIX) 75 MG tablet Take 1 tablet (75 mg total) by mouth daily.  . cyclobenzaprine (FLEXERIL) 10 MG tablet Take 1 tablet (10 mg total) by mouth 3 (three) times daily as needed for muscle spasms.  . diphenhydrAMINE (BENADRYL) 25 MG tablet Take 25 mg by mouth every 6 (six) hours as needed.  . ezetimibe (ZETIA) 10 MG tablet Take 1 tablet (10 mg total) by mouth daily.    . fexofenadine (ALLEGRA) 180 MG tablet Take 180 mg by mouth daily.  Marland Kitchen glucose blood (ONE TOUCH ULTRA TEST) test strip Check blood sugar once daily and as directed Dx E11.319  . hydroxychloroquine (PLAQUENIL) 200 MG tablet Take 200 mg by mouth 2 (two) times daily.  . hydrOXYzine (ATARAX/VISTARIL) 25 MG tablet Take 25 mg by mouth 3 (three) times daily as needed.  Marland Kitchen ibuprofen (ADVIL,MOTRIN) 200 MG tablet Take 200-800 mg by mouth every 8 (eight) hours  as needed (for pain.).  Marland Kitchen LANCETS ULTRA THIN 30G MISC by Does not apply route. One Touch  . Multiple Vitamin (MULTIVITAMIN WITH MINERALS) TABS tablet Take 1 tablet by mouth daily.  . Multiple Vitamins-Minerals (AIRBORNE) TBEF Take 1 tablet by mouth daily as needed (for immune system support). Immune Health/Support  . nystatin cream (MYCOSTATIN) Apply 1 application topically 2 (two) times daily as needed for dry skin.  . predniSONE (DELTASONE) 10 MG tablet Take 1-2 tablets (10-20 mg total) by mouth daily as needed (for wegener's granulomatosis).  . repaglinide (PRANDIN) 2 MG tablet Take 1 tablet (2 mg total) by mouth 3 (three) times daily before meals.  . sacubitril-valsartan (ENTRESTO) 49-51 MG Take 1 tablet by mouth 2 (two) times daily.  . sitaGLIPtin (JANUVIA) 100 MG tablet Take 50 mg by mouth daily.  Marland Kitchen spironolactone (ALDACTONE) 25 MG tablet Take 1 tablet (25 mg total) by mouth daily.  Marland Kitchen tetrahydrozoline 0.05 % ophthalmic solution Place 1-2 drops into both eyes 3 (three) times daily as needed (for dry/irritated eyes).  . traMADol (ULTRAM) 50 MG tablet Take 1 tablet (50 mg total) by mouth every 6 (six) hours as needed for moderate pain.  . Triamcinolone Acetonide (NASACORT ALLERGY 24HR NA) Place 2 sprays into the nose at bedtime.   . [DISCONTINUED] nebivolol (BYSTOLIC) 5 MG tablet Take 1 tablet (5 mg total) by mouth 2 (two) times daily.   No facility-administered encounter medications on file as of 11/20/2017.     Activities of Daily Living In  your present state of health, do you have any difficulty performing the following activities: 11/20/2017 03/29/2017  Hearing? N N  Vision? N N  Difficulty concentrating or making decisions? N N  Walking or climbing stairs? Y N  Dressing or bathing? N N  Doing errands, shopping? N -  Preparing Food and eating ? N -  Using the Toilet? N -  In the past six months, have you accidently leaked urine? N -  Do you have problems with loss of bowel control? N -  Managing your Medications? N -  Managing your Finances? N -  Housekeeping or managing your Housekeeping? N -  Some recent data might be hidden    Patient Care Team: Tower, Wynelle Fanny, MD as PCP - General (Family Medicine) Rockey Situ, Kathlene November, MD as Consulting Physician (Cardiology) Oneta Rack, MD as Consulting Physician (Dermatology) Juanito Doom, MD as Consulting Physician (Pulmonary Disease)   Assessment:   This is a routine wellness examination for Kayron.   Hearing Screening   125Hz  250Hz  500Hz  1000Hz  2000Hz  3000Hz  4000Hz  6000Hz  8000Hz   Right ear:   40 40 40  0    Left ear:   0 0 40  0    Vision Screening Comments: July 2018 @ [facility]    Exercise Activities and Dietary recommendations Current Exercise Habits: Home exercise routine, Type of exercise: Other - see comments(stationary bike, hiking, camping), Time (Minutes): 20(10-30 minutes), Frequency (Times/Week): 7, Weekly Exercise (Minutes/Week): 140, Intensity: Mild, Exercise limited by: None identified  Goals    . Increase physical activity     Starting 11/20/2017, I will continue to exercise on stationary bike for at least 20-60 minutes daily, except when travelling.        Fall Risk Fall Risk  11/20/2017 11/15/2016 10/09/2015 06/09/2014 03/05/2013  Falls in the past year? No No No No No   Depression Screen PHQ 2/9 Scores 11/20/2017 11/15/2016 10/09/2015 06/09/2014  PHQ - 2 Score 0  0 0 0  PHQ- 9 Score 0 - - -    Cognitive Function MMSE - Mini Mental  State Exam 11/20/2017 11/15/2016 10/09/2015  Orientation to time 5 5 5   Orientation to Place 5 5 5   Registration 3 3 3   Attention/ Calculation 0 0 0  Recall 2 3 3   Recall-comments unable to recall 1 of 3 words - -  Language- name 2 objects 0 0 0  Language- repeat 1 1 1   Language- follow 3 step command 3 3 3   Language- read & follow direction 0 0 0  Write a sentence 0 0 0  Copy design 0 0 0  Total score 19 20 20        PLEASE NOTE: A Mini-Cog screen was completed. Maximum score is 20. A value of 0 denotes this part of [place] MMSE was not completed or the patient failed this part of the Mini-Cog screening.   Mini-Cog Screening Orientation to Time - Max 5 pts Orientation to Place - Max 5 pts Registration - Max 3 pts Recall - Max 3 pts Language Repeat - Max 1 pts Language Follow 3 Step Command - Max 3 pts   Immunization History  Administered Date(s) Administered  . Influenza Split 05/16/2011, 02/16/2012  . Influenza, High Dose Seasonal PF 02/28/2017  . Influenza,inj,Quad PF,6+ Mos 03/05/2013, 03/10/2014, 05/20/2015, 03/30/2016  . Pneumococcal Conjugate-13 06/09/2014  . Pneumococcal Polysaccharide-23 07/15/2011    Screening Tests Health Maintenance  Topic Date Due  . OPHTHALMOLOGY EXAM  12/23/2017  . INFLUENZA VACCINE  12/28/2017  . HEMOGLOBIN A1C  04/04/2018  . TETANUS/TDAP  05/30/2018  . FOOT EXAM  10/03/2018  . URINE MICROALBUMIN  11/21/2018  . COLONOSCOPY  07/29/2024  . PNA vac Low Risk Adult  Completed      Plan:     I have personally reviewed, addressed, and noted the following in the patient's chart:  A. Medical and social history B. Use of alcohol, tobacco or illicit drugs  C. Current medications and supplements D. Functional ability and status E.  Nutritional status F.  Physical activity G. Advance directives H. List of other physicians I.  Hospitalizations, surgeries, and ER visits in previous 12 months J.  [place] to include hearing,  vision, cognitive, depression L. Referrals and appointments - none  In addition, I have reviewed and discussed with patient certain preventive protocols, quality metrics, and best practice recommendations. A written personalized care plan for preventive services as well as general preventive health recommendations were provided to patient.  See attached scanned questionnaire for additional information.   Signed,   Lindell Noe, MHA, BS, LPN Health Coach   Lindell Noe, [place]  7/34/1935

## 2017-11-22 NOTE — Telephone Encounter (Signed)
During AWV on 11/20/2017, patient requested refill of Prednisone be sent to [facility], [place], [place].

## 2017-11-22 NOTE — Telephone Encounter (Signed)
Needs eval prior to refill.  Keep PCP appt next month.

## 2017-11-22 NOTE — Progress Notes (Signed)
PCP notes:   Health maintenance:  No gaps identified.   Abnormal screenings:   Mini-Cog score: 19/20 MMSE - Mini Mental State Exam 11/20/2017 11/15/2016 10/09/2015  Orientation to time 5 5 5   Orientation to Place 5 5 5   Registration 3 3 3   Attention/ Calculation 0 0 0  Recall 2 3 3   Recall-comments unable to recall 1 of 3 words - -  Language- name 2 objects 0 0 0  Language- repeat 1 1 1   Language- follow 3 step command 3 3 3   Language- read & follow direction 0 0 0  Write a sentence 0 0 0  Copy design 0 0 0  Total score 19 20 20     Hearing - failed  Hearing Screening   125Hz  250Hz  500Hz  1000Hz  2000Hz  3000Hz  4000Hz  6000Hz  8000Hz   Right ear:   40 40 40  0    Left ear:   0 0 40  0     Patient concerns:   None  Nurse concerns:  None  Next PCP appt:   12/04/17 @ 1430

## 2017-11-29 DIAGNOSIS — S41119A Laceration without foreign body of unspecified upper arm, initial encounter: Secondary | ICD-10-CM | POA: Diagnosis not present

## 2017-11-29 NOTE — Progress Notes (Signed)
I reviewed health advisor's note, was available for consultation, and agree with documentation and plan.  

## 2017-12-04 ENCOUNTER — Encounter: Payer: Self-pay | Admitting: Family Medicine

## 2017-12-04 ENCOUNTER — Ambulatory Visit (INDEPENDENT_AMBULATORY_CARE_PROVIDER_SITE_OTHER): Payer: Medicare HMO | Admitting: Family Medicine

## 2017-12-04 VITALS — BP 126/78 | HR 75 | Temp 97.6°F | Ht 69.5 in | Wt 234.0 lb

## 2017-12-04 DIAGNOSIS — E782 Mixed hyperlipidemia: Secondary | ICD-10-CM

## 2017-12-04 DIAGNOSIS — Z6835 Body mass index (BMI) 35.0-35.9, adult: Secondary | ICD-10-CM

## 2017-12-04 DIAGNOSIS — I10 Essential (primary) hypertension: Secondary | ICD-10-CM | POA: Diagnosis not present

## 2017-12-04 DIAGNOSIS — Z125 Encounter for screening for malignant neoplasm of prostate: Secondary | ICD-10-CM

## 2017-12-04 DIAGNOSIS — E11311 Type 2 diabetes mellitus with unspecified diabetic retinopathy with macular edema: Secondary | ICD-10-CM | POA: Diagnosis not present

## 2017-12-04 DIAGNOSIS — Z0000 Encounter for general adult medical examination without abnormal findings: Secondary | ICD-10-CM

## 2017-12-04 DIAGNOSIS — S41112A Laceration without foreign body of left upper arm, initial encounter: Secondary | ICD-10-CM | POA: Diagnosis not present

## 2017-12-04 DIAGNOSIS — S41119A Laceration without foreign body of unspecified upper arm, initial encounter: Secondary | ICD-10-CM | POA: Insufficient documentation

## 2017-12-04 DIAGNOSIS — Z7952 Long term (current) use of systemic steroids: Secondary | ICD-10-CM | POA: Diagnosis not present

## 2017-12-04 DIAGNOSIS — M3130 Wegener's granulomatosis without renal involvement: Secondary | ICD-10-CM

## 2017-12-04 MED ORDER — PREDNISONE 10 MG PO TABS: 10.0000 mg | ORAL_TABLET | Freq: Every day | ORAL | 0 refills | Status: DC | PRN

## 2017-12-04 NOTE — Patient Instructions (Addendum)
Use soap and water to clean your wound   If you are interested in the new shingles vaccine (Shingrix) - call your local pharmacy to check on coverage and availability  I would like you to get it - if affordable/ get on a wait list   It looks like you are due for a pulmonary appointment-please make sure you schedule that (let us know if you have a referral)   Keep working on healthy diet and exercise

## 2017-12-04 NOTE — Assessment & Plan Note (Signed)
Lab Results  Component Value Date   PSA 0.98 11/20/2017   PSA 0.79 11/10/2016   PSA 0.69 10/09/2015   No clinical changes or fam hx

## 2017-12-04 NOTE — Assessment & Plan Note (Signed)
bp in fair control at this time  BP Readings from Last 1 Encounters:  12/04/17 126/78   No changes needed Most recent labs reviewed  Disc lifstyle change with low sodium diet and exercise

## 2017-12-04 NOTE — Assessment & Plan Note (Signed)
Reviewed health habits including diet and exercise and skin cancer prevention Reviewed appropriate screening tests for age  Also reviewed health mt list, fam hx and immunization status , as well as social and family history   See HPI amw rev Labs rev  dexa rev  He will f/u with pulmonary for Wegener's  Improved DM  Disc shingrix vaccine

## 2017-12-04 NOTE — Assessment & Plan Note (Signed)
Overdue for pulmonary appt On plaquenil  On prednisone prn-refilled He will make his own f/u appt

## 2017-12-04 NOTE — Assessment & Plan Note (Signed)
Discussed how this problem influences overall health and the risks it imposes  °Reviewed plan for weight loss with lower calorie diet (via better food choices and also portion control or program like weight watchers) and exercise building up to or more than 30 minutes 5 days per week including some aerobic activity  ° °Commended wt loss so far  °

## 2017-12-04 NOTE — Progress Notes (Signed)
Subjective:    Patient ID: Ryan Morgan, male    DOB: 03-15-1941, 77 y.o.   MRN: [id number]  HPI Here for health maintenance exam and to review chronic medical problems    Doing well / feeling fairly good  Some camping/ grandchild time   Ryan Morgan had an accident  cut arm and had stitches while traveling- will need them removed in a week  Using bolt cutters- they slipped - happened wed  Took comp bandage off this am- tiny bit of oozing  Not hurting a lot  Keeping it clean with water  Had a tetanus shot     Wt Readings from Last 3 Encounters:  12/04/17 234 lb (106.1 kg)  11/20/17 229 lb (103.9 kg)  11/08/17 230 lb (104.3 kg)  was 245 a year ago  Working hard on diet-  Exercising also (walking, stationary bike)  34.06 kg/m   amw was 6/24 Mini cog 19/20- unable to remember one of 3 word recall  Ryan Morgan notices that Ryan Morgan has to focus on a word or name if Ryan Morgan wants to remember it  No confusion or getting lost   Eye exam due end of the mo - already scheduled   Colonoscopy 3/16 - small polyp    dexa 7/18 normal -hx of steroid tx  Still in the normal range  No fractures , no falls   Zoster status   Prostate health  Lab Results  Component Value Date   PSA 0.98 11/20/2017   PSA 0.79 11/10/2016   PSA 0.69 10/09/2015  no urination changes No family hx  Nocturia -0-1 No fam hx    H/o Wegener's granulomatosis Sees pulmonary once a year  Very small doses of prednisone 10 mg per day (once in a while takes 88) Overdue for f/u - will make that himself   CAD-had stents put in this year    bp is stable today  No cp or palpitations or headaches or edema  No side effects to medicines  BP Readings from Last 3 Encounters:  12/04/17 126/78  11/20/17 112/82  11/08/17 136/78     DM2  Lab Results  Component Value Date   HGBA1C 6.4 10/02/2017   This is much improved ! Sees Dr Loanne Drilling now  Changed med last time Also has drastically changed diet   Hyperlipidemia Lab Results    Component Value Date   CHOL 99 11/20/2017   CHOL 165 05/17/2017   CHOL 117 11/10/2016   Lab Results  Component Value Date   HDL 41.80 11/20/2017   HDL 36.40 (L) 05/17/2017   HDL 32.20 (L) 11/10/2016   Lab Results  Component Value Date   LDLCALC 38 11/20/2017   LDLCALC 46 11/10/2016   LDLCALC 68 06/02/2014   Lab Results  Component Value Date   TRIG 96.0 11/20/2017   TRIG 391.0 (H) 05/17/2017   TRIG 196.0 (H) 11/10/2016   Lab Results  Component Value Date   CHOLHDL 2 11/20/2017   CHOLHDL 5 05/17/2017   CHOLHDL 4 11/10/2016   Lab Results  Component Value Date   LDLDIRECT 100.0 05/17/2017   LDLDIRECT 69.0 10/09/2015   LDLDIRECT 65.7 08/13/2012  atorvastatin and diet  Good control  Trig are improved   Lab Results  Component Value Date   WBC 3.8 (L) 11/20/2017   HGB 14.6 11/20/2017   HCT 42.1 11/20/2017   MCV 90.8 11/20/2017   PLT 169.0 11/20/2017   Lab Results  Component Value Date   CREATININE 0.88  11/20/2017   BUN 11 11/20/2017   NA 140 11/20/2017   K 4.2 11/20/2017   CL 105 11/20/2017   CO2 27 11/20/2017   Lab Results  Component Value Date   ALT 14 11/20/2017   AST 14 11/20/2017   ALKPHOS 64 11/20/2017   BILITOT 0.5 11/20/2017    Lab Results  Component Value Date   TSH 0.91 11/20/2017      Patient Active Problem List   Diagnosis Date Noted  . Laceration of arm 12/04/2017  . Current chronic use of systemic steroids 11/15/2016  . Routine general medical examination at a health care facility 11/09/2015  . Hearing loss 11/09/2015  . History of colonic polyps 11/09/2015  . Rash of hands 03/10/2015  . Colon cancer screening 06/09/2014  . Blepharitis, bilateral 06/02/2014  . Shortness of breath 03/10/2014  . Benign paroxysmal positional vertigo 12/14/2013  . Bursitis of right hip 10/17/2013  . Coronary artery disease of native artery of native heart with stable angina pectoris ([place]) 03/19/2013  . Encounter for Medicare annual wellness exam  03/05/2013  . 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 Osteopenia 03/05/2013  . Obesity 07/15/2011  . Prostate cancer screening 07/10/2011  . Cough 12/23/2010  . Hypertension 08/30/2010  . Hyperlipemia 08/30/2010  . Diabetes mellitus type 2 with retinopathy ([place]) 08/30/2010  . Wegener's granulomatosis ([place]) 08/30/2010   Past Medical History:  Diagnosis Date  . Coronary artery disease    a. prior LAD stenting 2000; b. [place] 2003 LAD 40, D1 50; c. Lexiscan 02/2017: large defect of severe severity in the basal inferoseptal, basal inferior, mid inferoseptal, mid inferior and apical inferior location, high risk; d. LHC 03/29/17: LM nl, patent LAD stent w/ 40% ISR, D1 100,  OM1 90, RPDA 100 w/ L-R collats, post atrio 80% s/p PCI/DES    . Diabetes mellitus   . Hyperlipidemia   . Hypertension   . Ischemic cardiomyopathy    a. TTE 02/2017: EF 30-35%, diffuse HK, normal LV diastolic function parameters, mild AI/MR, RV systolic function normal, PASP normal  . Lung nodule    a. s/p prior thoracotomy  . Morbid obesity ([place])   . Wegener's granulomatosis ([place]) 2007   Past Surgical History:  Procedure Laterality Date  . BASAL CELL CARCINOMA EXCISION     removed from face x 3  . CARDIAC CATHETERIZATION    . CARDIAC SURGERY  2000   stent in heart  . CORONARY ANGIOPLASTY  09/22/1998   s/p stent placement; [facility] 3.0 x 18 mm Ref [id number]  . CORONARY STENT INTERVENTION N/A 03/29/2017   Procedure: CORONARY STENT INTERVENTION;  Surgeon: Wellington Hampshire, MD;  Location: [facility];  Service: Cardiovascular;  Laterality: N/A;  . LEFT HEART CATH AND CORONARY ANGIOGRAPHY N/A 03/29/2017   Procedure: LEFT HEART CATH AND CORONARY ANGIOGRAPHY;  Surgeon: Wellington Hampshire, MD;  Location: [facility];  Service: Cardiovascular;  Laterality: N/A;  . 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 ULTRASOUND GUIDANCE FOR VASCULAR ACCESS  03/29/2017    Procedure: Ultrasound Guidance For Vascular Access;  Surgeon: Wellington Hampshire, MD;  Location: [facility];  Service: Cardiovascular;;   Social History   Tobacco Use  . Smoking status: Former Smoker    Packs/day: 0.50    Years: 20.00    Pack years: 10.00    Types: Cigarettes    Last attempt to quit: 05/30/1978    Years  since quitting: 39.5  . Smokeless tobacco: Never Used  Substance Use Topics  . Alcohol use: No    Alcohol/week: 0.0 oz  . Drug use: No   Family History  Problem Relation Age of Onset  . Cancer Mother        tumor on tonsil  . Stroke Maternal Grandfather   . Diabetes Neg Hx    Allergies  Allergen Reactions  . Metformin And Related Itching  . Benicar Hct [Olmesartan Medoxomil-Hctz]     Painful and itchy knots on palms of hands  . Bystolic [Nebivolol Hcl]     headache  . Lovaza [Omega-3-Acid Ethyl Esters] Other (See Comments)    Unsure of reaction type  . Niaspan [Niacin Er] Other (See Comments)    flushing  . Septra [Bactrim] Hives   Current Outpatient Medications on File Prior to Visit  Medication Sig Dispense Refill  . aspirin EC 81 MG tablet Take 81 mg by mouth daily.    Marland Kitchen atorvastatin (LIPITOR) 20 MG tablet Take 20 mg by mouth daily.    . carvedilol (COREG) 6.25 MG tablet Take 1 tablet (6.25 mg total) by mouth 2 (two) times daily. 180 tablet 3  . cetirizine (ZYRTEC) 10 MG tablet Take 20 mg by mouth daily.    . clobetasol cream (TEMOVATE) 9.56 % Apply 1 application topically 2 (two) times daily. To affected areas 30 g 1  . clopidogrel (PLAVIX) 75 MG tablet Take 1 tablet (75 mg total) by mouth daily. 90 tablet 3  . diphenhydrAMINE (BENADRYL) 25 MG tablet Take 25 mg by mouth every 6 (six) hours as needed.    . ezetimibe (ZETIA) 10 MG tablet Take 1 tablet (10 mg total) by mouth daily. 90 tablet 3  . fexofenadine (ALLEGRA) 180 MG tablet Take 180 mg by mouth daily.    Marland Kitchen glucose blood (ONE TOUCH ULTRA TEST) test strip Check blood sugar once daily and  as directed Dx E11.319 100 each 1  . hydroxychloroquine (PLAQUENIL) 200 MG tablet Take 200 mg by mouth 2 (two) times daily.    . hydrOXYzine (ATARAX/VISTARIL) 25 MG tablet Take 25 mg by mouth 3 (three) times daily as needed.    Marland Kitchen ibuprofen (ADVIL,MOTRIN) 200 MG tablet Take 200-800 mg by mouth every 8 (eight) hours as needed (for pain.).    Marland Kitchen LANCETS ULTRA THIN 30G MISC by Does not apply route. One Touch    . Multiple Vitamin (MULTIVITAMIN WITH MINERALS) TABS tablet Take 1 tablet by mouth daily.    . Multiple Vitamins-Minerals (AIRBORNE) TBEF Take 1 tablet by mouth daily as needed (for immune system support). Immune Health/Support    . nystatin cream (MYCOSTATIN) Apply 1 application topically 2 (two) times daily as needed for dry skin.    . repaglinide (PRANDIN) 2 MG tablet Take 1 tablet (2 mg total) by mouth 3 (three) times daily before meals. 270 tablet 3  . sacubitril-valsartan (ENTRESTO) 49-51 MG Take 1 tablet by mouth 2 (two) times daily. 60 tablet 11  . sitaGLIPtin (JANUVIA) 100 MG tablet Take 50 mg by mouth daily.    Marland Kitchen spironolactone (ALDACTONE) 25 MG tablet Take 1 tablet (25 mg total) by mouth daily. 90 tablet 3  . tetrahydrozoline 0.05 % ophthalmic solution Place 1-2 drops into both eyes 3 (three) times daily as needed (for dry/irritated eyes).    . traMADol (ULTRAM) 50 MG tablet Take 1 tablet (50 mg total) by mouth every 6 (six) hours as needed for moderate pain. 30 tablet 0  .  Triamcinolone Acetonide (NASACORT ALLERGY 24HR NA) Place 2 sprays into the nose at bedtime.     . [DISCONTINUED] nebivolol (BYSTOLIC) 5 MG tablet Take 1 tablet (5 mg total) by mouth 2 (two) times daily. 180 tablet 3   No current facility-administered medications on file prior to visit.     Review of Systems  Constitutional: Negative for activity change, appetite change, fatigue, fever and unexpected weight change.  HENT: Negative for congestion, rhinorrhea, sore throat and trouble swallowing.   Eyes: Negative  for pain, redness, itching and visual disturbance.  Respiratory: Negative for cough, chest tightness, shortness of breath and wheezing.        Not sob lately   Cardiovascular: Negative for chest pain and palpitations.  Gastrointestinal: Negative for abdominal pain, blood in stool, constipation, diarrhea and nausea.  Endocrine: Negative for cold intolerance, heat intolerance, polydipsia and polyuria.  Genitourinary: Negative for difficulty urinating, dysuria, frequency and urgency.  Musculoskeletal: Negative for arthralgias, joint swelling and myalgias.  Skin: Negative for pallor and rash.       Wound L arm  Neurological: Negative for dizziness, tremors, weakness, numbness and headaches.  Hematological: Negative for adenopathy. Does not bruise/bleed easily.  Psychiatric/Behavioral: Negative for decreased concentration and dysphoric mood. The patient is not nervous/anxious.        Objective:   Physical Exam  Constitutional: Ryan Morgan appears well-developed and well-nourished. No distress.  obese and well appearing   HENT:  Head: Normocephalic and atraumatic.  Right Ear: External ear normal.  Left Ear: External ear normal.  Nose: Nose normal.  Mouth/Throat: Oropharynx is clear and moist.  Eyes: Pupils are equal, round, and reactive to light. Conjunctivae and EOM are normal. Right eye exhibits no discharge. Left eye exhibits no discharge. No scleral icterus.  Neck: Normal range of motion. Neck supple. No JVD present. Carotid bruit is not present. No thyromegaly present.  Cardiovascular: Normal rate, regular rhythm, normal heart sounds and intact distal pulses. Exam reveals no gallop.  Pulmonary/Chest: Effort normal and breath sounds normal. No respiratory distress. Ryan Morgan has no wheezes. Ryan Morgan exhibits no tenderness.  Abdominal: Soft. Bowel sounds are normal. Ryan Morgan exhibits no distension, no abdominal bruit and no mass. There is no tenderness.  Musculoskeletal: Ryan Morgan exhibits no edema or tenderness.    Lymphadenopathy:    Ryan Morgan has no cervical adenopathy.  Neurological: Ryan Morgan is alert. Ryan Morgan has normal reflexes. No cranial nerve deficit. Ryan Morgan exhibits normal muscle tone. Coordination normal.  Skin: Skin is warm and dry. No rash noted. No erythema. No pallor.  Crescent shaped wound L upper /inner arm with sutures Some ecchymosis No swelling/oozing  Solar lentigines diffusely  Solar aging Diffuse SKs  Psychiatric: Ryan Morgan has a normal mood and affect.  Pleasant- good mood           Assessment & Plan:   Problem List Items Addressed This Visit      Cardiovascular and Mediastinum   Hypertension    bp in fair control at this time  BP Readings from Last 1 Encounters:  12/04/17 126/78   No changes needed Most recent labs reviewed  Disc lifstyle change with low sodium diet and exercise        Wegener's granulomatosis ([place])    Overdue for pulmonary appt On plaquenil  On prednisone prn-refilled Ryan Morgan will make his own f/u appt         Endocrine   Diabetes mellitus type 2 with retinopathy [facility])    Lab Results  Component Value Date   HGBA1C  6.4 10/02/2017   Much improved Seeing endocrinology -Dr Loanne Drilling        Other   Current chronic use of systemic steroids    Prn for Wegener's  Due for pulm f/u  dexa was normal  Rev labs      Hyperlipemia    Very well controlled with atorvastatin and diet  Improved trig Disc goals for lipids and reasons to control them Rev last labs with pt Rev low sat fat diet in detail       Laceration of arm   Obesity    Discussed how this problem influences overall health and the risks it imposes  Reviewed plan for weight loss with lower calorie diet (via better food choices and also portion control or program like weight watchers) and exercise building up to or more than 30 minutes 5 days per week including some aerobic activity   Commended wt loss so far      Prostate cancer screening    Lab Results  Component Value Date   PSA 0.98  11/20/2017   PSA 0.79 11/10/2016   PSA 0.69 10/09/2015   No clinical changes or fam hx      Routine general medical examination at a health care facility - Primary    Reviewed health habits including diet and exercise and skin cancer prevention Reviewed appropriate screening tests for age  Also reviewed health mt list, fam hx and immunization status , as well as social and family history   See HPI amw rev Labs rev  dexa rev  Ryan Morgan will f/u with pulmonary for Wegener's  Improved DM  Disc shingrix vaccine

## 2017-12-04 NOTE — Assessment & Plan Note (Signed)
Very well controlled with atorvastatin and diet  Improved trig Disc goals for lipids and reasons to control them Rev last labs with pt Rev low sat fat diet in detail

## 2017-12-04 NOTE — Assessment & Plan Note (Signed)
Prn for Wegener's  Due for pulm f/u  dexa was normal  Rev labs

## 2017-12-04 NOTE — Assessment & Plan Note (Signed)
Lab Results  Component Value Date   HGBA1C 6.4 10/02/2017   Much improved Seeing endocrinology -Dr Loanne Drilling

## 2017-12-11 ENCOUNTER — Encounter: Payer: Self-pay | Admitting: Family Medicine

## 2017-12-11 ENCOUNTER — Ambulatory Visit (INDEPENDENT_AMBULATORY_CARE_PROVIDER_SITE_OTHER): Payer: Medicare HMO | Admitting: Family Medicine

## 2017-12-11 VITALS — BP 110/70 | HR 68 | Temp 97.4°F | Ht 69.5 in | Wt 230.8 lb

## 2017-12-11 DIAGNOSIS — S41112A Laceration without foreign body of left upper arm, initial encounter: Secondary | ICD-10-CM | POA: Diagnosis not present

## 2017-12-11 MED ORDER — MUPIROCIN 2 % EX OINT: 1.0000 "application " | TOPICAL_OINTMENT | Freq: Two times a day (BID) | CUTANEOUS | 0 refills | Status: DC

## 2017-12-11 NOTE — Assessment & Plan Note (Signed)
5 simple interrupted sutures removed without problems  Mild erythema surrounding crescent shaped skin wound (flap repair)  Px for bactroban given  Soap and water cleanse  Alert if worse redness or any discomfort or drainage  Update if not starting to improve in a week or if worsening    Meds ordered this encounter  Medications  . mupirocin ointment (BACTROBAN) 2 %    Sig: Apply 1 application topically 2 (two) times daily. Apply to wound twice daily until healed    Dispense:  15 g    Refill:  0

## 2017-12-11 NOTE — Patient Instructions (Signed)
Wound looks good Protect it from dirt - loose covering Use the generic bactroban ointment until redness goes away If redness increases or any pain let me know  Clean with soap and water- do not submerge for another week

## 2017-12-11 NOTE — Progress Notes (Signed)
Subjective:    Patient ID: Ryan Morgan, male    DOB: 1941-02-17, 77 y.o.   MRN: [id number]  HPI Here for laceration care/ remove sutures   Had accident while traveling and cut his arm  Bolt cutters slipped  Had a tetanus shot   No pain  No redness  occ oozes a bit -on plavix    Wt Readings from Last 3 Encounters:  12/11/17 230 lb 12 oz (104.7 kg)  12/04/17 234 lb (106.1 kg)  11/20/17 229 lb (103.9 kg)   33.59 kg/m   Patient Active Problem List   Diagnosis Date Noted  . Laceration of arm 12/04/2017  . Current chronic use of systemic steroids 11/15/2016  . Routine general medical examination at a health care facility 11/09/2015  . Hearing loss 11/09/2015  . History of colonic polyps 11/09/2015  . Rash of hands 03/10/2015  . Colon cancer screening 06/09/2014  . Blepharitis, bilateral 06/02/2014  . Shortness of breath 03/10/2014  . Benign paroxysmal positional vertigo 12/14/2013  . Bursitis of right hip 10/17/2013  . Coronary artery disease of native artery of native heart with stable angina pectoris ([place]) 03/19/2013  . Encounter for Medicare annual wellness exam 03/05/2013  . 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 Osteopenia 03/05/2013  . Obesity 07/15/2011  . Prostate cancer screening 07/10/2011  . Cough 12/23/2010  . Hypertension 08/30/2010  . Hyperlipemia 08/30/2010  . Diabetes mellitus type 2 with retinopathy ([place]) 08/30/2010  . Wegener's granulomatosis ([place]) 08/30/2010   Past Medical History:  Diagnosis Date  . Coronary artery disease    a. prior LAD stenting 2000; b. [place] 2003 LAD 40, D1 50; c. Lexiscan 02/2017: large defect of severe severity in the basal inferoseptal, basal inferior, mid inferoseptal, mid inferior and apical inferior location, high risk; d. LHC 03/29/17: LM nl, patent LAD stent w/ 40% ISR, D1 100,  OM1 90, RPDA 100 w/ L-R collats, post atrio 80% s/p PCI/DES    . Diabetes mellitus   . Hyperlipidemia   .  Hypertension   . Ischemic cardiomyopathy    a. TTE 02/2017: EF 30-35%, diffuse HK, normal LV diastolic function parameters, mild AI/MR, RV systolic function normal, PASP normal  . Lung nodule    a. s/p prior thoracotomy  . Morbid obesity ([place])   . Wegener's granulomatosis ([place]) 2007   Past Surgical History:  Procedure Laterality Date  . BASAL CELL CARCINOMA EXCISION     removed from face x 3  . CARDIAC CATHETERIZATION    . CARDIAC SURGERY  2000   stent in heart  . CORONARY ANGIOPLASTY  09/22/1998   s/p stent placement; Tristar 3.0 x 18 mm Ref [id number]  . CORONARY STENT INTERVENTION N/A 03/29/2017   Procedure: CORONARY STENT INTERVENTION;  Surgeon: Wellington Hampshire, MD;  Location: [facility];  Service: Cardiovascular;  Laterality: N/A;  . LEFT HEART CATH AND CORONARY ANGIOGRAPHY N/A 03/29/2017   Procedure: LEFT HEART CATH AND CORONARY ANGIOGRAPHY;  Surgeon: Wellington Hampshire, MD;  Location: [facility];  Service: Cardiovascular;  Laterality: N/A;  . 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 ULTRASOUND GUIDANCE FOR VASCULAR ACCESS  03/29/2017   Procedure: Ultrasound Guidance For Vascular Access;  Surgeon: Wellington Hampshire, MD;  Location: [facility];  Service: Cardiovascular;;   Social History   Tobacco Use  . Smoking status: Former Smoker    Packs/day: 0.50    Years:  20.00    Pack years: 10.00    Types: Cigarettes    Last attempt to quit: 05/30/1978    Years since quitting: 39.5  . Smokeless tobacco: Never Used  Substance Use Topics  . Alcohol use: No    Alcohol/week: 0.0 oz  . Drug use: No   Family History  Problem Relation Age of Onset  . Cancer Mother        tumor on tonsil  . Stroke Maternal Grandfather   . Diabetes Neg Hx    Allergies  Allergen Reactions  . Metformin And Related Itching  . Benicar Hct [Olmesartan Medoxomil-Hctz]     Painful and itchy knots on palms of hands  . Bystolic [Nebivolol Hcl]     headache    . Lovaza [Omega-3-Acid Ethyl Esters] Other (See Comments)    Unsure of reaction type  . Niaspan [Niacin Er] Other (See Comments)    flushing  . Septra [Bactrim] Hives   Current Outpatient Medications on File Prior to Visit  Medication Sig Dispense Refill  . aspirin EC 81 MG tablet Take 81 mg by mouth daily.    Marland Kitchen atorvastatin (LIPITOR) 20 MG tablet Take 20 mg by mouth daily.    . carvedilol (COREG) 6.25 MG tablet Take 1 tablet (6.25 mg total) by mouth 2 (two) times daily. 180 tablet 3  . cetirizine (ZYRTEC) 10 MG tablet Take 20 mg by mouth daily.    . clobetasol cream (TEMOVATE) 4.85 % Apply 1 application topically 2 (two) times daily. To affected areas 30 g 1  . clopidogrel (PLAVIX) 75 MG tablet Take 1 tablet (75 mg total) by mouth daily. 90 tablet 3  . diphenhydrAMINE (BENADRYL) 25 MG tablet Take 25 mg by mouth every 6 (six) hours as needed.    . ezetimibe (ZETIA) 10 MG tablet Take 1 tablet (10 mg total) by mouth daily. 90 tablet 3  . fexofenadine (ALLEGRA) 180 MG tablet Take 180 mg by mouth daily.    Marland Kitchen glucose blood (ONE TOUCH ULTRA TEST) test strip Check blood sugar once daily and as directed Dx E11.319 100 each 1  . hydroxychloroquine (PLAQUENIL) 200 MG tablet Take 200 mg by mouth 2 (two) times daily.    Marland Kitchen ibuprofen (ADVIL,MOTRIN) 200 MG tablet Take 200-800 mg by mouth every 8 (eight) hours as needed (for pain.).    Marland Kitchen LANCETS ULTRA THIN 30G MISC by Does not apply route. One Touch    . Multiple Vitamin (MULTIVITAMIN WITH MINERALS) TABS tablet Take 1 tablet by mouth daily.    . Multiple Vitamins-Minerals (AIRBORNE) TBEF Take 1 tablet by mouth daily as needed (for immune system support). Immune Health/Support    . nystatin cream (MYCOSTATIN) Apply 1 application topically 2 (two) times daily as needed for dry skin.    . predniSONE (DELTASONE) 10 MG tablet Take 1-2 tablets (10-20 mg total) by mouth daily as needed (for wegener's granulomatosis). 30 tablet 0  . repaglinide (PRANDIN) 2 MG  tablet Take 1 tablet (2 mg total) by mouth 3 (three) times daily before meals. 270 tablet 3  . sacubitril-valsartan (ENTRESTO) 49-51 MG Take 1 tablet by mouth 2 (two) times daily. 60 tablet 11  . sitaGLIPtin (JANUVIA) 100 MG tablet Take 50 mg by mouth daily.    Marland Kitchen spironolactone (ALDACTONE) 25 MG tablet Take 1 tablet (25 mg total) by mouth daily. 90 tablet 3  . tetrahydrozoline 0.05 % ophthalmic solution Place 1-2 drops into both eyes 3 (three) times daily as needed (for dry/irritated  eyes).    . Triamcinolone Acetonide (NASACORT ALLERGY 24HR NA) Place 2 sprays into the nose at bedtime.     . [DISCONTINUED] nebivolol (BYSTOLIC) 5 MG tablet Take 1 tablet (5 mg total) by mouth 2 (two) times daily. 180 tablet 3   No current facility-administered medications on file prior to visit.     Review of Systems  Constitutional: Negative for activity change, appetite change, fatigue, fever and unexpected weight change.  Respiratory: Negative for cough, chest tightness, shortness of breath and wheezing.   Cardiovascular: Negative for chest pain and palpitations.  Gastrointestinal: Negative for abdominal pain and blood in stool.  Endocrine: Negative for cold intolerance, heat intolerance, polydipsia and polyuria.  Genitourinary: Negative for dysuria and frequency.  Musculoskeletal: Negative for arthralgias, joint swelling and myalgias.  Skin: Positive for wound. Negative for pallor and rash.  Neurological: Negative for dizziness, tremors, weakness, numbness and headaches.  Hematological: Negative for adenopathy. Bruises/bleeds easily.       Due to plavix   Psychiatric/Behavioral: Negative for decreased concentration and dysphoric mood. The patient is not nervous/anxious.        Objective:   Physical Exam  HENT:  Head: Normocephalic and atraumatic.  Eyes: Pupils are equal, round, and reactive to light. Conjunctivae and EOM are normal.  Neck: Normal range of motion. Neck supple.  Musculoskeletal: He  exhibits no edema, tenderness or deformity.  Lymphadenopathy:    He has no cervical adenopathy.  Skin: Skin is warm and dry. No rash noted. No pallor.  Crescent shaped wound with mild erythema on L upper arm  5 simple interrupted sutures removed  Scant scabbing  Wound edges well approximated  No tenderness or bleeding   Psychiatric: He has a normal mood and affect.          Assessment & Plan:   Problem List Items Addressed This Visit      Other   Laceration of arm - Primary    5 simple interrupted sutures removed without problems  Mild erythema surrounding crescent shaped skin wound (flap repair)  Px for bactroban given  Soap and water cleanse  Alert if worse redness or any discomfort or drainage  Update if not starting to improve in a week or if worsening    Meds ordered this encounter  Medications  . mupirocin ointment (BACTROBAN) 2 %    Sig: Apply 1 application topically 2 (two) times daily. Apply to wound twice daily until healed    Dispense:  15 g    Refill:  0

## 2017-12-29 DIAGNOSIS — E113393 Type 2 diabetes mellitus with moderate nonproliferative diabetic retinopathy without macular edema, bilateral: Secondary | ICD-10-CM | POA: Diagnosis not present

## 2017-12-29 LAB — HM DIABETES EYE EXAM

## 2018-01-01 ENCOUNTER — Other Ambulatory Visit: Payer: Self-pay | Admitting: Cardiovascular Disease

## 2018-01-04 ENCOUNTER — Telehealth: Payer: Self-pay | Admitting: Cardiovascular Disease

## 2018-01-04 NOTE — Telephone Encounter (Signed)
S/w patient. He said he still has 2 weeks of Entresto left from his 30-day free card. Advised patient to call [facility] (number provided) and inquire of Patient assistance. Told him I would go ahead and print application and give to Dr Rockey Situ to sign when back in the office. Patient is leaving to go out of town today and will come by Monday or Tuesday to pick up his portion of the application. Dr Donivan Scull portion placed in his cubby to sign.

## 2018-01-04 NOTE — Telephone Encounter (Signed)
Patient wife calling States the patient is having a hard time affording the [facility] medication Patient has sent a mychart message and would prefer to speak with Dr. Rockey Situ on Monday Please advise

## 2018-01-08 ENCOUNTER — Encounter: Payer: Self-pay | Admitting: Endocrinology

## 2018-01-08 ENCOUNTER — Ambulatory Visit: Payer: Medicare HMO | Admitting: Endocrinology

## 2018-01-08 VITALS — BP 125/72 | HR 86 | Ht 70.0 in | Wt 235.4 lb

## 2018-01-08 DIAGNOSIS — E11319 Type 2 diabetes mellitus with unspecified diabetic retinopathy without macular edema: Secondary | ICD-10-CM

## 2018-01-08 LAB — POCT GLYCOSYLATED HEMOGLOBIN (HGB A1C): Hemoglobin A1C: 6.8 % — AB (ref 4.0–5.6)

## 2018-01-08 NOTE — Patient Instructions (Addendum)
Please continue the same repaglinide check your blood sugar once a day.  vary the time of day when you check, between before the 3 meals, and at bedtime.  also check if you have symptoms of your blood sugar being too high or too low.  please keep a record of the readings and bring it to your next appointment here (or you can bring the meter itself).  You can write it on any piece of paper.  please call us sooner if your blood sugar goes below 70, or if you have a lot of readings over 200. Please come back for a follow-up appointment in 4 months.

## 2018-01-08 NOTE — Progress Notes (Signed)
Subjective:    Patient ID: Ryan Morgan, male    DOB: Oct 06, 1940, 77 y.o.   MRN: [id number]  HPI Pt returns for f/u of diabetes mellitus: DM type: 2 Dx'ed: 0932 Complications: polyneuropathy, DR, and CAD.   Therapy: repaglinide DKA: never Severe hypoglycemia: never.   Pancreatitis: never Pancreatic imaging: never Other: he takes intermittent prednisone for Wegener's granulomatosis; he did not tolerate metformin (rash); he cannot afford brand name meds.   Interval history: no recent steroids.  Meter is downloaded today, and the printout is scanned into the record.  It varies from 100-240.  All are checked fasting.  pt states he feels well in general.  Pt says he take repaglinide as rx'ed Past Medical History:  Diagnosis Date  . Coronary artery disease    a. prior LAD stenting 2000; b. [place] 2003 LAD 40, D1 50; c. Lexiscan 02/2017: large defect of severe severity in the basal inferoseptal, basal inferior, mid inferoseptal, mid inferior and apical inferior location, high risk; d. LHC 03/29/17: LM nl, patent LAD stent w/ 40% ISR, D1 100,  OM1 90, RPDA 100 w/ L-R collats, post atrio 80% s/p PCI/DES    . Diabetes mellitus   . Hyperlipidemia   . Hypertension   . Ischemic cardiomyopathy    a. TTE 02/2017: EF 30-35%, diffuse HK, normal LV diastolic function parameters, mild AI/MR, RV systolic function normal, PASP normal  . Lung nodule    a. s/p prior thoracotomy  . Morbid obesity ([place])   . Wegener's granulomatosis ([place]) 2007    Past Surgical History:  Procedure Laterality Date  . BASAL CELL CARCINOMA EXCISION     removed from face x 3  . CARDIAC CATHETERIZATION    . CARDIAC SURGERY  2000   stent in heart  . CORONARY ANGIOPLASTY  09/22/1998   s/p stent placement; Tristar 3.0 x 18 mm Ref [id number]  . CORONARY STENT INTERVENTION N/A 03/29/2017   Procedure: CORONARY STENT INTERVENTION;  Surgeon: Wellington Hampshire, MD;  Location: [facility];  Service: Cardiovascular;  Laterality:  N/A;  . LEFT HEART CATH AND CORONARY ANGIOGRAPHY N/A 03/29/2017   Procedure: LEFT HEART CATH AND CORONARY ANGIOGRAPHY;  Surgeon: Wellington Hampshire, MD;  Location: [facility];  Service: Cardiovascular;  Laterality: N/A;  . 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 ULTRASOUND GUIDANCE FOR VASCULAR ACCESS  03/29/2017   Procedure: Ultrasound Guidance For Vascular Access;  Surgeon: Wellington Hampshire, MD;  Location: [facility];  Service: Cardiovascular;;    Social History   Socioeconomic History  . Marital status: Married    Spouse name: Not on file  . Number of children: Not on file  . Years of education: Not on file  . Highest education level: Not on file  Occupational History  . Not on file  Social Needs  . Financial resource strain: Not on file  . Food insecurity:    Worry: Not on file    Inability: Not on file  . Transportation needs:    Medical: Not on file    Non-medical: Not on file  Tobacco Use  . Smoking status: Former Smoker    Packs/day: 0.50    Years: 20.00    Pack years: 10.00    Types: Cigarettes    Last attempt to quit: 05/30/1978    Years since quitting: 39.6  . Smokeless tobacco: Never Used  Substance and Sexual Activity  . Alcohol use: No  Alcohol/week: 0.0 standard drinks  . Drug use: No  . Sexual activity: Never  Lifestyle  . Physical activity:    Days per week: Not on file    Minutes per session: Not on file  . Stress: Not on file  Relationships  . Social connections:    Talks on phone: Not on file    Gets together: Not on file    Attends religious service: Not on file    Active member of club or organization: Not on file    Attends meetings of clubs or organizations: Not on file    Relationship status: Not on file  . Intimate partner violence:    Fear of current or ex partner: Not on file    Emotionally abused: Not on file    Physically abused: Not on file    Forced sexual activity: Not on file  Other  Topics Concern  . Not on file  Social History Narrative   Regular exercise: yes   Caffeine use: very little    Current Outpatient Medications on File Prior to Visit  Medication Sig Dispense Refill  . aspirin EC 81 MG tablet Take 81 mg by mouth daily.    Marland Kitchen atorvastatin (LIPITOR) 20 MG tablet Take 20 mg by mouth daily.    . carvedilol (COREG) 6.25 MG tablet Take 1 tablet (6.25 mg total) by mouth 2 (two) times daily. 180 tablet 3  . cetirizine (ZYRTEC) 10 MG tablet Take 20 mg by mouth daily.    . clobetasol cream (TEMOVATE) 5.57 % Apply 1 application topically 2 (two) times daily. To affected areas 30 g 1  . clopidogrel (PLAVIX) 75 MG tablet Take 1 tablet (75 mg total) by mouth daily. 90 tablet 3  . diphenhydrAMINE (BENADRYL) 25 MG tablet Take 25 mg by mouth every 6 (six) hours as needed.    Marland Kitchen ENTRESTO 49-51 MG TAKE 1 TABLET BY MOUTH TWICE DAILY 60 tablet 3  . ezetimibe (ZETIA) 10 MG tablet Take 1 tablet (10 mg total) by mouth daily. 90 tablet 3  . fexofenadine (ALLEGRA) 180 MG tablet Take 180 mg by mouth daily.    Marland Kitchen glucose blood (ONE TOUCH ULTRA TEST) test strip Check blood sugar once daily and as directed Dx E11.319 100 each 1  . hydroxychloroquine (PLAQUENIL) 200 MG tablet Take 200 mg by mouth 2 (two) times daily.    Marland Kitchen ibuprofen (ADVIL,MOTRIN) 200 MG tablet Take 200-800 mg by mouth every 8 (eight) hours as needed (for pain.).    Marland Kitchen LANCETS ULTRA THIN 30G MISC by Does not apply route. One Touch    . Multiple Vitamin (MULTIVITAMIN WITH MINERALS) TABS tablet Take 1 tablet by mouth daily.    . Multiple Vitamins-Minerals (AIRBORNE) TBEF Take 1 tablet by mouth daily as needed (for immune system support). Immune Health/Support    . mupirocin ointment (BACTROBAN) 2 % Apply 1 application topically 2 (two) times daily. Apply to wound twice daily until healed 15 g 0  . nystatin cream (MYCOSTATIN) Apply 1 application topically 2 (two) times daily as needed for dry skin.    . predniSONE (DELTASONE) 10  MG tablet Take 1-2 tablets (10-20 mg total) by mouth daily as needed (for wegener's granulomatosis). 30 tablet 0  . repaglinide (PRANDIN) 2 MG tablet Take 1 tablet (2 mg total) by mouth 3 (three) times daily before meals. 270 tablet 3  . tetrahydrozoline 0.05 % ophthalmic solution Place 1-2 drops into both eyes 3 (three) times daily as needed (for dry/irritated  eyes).    . Triamcinolone Acetonide (NASACORT ALLERGY 24HR NA) Place 2 sprays into the nose at bedtime.     Marland Kitchen spironolactone (ALDACTONE) 25 MG tablet Take 1 tablet (25 mg total) by mouth daily. 90 tablet 3  . [DISCONTINUED] nebivolol (BYSTOLIC) 5 MG tablet Take 1 tablet (5 mg total) by mouth 2 (two) times daily. 180 tablet 3   No current facility-administered medications on file prior to visit.     Allergies  Allergen Reactions  . Metformin And Related Itching  . Benicar Hct [Olmesartan Medoxomil-Hctz]     Painful and itchy knots on palms of hands  . Bystolic [Nebivolol Hcl]     headache  . Lovaza [Omega-3-Acid Ethyl Esters] Other (See Comments)    Unsure of reaction type  . Niaspan [Niacin Er] Other (See Comments)    flushing  . Septra [Bactrim] Hives    Family History  Problem Relation Age of Onset  . Cancer Mother        tumor on tonsil  . Stroke Maternal Grandfather   . Diabetes Neg Hx     BP 125/72 (BP Location: Left Arm, Patient Position: Sitting, Cuff Size: Normal)   Pulse 86   Ht 5\' 10"  (1.778 m)   Wt 235 lb 6.4 oz (106.8 kg)   SpO2 97%   BMI 33.78 kg/m    Review of Systems He denies hypoglycemia    Objective:   Physical Exam VITAL SIGNS:  See vs page GENERAL: no distress Pulses: dorsalis pedis intact bilat.   MSK: no deformity of the feet CV: trace bilat leg edema Skin:  no ulcer on the feet, but the skin is dry and scaly.  normal color and temp on the feet.  Neuro: sensation is intact to touch on the feet, but decreased from normal.  Ext: There is bilateral onychomycosis of the toenails.    Lab  Results  Component Value Date   HGBA1C 6.8 (A) 01/08/2018   Lab Results  Component Value Date   CREATININE 0.88 11/20/2017   BUN 11 11/20/2017   NA 140 11/20/2017   K 4.2 11/20/2017   CL 105 11/20/2017   CO2 27 11/20/2017       Assessment & Plan:  Type 2 DM, with DR: well-controlled Wegener's granulomatosis: he is asked to call if he resumes steroid rx.    Patient Instructions  Please continue the same repaglinide check your blood sugar once a day.  vary the time of day when you check, between before the 3 meals, and at bedtime.  also check if you have symptoms of your blood sugar being too high or too low.  please keep a record of the readings and bring it to your next appointment here (or you can bring the meter itself).  You can write it on any piece of paper.  please call us sooner if your blood sugar goes below 70, or if you have a lot of readings over 200. Please come back for a follow-up appointment in 4 months.

## 2018-01-10 NOTE — Telephone Encounter (Signed)
Patient wife calling Would like to know if we have received any information from [facility] Please call to discuss

## 2018-01-11 ENCOUNTER — Encounter: Payer: Self-pay | Admitting: Family Medicine

## 2018-01-11 NOTE — Telephone Encounter (Signed)
Spoke with patient and he states that they submitted income information. Advised that I would have provider sign forms today and would be in touch with him once that has been done. Reviewed that once provider signs I can fax completed form to assistance program if they have filled out their portion and sent in. He verbalized  Understanding and states that they are waiting on something in the mail to finish that part up. Will touch bases with him once additional information has been done.

## 2018-01-15 NOTE — Telephone Encounter (Signed)
Forms signed by provider and placed in "Prior Authorizations" space above Shakthi Scipio's desk.

## 2018-01-18 DIAGNOSIS — R69 Illness, unspecified: Secondary | ICD-10-CM | POA: Diagnosis not present

## 2018-01-18 NOTE — Telephone Encounter (Signed)
Spoke with patient and let him know that paperwork has been completed by physician and now I just need his signature with copy of his insurance cards. He stated that he would be right over to complete and then I can fax off for him. He was appreciative for the call with no further questions.

## 2018-01-18 NOTE — Telephone Encounter (Signed)
Forms completed for this patient and faxed to assistance program. Placed in "Faxed" bin above desk.

## 2018-01-24 ENCOUNTER — Telehealth: Payer: Self-pay | Admitting: Cardiovascular Disease

## 2018-01-24 NOTE — Telephone Encounter (Signed)
Thayer Headings calling from [employer] asking for a call back stating she received the patient assistance forms but needs a call with the ICD 10 DX code   Please call back

## 2018-01-24 NOTE — Telephone Encounter (Signed)
Spoke with assistance program and they just needed diagnosis codes for medication. They also inquired if prior authorization has been done and advised that we have not received a request for that at this time. He verbalized understanding with no further questions at this time.

## 2018-01-24 NOTE — Telephone Encounter (Signed)
Spoke with patient and reviewed that application for assistance was sent in and they should be processing it. Advised that I do have one box of samples that I can leave up front to pick up. He verbalized understanding with no further questions at this time.  Medication Samples have been provided to the patient.  Drug name: Delene Loll       Strength: 49/51        Qty: 1 box  LOT: [id number]  Exp.Date: 1/21

## 2018-01-24 NOTE — Telephone Encounter (Signed)
Patient wife calling asking for an update on Entresto  She states they will be going out of town for a week and would like a call back  Please advise

## 2018-01-31 ENCOUNTER — Telehealth: Payer: Self-pay | Admitting: Cardiovascular Disease

## 2018-01-31 NOTE — Telephone Encounter (Signed)
Patient returning call.

## 2018-01-31 NOTE — Telephone Encounter (Signed)
Patient calling for an asap appointment and says Dr. Rockey Situ requested this.  Scheduled 9/16 but this is sooner than recall. Is the visit reason known ?

## 2018-01-31 NOTE — Telephone Encounter (Signed)
Returned the call to the patient. He stated that he had a message that it was time for an appointment. He was wondering if it was for his wife. She is due for a 12 month follow up. His appointment for 9/16 has been cancelled and his wife has been put on that slot. He verbalized his understanding.

## 2018-01-31 NOTE — Telephone Encounter (Signed)
No answer. Left message to call back.   

## 2018-02-02 ENCOUNTER — Other Ambulatory Visit: Payer: Self-pay | Admitting: *Deleted

## 2018-02-02 MED ORDER — ATORVASTATIN CALCIUM 20 MG PO TABS: 20.0000 mg | ORAL_TABLET | Freq: Every day | ORAL | 1 refills | Status: DC

## 2018-02-05 NOTE — Telephone Encounter (Signed)
Patient wife calling  States that patient has 2 days left of pills Would like to check on status of assistance, would also like to inquire on samples Please call to discuss

## 2018-02-06 ENCOUNTER — Telehealth: Payer: Self-pay | Admitting: Cardiovascular Disease

## 2018-02-06 NOTE — Telephone Encounter (Signed)
Spoke with patients wife per release form. Patient application was faxed and they have not heard back at this time. Recommended that they call the [employer] to see if they have any updates on this process. We do not currently have any samples and they will check with getting a 30 day refill from their pharmacy. Instructed her to please call me back if they need any further assistance. She verbalized understanding with no further questions.

## 2018-02-06 NOTE — Telephone Encounter (Signed)
Left voicemail message to call back.   No samples are available and application was faxed previously on 01/18/18

## 2018-02-06 NOTE — Telephone Encounter (Signed)
Patient calling the office for samples of medication:   1.  What medication and dosage are you requesting samples for? Entresto 49-51 mg po BID   2.  Are you currently out of this medication? Yes   Leaving Thursday morning to go out of town

## 2018-02-06 NOTE — Telephone Encounter (Signed)
See other telephone encounter.

## 2018-02-06 NOTE — Telephone Encounter (Signed)
Please call regarding Entresto. Pt wife states pt is completely out of this medication.

## 2018-02-06 NOTE — Telephone Encounter (Signed)
Please advise about Samples.

## 2018-02-12 ENCOUNTER — Ambulatory Visit: Payer: Medicare HMO | Admitting: Cardiovascular Disease

## 2018-02-15 DIAGNOSIS — L508 Other urticaria: Secondary | ICD-10-CM | POA: Diagnosis not present

## 2018-03-19 ENCOUNTER — Other Ambulatory Visit: Payer: Self-pay | Admitting: Cardiovascular Disease

## 2018-03-28 DIAGNOSIS — R69 Illness, unspecified: Secondary | ICD-10-CM | POA: Diagnosis not present

## 2018-05-02 DIAGNOSIS — D485 Neoplasm of uncertain behavior of skin: Secondary | ICD-10-CM | POA: Diagnosis not present

## 2018-05-02 DIAGNOSIS — X32XXXA Exposure to sunlight, initial encounter: Secondary | ICD-10-CM | POA: Diagnosis not present

## 2018-05-02 DIAGNOSIS — Z08 Encounter for follow-up examination after completed treatment for malignant neoplasm: Secondary | ICD-10-CM | POA: Diagnosis not present

## 2018-05-02 DIAGNOSIS — L570 Actinic keratosis: Secondary | ICD-10-CM | POA: Diagnosis not present

## 2018-05-02 DIAGNOSIS — Z85828 Personal history of other malignant neoplasm of skin: Secondary | ICD-10-CM | POA: Diagnosis not present

## 2018-05-02 DIAGNOSIS — D044 Carcinoma in situ of skin of scalp and neck: Secondary | ICD-10-CM | POA: Diagnosis not present

## 2018-05-02 DIAGNOSIS — L508 Other urticaria: Secondary | ICD-10-CM | POA: Diagnosis not present

## 2018-05-03 ENCOUNTER — Ambulatory Visit (INDEPENDENT_AMBULATORY_CARE_PROVIDER_SITE_OTHER): Payer: Medicare HMO | Admitting: Sports Medicine

## 2018-05-03 ENCOUNTER — Ambulatory Visit (INDEPENDENT_AMBULATORY_CARE_PROVIDER_SITE_OTHER): Payer: Medicare HMO

## 2018-05-03 ENCOUNTER — Ambulatory Visit: Payer: Self-pay

## 2018-05-03 ENCOUNTER — Encounter: Payer: Self-pay | Admitting: Sports Medicine

## 2018-05-03 VITALS — BP 136/90 | HR 63 | Ht 70.0 in | Wt 232.0 lb

## 2018-05-03 DIAGNOSIS — M25452 Effusion, left hip: Secondary | ICD-10-CM | POA: Diagnosis not present

## 2018-05-03 DIAGNOSIS — E11311 Type 2 diabetes mellitus with unspecified diabetic retinopathy with macular edema: Secondary | ICD-10-CM | POA: Diagnosis not present

## 2018-05-03 DIAGNOSIS — M3130 Wegener's granulomatosis without renal involvement: Secondary | ICD-10-CM | POA: Diagnosis not present

## 2018-05-03 DIAGNOSIS — M25552 Pain in left hip: Secondary | ICD-10-CM

## 2018-05-03 NOTE — Procedures (Signed)
PROCEDURE NOTE:  Ultrasound Guided: Injection: Left hip Images were obtained and interpreted by myself, Teresa Coombs, DO  Images have been saved and stored to PACS system. Images obtained on: GE S7 Ultrasound machine    ULTRASOUND FINDINGS:  Moderate effusioon  DESCRIPTION OF PROCEDURE:  The patient's clinical condition is marked by substantial pain and/or significant functional disability. Other conservative therapy has not provided relief, is contraindicated, or not appropriate. There is a reasonable likelihood that injection will significantly improve the patient's pain and/or functional impairment.   After discussing the risks, benefits and expected outcomes of the injection and all questions were reviewed and answered, the patient wished to undergo the above named procedure.  Verbal consent was obtained.  The ultrasound was used to identify the target structure and adjacent neurovascular structures. The skin was then prepped in sterile fashion and the target structure was injected under direct visualization using sterile technique as below:  Single injection performed as below: PREP: Alcohol and Ethel Chloride APPROACH:direct, stopcock technique, 22g 3.5 in. INJECTATE: 5 cc 1% lidocaine, 2 cc 0.5% Marcaine and 2 cc 40mg /mL DepoMedrol ASPIRATE: None DRESSING: Band-Aid  Post procedural instructions including recommending icing and warning signs for infection were reviewed.    This procedure was well tolerated and there were no complications.   IMPRESSION: Succesful Ultrasound Guided: Injection

## 2018-05-03 NOTE — Patient Instructions (Addendum)

## 2018-05-03 NOTE — Assessment & Plan Note (Signed)
Ultrasound does show a moderate joint effusion.  He had almost complete relief of his symptoms with anesthetic phase of the injection.  He should do well with this.

## 2018-05-03 NOTE — Progress Notes (Signed)
Ryan Morgan. , [facility]  Ryan Morgan - 77 y.o. male MRN [id number]  Date of birth: 1940/11/20  Visit Date: 05/03/2018   PCP: Ryan Greenspan, MD   Referred by: Tower, Wynelle Fanny, MD   SUBJECTIVE:  Chief Complaint  Patient presents with  . Initial Assessment  . Joint/thigh pain    Pain from the L hip to the calf, started Saturday. Pain is shooting, like a pinched nerve. Worse when lifting foot, not as bad when standing. Denies LBP. Pain is moderate but can be severe at times. He has taken IBU and used heat with some relief.     HPI: Patient presents with the above symptoms.  Worse with standing and walking.  Is has been progressive in nature.  Minimal improvement with interventions  REVIEW OF SYSTEMS: Denies night time disturbances. Denies fevers, chills, or night sweats. Denies unexplained weight loss. Denies personal history of cancer. Denies changes in bowel or bladder habits. Denies recent unreported falls. Denies new or worsening dyspnea or wheezing. Denies headaches or dizziness.  Denies numbness, tingling or weakness  In the extremities.  Denies dizziness or presyncopal episodes Denies lower extremity edema   HISTORY:  Prior history reviewed and updated per electronic medical record.  Social History   Occupational History  . Not on file  Tobacco Use  . Smoking status: Former Smoker    Packs/day: 0.50    Years: 20.00    Pack years: 10.00    Types: Cigarettes    Last attempt to quit: 05/30/1978    Years since quitting: 40.1  . Smokeless tobacco: Never Used  Substance and Sexual Activity  . Alcohol use: No    Alcohol/week: 0.0 standard drinks  . Drug use: No  . Sexual activity: Never   Social History   Social History Narrative   Regular exercise: yes   Caffeine use: very little    Past Medical History:  Diagnosis Date  . Coronary artery disease    a. prior LAD stenting 2000; b.  [facility] 2003 LAD 40, D1 50; c. Lexiscan 02/2017: large defect of severe severity in the basal inferoseptal, basal inferior, mid inferoseptal, mid inferior and apical inferior location, high risk; d. LHC 03/29/17: LM nl, patent LAD stent w/ 40% ISR, D1 100,  OM1 90, RPDA 100 w/ L-R collats, post atrio 80% s/p PCI/DES    . Diabetes mellitus   . Hyperlipidemia   . Hypertension   . Ischemic cardiomyopathy    a. TTE 02/2017: EF 30-35%, diffuse HK, normal LV diastolic function parameters, mild AI/MR, RV systolic function normal, PASP normal  . Lung nodule    a. s/p prior thoracotomy  . Morbid obesity ([place])   . 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 BASAL CELL CARCINOMA EXCISION     removed from face x 3  . CARDIAC CATHETERIZATION    . CARDIAC SURGERY  2000   stent in heart  . CORONARY ANGIOPLASTY  09/22/1998   s/p stent placement; Tristar 3.0 x 18 mm Ref [id number]  . CORONARY STENT INTERVENTION N/A 03/29/2017   Procedure: CORONARY STENT INTERVENTION;  Surgeon: Ryan Hampshire, MD;  Location: [facility];  Service: Cardiovascular;  Laterality: N/A;  . LEFT HEART CATH AND CORONARY ANGIOGRAPHY N/A 03/29/2017   Procedure: LEFT HEART CATH AND CORONARY ANGIOGRAPHY;  Surgeon: Ryan Hampshire, MD;  Location: [facility];  Service: Cardiovascular;  Laterality: N/A;  . 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 ULTRASOUND GUIDANCE FOR VASCULAR ACCESS  03/29/2017   Procedure: Ultrasound Guidance For Vascular Access;  Surgeon: Ryan Hampshire, MD;  Location: [facility];  Service: Cardiovascular;;   family history includes Cancer in his mother; Stroke in his maternal grandfather. There is no history of Diabetes.  DATA OBTAINED & REVIEWED:  Recent Labs    10/02/17 1103 11/01/17 0836 11/20/17 1126 01/08/18 1013 05/15/18 0929  HGBA1C 6.4  --   --  6.8* 6.5*  CALCIUM  --  8.7* 8.9  --   --   AST  --   --   14  --   --   ALT  --   --  14  --   --   TSH  --   --  0.91  --   --    Problem  Hip Joint Effusion, Left   Dr Ryan Morgan- derm [facility] - opthy  Cardiology -- Dr Ryan Morgan - in [place]   OBJECTIVE:  VS:  HT:5\' 10"  (177.8 cm)   WT:232 lb (105.2 kg)  BMI:33.29    BP:136/90  HR:63bpm  TEMP: ( )  RESP:96 %   PHYSICAL EXAM: CONSTITUTIONAL: Well-developed, Well-nourished and In no acute distress PSYCHIATRIC : Alert & appropriately interactive. and Not depressed or anxious appearing. RESPIRATORY : No increased work of breathing and Trachea Midline EYES : Pupils are equal., EOM intact without nystagmus. and No scleral icterus.  VASCULAR EXAM : Warm and well perfused NEURO: unremarkable  MSK Exam:  Left hip  Well aligned No significant deformity. No overlying skin changes No focal bony tenderness Marked pain with Stinchfield testing as well as with logroll.   X-rays reviewed with him today to do show well-preserved hip.     ASSESSMENT   1. Left hip pain   2. Hip joint effusion, left   3. Granulomatosis with polyangiitis, unspecified whether renal involvement ([place])   4. 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  Procedures:  US Guided Injection per procedure note  Medications:  No orders of the defined types were placed in this encounter.  Discussion/Instructions: Hip joint effusion, left Ultrasound does show a moderate joint effusion.  He had almost complete relief of his symptoms with anesthetic phase of the injection.  He should do well with this.  RICE (Rest, ICE, Compression, Elevation) principles reviewed with the patient. Discussed red flag symptoms that warrant  earlier emergent evaluation and patient voices understanding. Activity modifications and the importance of avoiding exacerbating activities (limiting pain to no more than a 4 / 10 during or following activity) recommended and discussed.  If any lack of improvement: consider further diagnostic evaluation with MRI left hip and or X-ray/MRI lumbar spine   Return in about 6 weeks (around 06/14/2018).          Gerda Diss, [facility] Physician

## 2018-05-09 DIAGNOSIS — D044 Carcinoma in situ of skin of scalp and neck: Secondary | ICD-10-CM | POA: Diagnosis not present

## 2018-05-10 ENCOUNTER — Telehealth: Payer: Self-pay | Admitting: Family Medicine

## 2018-05-10 ENCOUNTER — Ambulatory Visit (INDEPENDENT_AMBULATORY_CARE_PROVIDER_SITE_OTHER): Payer: Medicare HMO

## 2018-05-10 DIAGNOSIS — Z23 Encounter for immunization: Secondary | ICD-10-CM

## 2018-05-10 MED ORDER — OSELTAMIVIR PHOSPHATE 75 MG PO CAPS: 75.0000 mg | ORAL_CAPSULE | Freq: Every day | ORAL | 0 refills | Status: DC

## 2018-05-10 NOTE — Telephone Encounter (Signed)
Wife had flu  He got flu shot today Px prophylactic tamiflu -sent to [place]

## 2018-05-12 IMAGING — DX DG CHEST 2V
2 series · 2 of 2 positions shown · non-contrast
Comparison: 12/07/2015

CLINICAL DATA: Chronic Cough and congestion ; worsening x 2 mo. Hx
of CAD, DM, HTN, Ayaz granulomatosis.

EXAM:
CHEST  2 VIEW

[chest pa]
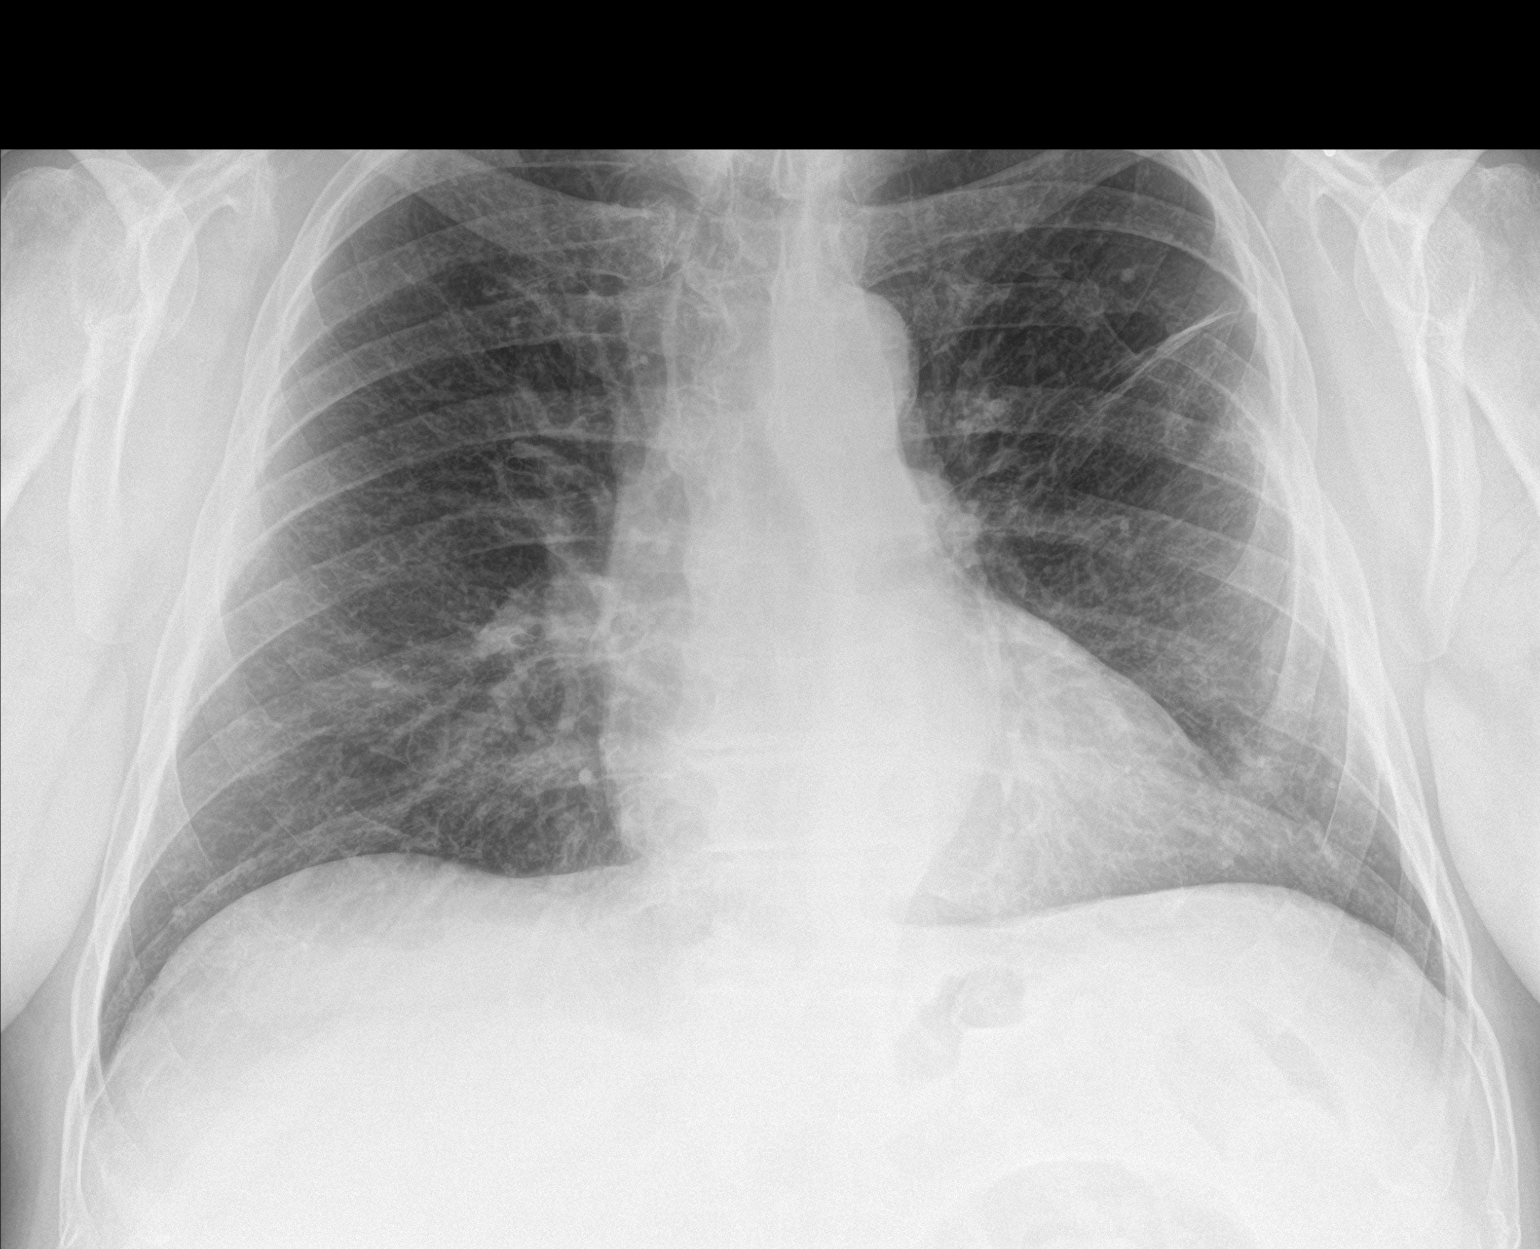

[chest lat]
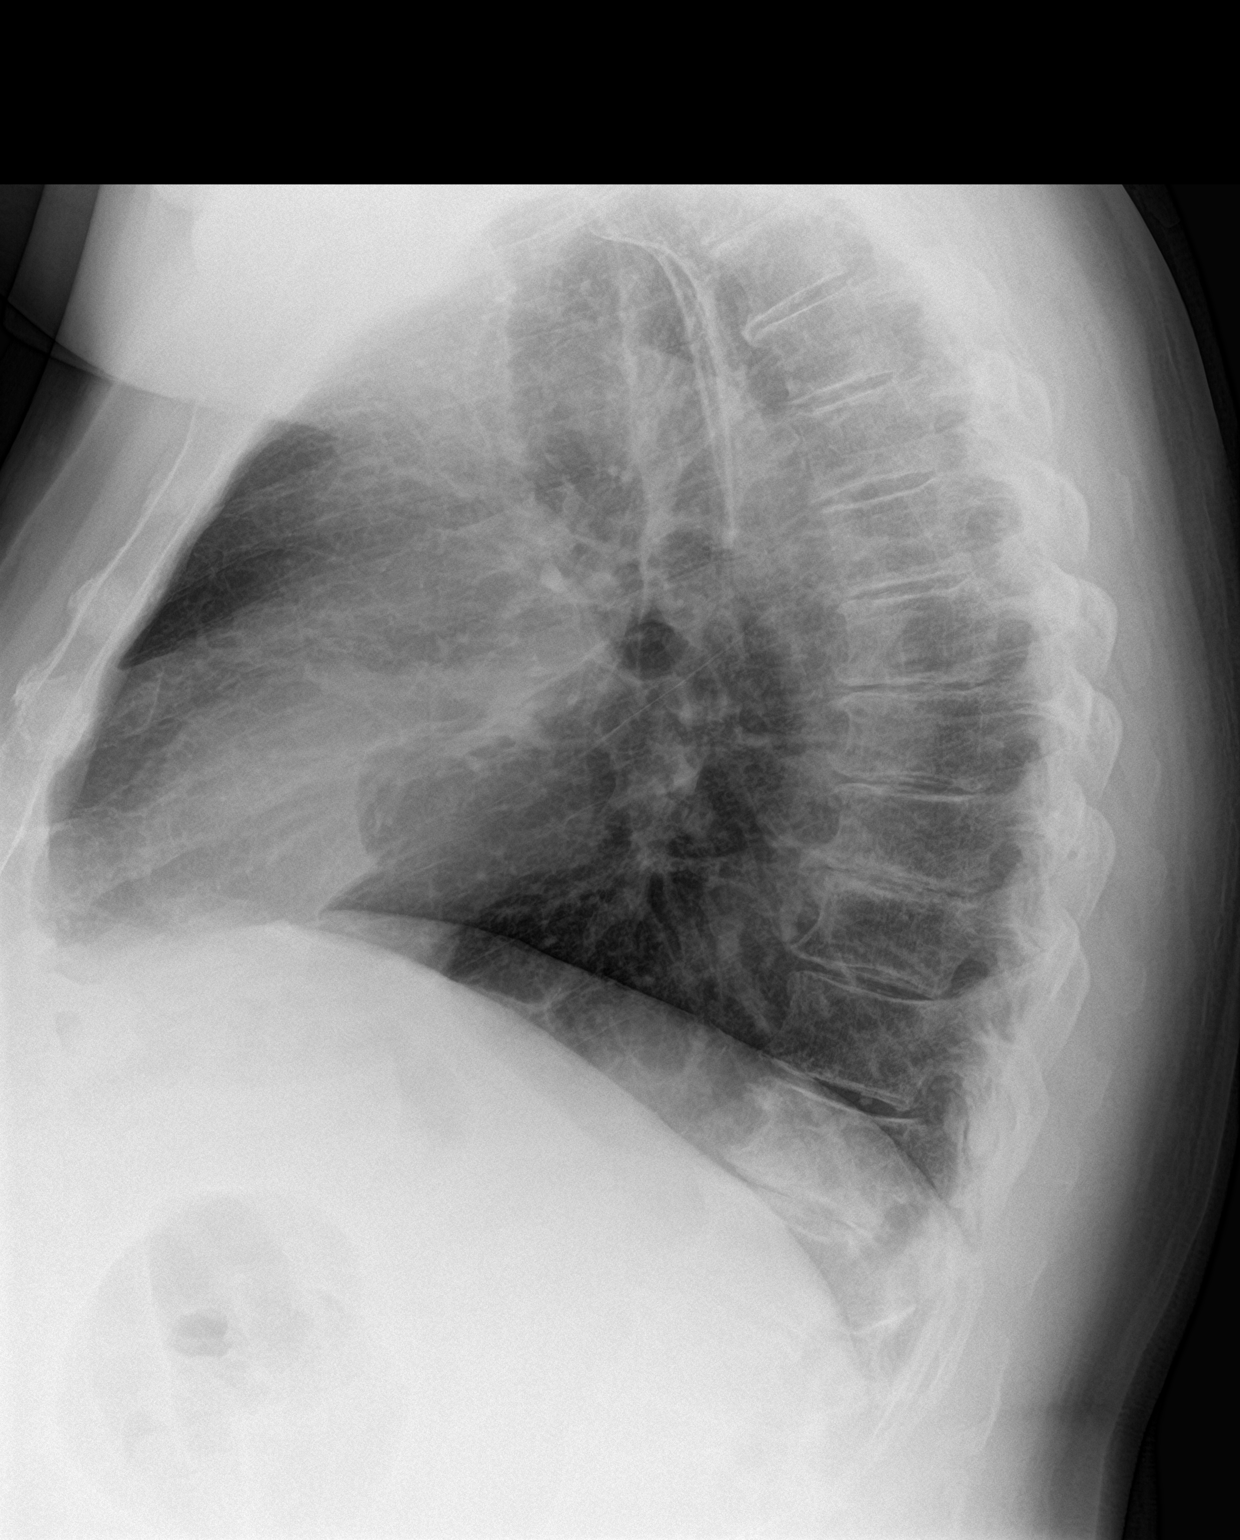

[2 of 2 positions shown; findings below may reference images not displayed]

FINDINGS: Heart size is normal. There is mild perihilar peribronchial
thickening. Stable appearance of scar in the left upper lobe. There
are no focal consolidations, pleural effusions, or pulmonary edema.
Degenerative changes are seen in thoracic spine.
IMPRESSION: 1. Bronchitic changes.
2.  No focal acute pulmonary abnormality.

## 2018-05-15 ENCOUNTER — Ambulatory Visit (INDEPENDENT_AMBULATORY_CARE_PROVIDER_SITE_OTHER): Payer: Medicare HMO | Admitting: Endocrinology

## 2018-05-15 ENCOUNTER — Encounter: Payer: Self-pay | Admitting: Endocrinology

## 2018-05-15 VITALS — BP 136/78 | HR 71 | Ht 70.0 in | Wt 230.8 lb

## 2018-05-15 DIAGNOSIS — E11319 Type 2 diabetes mellitus with unspecified diabetic retinopathy without macular edema: Secondary | ICD-10-CM | POA: Diagnosis not present

## 2018-05-15 LAB — POCT GLYCOSYLATED HEMOGLOBIN (HGB A1C): HEMOGLOBIN A1C: 6.5 % — AB (ref 4.0–5.6)

## 2018-05-15 MED ORDER — REPAGLINIDE 1 MG PO TABS: 1.0000 mg | ORAL_TABLET | Freq: Three times a day (TID) | ORAL | 3 refills | Status: DC

## 2018-05-15 NOTE — Progress Notes (Signed)
Subjective:    Patient ID: Ryan Morgan, male    DOB: [birth year], 77 y.o.   MRN: [id number]  HPI Pt returns for f/u of diabetes mellitus: DM type: 2 Dx'ed: 3976 Complications: polyneuropathy, DR, and CAD.   Therapy: repaglinide DKA: never Severe hypoglycemia: never.   Pancreatitis: never Pancreatic imaging: never Other: he takes intermittent prednisone for Wegener's granulomatosis; he did not tolerate metformin (rash); he cannot afford brand name meds.   Interval history: he took steroids last week.  Pt states cbg varies from 120-150.  There is no trend throughout the day.  pt states he feels well in general.   Past Medical History:  Diagnosis Date  . Coronary artery disease    a. prior LAD stenting 2000; b. [place] 2003 LAD 40, D1 50; c. Lexiscan 02/2017: large defect of severe severity in the basal inferoseptal, basal inferior, mid inferoseptal, mid inferior and apical inferior location, high risk; d. LHC 03/29/17: LM nl, patent LAD stent w/ 40% ISR, D1 100,  OM1 90, RPDA 100 w/ L-R collats, post atrio 80% s/p PCI/DES    . Diabetes mellitus   . Hyperlipidemia   . Hypertension   . Ischemic cardiomyopathy    a. TTE 02/2017: EF 30-35%, diffuse HK, normal LV diastolic function parameters, mild AI/MR, RV systolic function normal, PASP normal  . Lung nodule    a. s/p prior thoracotomy  . Morbid obesity ([place])   . 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 BASAL CELL CARCINOMA EXCISION     removed from face x 3  . CARDIAC CATHETERIZATION    . CARDIAC SURGERY  2000   stent in heart  . CORONARY ANGIOPLASTY  09/22/1998   s/p stent placement; Tristar 3.0 x 18 mm Ref [id number]  . CORONARY STENT INTERVENTION N/A 03/29/2017   Procedure: CORONARY STENT INTERVENTION;  Surgeon: Wellington Hampshire, MD;  Location: [facility];  Service: Cardiovascular;  Laterality: N/A;  . LEFT HEART CATH AND CORONARY ANGIOGRAPHY N/A 03/29/2017   Procedure:  LEFT HEART CATH AND CORONARY ANGIOGRAPHY;  Surgeon: Wellington Hampshire, MD;  Location: [facility];  Service: Cardiovascular;  Laterality: N/A;  . 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 ULTRASOUND GUIDANCE FOR VASCULAR ACCESS  03/29/2017   Procedure: Ultrasound Guidance For Vascular Access;  Surgeon: Wellington Hampshire, MD;  Location: [facility];  Service: Cardiovascular;;    Social History   Socioeconomic History  . Marital status: Married    Spouse name: Not on file  . Number of children: Not on file  . Years of education: Not on file  . Highest education level: Not on file  Occupational History  . Not on file  Social Needs  . Financial resource strain: Not on file  . Food insecurity:    Worry: Not on file    Inability: Not on file  . Transportation needs:    Medical: Not on file    Non-medical: Not on file  Tobacco Use  . Smoking status: Former Smoker    Packs/day: 0.50    Years: 20.00    Pack years: 10.00    Types: Cigarettes    Last attempt to quit: 05/30/1978    Years since quitting: 39.9  . Smokeless tobacco: Never Used  Substance and Sexual Activity  . Alcohol use: No    Alcohol/week: 0.0 standard drinks  . Drug use: No  .  Sexual activity: Never  Lifestyle  . Physical activity:    Days per week: Not on file    Minutes per session: Not on file  . Stress: Not on file  Relationships  . Social connections:    Talks on phone: Not on file    Gets together: Not on file    Attends religious service: Not on file    Active member of club or organization: Not on file    Attends meetings of clubs or organizations: Not on file    Relationship status: Not on file  . Intimate partner violence:    Fear of current or ex partner: Not on file    Emotionally abused: Not on file    Physically abused: Not on file    Forced sexual activity: Not on file  Other Topics Concern  . Not on file  Social History Narrative   Regular exercise:  yes   Caffeine use: very little    Current Outpatient Medications on File Prior to Visit  Medication Sig Dispense Refill  . aspirin EC 81 MG tablet Take 81 mg by mouth daily.    Marland Kitchen atorvastatin (LIPITOR) 20 MG tablet Take 1 tablet (20 mg total) by mouth daily. 90 tablet 1  . carvedilol (COREG) 6.25 MG tablet TAKE 1 TABLET BY MOUTH TWICE DAILY 180 tablet 0  . cetirizine (ZYRTEC) 10 MG tablet Take 20 mg by mouth daily.    . clobetasol cream (TEMOVATE) 4.96 % Apply 1 application topically 2 (two) times daily. To affected areas 30 g 1  . clopidogrel (PLAVIX) 75 MG tablet Take 1 tablet (75 mg total) by mouth daily. 90 tablet 3  . diphenhydrAMINE (BENADRYL) 25 MG tablet Take 25 mg by mouth every 6 (six) hours as needed.    Marland Kitchen ENTRESTO 49-51 MG TAKE 1 TABLET BY MOUTH TWICE DAILY 60 tablet 3  . ezetimibe (ZETIA) 10 MG tablet Take 1 tablet (10 mg total) by mouth daily. 90 tablet 3  . fexofenadine (ALLEGRA) 180 MG tablet Take 180 mg by mouth daily.    Marland Kitchen glucose blood (ONE TOUCH ULTRA TEST) test strip Check blood sugar once daily and as directed Dx E11.319 100 each 1  . hydroxychloroquine (PLAQUENIL) 200 MG tablet Take 200 mg by mouth 2 (two) times daily.    Marland Kitchen ibuprofen (ADVIL,MOTRIN) 200 MG tablet Take 200-800 mg by mouth every 8 (eight) hours as needed (for pain.).    Marland Kitchen LANCETS ULTRA THIN 30G MISC by Does not apply route. One Touch    . Multiple Vitamin (MULTIVITAMIN WITH MINERALS) TABS tablet Take 1 tablet by mouth daily.    . Multiple Vitamins-Minerals (AIRBORNE) TBEF Take 1 tablet by mouth daily as needed (for immune system support). Immune Health/Support    . nystatin cream (MYCOSTATIN) Apply 1 application topically 2 (two) times daily as needed for dry skin.    Marland Kitchen oseltamivir (TAMIFLU) 75 MG capsule Take 1 capsule (75 mg total) by mouth daily. 10 capsule 0  . predniSONE (DELTASONE) 10 MG tablet Take 1-2 tablets (10-20 mg total) by mouth daily as needed (for wegener's granulomatosis). 30 tablet 0  .  tetrahydrozoline 0.05 % ophthalmic solution Place 1-2 drops into both eyes 3 (three) times daily as needed (for dry/irritated eyes).    . Triamcinolone Acetonide (NASACORT ALLERGY 24HR NA) Place 2 sprays into the nose at bedtime.     Marland Kitchen spironolactone (ALDACTONE) 25 MG tablet Take 1 tablet (25 mg total) by mouth daily. 90 tablet 3  . [  DISCONTINUED] nebivolol (BYSTOLIC) 5 MG tablet Take 1 tablet (5 mg total) by mouth 2 (two) times daily. 180 tablet 3   No current facility-administered medications on file prior to visit.     Allergies  Allergen Reactions  . Metformin And Related Itching  . Benicar Hct [Olmesartan Medoxomil-Hctz]     Painful and itchy knots on palms of hands  . Bystolic [Nebivolol Hcl]     headache  . Dapsone Hives  . Lovaza [Omega-3-Acid Ethyl Esters] Other (See Comments)    Unsure of reaction type  . Niaspan [Niacin Er] Other (See Comments)    flushing  . Septra [Bactrim] Hives    Family History  Problem Relation Age of Onset  . Cancer Mother        tumor on tonsil  . Stroke Maternal Grandfather   . Diabetes Neg Hx     BP 136/78 (BP Location: Right Arm, Patient Position: Sitting, Cuff Size: Normal)   Pulse 71   Ht 5\' 10"  (1.778 m)   Wt 230 lb 12.8 oz (104.7 kg)   SpO2 95%   BMI 33.12 kg/m    Review of Systems He denies hypoglycemia    Objective:   Physical Exam VITAL SIGNS:  See vs page GENERAL: no distress Pulses: dorsalis pedis intact bilat.   MSK: no deformity of the feet CV: trace bilat leg edema Skin:  no ulcer on the feet, but the skin is dry and scaly.  normal color and temp on the feet.  Neuro: sensation is intact to touch on the feet, but decreased from normal.  Ext: There is bilateral onychomycosis of the toenails.    Lab Results  Component Value Date   HGBA1C 6.5 (A) 05/15/2018       Assessment & Plan:  Type 2 DM, with CAD: overcontrolled.   Wegener's granulomatosis: he needs to adjust medication for steroid rx.     Patient  Instructions  I have sent a prescription to your pharmacy, to reduce the repaglinide to 1 mg 3 times a day (just before each meal) However, when you are on steroids, take one of your remaining 2 mg pills along with the 1 mg, to keep it from going up. check your blood sugar once a day.  vary the time of day when you check, between before the 3 meals, and at bedtime.  also check if you have symptoms of your blood sugar being too high or too low.  please keep a record of the readings and bring it to your next appointment here (or you can bring the meter itself).  You can write it on any piece of paper.  please call us sooner if your blood sugar goes below 70, or if you have a lot of readings over 200. Please come back for a follow-up appointment in 4 months.

## 2018-05-15 NOTE — Patient Instructions (Signed)
I have sent a prescription to your pharmacy, to reduce the repaglinide to 1 mg 3 times a day (just before each meal) However, when you are on steroids, take one of your remaining 2 mg pills along with the 1 mg, to keep it from going up. check your blood sugar once a day.  vary the time of day when you check, between before the 3 meals, and at bedtime.  also check if you have symptoms of your blood sugar being too high or too low.  please keep a record of the readings and bring it to your next appointment here (or you can bring the meter itself).  You can write it on any piece of paper.  please call us sooner if your blood sugar goes below 70, or if you have a lot of readings over 200. Please come back for a follow-up appointment in 4 months.

## 2018-05-17 ENCOUNTER — Ambulatory Visit: Payer: Medicare HMO | Admitting: Pulmonary Disease

## 2018-05-17 ENCOUNTER — Encounter: Payer: Self-pay | Admitting: Pulmonary Disease

## 2018-05-17 VITALS — BP 136/70 | HR 83 | Temp 97.6°F | Wt 231.0 lb

## 2018-05-17 DIAGNOSIS — K219 Gastro-esophageal reflux disease without esophagitis: Secondary | ICD-10-CM

## 2018-05-17 DIAGNOSIS — M3130 Wegener's granulomatosis without renal involvement: Secondary | ICD-10-CM

## 2018-05-17 MED ORDER — PREDNISONE 10 MG PO TABS: 10.0000 mg | ORAL_TABLET | Freq: Every day | ORAL | 2 refills | Status: DC | PRN

## 2018-05-17 NOTE — Progress Notes (Signed)
Subjective:    Patient ID: Ryan Morgan, male    DOB: 10/25/1940, 77 y.o.   MRN: [id number]  Synopsis: Ryan Morgan was diagnosed with Wegener's granulomatosis in 1997 with joint pain, sinus disease. History with Cytoxan through 1999 for about 2 years total. Since then he had been treated off and on with prednisone. He states that he took prednisone nearly continuously for about 10 years. There is discrepancy in the notes, he feels that he did have significant respiratory symptoms related to Wegener's granulomatosis at one point and was treated with IV Solu-Medrol and hospital for this. In late 2018, early 2019 he was diagnosed with ischemic cardiomyopathy and required a new left heart catheterization and stent as well as starting on Entresto.  HPI Chief Complaint  Patient presents with  . Follow-up    feeling about the same.  prod cough - cream colored   Ryan Morgan was struggling with dyspnea and was found to have ischemic cardiomyopathy.  He had a stent placed and started on Entresto and he feels much better.  In addition to this he has been having some hip pain which required an injection of a steroids.  He continues to use his prednisone as needed, has used 20 tablets this year.  He predominantly uses it for fullness in his right ear.  H has some minimnal sinus symptoms.  Nasacort has been helpful for sinuses.  His wife recently started on tamiflu for pneumonia.  He and his wife are thinking of traveling around the holidays.    Past Medical History:  Diagnosis Date  . Coronary artery disease    a. prior LAD stenting 2000; b. [place] 2003 LAD 40, D1 50; c. Lexiscan 02/2017: large defect of severe severity in the basal inferoseptal, basal inferior, mid inferoseptal, mid inferior and apical inferior location, high risk; d. LHC 03/29/17: LM nl, patent LAD stent w/ 40% ISR, D1 100,  OM1 90, RPDA 100 w/ L-R collats, post atrio 80% s/p PCI/DES    . Diabetes mellitus   . Hyperlipidemia   . Hypertension    . Ischemic cardiomyopathy    a. TTE 02/2017: EF 30-35%, diffuse HK, normal LV diastolic function parameters, mild AI/MR, RV systolic function normal, PASP normal  . Lung nodule    a. s/p prior thoracotomy  . Morbid obesity ([place])   . Wegener's granulomatosis ([place]) 2007    Review of Systems  Constitutional: Negative for chills, fatigue and fever.  HENT: Positive for ear pain. Negative for congestion, postnasal drip and rhinorrhea.   Respiratory: Positive for cough. Negative for shortness of breath and wheezing.   Cardiovascular: Negative for chest pain, palpitations and leg swelling.  Musculoskeletal: Positive for arthralgias and joint swelling.       Objective:   Physical Exam  Vitals:   05/17/18 1004  BP: 136/70  Pulse: 83  Temp: 97.6 F (36.4 C)  SpO2: 96%  Weight: 231 lb (104.8 kg)     Room air  Gen: well appearing HENT: OP clear, TM's clear, neck supple PULM: CTA B, normal percussion CV: RRR, no mgr, trace edema GI: BS+, soft, nontender Derm: no cyanosis or rash Psyche: normal mood and affect    Heart catheterization: October 2018 showed in-stent restenosis of an LAD stent, drug-eluting stent was placed: "Successful angioplasty and drug-eluting stent placement to the ostial right posterior AV groove artery.", LVEF 40%  Pulmonary function test: July 2017 ratio normal, FVC 4.04 L 93% predicted, total lung capacity 7.01 L 99% protected, DLCO  30.50 mL 94% predicted  Chest imaging: August 2018 chest x-ray images independently reviewed showing questionable bronchitis changes but otherwise normal pulmonary parenchyma.     Assessment & Plan:   Granulomatosis with polyangiitis, unspecified whether renal involvement ([place])  Gastroesophageal reflux disease, esophagitis presence not specified   Discussion: From a Wegener's granulomatosis standpoint this is been a stable interval for Ryan Morgan.  There has been some suspicion recently that some of the rash and hip  pain he has experienced is related to his Wegener's.  I think it is a great idea for him to be seen by a small vessel vasculitis specialist at [facility] and I am happy to pass along any records I have to facilitate that.  He continues to use a somewhat unorthodox approach to treating his "Wegener's" symptoms of sinus symptoms and periodic ear fullness with occasional prednisone use.  I must say that this has been quite helpful for him over the years and I have not seen clear evidence of worsening vasculitis.  Plan: Wegener's granulomatosis leading to ear fullness and sinus symptoms Continue using prednisone as needed Follow-up with [facility]  Allergic rhinitis: Continue taking Flonase as you are doing  We will see you back in 6 months or sooner if needed    Current Outpatient Medications:  .  aspirin EC 81 MG tablet, Take 81 mg by mouth daily., Disp: , Rfl:  .  atorvastatin (LIPITOR) 20 MG tablet, Take 1 tablet (20 mg total) by mouth daily., Disp: 90 tablet, Rfl: 1 .  carvedilol (COREG) 6.25 MG tablet, TAKE 1 TABLET BY MOUTH TWICE DAILY, Disp: 180 tablet, Rfl: 0 .  cetirizine (ZYRTEC) 10 MG tablet, Take 20 mg by mouth daily., Disp: , Rfl:  .  clobetasol cream (TEMOVATE) 2.77 %, Apply 1 application topically 2 (two) times daily. To affected areas, Disp: 30 g, Rfl: 1 .  clopidogrel (PLAVIX) 75 MG tablet, Take 1 tablet (75 mg total) by mouth daily., Disp: 90 tablet, Rfl: 3 .  diphenhydrAMINE (BENADRYL) 25 MG tablet, Take 25 mg by mouth every 6 (six) hours as needed., Disp: , Rfl:  .  ENTRESTO 49-51 MG, TAKE 1 TABLET BY MOUTH TWICE DAILY, Disp: 60 tablet, Rfl: 3 .  ezetimibe (ZETIA) 10 MG tablet, Take 1 tablet (10 mg total) by mouth daily., Disp: 90 tablet, Rfl: 3 .  fexofenadine (ALLEGRA) 180 MG tablet, Take 180 mg by mouth daily., Disp: , Rfl:  .  glucose blood (ONE TOUCH ULTRA TEST) test strip, Check blood sugar once daily and as directed Dx E11.319, Disp: 100 each, Rfl: 1 .   hydroxychloroquine (PLAQUENIL) 200 MG tablet, Take 200 mg by mouth 2 (two) times daily., Disp: , Rfl:  .  ibuprofen (ADVIL,MOTRIN) 200 MG tablet, Take 200-800 mg by mouth every 8 (eight) hours as needed (for pain.)., Disp: , Rfl:  .  LANCETS ULTRA THIN 30G MISC, by Does not apply route. One Touch, Disp: , Rfl:  .  Multiple Vitamin (MULTIVITAMIN WITH MINERALS) TABS tablet, Take 1 tablet by mouth daily., Disp: , Rfl:  .  Multiple Vitamins-Minerals (A[place]) TBEF, Take 1 tablet by mouth daily as needed (for immune system support). Immune Health/Support, Disp: , Rfl:  .  nystatin cream (MYCOSTATIN), Apply 1 application topically 2 (two) times daily as needed for dry skin., Disp: , Rfl:  .  oseltamivir (TAMIFLU) 75 MG capsule, Take 1 capsule (75 mg total) by mouth daily., Disp: 10 capsule, Rfl: 0 .  predniSONE (DELTASONE)  10 MG tablet, Take 1-2 tablets (10-20 mg total) by mouth daily as needed (for wegener's granulomatosis)., Disp: 30 tablet, Rfl: 0 .  repaglinide (PRANDIN) 1 MG tablet, Take 1 tablet (1 mg total) by mouth 3 (three) times daily before meals., Disp: 270 tablet, Rfl: 3 .  tetrahydrozoline 0.05 % ophthalmic solution, Place 1-2 drops into both eyes 3 (three) times daily as needed (for dry/irritated eyes)., Disp: , Rfl:  .  Triamcinolone Acetonide (NASACORT ALLERGY 24HR NA), Place 2 sprays into the nose at bedtime. , Disp: , Rfl:  .  spironolactone (ALDACTONE) 25 MG tablet, Take 1 tablet (25 mg total) by mouth daily., Disp: 90 tablet, Rfl: 3

## 2018-05-17 NOTE — Patient Instructions (Signed)
Wegener's granulomatosis leading to ear fullness and sinus symptoms Continue using prednisone as needed Follow-up with [facility]  Allergic rhinitis: Continue taking Flonase as you are doing  We will see you back in 6 months or sooner if needed

## 2018-05-17 NOTE — Addendum Note (Signed)
Addended by: Len Blalock on: 05/17/2018 04:12 PM   Modules accepted: Orders

## 2018-05-17 NOTE — Progress Notes (Signed)
To Dr. Alphonzo Lemmings [facility] rheumatology  May 17, 2018  Dear Dr. Maia Petties:  I have cared for [name] off and on since 2012.  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  Around the time of his diagnosis (I believe 47s 45s) he was hospitalized with severe inflammatory lung disease requiring Cytoxan treatment.  Later he required another hospitalization for pulse Solu-Medrol use.  By the time I met him in 2012 he had not had any severe organ threatening disease but he had been treating periodic ear fullness and sinus congestion with low doses of prednisone (10 mg) from time to time.  He typically will take about 20 tablets of prednisone per year.  Though unorthodox this has been a helpful strategy for him.  I am happy to provide any records which you are not able to view through the epic care everywhere function.  Thanks,   Roselie Awkward, MD

## 2018-05-22 ENCOUNTER — Other Ambulatory Visit: Payer: Self-pay | Admitting: Endocrinology

## 2018-05-22 DIAGNOSIS — R69 Illness, unspecified: Secondary | ICD-10-CM | POA: Diagnosis not present

## 2018-06-01 ENCOUNTER — Other Ambulatory Visit: Payer: Self-pay | Admitting: Cardiovascular Disease

## 2018-06-12 ENCOUNTER — Other Ambulatory Visit: Payer: Self-pay | Admitting: Cardiovascular Disease

## 2018-06-14 ENCOUNTER — Ambulatory Visit: Payer: Medicare HMO | Admitting: Sports Medicine

## 2018-06-14 ENCOUNTER — Encounter: Payer: Self-pay | Admitting: Sports Medicine

## 2018-06-14 ENCOUNTER — Other Ambulatory Visit: Payer: Self-pay | Admitting: Physician Assistant

## 2018-06-14 VITALS — BP 126/78 | HR 61 | Ht 70.0 in | Wt 234.4 lb

## 2018-06-14 DIAGNOSIS — R21 Rash and other nonspecific skin eruption: Secondary | ICD-10-CM | POA: Diagnosis not present

## 2018-06-14 DIAGNOSIS — M3130 Wegener's granulomatosis without renal involvement: Secondary | ICD-10-CM | POA: Diagnosis not present

## 2018-06-14 DIAGNOSIS — M25552 Pain in left hip: Secondary | ICD-10-CM | POA: Diagnosis not present

## 2018-06-14 DIAGNOSIS — R69 Illness, unspecified: Secondary | ICD-10-CM | POA: Diagnosis not present

## 2018-06-14 NOTE — Patient Instructions (Signed)
Vacillating between clobetasol and.  Glad her hip is doing better.  I have asked you back in point for any joint issues.

## 2018-06-14 NOTE — Progress Notes (Signed)
Ryan Morgan. Ryan Morgan, [place] [facility] [place]  Ryan Morgan - 78 y.o. male MRN [id number]  Date of birth: [birth year]  Visit Date: June 14, 2018  PCP: Abner Greenspan, MD   Referred by: Tower, Wynelle Fanny, MD  SUBJECTIVE:  Chief Complaint  Patient presents with  . Follow-up    L hip pain.  L hip injection on 05/03/18.    HPI: Patient presents for follow-up of left hip pain following injection on 05/03/2018.  He denies any symptoms but he has noticed that his with subsequent good relief following that.  He does report having persistent leg pain progressively worsened to the point it was bothering his right leg after leaving and found that the dapsone for recently started on for his rash was contributing to this and this he subsequently discontinued his use that his symptoms have completely resolved.  REVIEW OF SYSTEMS: Per HPI  HISTORY:  Prior history reviewed and updated per electronic medical record.  Social History   Occupational History  . Not on file  Tobacco Use  . Smoking status: Former Smoker    Packs/day: 0.50    Years: 20.00    Pack years: 10.00    Types: Cigarettes    Last attempt to quit: 05/30/1978    Years since quitting: 40.0  . Smokeless tobacco: Never Used  Substance and Sexual Activity  . Alcohol use: No    Alcohol/week: 0.0 standard drinks  . Drug use: No  . Sexual activity: Never   Social History   Social History Narrative   Regular exercise: yes   Caffeine use: very little    DATA OBTAINED & REVIEWED:  Recent Labs    10/02/17 1103 11/01/17 0836 11/20/17 1126 01/08/18 1013 05/15/18 0929  HGBA1C 6.4  --   --  6.8* 6.5*  CALCIUM  --  8.7* 8.9  --   --   AST  --   --  14  --   --   ALT  --   --  14  --   --   TSH  --   --  0.91  --   --    OBJECTIVE:  VS:  HT:5\' 10"  (177.8 cm)   WT:234 lb 6.4 oz (106.3 kg)  BMI:33.63    BP:126/78  HR:61bpm  TEMP: ( )  RESP:96 %   PHYSICAL  EXAM: Adult male.  No acute distress.  Alert and appropriate.  Bilateral hips have 30 to 40 degrees of hip arc in a flexed position.  Is pain-free at end range.  He has some weakness with straight leg raise bilaterally and poor muscle definition of his bilateral quad muscles however has a negative Stinchfield test and extensor mechanism is intact although weak.  He does have a erythematous rash across the palmar aspect of his left greater than right hand.  There is dry thinning skin.  He has otherwise senile changes no significant skin breakdown.   ASSESSMENT  1. Left hip pain   2. Granulomatosis with polyangiitis, unspecified whether renal involvement ([place])   3. Rash of hands      PROCEDURES:  None  PLAN:  Pertinent additional documentation may be included in corresponding procedure notes, imaging studies, problem based documentation and patient instructions.  No problem-specific Assessment & Plan notes found for this encounter.  His hips are doing quite well I do suspect this was related to his underlying Wegener's disease.  If any recurrent  monoarticular joint irritation he will likely benefit from a direct intra-articular injection compared to systemic treatment.  We will try to avoid using prednisone given the fact he was on this for quite some time previously.  Right hand says likely related to the underlying Wegener's and in addition to the clobetasol that he has previously been prescribed I did recommend that he try using moisturizing ointment such as Vaseline on a regular basis otherwise will defer to dermatology.   Activity modifications and the importance of avoiding exacerbating activities (limiting pain to no more than a 4 / 10 during or following activity) recommended and discussed. Discussed red flag symptoms that warrant earlier emergent evaluation and patient voices understanding. No orders of the defined types were placed in this encounter.  Lab Orders  No laboratory  test(s) ordered today   Imaging Orders  No imaging studies ordered today   Referral Orders  No referral(s) requested today    Return if symptoms worsen or fail to improve.          Gerda Diss, [place] [facility] Physician

## 2018-06-21 ENCOUNTER — Telehealth: Payer: Self-pay | Admitting: Pulmonary Disease

## 2018-06-21 DIAGNOSIS — M3130 Wegener's granulomatosis without renal involvement: Secondary | ICD-10-CM

## 2018-06-21 NOTE — Telephone Encounter (Signed)
Patients wife states we are referring him to Dr. Alphonzo Lemmings.They have not received the referral,I do not see where BQ states to refer to Dr. Maia Petties ibuprofen just see where you say to follow up with [facility].  BQ please advise.

## 2018-06-22 NOTE — Telephone Encounter (Signed)
OK to make referral

## 2018-06-22 NOTE — Telephone Encounter (Signed)
I have placed an order for the referral to Dr. Maia Petties office and notified the wife nothing further is needed at this time.

## 2018-07-22 DIAGNOSIS — R69 Illness, unspecified: Secondary | ICD-10-CM | POA: Diagnosis not present

## 2018-08-08 ENCOUNTER — Telehealth: Payer: Self-pay | Admitting: Cardiovascular Disease

## 2018-08-08 ENCOUNTER — Other Ambulatory Visit: Payer: Self-pay | Admitting: Cardiovascular Disease

## 2018-08-08 NOTE — Telephone Encounter (Signed)
lmov to schedule  °

## 2018-08-08 NOTE — Telephone Encounter (Signed)
-----   Message from Anselm Pancoast, [place] sent at 08/08/2018  2:25 PM EDT ----- Please contact patient for a follow up appointment.  The patient was to follow up with Dr. Rockey Situ in Dec. 2019. He is requesting refills on meds.  Thanks, Ivin Booty

## 2018-08-17 ENCOUNTER — Other Ambulatory Visit: Payer: Self-pay | Admitting: Endocrinology

## 2018-08-17 ENCOUNTER — Other Ambulatory Visit: Payer: Self-pay | Admitting: Physician Assistant

## 2018-08-17 ENCOUNTER — Other Ambulatory Visit: Payer: Self-pay | Admitting: Cardiovascular Disease

## 2018-08-20 ENCOUNTER — Telehealth: Payer: Self-pay | Admitting: Cardiovascular Disease

## 2018-08-20 MED ORDER — SACUBITRIL-VALSARTAN 49-51 MG PO TABS: ORAL_TABLET | ORAL | 0 refills | Status: DC

## 2018-08-20 MED ORDER — SPIRONOLACTONE 25 MG PO TABS: 25.0000 mg | ORAL_TABLET | Freq: Every day | ORAL | 0 refills | Status: DC

## 2018-08-20 MED ORDER — EZETIMIBE 10 MG PO TABS: 10.0000 mg | ORAL_TABLET | Freq: Every day | ORAL | 0 refills | Status: DC

## 2018-08-20 NOTE — Telephone Encounter (Signed)
Patient calling Patient received medication refills on 3/11 with no further refills - patient is concerned about running out and having to make multiple trips to pharmacy  Please advise on appointment or more refills

## 2018-08-20 NOTE — Telephone Encounter (Signed)
I spoke with the patient. I advised him that he is overdue for follow up with Dr. Rockey Situ.  However, in light of the COVID-19 pandemic, we will honor a 90-day supply of refills for his cardiac medications.   I have verified with the patient that he is currently taking and in need of refills for: - entresto 49/51 mg BID - zetia 10 mg QD - spironolactone 25 mg QD  The patient is aware we will be in touch with him to schedule a follow up appointment 8+ weeks out.   He voices understanding and is agreeable.   RX refills sent to the [facility] on [address].

## 2018-08-20 NOTE — Telephone Encounter (Signed)
Made in error

## 2018-08-24 ENCOUNTER — Other Ambulatory Visit: Payer: Self-pay

## 2018-08-24 ENCOUNTER — Encounter (HOSPITAL_COMMUNITY): Payer: Self-pay

## 2018-08-24 ENCOUNTER — Observation Stay (HOSPITAL_COMMUNITY)
Admission: EM | Admit: 2018-08-24 | Discharge: 2018-08-25 | Disposition: A | Discharge status: Home / Self Care | Payer: Medicare HMO | Attending: Internal Medicine | Admitting: Internal Medicine

## 2018-08-24 DIAGNOSIS — E119 Type 2 diabetes mellitus without complications: Secondary | ICD-10-CM | POA: Diagnosis not present

## 2018-08-24 DIAGNOSIS — E785 Hyperlipidemia, unspecified: Secondary | ICD-10-CM | POA: Diagnosis not present

## 2018-08-24 DIAGNOSIS — R000 Tachycardia, unspecified: Secondary | ICD-10-CM | POA: Diagnosis not present

## 2018-08-24 DIAGNOSIS — Z87891 Personal history of nicotine dependence: Secondary | ICD-10-CM | POA: Insufficient documentation

## 2018-08-24 DIAGNOSIS — R001 Bradycardia, unspecified: Secondary | ICD-10-CM | POA: Diagnosis not present

## 2018-08-24 DIAGNOSIS — Z85828 Personal history of other malignant neoplasm of skin: Secondary | ICD-10-CM | POA: Diagnosis not present

## 2018-08-24 DIAGNOSIS — E669 Obesity, unspecified: Secondary | ICD-10-CM | POA: Diagnosis present

## 2018-08-24 DIAGNOSIS — E1142 Type 2 diabetes mellitus with diabetic polyneuropathy: Secondary | ICD-10-CM | POA: Diagnosis present

## 2018-08-24 DIAGNOSIS — Z794 Long term (current) use of insulin: Secondary | ICD-10-CM | POA: Diagnosis not present

## 2018-08-24 DIAGNOSIS — I255 Ischemic cardiomyopathy: Secondary | ICD-10-CM | POA: Insufficient documentation

## 2018-08-24 DIAGNOSIS — M3130 Wegener's granulomatosis without renal involvement: Secondary | ICD-10-CM | POA: Diagnosis present

## 2018-08-24 DIAGNOSIS — Z6833 Body mass index (BMI) 33.0-33.9, adult: Secondary | ICD-10-CM | POA: Diagnosis present

## 2018-08-24 DIAGNOSIS — I110 Hypertensive heart disease with heart failure: Secondary | ICD-10-CM | POA: Insufficient documentation

## 2018-08-24 DIAGNOSIS — I5022 Chronic systolic (congestive) heart failure: Secondary | ICD-10-CM | POA: Insufficient documentation

## 2018-08-24 DIAGNOSIS — Z79899 Other long term (current) drug therapy: Secondary | ICD-10-CM | POA: Insufficient documentation

## 2018-08-24 DIAGNOSIS — D649 Anemia, unspecified: Secondary | ICD-10-CM | POA: Diagnosis not present

## 2018-08-24 DIAGNOSIS — Z7982 Long term (current) use of aspirin: Secondary | ICD-10-CM | POA: Insufficient documentation

## 2018-08-24 DIAGNOSIS — Z955 Presence of coronary angioplasty implant and graft: Secondary | ICD-10-CM | POA: Insufficient documentation

## 2018-08-24 DIAGNOSIS — E1165 Type 2 diabetes mellitus with hyperglycemia: Secondary | ICD-10-CM | POA: Diagnosis not present

## 2018-08-24 DIAGNOSIS — R55 Syncope and collapse: Secondary | ICD-10-CM | POA: Diagnosis present

## 2018-08-24 DIAGNOSIS — I2510 Atherosclerotic heart disease of native coronary artery without angina pectoris: Secondary | ICD-10-CM

## 2018-08-24 DIAGNOSIS — R531 Weakness: Secondary | ICD-10-CM | POA: Insufficient documentation

## 2018-08-24 DIAGNOSIS — I25119 Atherosclerotic heart disease of native coronary artery with unspecified angina pectoris: Secondary | ICD-10-CM | POA: Insufficient documentation

## 2018-08-24 DIAGNOSIS — E11319 Type 2 diabetes mellitus with unspecified diabetic retinopathy without macular edema: Secondary | ICD-10-CM | POA: Insufficient documentation

## 2018-08-24 DIAGNOSIS — Z7902 Long term (current) use of antithrombotics/antiplatelets: Secondary | ICD-10-CM | POA: Diagnosis not present

## 2018-08-24 DIAGNOSIS — Z791 Long term (current) use of non-steroidal anti-inflammatories (NSAID): Secondary | ICD-10-CM | POA: Insufficient documentation

## 2018-08-24 DIAGNOSIS — Z7952 Long term (current) use of systemic steroids: Secondary | ICD-10-CM | POA: Diagnosis not present

## 2018-08-24 DIAGNOSIS — I25118 Atherosclerotic heart disease of native coronary artery with other forms of angina pectoris: Secondary | ICD-10-CM | POA: Diagnosis present

## 2018-08-24 DIAGNOSIS — R0902 Hypoxemia: Secondary | ICD-10-CM | POA: Diagnosis not present

## 2018-08-24 DIAGNOSIS — I10 Essential (primary) hypertension: Secondary | ICD-10-CM | POA: Diagnosis present

## 2018-08-24 DIAGNOSIS — R42 Dizziness and giddiness: Secondary | ICD-10-CM | POA: Diagnosis not present

## 2018-08-24 LAB — CBC WITH DIFFERENTIAL/PLATELET
Abs Immature Granulocytes: 0.02 10*3/uL (ref 0.00–0.07)
Basophils Absolute: 0 10*3/uL (ref 0.0–0.1)
Basophils Relative: 1 %
Eosinophils Absolute: 0.1 10*3/uL (ref 0.0–0.5)
Eosinophils Relative: 1 %
HCT: 36.2 % — ABNORMAL LOW (ref 39.0–52.0)
Hemoglobin: 12.2 g/dL — ABNORMAL LOW (ref 13.0–17.0)
Immature Granulocytes: 0 %
Lymphocytes Relative: 15 %
Lymphs Abs: 0.9 10*3/uL (ref 0.7–4.0)
MCH: 31.8 pg (ref 26.0–34.0)
MCHC: 33.7 g/dL (ref 30.0–36.0)
MCV: 94.3 fL (ref 80.0–100.0)
MONOS PCT: 7 %
Monocytes Absolute: 0.4 10*3/uL (ref 0.1–1.0)
Neutro Abs: 4.5 10*3/uL (ref 1.7–7.7)
Neutrophils Relative %: 76 %
Platelets: 157 10*3/uL (ref 150–400)
RBC: 3.84 MIL/uL — ABNORMAL LOW (ref 4.22–5.81)
RDW: 13.1 % (ref 11.5–15.5)
WBC: 5.8 10*3/uL (ref 4.0–10.5)
nRBC: 0 % (ref 0.0–0.2)

## 2018-08-24 LAB — BASIC METABOLIC PANEL
Anion gap: 10 (ref 5–15)
BUN: 12 mg/dL (ref 8–23)
CO2: 22 mmol/L (ref 22–32)
Calcium: 8.9 mg/dL (ref 8.9–10.3)
Chloride: 106 mmol/L (ref 98–111)
Creatinine, Ser: 0.99 mg/dL (ref 0.61–1.24)
GFR calc Af Amer: >60 mL/min (ref >60)
GFR calc non Af Amer: >60 mL/min (ref >60)
GLUCOSE: 221 mg/dL — AB (ref 70–99)
POTASSIUM: 4.7 mmol/L (ref 3.5–5.1)
Sodium: 138 mmol/L (ref 135–145)

## 2018-08-24 LAB — TSH: TSH: 1.115 u[IU]/mL (ref 0.350–4.500)

## 2018-08-24 LAB — URINALYSIS, ROUTINE W REFLEX MICROSCOPIC
Bilirubin Urine: NEGATIVE
Glucose, UA: 50 mg/dL — AB
HGB URINE DIPSTICK: NEGATIVE
Ketones, ur: NEGATIVE mg/dL
Leukocytes,Ua: NEGATIVE
Nitrite: NEGATIVE
Protein, ur: NEGATIVE mg/dL
Specific Gravity, Urine: 1.012 (ref 1.005–1.030)
pH: 5 (ref 5.0–8.0)

## 2018-08-24 LAB — CBC
HCT: 36.2 % — ABNORMAL LOW (ref 39.0–52.0)
Hemoglobin: 12.3 g/dL — ABNORMAL LOW (ref 13.0–17.0)
MCH: 31.5 pg (ref 26.0–34.0)
MCHC: 34 g/dL (ref 30.0–36.0)
MCV: 92.8 fL (ref 80.0–100.0)
Platelets: 157 10*3/uL (ref 150–400)
RBC: 3.9 MIL/uL — ABNORMAL LOW (ref 4.22–5.81)
RDW: 13 % (ref 11.5–15.5)
WBC: 4.9 10*3/uL (ref 4.0–10.5)
nRBC: 0 % (ref 0.0–0.2)

## 2018-08-24 LAB — HEMOGLOBIN A1C
Hgb A1c MFr Bld: 7.6 % — ABNORMAL HIGH (ref 4.8–5.6)
Mean Plasma Glucose: 171.42 mg/dL

## 2018-08-24 LAB — CREATININE, SERUM
Creatinine, Ser: 0.83 mg/dL (ref 0.61–1.24)
GFR calc Af Amer: >60 mL/min (ref >60)
GFR calc non Af Amer: >60 mL/min (ref >60)

## 2018-08-24 LAB — I-STAT TROPONIN, ED: Troponin i, poc: 0 ng/mL (ref 0.00–0.08)

## 2018-08-24 LAB — GLUCOSE, CAPILLARY: Glucose-Capillary: 129 mg/dL — ABNORMAL HIGH (ref 70–99)

## 2018-08-24 LAB — TROPONIN I: Troponin I: <0.03 ng/mL (ref <0.03)

## 2018-08-24 MED ORDER — CLOPIDOGREL BISULFATE 75 MG PO TABS
75.0000 mg | ORAL_TABLET | Freq: Every day | ORAL | Status: DC
  Administered 2018-08-25: 75 mg via ORAL
  Filled 2018-08-24: qty 1

## 2018-08-24 MED ORDER — HYDROXYCHLOROQUINE SULFATE 200 MG PO TABS: 200.0000 mg | ORAL_TABLET | Freq: Two times a day (BID) | ORAL | Status: DC

## 2018-08-24 MED ORDER — REPAGLINIDE 1 MG PO TABS
2.0000 mg | ORAL_TABLET | Freq: Three times a day (TID) | ORAL | Status: DC
  Administered 2018-08-25: 2 mg via ORAL
  Filled 2018-08-24 (×3): qty 2

## 2018-08-24 MED ORDER — ENOXAPARIN SODIUM 40 MG/0.4ML ~~LOC~~ SOLN
40.0000 mg | SUBCUTANEOUS | Status: DC
  Administered 2018-08-24: 40 mg via SUBCUTANEOUS
  Filled 2018-08-24: qty 0.4

## 2018-08-24 MED ORDER — ONDANSETRON HCL 4 MG PO TABS: 4.0000 mg | ORAL_TABLET | Freq: Four times a day (QID) | ORAL | Status: DC | PRN

## 2018-08-24 MED ORDER — ACETAMINOPHEN 325 MG PO TABS: 650.0000 mg | ORAL_TABLET | Freq: Four times a day (QID) | ORAL | Status: DC | PRN

## 2018-08-24 MED ORDER — SACUBITRIL-VALSARTAN 49-51 MG PO TABS
1.0000 | ORAL_TABLET | Freq: Two times a day (BID) | ORAL | Status: DC
  Administered 2018-08-24 – 2018-08-25 (×2): 1 via ORAL
  Filled 2018-08-24 (×2): qty 1

## 2018-08-24 MED ORDER — EZETIMIBE 10 MG PO TABS
10.0000 mg | ORAL_TABLET | Freq: Every day | ORAL | Status: DC
  Administered 2018-08-25: 10 mg via ORAL
  Filled 2018-08-24: qty 1

## 2018-08-24 MED ORDER — CARVEDILOL 12.5 MG PO TABS
6.2500 mg | ORAL_TABLET | Freq: Two times a day (BID) | ORAL | Status: DC
  Administered 2018-08-24: 6.25 mg via ORAL
  Filled 2018-08-24: qty 1

## 2018-08-24 MED ORDER — ONDANSETRON HCL 4 MG/2ML IJ SOLN: 4.0000 mg | Freq: Four times a day (QID) | INTRAMUSCULAR | Status: DC | PRN

## 2018-08-24 MED ORDER — ADULT MULTIVITAMIN W/MINERALS CH
1.0000 | ORAL_TABLET | Freq: Every day | ORAL | Status: DC
  Administered 2018-08-25: 1 via ORAL
  Filled 2018-08-24: qty 1

## 2018-08-24 MED ORDER — LORATADINE 10 MG PO TABS
10.0000 mg | ORAL_TABLET | Freq: Every day | ORAL | Status: DC
  Administered 2018-08-25: 10 mg via ORAL
  Filled 2018-08-24: qty 1

## 2018-08-24 MED ORDER — ATORVASTATIN CALCIUM 10 MG PO TABS
20.0000 mg | ORAL_TABLET | Freq: Every day | ORAL | Status: DC
  Administered 2018-08-25: 20 mg via ORAL
  Filled 2018-08-24: qty 2

## 2018-08-24 MED ORDER — BISACODYL 5 MG PO TBEC: 5.0000 mg | DELAYED_RELEASE_TABLET | Freq: Every day | ORAL | Status: DC | PRN

## 2018-08-24 MED ORDER — ASPIRIN EC 81 MG PO TBEC
81.0000 mg | DELAYED_RELEASE_TABLET | Freq: Every day | ORAL | Status: DC
  Administered 2018-08-25: 81 mg via ORAL
  Filled 2018-08-24: qty 1

## 2018-08-24 MED ORDER — SODIUM CHLORIDE 0.9 % IV BOLUS
1000.0000 mL | Freq: Once | INTRAVENOUS | Status: AC
  Administered 2018-08-24: 1000 mL via INTRAVENOUS

## 2018-08-24 MED ORDER — SPIRONOLACTONE 25 MG PO TABS
25.0000 mg | ORAL_TABLET | Freq: Every day | ORAL | Status: DC
  Administered 2018-08-25: 25 mg via ORAL
  Filled 2018-08-24: qty 1

## 2018-08-24 MED ORDER — ACETAMINOPHEN 650 MG RE SUPP: 650.0000 mg | Freq: Four times a day (QID) | RECTAL | Status: DC | PRN

## 2018-08-24 MED ORDER — PREDNISONE 20 MG PO TABS: 10.0000 mg | ORAL_TABLET | Freq: Every day | ORAL | Status: DC

## 2018-08-24 MED ORDER — INSULIN ASPART 100 UNIT/ML ~~LOC~~ SOLN: 0.0000 [IU] | Freq: Three times a day (TID) | SUBCUTANEOUS | Status: DC

## 2018-08-24 NOTE — ED Notes (Signed)
Attempted report x1. 

## 2018-08-24 NOTE — H&P (Signed)
History and Physical    DOA: 08/24/2018  PCP: Abner Greenspan, MD  Patient coming from: Home  Chief Complaint: Near syncope HPI: Ryan Morgan is a 78 y.o. male with history h/o CAD status post PTCA, ischemic cardiomyopathy with EF of 35%, hyperlipidemia, hypertension, diabetes mellitus, Wegener's granulomatosis presents with complaints of near syncope episode.  Patient states he was in his usual state of health this morning until about 2 PM when he was talking to his neighbors in the yard.  He states he carried a chair for about 15 feet (did not feel exerted) to sit down and talk.  He states he suddenly felt "sick and drained out".  He describes as feeling nauseous, sweaty and lightheaded like he might pass out.  He reports feeling near syncopal while he walked back into his living room and sat down on the couch before calling family members to help.  He denies any chest pain per se.  He denies any leg swellings or recent symptoms of exertional dyspnea.  In fact he brisk walks half a mile a day without any chest pain or dyspnea.  His blood glucose was apparently 130 upon EMS check. Work-up in the ED essentially has pain unremarkable with normal vital signs except for mild sinus bradycardia and normal labs except for blood glucose of 220.  Orthostatic vital signs did not show any change in blood pressure per ED physician report.  Given his significant cardiac history, ED physician reluctant to send patient home and discussed case with on-call cardiologist Dr. Stanford Breed who felt admitting overnight for serial cardiac enzymes was reasonable. Hospitalist service requested to admit patient for overnight observation.  Patient in good spirits currently and denies any complaints.  Feels back to his baseline.  He denies any travel history or sick contacts.  Review of Systems: As per HPI otherwise 10 point review of systems negative.    Past Medical History:  Diagnosis Date  . Coronary artery disease    a.  prior LAD stenting 2000; b. [place] 2003 LAD 40, D1 50; c. Lexiscan 02/2017: large defect of severe severity in the basal inferoseptal, basal inferior, mid inferoseptal, mid inferior and apical inferior location, high risk; d. LHC 03/29/17: LM nl, patent LAD stent w/ 40% ISR, D1 100,  OM1 90, RPDA 100 w/ L-R collats, post atrio 80% s/p PCI/DES    . Diabetes mellitus   . Hyperlipidemia   . Hypertension   . Ischemic cardiomyopathy    a. TTE 02/2017: EF 30-35%, diffuse HK, normal LV diastolic function parameters, mild AI/MR, RV systolic function normal, PASP normal  . Lung nodule    a. s/p prior thoracotomy  . Morbid obesity ([place])   . Wegener's granulomatosis ([place]) 2007    Past Surgical History:  Procedure Laterality Date  . BASAL CELL CARCINOMA EXCISION     removed from face x 3  . CARDIAC CATHETERIZATION    . CARDIAC SURGERY  2000   stent in heart  . CORONARY ANGIOPLASTY  09/22/1998   s/p stent placement; Tristar 3.0 x 18 mm Ref [id number]  . CORONARY STENT INTERVENTION N/A 03/29/2017   Procedure: CORONARY STENT INTERVENTION;  Surgeon: Wellington Hampshire, MD;  Location: [place];  Service: Cardiovascular;  Laterality: N/A;  . LEFT HEART CATH AND CORONARY ANGIOGRAPHY N/A 03/29/2017   Procedure: LEFT HEART CATH AND CORONARY ANGIOGRAPHY;  Surgeon: Wellington Hampshire, MD;  Location: [facility];  Service: Cardiovascular;  Laterality: N/A;  . LUNG  SURGERY     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  . ULTRASOUND GUIDANCE FOR VASCULAR ACCESS  03/29/2017   Procedure: Ultrasound Guidance For Vascular Access;  Surgeon: Wellington Hampshire, MD;  Location: [facility];  Service: Cardiovascular;;    Social history:  reports that he quit smoking about 40 years ago. His smoking use included cigarettes. He has a 10.00 pack-year smoking history. He has never used smokeless tobacco. He reports that he does not drink alcohol or use drugs.   Allergies  Allergen Reactions  . Metformin  And Related Itching  . Benicar Hct [Olmesartan Medoxomil-Hctz]     Painful and itchy knots on palms of hands  . Bystolic [Nebivolol Hcl]     headache  . Dapsone Hives  . Lovaza [Omega-3-Acid Ethyl Esters] Other (See Comments)    Unsure of reaction type  . Niaspan [Niacin Er] Other (See Comments)    flushing  . Septra [Bactrim] Hives    Family History  Problem Relation Age of Onset  . Cancer Mother        tumor on tonsil  . Stroke Maternal Grandfather   . Diabetes Neg Hx       Prior to Admission medications   Medication Sig Start Date End Date Taking? Authorizing Provider  aspirin EC 81 MG tablet Take 81 mg by mouth daily.    [provider]  atorvastatin (LIPITOR) 20 MG tablet Take 1 tablet (20 mg total) by mouth daily. 02/02/18   Tower, Wynelle Fanny, MD  carvedilol (COREG) 6.25 MG tablet Take 1 tablet by mouth twice daily 08/17/18   Minna Merritts, MD  cetirizine (ZYRTEC) 10 MG tablet Take 20 mg by mouth daily.    [provider]  clobetasol cream (TEMOVATE) 2.50 % Apply 1 application topically 2 (two) times daily. To affected areas 04/19/17   Ria Bush, MD  clopidogrel (PLAVIX) 75 MG tablet Take 1 tablet by mouth once daily 08/17/18   Minna Merritts, MD  diphenhydrAMINE (BENADRYL) 25 MG tablet Take 25 mg by mouth every 6 (six) hours as needed.    [provider]  ezetimibe (ZETIA) 10 MG tablet Take 1 tablet (10 mg total) by mouth daily. 08/20/18   Minna Merritts, MD  fexofenadine (ALLEGRA) 180 MG tablet Take 180 mg by mouth daily.    [provider]  glucose blood (ONE TOUCH ULTRA TEST) test strip USE 1 STRIP TO CHECK GLUCOSE ONCE DAILY AS DIRECTED 08/17/18   Renato Shin, MD  hydroxychloroquine (PLAQUENIL) 200 MG tablet Take 200 mg by mouth 2 (two) times daily.    [provider]  ibuprofen (ADVIL,MOTRIN) 200 MG tablet Take 200-800 mg by mouth every 8 (eight) hours as needed (for pain.).    [provider]  LANCETS  ULTRA THIN 30G MISC by Does not apply route. One Touch    [provider]  Multiple Vitamin (MULTIVITAMIN WITH MINERALS) TABS tablet Take 1 tablet by mouth daily.    [provider]  Multiple Vitamins-Minerals (AIRBORNE) TBEF Take 1 tablet by mouth daily as needed (for immune system support). Immune Health/Support    [provider]  nystatin cream (MYCOSTATIN) Apply 1 application topically 2 (two) times daily as needed for dry skin.    [provider]  oseltamivir (TAMIFLU) 75 MG capsule Take 1 capsule (75 mg total) by mouth daily. 05/10/18   Tower, Wynelle Fanny, MD  predniSONE (DELTASONE) 10 MG tablet Take 1-2 tablets (10-20 mg total) by mouth  daily as needed (for wegener's granulomatosis). 05/17/18   Juanito Doom, MD  repaglinide (PRANDIN) 1 MG tablet Take 1 tablet (1 mg total) by mouth 3 (three) times daily before meals. 05/15/18   Renato Shin, MD  sacubitril-valsartan [facility]) 49-51 MG Take 1 tablet (49/51 mg) by mouth twice daily 08/20/18   Minna Merritts, MD  spironolactone (ALDACTONE) 25 MG tablet Take 1 tablet (25 mg total) by mouth daily. 08/20/18 11/18/18  Minna Merritts, MD  tetrahydrozoline 0.05 % ophthalmic solution Place 1-2 drops into both eyes 3 (three) times daily as needed (for dry/irritated eyes).    [provider]  Triamcinolone Acetonide (NASACORT ALLERGY 24HR NA) Place 2 sprays into the nose at bedtime.     [provider]  nebivolol (BYSTOLIC) 5 MG tablet Take 1 tablet (5 mg total) by mouth 2 (two) times daily. 07/15/11 05/03/18  Abner Greenspan, MD    Physical Exam: Vitals:   08/24/18 1445 08/24/18 1545 08/24/18 1615 08/24/18 1630  BP: 137/79 128/78 (!) 145/81 (!) 154/80  Pulse: 60 (!) 57 61 60  Resp: 18 15 12 15   Temp:      TempSrc:      SpO2: 100% 96% 98% 100%  Weight:      Height:        Constitutional: NAD, calm, comfortable Eyes: PERRL, lids and conjunctivae normal ENMT: Mucous membranes are moist.  Posterior pharynx clear of any exudate or lesions.Normal dentition.  Neck: normal, supple, no masses, no thyromegaly Respiratory: clear to auscultation bilaterally, no wheezing, no crackles. Normal respiratory effort. No accessory muscle use.  Cardiovascular: Regular rate and rhythm, no murmurs / rubs / gallops. No extremity edema. 2+ pedal pulses. No carotid bruits.  Abdomen: no tenderness, no masses palpated. No hepatosplenomegaly. Bowel sounds positive.  Musculoskeletal: no clubbing / cyanosis. No joint deformity upper and lower extremities. Good ROM, no contractures. Normal muscle tone.  Neurologic: CN 2-12 grossly intact. Sensation intact, DTR normal. Strength 5/5 in all 4.  Psychiatric: Normal judgment and insight. Alert and oriented x 3. Normal mood.  SKIN/catheters: no rashes, lesions, ulcers. No induration  Labs on Admission: I have personally reviewed following labs and imaging studies  CBC: Recent Labs  Lab 08/24/18 1528  WBC 5.8  NEUTROABS 4.5  HGB 12.2*  HCT 36.2*  MCV 94.3  PLT 829   Basic Metabolic Panel: Recent Labs  Lab 08/24/18 1528  NA 138  K 4.7  CL 106  CO2 22  GLUCOSE 221*  BUN 12  CREATININE 0.99  CALCIUM 8.9   GFR: Estimated Creatinine Clearance: 76.8 mL/min (by C-G formula based on SCr of 0.99 mg/dL). Liver Function Tests: No results for input(s): AST, ALT, ALKPHOS, BILITOT, PROT, ALBUMIN in the last 168 hours. No results for input(s): LIPASE, AMYLASE in the last 168 hours. No results for input(s): AMMONIA in the last 168 hours. Coagulation Profile: No results for input(s): INR, PROTIME in the last 168 hours. Cardiac Enzymes: No results for input(s): CKTOTAL, CKMB, CKMBINDEX, TROPONINI in the last 168 hours. BNP (last 3 results) No results for input(s): PROBNP in the last 8760 hours. HbA1C: No results for input(s): HGBA1C in the last 72 hours. CBG: No results for input(s): GLUCAP in the last 168 hours. Lipid Profile: No results for  input(s): CHOL, HDL, LDLCALC, TRIG, CHOLHDL, LDLDIRECT in the last 72 hours. Thyroid Function Tests: Recent Labs    08/24/18 1528  TSH 1.115   Anemia Panel: No results for input(s): VITAMINB12, FOLATE, FERRITIN,  TIBC, IRON, RETICCTPCT in the last 72 hours. Urine analysis:    Component Value Date/Time   COLORURINE YELLOW 08/24/2018 1630   APPEARANCEUR CLEAR 08/24/2018 1630   LABSPEC 1.012 08/24/2018 1630   PHURINE 5.0 08/24/2018 1630   GLUCOSEU 50 (A) 08/24/2018 1630   GLUCOSEU NEGATIVE 12/26/2012 0909   HGBUR NEGATIVE 08/24/2018 1630   BILIRUBINUR NEGATIVE 08/24/2018 1630   KETONESUR NEGATIVE 08/24/2018 1630   PROTEINUR NEGATIVE 08/24/2018 1630   UROBILINOGEN 0.2 12/26/2012 0909   NITRITE NEGATIVE 08/24/2018 1630   LEUKOCYTESUR NEGATIVE 08/24/2018 1630    Radiological Exams on Admission: No results found.  EKG: Independently reviewed.  Normal sinus rhythm at 63.  No acute ST-T changes     Assessment and Plan:   1.  Near syncope: Cardiac versus vagal.  Recheck orthostatic blood pressures. Could have been vagal given prodrome of nausea and sweating.  Doubt cardiac etiology as patient asymptomatic currently with heart rate in the 50s on bedside monitor.  Last echo in April 2019.  Will defer echo at this time as no complaints of chest pain or leg swellings and staff shortage in coronavirus pandemic.  Can follow-up primary cardiologist Dr. Rockey Situ upon discharge.  2.  Sinus bradycardia: Unlikely to be contributing to problem #1.  Asymptomatic currently with heart rate in 50s.  TSH within normal limits.  Patient on beta-blockers.  Continue to monitor for arrhythmia/pauses  3.  CAD, ischemic cardiomyopathy, chronic systolic CHF: Appears to be compensated.  Resume aspirin, Plavix, Coreg, Entresto and other home medications.  4.  Wegener's granulomatosis: Patient states he takes prednisone? PRN based on his clinical symptoms (apparently uses when he has ear fullness/joint pains or  skin lesions).    5.  Diabetes mellitus: Resume oral hypoglycemics.  Watch for hypo-/hyperglycemia.  Sliding scale insulin.  6.  Hyperlipidemia: Resume home medications    DVT prophylaxis: Lovenox  Code Status: Full code  Family Communication: Discussed with patient. Health care proxy would be his wife Patsy Consults called: ED physician discussed with cardiology Admission status:  Patient admitted as observation as anticipated LOS less than 2 midnights    Guilford Shi MD [facility] Pager [phone]  If 7PM-7AM, please contact night-coverage [email or web address] Password [facility]  08/24/2018, 6:00 PM

## 2018-08-24 NOTE — ED Notes (Signed)
ED Provider at bedside. 

## 2018-08-24 NOTE — ED Notes (Signed)
ED TO INPATIENT HANDOFF REPORT  ED Nurse Name and Phone #: Percell Locus, RN   S Name/Age/Gender Ryan Morgan 78 y.o. male Room/Bed: 046C/046C  Code Status   Code Status: Full Code  Home/SNF/Other Home Patient oriented to: self, place, time and situation Is this baseline? Yes   Triage Complete: Triage complete  Chief Complaint syncope  Triage Note Pt arrives with [facility] from home c/o near syncope episode, with  Generalized weakness and diaphoresis. Hx of STEMI, stents, HTN, DM; Pt reports he was going to do work outside and "felt like he had no energy" and became lightheaded. A&o x4    CBG 134   Allergies Allergies  Allergen Reactions  . Metformin And Related Itching  . Benicar Hct [Olmesartan Medoxomil-Hctz]     Painful and itchy knots on palms of hands  . Bystolic [Nebivolol Hcl]     headache  . Dapsone Hives  . Lovaza [Omega-3-Acid Ethyl Esters] Other (See Comments)    Unsure of reaction type  . Niaspan [Niacin Er] Other (See Comments)    flushing  . Septra [Bactrim] Hives    Level of Care/Admitting Diagnosis ED Disposition    ED Disposition Condition Comment   Admit  Hospital Area: [facility] [[id number]]  Level of Care: Telemetry Cardiac [103]  I expect the patient will be discharged within 24 hours: Yes  LOW acuity---Tx typically complete <24 hrs---ACUTE conditions typically can be evaluated <24 hours---LABS likely to return to acceptable levels <24 hours---IS near functional baseline---EXPECTED to return to current living arrangement---NOT newly hypoxic: Meets criteria for 5C-Observation unit  Diagnosis: Weakness generalized [416606]  Admitting Physician: Guilford Shi [[id number]]  Attending Physician: Guilford Shi [[id number]]  PT Class (Do Not Modify): Observation [104]  PT Acc Code (Do Not Modify): Observation [10022]       B Medical/Surgery History Past Medical History:  Diagnosis Date  . Coronary artery disease    a.  prior LAD stenting 2000; b. [place] 2003 LAD 40, D1 50; c. Lexiscan 02/2017: large defect of severe severity in the basal inferoseptal, basal inferior, mid inferoseptal, mid inferior and apical inferior location, high risk; d. LHC 03/29/17: LM nl, patent LAD stent w/ 40% ISR, D1 100,  OM1 90, RPDA 100 w/ L-R collats, post atrio 80% s/p PCI/DES    . Diabetes mellitus   . Hyperlipidemia   . Hypertension   . Ischemic cardiomyopathy    a. TTE 02/2017: EF 30-35%, diffuse HK, normal LV diastolic function parameters, mild AI/MR, RV systolic function normal, PASP normal  . Lung nodule    a. s/p prior thoracotomy  . Morbid obesity ([place])   . Wegener's granulomatosis ([place]) 2007   Past Surgical History:  Procedure Laterality Date  . BASAL CELL CARCINOMA EXCISION     removed from face x 3  . CARDIAC CATHETERIZATION    . CARDIAC SURGERY  2000   stent in heart  . CORONARY ANGIOPLASTY  09/22/1998   s/p stent placement; Tristar 3.0 x 18 mm Ref [id number]  . CORONARY STENT INTERVENTION N/A 03/29/2017   Procedure: CORONARY STENT INTERVENTION;  Surgeon: Wellington Hampshire, MD;  Location: [facility] [place] [facility];  Service: Cardiovascular;  Laterality: N/A;  . LEFT HEART CATH AND CORONARY ANGIOGRAPHY N/A 03/29/2017   Procedure: LEFT HEART CATH AND CORONARY ANGIOGRAPHY;  Surgeon: Wellington Hampshire, MD;  Location: [place] [facility];  Service: Cardiovascular;  Laterality: N/A;  . 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  ULTRASOUND GUIDANCE FOR VASCULAR ACCESS  03/29/2017   Procedure: Ultrasound Guidance For Vascular Access;  Surgeon: Wellington Hampshire, MD;  Location: [place] [facility];  Service: Cardiovascular;;     A IV Location/Drains/Wounds Patient Lines/Drains/Airways Status   Active Line/Drains/Airways    Name:   Placement date:   Placement time:   Site:   Days:   Peripheral IV 08/24/18 Left Antecubital   08/24/18    1430    Antecubital   less than 1          Intake/Output Last 24  hours  Intake/Output Summary (Last 24 hours) at 08/24/2018 1838 Last data filed at 08/24/2018 1658 Gross per 24 hour  Intake -  Output 500 ml  Net -500 ml    Labs/Imaging Results for orders placed or performed during the hospital encounter of 08/24/18 (from the past 48 hour(s))  CBC with Differential     Status: Abnormal   Collection Time: 08/24/18  3:28 PM  Result Value Ref Range   WBC 5.8 4.0 - 10.5 K/uL   RBC 3.84 (L) 4.22 - 5.81 MIL/uL   Hemoglobin 12.2 (L) 13.0 - 17.0 g/dL   HCT 36.2 (L) 39.0 - 52.0 %   MCV 94.3 80.0 - 100.0 fL   MCH 31.8 26.0 - 34.0 pg   MCHC 33.7 30.0 - 36.0 g/dL   RDW 13.1 11.5 - 15.5 %   Platelets 157 150 - 400 K/uL   nRBC 0.0 0.0 - 0.2 %   Neutrophils Relative % 76 %   Neutro Abs 4.5 1.7 - 7.7 K/uL   Lymphocytes Relative 15 %   Lymphs Abs 0.9 0.7 - 4.0 K/uL   Monocytes Relative 7 %   Monocytes Absolute 0.4 0.1 - 1.0 K/uL   Eosinophils Relative 1 %   Eosinophils Absolute 0.1 0.0 - 0.5 K/uL   Basophils Relative 1 %   Basophils Absolute 0.0 0.0 - 0.1 K/uL   Immature Granulocytes 0 %   Abs Immature Granulocytes 0.02 0.00 - 0.07 K/uL    Comment: Performed at [facility], [address], [place], [place] [address]  Basic metabolic panel     Status: Abnormal   Collection Time: 08/24/18  3:28 PM  Result Value Ref Range   Sodium 138 135 - 145 mmol/L   Potassium 4.7 3.5 - 5.1 mmol/L   Chloride 106 98 - 111 mmol/L   CO2 22 22 - 32 mmol/L   Glucose, Bld 221 (H) 70 - 99 mg/dL   BUN 12 8 - 23 mg/dL   Creatinine, Ser 0.99 0.61 - 1.24 mg/dL   Calcium 8.9 8.9 - 10.3 mg/dL   GFR calc non Af Amer >60 >60 mL/min   GFR calc Af Amer >60 >60 mL/min   Anion gap 10 5 - 15    Comment: Performed at [facility], [place] [address], [place], [place] [address]  TSH     Status: None   Collection Time: 08/24/18  3:28 PM  Result Value Ref Range   TSH 1.115 0.350 - 4.500 uIU/mL    Comment: Performed by a 3rd Generation assay with a functional sensitivity of  <=0.01 uIU/mL. Performed at [facility], [place] [address], [place], [place] [address]   I-stat troponin, ED     Status: None   Collection Time: 08/24/18  3:40 PM  Result Value Ref Range   Troponin i, poc 0.00 0.00 - 0.08 ng/mL   Comment 3  Comment: Due to the release kinetics of cTnI, a negative result within the first hours of the onset of symptoms does not rule out myocardial infarction with certainty. If myocardial infarction is still suspected, repeat the test at appropriate intervals.   Urinalysis, Routine w reflex microscopic     Status: Abnormal   Collection Time: 08/24/18  4:30 PM  Result Value Ref Range   Color, Urine YELLOW YELLOW   APPearance CLEAR CLEAR   Specific Gravity, Urine 1.012 1.005 - 1.030   pH 5.0 5.0 - 8.0   Glucose, UA 50 (A) NEGATIVE mg/dL   Hgb urine dipstick NEGATIVE NEGATIVE   Bilirubin Urine NEGATIVE NEGATIVE   Ketones, ur NEGATIVE NEGATIVE mg/dL   Protein, ur NEGATIVE NEGATIVE mg/dL   Nitrite NEGATIVE NEGATIVE   Leukocytes,Ua NEGATIVE NEGATIVE    Comment: Performed at [facility] [address], [place], [place] [address]   No results found.  Pending Labs Unresulted Labs (From admission, onward)    Start     Ordered   08/31/18 0500  Creatinine, serum  (enoxaparin (LOVENOX)    CrCl >/= 30 ml/min)  Weekly,   R    Comments:  while on enoxaparin therapy    08/24/18 1815   08/24/18 1815  Troponin I - Now Then Q6H  Now then every 6 hours,   R     08/24/18 1815   08/24/18 1809  CBC  (enoxaparin (LOVENOX)    CrCl >/= 30 ml/min)  Once,   R    Comments:  Baseline for enoxaparin therapy IF NOT ALREADY DRAWN.  Notify MD if PLT < 100 K.    08/24/18 1815   08/24/18 1809  Creatinine, serum  (enoxaparin (LOVENOX)    CrCl >/= 30 ml/min)  Once,   R    Comments:  Baseline for enoxaparin therapy IF NOT ALREADY DRAWN.    08/24/18 1815          Vitals/Pain Today's Vitals   08/24/18 1545 08/24/18 1557 08/24/18 1615 08/24/18 1630   BP: 128/78  (!) 145/81 (!) 154/80  Pulse: (!) 57  61 60  Resp: 15  12 15   Temp:      TempSrc:      SpO2: 96%  98% 100%  Weight:      Height:      PainSc:  0-No pain      Isolation Precautions No active isolations  Medications Medications  aspirin EC tablet 81 mg (has no administration in time range)  hydroxychloroquine (PLAQUENIL) tablet 200 mg (has no administration in time range)  atorvastatin (LIPITOR) tablet 20 mg (has no administration in time range)  carvedilol (COREG) tablet 6.25 mg (has no administration in time range)  ezetimibe (ZETIA) tablet 10 mg (has no administration in time range)  sacubitril-valsartan (ENTRESTO) 49-51 mg per tablet (has no administration in time range)  spironolactone (ALDACTONE) tablet 25 mg (has no administration in time range)  predniSONE (DELTASONE) tablet 10 mg (has no administration in time range)  repaglinide (PRANDIN) tablet 1 mg (has no administration in time range)  clopidogrel (PLAVIX) tablet 75 mg (has no administration in time range)  multivitamin with minerals tablet 1 tablet (has no administration in time range)  loratadine (CLARITIN) tablet 10 mg (has no administration in time range)  enoxaparin (LOVENOX) injection 40 mg (has no administration in time range)  acetaminophen (TYLENOL) tablet 650 mg (has no administration in time range)    Or  acetaminophen (TYLENOL) suppository 650 mg (has no  administration in time range)  bisacodyl (DULCOLAX) EC tablet 5 mg (has no administration in time range)  ondansetron (ZOFRAN) tablet 4 mg (has no administration in time range)    Or  ondansetron (ZOFRAN) injection 4 mg (has no administration in time range)  sodium chloride 0.9 % bolus 1,000 mL (0 mLs Intravenous Stopped 08/24/18 1815)    Mobility walks Low fall risk   Focused Assessments Neuro Assessment Handoff:  Swallow screen pass? Yes  Cardiac Rhythm: Sinus bradycardia       Neuro Assessment: Within Defined Limits Neuro  Checks:      Last Documented NIHSS Modified Score:   Has TPA been given? No If patient is a Neuro Trauma and patient is going to OR before floor call report to [place] nurse: [phone] or [phone]     R Recommendations: See Admitting Provider Note  Report given to:   Additional Notes:

## 2018-08-24 NOTE — ED Provider Notes (Signed)
[facility] EMERGENCY DEPARTMENT Provider Note   CSN: [id number] Arrival date & time: 08/24/18  1429    History   Chief Complaint Chief Complaint  Patient presents with   Near Syncope   Weakness    HPI    Ryan Morgan is a 78 y.o. male with a PMHx of BPPV, CAD, DM2, HTN, HLD, ischemic cardiomyopathy, wegener's granulomatosis, and other conditions listed below, who presents to the ED with complaints of generalized weakness/fatigue that occurred around 45 minutes ago but now has resolved.  Patient states that he had been outside for about an hour moving boxes, he had gotten a chair to sit down with his wife when he started not feeling well, he got up to go inside and felt all of a sudden very weak/fatigued so he decided to sit down and called 911.  He states that sitting down seem to help it.  He took aspirin which did not seem to change because he was already feeling better.  Nothing aggravated his weakness.  He states he feels completely fine now.  He denies feeling lightheaded or dizzy, just felt overwhelmingly fatigued; however he later states that he felt so weak that he felt like he was going to "go out".  He states that he was sweating already but he had some diaphoresis during this episode, as well as some nausea, but both have resolved as well.  He reports that he only had 2 biscotti's and a cup of coffee this morning which is his usual breakfast.  He has not drank much water today.  He states he feels completely better now.  He denies any recent changes in medications.  His PCP is Dr. Glori Bickers and his cardiologist is Dr. Rockey Situ.  He denies any recent fevers or chills, cough, URI symptoms, chest pain, shortness breath, leg swelling, abdominal pain, vomiting, diarrhea, constipation, melena, hematochezia, dysuria, hematuria, myalgias, arthralgias, numbness, tingling, focal weakness, headache, vision changes, or any other complaints at this time.  The history is provided by  the patient and medical records. No language interpreter was used.    Past Medical History:  Diagnosis Date   Coronary artery disease    a. prior LAD stenting 2000; b. [place] 2003 LAD 40, D1 50; c. Lexiscan 02/2017: large defect of severe severity in the basal inferoseptal, basal inferior, mid inferoseptal, mid inferior and apical inferior location, high risk; d. LHC 03/29/17: LM nl, patent LAD stent w/ 40% ISR, D1 100,  OM1 90, RPDA 100 w/ L-R collats, post atrio 80% s/p PCI/DES     Diabetes mellitus    Hyperlipidemia    Hypertension    Ischemic cardiomyopathy    a. TTE 02/2017: EF 30-35%, diffuse HK, normal LV diastolic function parameters, mild AI/MR, RV systolic function normal, PASP normal   Lung nodule    a. 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    Patient Active Problem List   Diagnosis Date Noted   Hip joint effusion, left 05/03/2018   Laceration of arm 12/04/2017   Current chronic use of systemic steroids 11/15/2016   Routine general medical examination at a health care facility 11/09/2015   Hearing loss 11/09/2015   History of colonic polyps 11/09/2015   Rash of hands 03/10/2015   Colon cancer screening 06/09/2014   Blepharitis, bilateral 06/02/2014   Shortness of breath 03/10/2014   Benign paroxysmal positional vertigo 12/14/2013   Bursitis of right hip 10/17/2013  Coronary artery disease of native artery of native heart with stable angina pectoris ([place]) 03/19/2013   Encounter for Medicare annual wellness exam 03/05/2013   Adverse effect of glucocorticoid or synthetic analogue 03/05/2013   Osteopenia 03/05/2013   Obesity 07/15/2011   Prostate cancer screening 07/10/2011   Cough 12/23/2010   Hypertension 08/30/2010   Hyperlipemia 08/30/2010   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   Wegener's granulomatosis ([place]) 08/30/2010    Past Surgical History:  Procedure  Laterality Date   BASAL CELL CARCINOMA EXCISION     removed from face x 3   [place]   stent in heart   CORONARY ANGIOPLASTY  09/22/1998   s/p stent placement; [facility] 3.0 x 18 mm Ref [id number]   CORONARY STENT INTERVENTION N/A 03/29/2017   Procedure: CORONARY STENT INTERVENTION;  Surgeon: Wellington Hampshire, MD;  Location: [facility];  Service: Cardiovascular;  Laterality: N/A;   LEFT HEART CATH AND CORONARY ANGIOGRAPHY N/A 03/29/2017   Procedure: LEFT HEART CATH AND CORONARY ANGIOGRAPHY;  Surgeon: Wellington Hampshire, MD;  Location: [facility];  Service: Cardiovascular;  Laterality: N/A;   LUNG SURGERY     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   ULTRASOUND GUIDANCE FOR VASCULAR ACCESS  03/29/2017   Procedure: Ultrasound Guidance For Vascular Access;  Surgeon: Wellington Hampshire, MD;  Location: [facility];  Service: Cardiovascular;;        Home Medications    Prior to Admission medications   Medication Sig Start Date End Date Taking? Authorizing Provider  aspirin EC 81 MG tablet Take 81 mg by mouth daily.    [provider]  atorvastatin (LIPITOR) 20 MG tablet Take 1 tablet (20 mg total) by mouth daily. 02/02/18   Tower, Wynelle Fanny, MD  carvedilol (COREG) 6.25 MG tablet Take 1 tablet by mouth twice daily 08/17/18   Minna Merritts, MD  cetirizine (ZYRTEC) 10 MG tablet Take 20 mg by mouth daily.    [provider]  clobetasol cream (TEMOVATE) 4.25 % Apply 1 application topically 2 (two) times daily. To affected areas 04/19/17   Ria Bush, MD  clopidogrel (PLAVIX) 75 MG tablet Take 1 tablet by mouth once daily 08/17/18   Minna Merritts, MD  diphenhydrAMINE (BENADRYL) 25 MG tablet Take 25 mg by mouth every 6 (six) hours as needed.    [provider]  ezetimibe (ZETIA) 10 MG tablet Take 1 tablet (10 mg total) by mouth daily. 08/20/18   Minna Merritts, MD  fexofenadine (ALLEGRA) 180  MG tablet Take 180 mg by mouth daily.    [provider]  glucose blood (ONE TOUCH ULTRA TEST) test strip USE 1 STRIP TO CHECK GLUCOSE ONCE DAILY AS DIRECTED 08/17/18   Renato Shin, MD  hydroxychloroquine (PLAQUENIL) 200 MG tablet Take 200 mg by mouth 2 (two) times daily.    [provider]  ibuprofen (ADVIL,MOTRIN) 200 MG tablet Take 200-800 mg by mouth every 8 (eight) hours as needed (for pain.).    [provider]  LANCETS ULTRA THIN 30G MISC by Does not apply route. One Touch    [provider]  Multiple Vitamin (MULTIVITAMIN WITH MINERALS) TABS tablet Take 1 tablet by mouth daily.    [provider]  Multiple Vitamins-Minerals (AIRBORNE) TBEF Take 1 tablet by mouth daily as needed (for immune system support). Immune Health/Support    [provider]  nystatin cream (  MYCOSTATIN) Apply 1 application topically 2 (two) times daily as needed for dry skin.    [provider]  oseltamivir (TAMIFLU) 75 MG capsule Take 1 capsule (75 mg total) by mouth daily. 05/10/18   Tower, Wynelle Fanny, MD  predniSONE (DELTASONE) 10 MG tablet Take 1-2 tablets (10-20 mg total) by mouth daily as needed (for wegener's granulomatosis). 05/17/18   Juanito Doom, MD  repaglinide (PRANDIN) 1 MG tablet Take 1 tablet (1 mg total) by mouth 3 (three) times daily before meals. 05/15/18   Renato Shin, MD  sacubitril-valsartan [facility]) 49-51 MG Take 1 tablet (49/51 mg) by mouth twice daily 08/20/18   Minna Merritts, MD  spironolactone (ALDACTONE) 25 MG tablet Take 1 tablet (25 mg total) by mouth daily. 08/20/18 11/18/18  Minna Merritts, MD  tetrahydrozoline 0.05 % ophthalmic solution Place 1-2 drops into both eyes 3 (three) times daily as needed (for dry/irritated eyes).    [provider]  Triamcinolone Acetonide (NASACORT ALLERGY 24HR NA) Place 2 sprays into the nose at bedtime.     [provider]  nebivolol (BYSTOLIC) 5 MG tablet Take 1  tablet (5 mg total) by mouth 2 (two) times daily. 07/15/11 05/03/18  Tower, Wynelle Fanny, MD    Family History Family History  Problem Relation Age of Onset   Cancer Mother        tumor on tonsil   Stroke Maternal Grandfather    Diabetes Neg Hx     Social History Social History   Tobacco Use   Smoking status: Former Smoker    Packs/day: 0.50    Years: 20.00    Pack years: 10.00    Types: Cigarettes    Last attempt to quit: 05/30/1978    Years since quitting: 40.2   Smokeless tobacco: Never Used  Substance Use Topics   Alcohol use: No    Alcohol/week: 0.0 standard drinks   Drug use: No     Allergies   Metformin and related; Benicar hct [olmesartan medoxomil-hctz]; Bystolic [nebivolol hcl]; Dapsone; Lovaza [omega-3-acid ethyl esters]; Niaspan [niacin er]; and Septra [bactrim]   Review of Systems Review of Systems  Constitutional: Positive for diaphoresis (sweating) and fatigue. Negative for chills and fever.  HENT: Negative for rhinorrhea and sore throat.   Eyes: Negative for visual disturbance.  Respiratory: Negative for cough and shortness of breath.   Cardiovascular: Negative for chest pain and leg swelling.  Gastrointestinal: Positive for nausea. Negative for abdominal pain, blood in stool, constipation, diarrhea and vomiting.  Genitourinary: Negative for dysuria and hematuria.  Musculoskeletal: Negative for arthralgias and myalgias.  Skin: Negative for color change.  Allergic/Immunologic: Positive for immunocompromised state (DM2).  Neurological: Positive for weakness (generalized, no focal weakness). Negative for syncope, light-headedness, numbness and headaches.  Psychiatric/Behavioral: Negative for confusion.   All other systems reviewed and are negative for acute change except as noted in the HPI.    Physical Exam Updated Vital Signs BP (!) 141/77    Pulse (!) 58    Temp 98 F (36.7 C) (Oral)    Resp 14    Ht 5\' 10"  (1.778 m)    Wt 104.3 kg    SpO2 98%     BMI 33.00 kg/m   Physical Exam Vitals signs and nursing note reviewed.  Constitutional:      General: He is not in acute distress.    Appearance: Normal appearance. He is well-developed. He is not toxic-appearing.     Comments: Afebrile, nontoxic, NAD  HENT:     Head: Normocephalic and atraumatic.  Eyes:     General: Vision grossly intact.        Right eye: No discharge.        Left eye: No discharge.     Extraocular Movements: Extraocular movements intact.     Conjunctiva/sclera: Conjunctivae normal.     Pupils: Pupils are equal, round, and reactive to light.     Comments: PERRL, EOMI, no nystagmus  Neck:     Musculoskeletal: Normal range of motion and neck supple. Normal range of motion. No neck rigidity, spinous process tenderness or muscular tenderness.     Comments: FROM intact without spinous process TTP, no bony stepoffs or deformities, no paraspinous muscle TTP or muscle spasms. No rigidity or meningeal signs. No bruising or swelling.  Cardiovascular:     Rate and Rhythm: Normal rate and regular rhythm.     Pulses: Normal pulses.     Heart sounds: Normal heart sounds, S1 normal and S2 normal. No murmur. No friction rub. No gallop.      Comments: HR 60s at rest during exam, goes up to ~85 when standing/walking. Reg rhythm, nl s1/s2, no m/r/g, no pedal edema, distal pulses intact Pulmonary:     Effort: Pulmonary effort is normal. No respiratory distress.     Breath sounds: Normal breath sounds. No decreased breath sounds, wheezing, rhonchi or rales.  Abdominal:     General: Bowel sounds are normal. There is no distension.     Palpations: Abdomen is soft. Abdomen is not rigid.     Tenderness: There is no abdominal tenderness. There is no right CVA tenderness, left CVA tenderness, guarding or rebound. Negative signs include Murphy's sign and McBurney's sign.  Musculoskeletal: Normal range of motion.     Comments: MAE x4 Strength and sensation grossly intact in all  extremities Distal pulses intact Gait steady  Skin:    General: Skin is warm and dry.     Findings: No rash.  Neurological:     General: No focal deficit present.     Mental Status: He is alert and oriented to person, place, and time.     GCS: GCS eye subscore is 4. GCS verbal subscore is 5. GCS motor subscore is 6.     Cranial Nerves: Cranial nerves are intact. No cranial nerve deficit.     Sensory: Sensation is intact. No sensory deficit.     Motor: Motor function is intact.     Coordination: Coordination is intact. Coordination normal.     Gait: Gait normal.     Comments: CN 2-12 grossly intact A&O x4 GCS 15 Sensation and strength intact Gait nonataxic including with tandem walking Coordination with finger-to-nose WNL Neg pronator drift   Psychiatric:        Mood and Affect: Mood and affect normal.        Behavior: Behavior normal.    ORTHOSTATICS: Orthostatic VS for the past 24 hrs:  BP- Lying Pulse- Lying BP- Sitting Pulse- Sitting BP- Standing at 0 minutes Pulse- Standing at 0 minutes  08/24/18 1603 143/83 63 137/70 65 141/79 62      ED Treatments / Results  Labs (all labs ordered are listed, but only abnormal results are displayed) Labs Reviewed  CBC WITH DIFFERENTIAL/PLATELET - Abnormal; Notable for the following components:      Result Value   RBC 3.84 (*)    Hemoglobin 12.2 (*)    HCT 36.2 (*)    All other  components within normal limits  BASIC METABOLIC PANEL - Abnormal; Notable for the following components:   Glucose, Bld 221 (*)    All other components within normal limits  URINALYSIS, ROUTINE W REFLEX MICROSCOPIC - Abnormal; Notable for the following components:   Glucose, UA 50 (*)    All other components within normal limits  TSH  I-STAT TROPONIN, ED    EKG EKG Interpretation  Date/Time:  Friday August 24 2018 15:45:43 EDT Ventricular Rate:  63 PR Interval:    QRS Duration: 94 QT Interval:  428 QTC Calculation: 439 R Axis:     Text  Interpretation:  Sinus rhythm Inferior infarct, old Consider anterior infarct normal. no sig change from previous Confirmed by Charlesetta Shanks [id number]) on 08/24/2018 3:49:28 PM   Radiology No results found.  Procedures Procedures (including critical care time)  Medications Ordered in ED Medications  sodium chloride 0.9 % bolus 1,000 mL (1,000 mLs Intravenous New Bag/Given 08/24/18 1556)     Initial Impression / Assessment and Plan / ED Course  I have reviewed the triage vital signs and the nursing notes.  Pertinent labs & imaging results that were available during my care of the patient were reviewed by me and considered in my medical decision making (see chart for details).        78 y.o. male here with generalized weakness/fatigue that occurred about 45 minutes ago but has since resolved.  Patient was outside moving some boxes, he had gotten a chair to sit down and started not feeling well, went inside and felt weak so he sat down.  He now feels better.  He states that he was sweating from being outside, but also felt nauseous, which is also resolved.  He denies feeling dizzy or lightheaded but he later describes feeling like he was going to "go out".  Denies any chest pain or shortness of breath.  On exam, heart rate 60s at rest but goes up to 85 when he stands up, afebrile and nontoxic, BP stable, well-appearing, no focal neuro deficits, clear lung exam, no abdominal tenderness.  No pedal edema.  Unclear etiology, could be relative dehydration/poor PO intake from not eating or drinking well today before going outside vs anginal equivalent, will get labs, EKG, orthostatics, and give bolus, and reassess shortly. Doubt need for any head or chest imaging at this time. Discussed case with my attending Dr. Johnney Killian who agrees with plan.   4:22 PM CBC w/diff with chronic stable anemia. BMP with gluc 221 but otherwise WNL. Trop neg. EKG without acute ischemic findings. Orthostatics unremarkable.  TSH pending, U/A not yet done. Pt feeling well. Concern for anginal equivalent for his symptoms, will admit for r/o. Discussed case with my attending Dr. Johnney Killian who agrees with plan.   4:45 PM TSH WNL. Spoke with Dr. Earnest Conroy of [facility] who would like [place] to consult cardiology to see if they agree with him needing admission. Will consult them.   5:08 PM Spoke with Dr. Stanford Breed who states that it's possible this was vasovagal although if we're concerned about the pt, to admit to hospitalist for rule out and they can consult him further if they need additional assistance. Will repage hospitalist. Of note, U/A unremarkable. Will await return call from hospitalist.   5:17 PM Dr. Earnest Conroy of [facility] returning page and will admit. Holding orders to be placed by admitting team. Please see their notes for further documentation of care. I appreciate their help with this pleasant pt's care. Pt stable  at time of admission.    Final Clinical Impressions(s) / ED Diagnoses   Final diagnoses:  Near syncope  Generalized weakness  Chronic anemia    ED Discharge Orders    [address], [place], [place] 08/24/18 1717    Charlesetta Shanks, MD 08/26/18 [phone]

## 2018-08-24 NOTE — ED Triage Notes (Signed)
Pt arrives with [facility] from home c/o near syncope episode, with  Generalized weakness and diaphoresis. Hx of STEMI, stents, HTN, DM; Pt reports he was going to do work outside and "felt like he had no energy" and became lightheaded. A&o x4    CBG 134

## 2018-08-24 NOTE — Telephone Encounter (Signed)
Attempted to schedule.  No ans no vm  

## 2018-08-24 NOTE — ED Provider Notes (Signed)
Medical screening examination/treatment/procedure(s) were conducted as a shared visit with non-physician practitioner(s) and myself.  I personally evaluated the patient during the encounter.  EKG Interpretation  Date/Time:  Friday August 24 2018 15:45:43 EDT Ventricular Rate:  63 PR Interval:    QRS Duration: 94 QT Interval:  428 QTC Calculation: 439 R Axis:     Text Interpretation:  Sinus rhythm Inferior infarct, old Consider anterior infarct normal. no sig change from previous Confirmed by Charlesetta Shanks [phone]) on 08/24/2018 3:49:28 PM Dictated complete addendum on PA-C chart   Charlesetta Shanks, MD 08/26/18 [phone]

## 2018-08-25 DIAGNOSIS — R531 Weakness: Secondary | ICD-10-CM | POA: Diagnosis not present

## 2018-08-25 DIAGNOSIS — M3130 Wegener's granulomatosis without renal involvement: Secondary | ICD-10-CM | POA: Diagnosis not present

## 2018-08-25 DIAGNOSIS — D649 Anemia, unspecified: Secondary | ICD-10-CM

## 2018-08-25 DIAGNOSIS — R001 Bradycardia, unspecified: Secondary | ICD-10-CM

## 2018-08-25 DIAGNOSIS — I25118 Atherosclerotic heart disease of native coronary artery with other forms of angina pectoris: Secondary | ICD-10-CM | POA: Diagnosis not present

## 2018-08-25 DIAGNOSIS — E11311 Type 2 diabetes mellitus with unspecified diabetic retinopathy with macular edema: Secondary | ICD-10-CM | POA: Diagnosis not present

## 2018-08-25 DIAGNOSIS — R55 Syncope and collapse: Secondary | ICD-10-CM | POA: Diagnosis present

## 2018-08-25 LAB — GLUCOSE, CAPILLARY
GLUCOSE-CAPILLARY: 69 mg/dL — AB (ref 70–99)
Glucose-Capillary: 98 mg/dL (ref 70–99)

## 2018-08-25 LAB — TROPONIN I
Troponin I: <0.03 ng/mL (ref <0.03)
Troponin I: <0.03 ng/mL (ref <0.03)

## 2018-08-25 NOTE — Progress Notes (Signed)
Patient will be discharge to home. Patient has had no c/o discomfort this shift.  All discharge instructions reviewed with patient.  All personal belongings with patient.

## 2018-08-25 NOTE — Discharge Summary (Signed)
Physician Discharge Summary  [name] WPY:[id number] DOB: Aug 19, 1940 DOA: 08/24/2018  PCP: Abner Greenspan, MD  Admit date: 08/24/2018 Discharge date: 08/25/2018  Time spent: 45 minutes  Recommendations for Outpatient Follow-up:  1. Follow up with PCP 1-2 weeks for evaluation of symptoms 2. 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 male with history h/o CAD status post PTCA, ischemic cardiomyopathy with EF of 35%, hyperlipidemia, hypertension, diabetes mellitus, Wegener's granulomatosis presented on 08/24/18 with complaints of near syncope.  Patient stated he was in his usual state of health until about 2 PM when he was talking to his neighbors in the yard.  He stated he carried a chair for about 15 feet (did not feel exerted) to sit down and talk.  He stated he suddenly felt "sick and drained out".  He described  feeling nauseous, sweaty and lightheaded like he might pass out.  He reported feeling near syncopal while he walked back into his living room and sat down on the couch before calling family members to help.  He denied any chest pain per se.  He denied any leg swellings or recent symptoms of exertional dyspnea.  His blood glucose was apparently 130 upon EMS check. Work-up in the ED essentially  pain unremarkable with normal vital signs except for mild sinus bradycardia and normal labs except for blood glucose of 220.  Given his significant cardiac history, ED physician  reluctant to send patient home and discussed case with on-call cardiologist Dr. Stanford Breed who felt admitting overnight for serial cardiac enzymes was reasonable. Hospitalist service requested to admit patient for overnight observation.   Hospital Course:  1.  Near syncope: Cardiac versus vagal. Orthostatic blood upon presentation but not at discharge. Could have been vagal given prodrome of nausea and sweating in setting of dehydration. Tropon negative x3. Will defer echo at this time as no complaints of chest pain or leg swellings and staff shortage in coronavirus pandemic. No s/sx infection. No metabolic derangement. Can follow-up primary cardiologist Dr. Rockey Situ upon discharge.  2.  Sinus bradycardia: Unlikely to be contributing to problem #1.  HR range 55-64.  TSH within normal limits.  Patient on beta-blockers. No events on tele.   3.  CAD, ischemic cardiomyopathy, chronic systolic CHF: remained compensated.  Resume aspirin, Plavix, Coreg, Entresto and other home medications.  4.  Wegener's granulomatosis: Patient stated he takes prednisone? PRN based on his clinical symptoms (apparently uses when he has ear fullness/joint pains or skin lesions).    5.  Diabetes mellitus:  controlled. Resume oral hypoglycemics.  no  hypo-/hyperglycemia.   6.  Hyperlipidemia: Resume home medications   Procedures:  Consultations:    Discharge Exam: Vitals:   08/25/18 0748 08/25/18 0838  BP: (!) 156/86 135/74  Pulse: 64   Resp: 18   Temp: 98.4 F (36.9 C)   SpO2: 99%     General: awake alert oriented x3 no acute distress Cardiovascular: rrr no  mgr no LE edema Respiratory: normal effort BS clear bilaterally no wheeze  Discharge Instructions   Discharge Instructions    Ambulatory referral to Cardiology   Complete by:  As directed    Diet - low sodium heart healthy   Complete by:  As directed    Discharge instructions   Complete by:  As directed    Maintain hydration Take meds as  directed Follow up with PCP 1-2 weeks for evaluation of symptoms   Increase activity slowly   Complete by:  As directed      Allergies as of 08/25/2018      Reactions   Metformin And Related Itching   Benicar Hct [olmesartan Medoxomil-hctz] Itching   Painful and itchy knots on palms of hands   Bystolic [nebivolol Hcl] Other (See Comments)   headache   Dapsone Hives   Lovaza [omega-3-acid Ethyl Esters] Other (See Comments)   Unknown reaction   Niaspan [niacin Er] Other (See Comments)   flushing   Septra [bactrim] Hives      Medication List    TAKE these medications   Airborne Tbef Take 1 tablet by mouth daily as needed (for immune system support). Immune Health/Support   aspirin EC 81 MG tablet Take 81 mg by mouth daily.   atorvastatin 20 MG tablet Commonly known as:  LIPITOR Take 1 tablet (20 mg total) by mouth daily.   carvedilol 6.25 MG tablet Commonly known as:  COREG Take 1 tablet by mouth twice daily   cetirizine 10 MG tablet Commonly known as:  ZYRTEC Take 10 mg by mouth 2 (two) times daily.   clobetasol cream 0.05 % Commonly known as:  TEMOVATE Apply 1 application topically 2 (two) times daily. To affected areas What changed:    when to take this  reasons to take this  additional instructions   clopidogrel 75 MG tablet Commonly known as:  PLAVIX Take 1 tablet by mouth once daily   diphenhydrAMINE 25 MG tablet Commonly known as:  BENADRYL Take 25 mg by mouth every 6 (six) hours as needed for allergies.   ezetimibe 10 MG tablet Commonly known as:  ZETIA Take 1 tablet (10 mg total) by mouth daily.   fexofenadine 180 MG tablet Commonly known as:  ALLEGRA Take 180 mg by mouth daily as needed for allergies.   glucose blood test strip Commonly known as:  ONE TOUCH ULTRA TEST USE 1 STRIP TO CHECK GLUCOSE ONCE DAILY AS DIRECTED   ibuprofen 200 MG tablet Commonly known as:  ADVIL,MOTRIN Take 200-400 mg by mouth every 8 (eight) hours as needed  (for pain.).   Lancets Ultra Thin 30G Misc by Does not apply route. One Touch   multivitamin with minerals Tabs tablet Take 1 tablet by mouth daily.   NASACORT ALLERGY 24HR NA Place 2 sprays into the nose at bedtime as needed (allergies).   nystatin cream Commonly known as:  MYCOSTATIN Apply 1 application topically 2 (two) times daily as needed for dry skin.   predniSONE 10 MG tablet Commonly known as:  DELTASONE Take 1-2 tablets (10-20 mg total) by mouth daily as needed (for wegener's granulomatosis).   repaglinide 2 MG tablet Commonly known as:  PRANDIN Take 2 mg by mouth 3 (three) times daily before meals. What changed:  Another medication with the same name was removed. Continue taking this medication, and follow the directions you see here.   sacubitril-valsartan 49-51 MG Commonly known as:  Entresto Take 1 tablet (49/51 mg) by mouth  twice daily   spironolactone 25 MG tablet Commonly known as:  ALDACTONE Take 1 tablet (25 mg total) by mouth daily.   tetrahydrozoline 0.05 % ophthalmic solution Place 1-2 drops into both eyes 3 (three) times daily as needed (for dry/irritated eyes).      Allergies  Allergen Reactions  . Metformin And Related Itching  . Benicar Hct [Olmesartan Medoxomil-Hctz] Itching    Painful and itchy knots on palms of hands  . Bystolic [Nebivolol Hcl] Other (See Comments)    headache  . Dapsone Hives  . Lovaza [Omega-3-Acid Ethyl Esters] Other (See Comments)    Unknown reaction  . Niaspan [Niacin Er] Other (See Comments)    flushing  . Septra [Bactrim] Hives      The results of significant diagnostics from this hospitalization (including imaging, microbiology, ancillary and laboratory) are listed below for reference.    Significant Diagnostic Studies: No results found.  Microbiology: No results found for this or any previous visit (from the past 240 hour(s)).   Labs: Basic Metabolic Panel: Recent Labs  Lab 08/24/18 1528  08/24/18 1927  NA 138  --   K 4.7  --   CL 106  --   CO2 22  --   GLUCOSE 221*  --   BUN 12  --   CREATININE 0.99 0.83  CALCIUM 8.9  --    Liver Function Tests: No results for input(s): AST, ALT, ALKPHOS, BILITOT, PROT, ALBUMIN in the last 168 hours. No results for input(s): LIPASE, AMYLASE in the last 168 hours. No results for input(s): AMMONIA in the last 168 hours. CBC: Recent Labs  Lab 08/24/18 1528 08/24/18 1927  WBC 5.8 4.9  NEUTROABS 4.5  --   HGB 12.2* 12.3*  HCT 36.2* 36.2*  MCV 94.3 92.8  PLT 157 157   Cardiac Enzymes: Recent Labs  Lab 08/24/18 1927 08/25/18 0001 08/25/18 0622  TROPONINI <0.03 <0.03 <0.03   BNP: BNP (last 3 results) No results for input(s): BNP in the last 8760 hours.  ProBNP (last 3 results) No results for input(s): PROBNP in the last 8760 hours.  CBG: Recent Labs  Lab 08/24/18 2045 08/25/18 0614 08/25/18 0640  GLUCAP 129* 69* 98       Signed:  Radene Gunning NP [facility] 08/25/2018, 11:21 AM

## 2018-08-25 NOTE — Progress Notes (Signed)
Hypoglycemic Event  CBG: 69  Treatment: 8 oz juice/soda  Symptoms: None  Follow-up CBG: [id number] CBG Result:98  Possible Reasons for Event: Inadequate meal intake  Comments/MD notified:n/a    Ryan Morgan

## 2018-08-27 ENCOUNTER — Other Ambulatory Visit: Payer: Self-pay

## 2018-08-27 ENCOUNTER — Encounter: Payer: Self-pay | Admitting: Family Medicine

## 2018-08-27 ENCOUNTER — Ambulatory Visit (INDEPENDENT_AMBULATORY_CARE_PROVIDER_SITE_OTHER): Payer: Medicare HMO | Admitting: Family Medicine

## 2018-08-27 VITALS — BP 124/78 | HR 60 | Temp 97.9°F | Ht 70.0 in | Wt 231.4 lb

## 2018-08-27 DIAGNOSIS — E11311 Type 2 diabetes mellitus with unspecified diabetic retinopathy with macular edema: Secondary | ICD-10-CM | POA: Diagnosis not present

## 2018-08-27 DIAGNOSIS — I25118 Atherosclerotic heart disease of native coronary artery with other forms of angina pectoris: Secondary | ICD-10-CM

## 2018-08-27 DIAGNOSIS — R531 Weakness: Secondary | ICD-10-CM

## 2018-08-27 DIAGNOSIS — E66811 Obesity, class 1: Secondary | ICD-10-CM

## 2018-08-27 DIAGNOSIS — M3130 Wegener's granulomatosis without renal involvement: Secondary | ICD-10-CM

## 2018-08-27 DIAGNOSIS — I10 Essential (primary) hypertension: Secondary | ICD-10-CM | POA: Diagnosis not present

## 2018-08-27 DIAGNOSIS — R55 Syncope and collapse: Secondary | ICD-10-CM

## 2018-08-27 DIAGNOSIS — Z6833 Body mass index (BMI) 33.0-33.9, adult: Secondary | ICD-10-CM

## 2018-08-27 DIAGNOSIS — Z6835 Body mass index (BMI) 35.0-35.9, adult: Secondary | ICD-10-CM | POA: Diagnosis not present

## 2018-08-27 DIAGNOSIS — E6609 Other obesity due to excess calories: Secondary | ICD-10-CM | POA: Diagnosis not present

## 2018-08-27 DIAGNOSIS — E66812 Obesity, class 2: Secondary | ICD-10-CM

## 2018-08-27 NOTE — Assessment & Plan Note (Signed)
Had to re schedule f/u with specialist in [facility] due to covid  He is doing ok  Breathing has been stable

## 2018-08-27 NOTE — Assessment & Plan Note (Signed)
Resolved since presentation to hospital Reviewed hospital records, lab results and studies in detail

## 2018-08-27 NOTE — Assessment & Plan Note (Signed)
S/p hospitalization Reviewed hospital records, lab results and studies in detail   Thought to be due to dehydration/orthostatic hypotension with vaso vagal symptoms  Nl exam today  Reassuring w/u Will f/u with cardiology as planned  Enc him strongly to keep up with 64 oz fluid intake daily and use caution in the heat  inst to alert me if symptoms return

## 2018-08-27 NOTE — Progress Notes (Signed)
Subjective:    Patient ID: Ryan Morgan, male    DOB: 1940-06-24, 78 y.o.   MRN: [id number]  HPI Here for hospitalization from 3/27 to 08/25/18 For near syncope thought to be from orthostatic hypotension in setting of dehydration PVCs were noted on telemetry as well as bradycardia   This occurred while carrying a chair outdoors at 2 pm the day of hosp He felt suddenly sick and light headed/nauseated /sweaty - went in to the house and then got worse (very scary)- took 2 aspirins  Had coffee and just a little water that day  Called EMS Blood glucose was 130 at that time  Monitor was ok   hosp course as follows: Hospital Course:  1.Near syncope: Cardiac versus vagal. Orthostatic blood upon presentation but not at discharge. Could have been vagalgiven prodrome of nausea and sweating in setting of dehydration. Tropon negative x3.Will defer echo at this time as no complaints of chest pain or leg swellings and staff shortage incoronavirus pandemic. No s/sx infection. No metabolic derangement.Can follow-up primary cardiologist Dr. Tawana Scale discharge.   2.Sinus bradycardia: Unlikely to be contributing to problem #1. HR range 55-64. TSH within normal limits. Patient on beta-blockers. No events on tele.   3.CAD, ischemic cardiomyopathy, chronic systolic CHF: remained compensated. Resume aspirin, Plavix, Coreg, Entresto and other home medications.  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  Since last visit he thinks his rash/itching is from wegners  Breathing has been ok  Saw specialist at [facility] planned for wegners - has to re schedule due to covid   5.Diabetes mellitus: controlled. Resume oral hypoglycemics. no   hypo-/hyperglycemia.    6.Hyperlipidemia:Resume home medications  Lab Results  Component Value Date   CREATININE 0.83 08/24/2018   BUN 12 08/24/2018   NA 138 08/24/2018   K 4.7 08/24/2018   CL 106 08/24/2018   CO2 22 08/24/2018   Lab Results  Component Value Date   WBC 4.9 08/24/2018   HGB 12.3 (L) 08/24/2018   HCT 36.2 (L) 08/24/2018   MCV 92.8 08/24/2018   PLT 157 08/24/2018    Lab Results  Component Value Date   TROPONINI <0.03 08/25/2018    Last cbg was 98 at time of d/c   Wt Readings from Last 3 Encounters:  08/27/18 231 lb 6 oz (105 kg)  08/25/18 229 lb 12.8 oz (104.2 kg)  06/14/18 234 lb 6.4 oz (106.3 kg)   33.20 kg/m   bp is stable today  No cp or palpitations or headaches or edema  No side effects to medicines  BP Readings from Last 3 Encounters:  08/27/18 124/78  08/25/18 135/74  06/14/18 126/78     Orthostatic bp are normal sitting/lying today  Pulse Readings from Last 3 Encounters:  08/27/18 60  08/25/18 64  06/14/18 61   bp and 02 levels are very good at home  97% on RA avg   Pt sees endocrinology for DM Lab Results  Component Value Date   HGBA1C 7.6 (H) 08/24/2018  a while back sugar went up (jan and feb)  /now back to normal at home /glucose readings are much better  Dr Loanne Drilling follows   Drinking more fluids/making the effort   No exp to covid/not getting  out  Has neighbors who watch out for them    Lab Results  Component Value Date   CHOL 99 11/20/2017   HDL 41.80 11/20/2017   LDLCALC 38 11/20/2017   LDLDIRECT 100.0 05/17/2017   TRIG 96.0 11/20/2017   CHOLHDL 2 11/20/2017    He has annual visit /lab here in June  Patient Active Problem List   Diagnosis Date Noted   Near syncope 08/25/2018   Hip joint effusion, left 05/03/2018   Laceration of arm 12/04/2017   Current chronic use of systemic steroids 11/15/2016   Routine general medical examination at a health care facility 11/09/2015   Hearing loss  11/09/2015   History of colonic polyps 11/09/2015   Rash of hands 03/10/2015   Colon cancer screening 06/09/2014   Blepharitis, bilateral 06/02/2014   Shortness of breath 03/10/2014   Benign paroxysmal positional vertigo 12/14/2013   Bursitis of right hip 10/17/2013   Coronary artery disease of native artery of native heart with stable angina pectoris ([place]) 03/19/2013   Encounter for Medicare annual wellness exam 03/05/2013   Adverse effect of glucocorticoid or synthetic analogue 03/05/2013   Osteopenia 03/05/2013   Obesity 07/15/2011   Prostate cancer screening 07/10/2011   Cough 12/23/2010   Hypertension 08/30/2010   Hyperlipemia 08/30/2010   Diabetes mellitus type 2 with retinopathy ([place]) 08/30/2010   Wegener's granulomatosis ([place]) 08/30/2010   Past Medical History:  Diagnosis Date   Coronary artery disease    a. prior LAD stenting 2000; b. [place] 2003 LAD 40, D1 50; c. Lexiscan 02/2017: large defect of severe severity in the basal inferoseptal, basal inferior, mid inferoseptal, mid inferior and apical inferior location, high risk; d. LHC 03/29/17: LM nl, patent LAD stent w/ 40% ISR, D1 100,  OM1 90, RPDA 100 w/ L-R collats, post atrio 80% s/p PCI/DES     Diabetes mellitus    Hyperlipidemia    Hypertension    Ischemic cardiomyopathy    a. TTE 02/2017: EF 30-35%, diffuse HK, normal LV diastolic function parameters, mild AI/MR, RV systolic function normal, PASP normal   Lung nodule    a. s/p prior thoracotomy   Morbid obesity ([place])    Wegener's granulomatosis ([place]) 2007   Past Surgical History:  Procedure Laterality Date   BASAL CELL CARCINOMA EXCISION     removed from face x 3   [place]   stent in heart   CORONARY ANGIOPLASTY  09/22/1998   s/p stent placement; [facility] 3.0 x 18 mm Ref [id number]   CORONARY STENT INTERVENTION N/A 03/29/2017   Procedure: CORONARY STENT INTERVENTION;  Surgeon: Wellington Hampshire, MD;  Location: [facility];  Service: Cardiovascular;  Laterality: N/A;   LEFT HEART CATH AND CORONARY ANGIOGRAPHY N/A 03/29/2017   Procedure: LEFT HEART CATH AND CORONARY ANGIOGRAPHY;  Surgeon: Wellington Hampshire, MD;  Location: [facility];  Service: Cardiovascular;  Laterality: N/A;   LUNG SURGERY     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   ULTRASOUND GUIDANCE FOR VASCULAR ACCESS  03/29/2017   Procedure: Ultrasound Guidance For Vascular Access;  Surgeon: Wellington Hampshire, MD;  Location: [facility];  Service: Cardiovascular;;   Social History   Tobacco Use   Smoking status: Former Smoker    Packs/day: 0.50    Years: 20.00    Pack years: 10.00    Types: Cigarettes    Last attempt to quit: 05/30/1978    Years since  quitting: 40.2   Smokeless tobacco: Never Used  Substance Use Topics   Alcohol use: No    Alcohol/week: 0.0 standard drinks   Drug use: No   Family History  Problem Relation Age of Onset   Cancer Mother        tumor on tonsil   Stroke Maternal Grandfather    Diabetes Neg Hx    Allergies  Allergen Reactions   Metformin And Related Itching   Benicar Hct [Olmesartan Medoxomil-Hctz] Itching    Painful and itchy knots on palms of hands   Bystolic [Nebivolol Hcl] Other (See Comments)    headache   Dapsone Hives   Lovaza [Omega-3-Acid Ethyl Esters] Other (See Comments)    Unknown reaction   Niaspan [Niacin Er] Other (See Comments)    flushing   Septra [Bactrim] Hives   Current Outpatient Medications on File Prior to Visit  Medication Sig Dispense Refill   aspirin EC 81 MG tablet Take 81 mg by mouth daily.     atorvastatin (LIPITOR) 20 MG tablet Take 1 tablet (20 mg total) by mouth daily. 90 tablet 1   carvedilol (COREG) 6.25 MG tablet Take 1 tablet by mouth twice daily 180 tablet 0   cetirizine (ZYRTEC) 10 MG tablet Take 10 mg by mouth 2 (two) times daily.      clobetasol cream (TEMOVATE) 2.40 % Apply 1  application topically 2 (two) times daily. To affected areas (Patient taking differently: Apply 1 application topically 2 (two) times daily as needed (itching/rash). ) 30 g 1   clopidogrel (PLAVIX) 75 MG tablet Take 1 tablet by mouth once daily (Patient taking differently: Take 75 mg by mouth daily. ) 90 tablet 0   diphenhydrAMINE (BENADRYL) 25 MG tablet Take 25 mg by mouth every 6 (six) hours as needed for allergies.      ezetimibe (ZETIA) 10 MG tablet Take 1 tablet (10 mg total) by mouth daily. 90 tablet 0   fexofenadine (ALLEGRA) 180 MG tablet Take 180 mg by mouth daily as needed for allergies.      glucose blood (ONE TOUCH ULTRA TEST) test strip USE 1 STRIP TO CHECK GLUCOSE ONCE DAILY AS DIRECTED 100 each 0   ibuprofen (ADVIL,MOTRIN) 200 MG tablet Take 200-400 mg by mouth every 8 (eight) hours as needed (for pain.).      LANCETS ULTRA THIN 30G MISC by Does not apply route. One Touch     Multiple Vitamin (MULTIVITAMIN WITH MINERALS) TABS tablet Take 1 tablet by mouth daily.     Multiple Vitamins-Minerals (AIRBORNE) TBEF Take 1 tablet by mouth daily as needed (for immune system support). Immune Health/Support     nystatin cream (MYCOSTATIN) Apply 1 application topically 2 (two) times daily as needed for dry skin.     predniSONE (DELTASONE) 10 MG tablet Take 1-2 tablets (10-20 mg total) by mouth daily as needed (for wegener's granulomatosis). 30 tablet 2   repaglinide (PRANDIN) 2 MG tablet Take 2 mg by mouth 3 (three) times daily before meals.     sacubitril-valsartan (ENTRESTO) 49-51 MG Take 1 tablet (49/51 mg) by mouth twice daily 180 tablet 0   spironolactone (ALDACTONE) 25 MG tablet Take 1 tablet (25 mg total) by mouth daily. 90 tablet 0   tetrahydrozoline 0.05 % ophthalmic solution Place 1-2 drops into both eyes 3 (three) times daily as needed (for dry/irritated eyes).     Triamcinolone Acetonide (NASACORT ALLERGY 24HR NA) Place 2 sprays into the nose at bedtime as needed  (allergies).      [  DISCONTINUED] nebivolol (BYSTOLIC) 5 MG tablet Take 1 tablet (5 mg total) by mouth 2 (two) times daily. 180 tablet 3   No current facility-administered medications on file prior to visit.      Review of Systems  Constitutional: Positive for fatigue. Negative for activity change, appetite change, fever and unexpected weight change.  HENT: Negative for congestion, rhinorrhea, sore throat and trouble swallowing.   Eyes: Negative for pain, redness, itching and visual disturbance.  Respiratory: Negative for cough, chest tightness, shortness of breath and wheezing.        No sob lately  Cardiovascular: Negative for chest pain and palpitations.  Gastrointestinal: Negative for abdominal pain, blood in stool, constipation, diarrhea and nausea.  Endocrine: Negative for cold intolerance, heat intolerance, polydipsia and polyuria.  Genitourinary: Negative for difficulty urinating, dysuria, frequency and urgency.  Musculoskeletal: Negative for arthralgias, joint swelling and myalgias.  Skin: Negative for pallor and rash.       Intermittent chronic rash on UEs -thought to be due to Wegener's  Neurological: Negative for dizziness, tremors, seizures, syncope, facial asymmetry, speech difficulty, weakness, light-headedness, numbness and headaches.       Near syncope/vagal symptoms are resolved  Hematological: Negative for adenopathy. Does not bruise/bleed easily.  Psychiatric/Behavioral: Negative for decreased concentration and dysphoric mood. The patient is not nervous/anxious.        Objective:   Physical Exam Constitutional:      General: He is not in acute distress.    Appearance: Normal appearance. He is well-developed. He is obese. He is not ill-appearing or diaphoretic.  HENT:     Head: Normocephalic and atraumatic.     Mouth/Throat:     Mouth: Mucous membranes are moist.     Pharynx: Oropharynx is clear.  Eyes:     General: No scleral icterus.    Extraocular  Movements: Extraocular movements intact.     Conjunctiva/sclera: Conjunctivae normal.     Pupils: Pupils are equal, round, and reactive to light.  Neck:     Musculoskeletal: Normal range of motion and neck supple. No muscular tenderness.     Thyroid: No thyromegaly.     Vascular: No carotid bruit or JVD.  Cardiovascular:     Rate and Rhythm: Regular rhythm. Bradycardia present.     Heart sounds: Normal heart sounds. No gallop.      Comments: No orthostatic bp changes today  Pulmonary:     Effort: Pulmonary effort is normal. No respiratory distress.     Breath sounds: Normal breath sounds. No wheezing or rales.     Comments: bs are clear /somewhat distant No wheeze even on chronic expiration Abdominal:     General: Bowel sounds are normal. There is no distension or abdominal bruit.     Palpations: Abdomen is soft. There is no mass.     Tenderness: There is no abdominal tenderness. There is no guarding.  Musculoskeletal:     Right lower leg: No edema.     Left lower leg: No edema.  Lymphadenopathy:     Cervical: No cervical adenopathy.  Skin:    General: Skin is warm and dry.     Findings: No rash.  Neurological:     Mental Status: He is alert. Mental status is at baseline.     Cranial Nerves: No cranial nerve deficit.     Motor: No weakness.     Coordination: Coordination normal.     Gait: Gait normal.     Deep Tendon Reflexes: Reflexes are normal  and symmetric. Reflexes normal.  Psychiatric:        Mood and Affect: Mood normal.     Comments: cheerful           Assessment & Plan:   Problem List Items Addressed This Visit      Cardiovascular and Mediastinum   Hypertension    bp in fair control at this time after hosp for near syncope Reviewed hospital records, lab results and studies in detail   Nl orthostatics today/he is working on much better fluid intake BP Readings from Last 1 Encounters:  08/27/18 124/78   No changes needed Most recent labs reviewed    Disc lifstyle change with low sodium diet and exercise        Wegener's granulomatosis ([place])    Had to re schedule f/u with specialist in [facility] due to covid  He is doing ok  Breathing has been stable      Coronary artery disease of native artery of native heart with stable angina pectoris ([place])    Recent hospitalization for near syncope Not cardiac  Some PVCs in hospital/ baseline bradycardia  Has routine f/u with cardiology       Near syncope - Primary    S/p hospitalization Reviewed hospital records, lab results and studies in detail   Thought to be due to dehydration/orthostatic hypotension with vaso vagal symptoms  Nl exam today  Reassuring w/u Will f/u with cardiology as planned  Enc him strongly to keep up with 64 oz fluid intake daily and use caution in the heat  inst to alert me if symptoms return         Endocrine   Diabetes mellitus type 2 with retinopathy ([place])    Per pt glucose readings went up in jan/feb and now back down to normal Lab Results  Component Value Date   HGBA1C 7.6 (H) 08/24/2018   Done in hospital Has f/u with Dr Loanne Drilling next mo        Other   Obesity    Discussed how this problem influences overall health and the risks it imposes  Reviewed plan for weight loss with lower calorie diet (via better food choices and also portion control or program like weight watchers) and exercise building up to or more than 30 minutes 5 days per week including some aerobic activity   He is getting more fluids since hosp for dehydration as well       RESOLVED: Weakness generalized    Resolved since presentation to hospital Reviewed hospital records, lab results and studies in detail

## 2018-08-27 NOTE — Assessment & Plan Note (Signed)
Per pt glucose readings went up in jan/feb and now back down to normal Lab Results  Component Value Date   HGBA1C 7.6 (H) 08/24/2018   Done in hospital Has f/u with Dr Loanne Drilling next mo

## 2018-08-27 NOTE — Assessment & Plan Note (Signed)
Recent hospitalization for near syncope Not cardiac  Some PVCs in hospital/ baseline bradycardia  Has routine f/u with cardiology

## 2018-08-27 NOTE — Assessment & Plan Note (Signed)
Discussed how this problem influences overall health and the risks it imposes  Reviewed plan for weight loss with lower calorie diet (via better food choices and also portion control or program like weight watchers) and exercise building up to or more than 30 minutes 5 days per week including some aerobic activity   He is getting more fluids since hosp for dehydration as well

## 2018-08-27 NOTE — Patient Instructions (Addendum)
Try to get 64 oz of fluids daily (mostly water and some calorie free fluids) to prevent dehydration  See Dr Rockey Situ as planned  If symptoms return please let us know  Don't get over heated   Labs and vital signs are re assuring  Take care of yourself

## 2018-08-27 NOTE — Assessment & Plan Note (Signed)
bp in fair control at this time after hosp for near syncope Reviewed hospital records, lab results and studies in detail   Nl orthostatics today/he is working on much better fluid intake BP Readings from Last 1 Encounters:  08/27/18 124/78   No changes needed Most recent labs reviewed  Disc lifstyle change with low sodium diet and exercise

## 2018-08-29 DIAGNOSIS — R69 Illness, unspecified: Secondary | ICD-10-CM | POA: Diagnosis not present

## 2018-09-10 ENCOUNTER — Telehealth: Payer: Self-pay | Admitting: Cardiovascular Disease

## 2018-09-10 ENCOUNTER — Other Ambulatory Visit: Payer: Self-pay

## 2018-09-10 ENCOUNTER — Telehealth (INDEPENDENT_AMBULATORY_CARE_PROVIDER_SITE_OTHER): Payer: Medicare HMO | Admitting: Cardiovascular Disease

## 2018-09-10 DIAGNOSIS — L929 Granulomatous disorder of the skin and subcutaneous tissue, unspecified: Secondary | ICD-10-CM | POA: Diagnosis not present

## 2018-09-10 DIAGNOSIS — R55 Syncope and collapse: Secondary | ICD-10-CM | POA: Diagnosis not present

## 2018-09-10 DIAGNOSIS — M3130 Wegener's granulomatosis without renal involvement: Secondary | ICD-10-CM

## 2018-09-10 DIAGNOSIS — R0602 Shortness of breath: Secondary | ICD-10-CM

## 2018-09-10 DIAGNOSIS — I10 Essential (primary) hypertension: Secondary | ICD-10-CM | POA: Diagnosis not present

## 2018-09-10 DIAGNOSIS — M300 Polyarteritis nodosa: Secondary | ICD-10-CM | POA: Diagnosis not present

## 2018-09-10 DIAGNOSIS — I255 Ischemic cardiomyopathy: Secondary | ICD-10-CM | POA: Diagnosis not present

## 2018-09-10 DIAGNOSIS — I25118 Atherosclerotic heart disease of native coronary artery with other forms of angina pectoris: Secondary | ICD-10-CM | POA: Diagnosis not present

## 2018-09-10 NOTE — Progress Notes (Addendum)
Virtual Visit via Video Note   This visit type was conducted due to national recommendations for restrictions regarding the COVID-19 Pandemic (e.g. social distancing) in an effort to limit this patient's exposure and mitigate transmission in our community.  Due to her co-morbid illnesses, this patient is at least at moderate risk for complications without adequate follow up.  This format is felt to be most appropriate for this patient at this time.  All issues noted in this document were discussed and addressed.  A limited physical exam was performed with this format.  Please refer to the patient's chart for her consent to telehealth for [facility].    Date:  09/10/2018   ID:  Ryan Morgan, DOB May 04, 1941, MRN [id number]  Patient Location:  [address] [place] [address]   Provider location:   [facility], [place] office  PCP:  Abner Greenspan, MD  Cardiologist:  [place], [place]  Chief Complaint:  Near syncope   History of Present Illness:    Ryan Morgan is a 78 y.o. male who presents via audio/video conferencing for a telehealth visit today.   The patient does not symptoms concerning for COVID-19 infection (fever, chills, cough, or new SHORTNESS OF BREATH).   Patient has a past medical history of DM,  smoking hx,  coronary artery disease,  stent placed to his LAD in 2000,  (Lad stent 3.0 x 18 in 2000) repeat catheterization in January 2003 with 40% LAD, 50% diagonal disease, history of Wegener's granulomatosis, on chronic steroids diabetes. Prior thoracotomy surgery on the left for lung nodule, chronic pain on the left that is episodic, stable over several years Chronic shortness of breath, sedentary lifestyle, obesity/deconditioning Stent October 2018 for shortness of breath, chest tightness, fatigue Echocardiogram September 12, 2017 Ejection fraction 30 to 35% who presents for follow up of his CAD and shortness of breath   On prior office visit,  on  entresto 49/51mg  BID carvedilol, spironolactone 25 Stopped lipitor on his own. Wife does not like statins At that time felt well No repeat echocardiogram  in the past year  3 weeks ago, was outside,  Did not feel well, may have had some nausea, diaphoresis Got inside, nearsyncope 08/24/2018 Hospital records reviewed with the patient in detail Felt by the hospital team that he could have had a vagal event given the  prodrome of nausea and sweating Tropon negative x3. Lab work looks normal, no sense of dehydration He does not remember much of a prodrome of nausea or sweating before feeling dizzy Heart rate 55 and higher in the hospital, into the 60s  Since then has felt well with no recurrent symptoms No regular exercise program or activities Sedentary at baseline Reports blood pressure running 130 and higher on a regular basis  Lab work reviewed HBA1C 6.5  No anginal symptoms on exertion   Prior CV studies:   The following studies were reviewed today:  Other past medical history reviewed Echocardiogram October 2018 ejection fraction 30 to 35%  Stress test October 2018 prior MI Prior MI, no significant ischemia Ejection fraction 36%  Cardiac catheterization October 2018 Patent LAD stent mild to moderate in-stent restenosis Occluded diagonal #1 Occluded right PDA with left-to-right collaterals Severe disease right posterior AV groove artery with collaterals Ejection fraction 40% Successful angioplasty stent to ostial right posterior AV groove artery Ejection fraction at that time 40%  Echocardiogram September 12, 2017 Ejection fraction 30 to 35%  Stress test from January  2009 was a treadmill study, ejection fraction estimated at 45%, with medium-sized area of moderate to severe hypoperfusion involving the septal region.  Past Medical History:  Diagnosis Date  . Coronary artery disease    a. prior LAD stenting 2000; b. [place] 2003 LAD 40, D1 50; c. Lexiscan 02/2017:  large defect of severe severity in the basal inferoseptal, basal inferior, mid inferoseptal, mid inferior and apical inferior location, high risk; d. LHC 03/29/17: LM nl, patent LAD stent w/ 40% ISR, D1 100,  OM1 90, RPDA 100 w/ L-R collats, post atrio 80% s/p PCI/DES    . Diabetes mellitus   . Hyperlipidemia   . Hypertension   . Ischemic cardiomyopathy    a. TTE 02/2017: EF 30-35%, diffuse HK, normal LV diastolic function parameters, mild AI/MR, RV systolic function normal, PASP normal  . Lung nodule    a. s/p prior thoracotomy  . Morbid obesity ([place])   . Wegener's granulomatosis ([place]) 2007   Past Surgical History:  Procedure Laterality Date  . BASAL CELL CARCINOMA EXCISION     removed from face x 3  . CARDIAC CATHETERIZATION    . CARDIAC SURGERY  2000   stent in heart  . CORONARY ANGIOPLASTY  09/22/1998   s/p stent placement; Tristar 3.0 x 18 mm Ref [id number]  . CORONARY STENT INTERVENTION N/A 03/29/2017   Procedure: CORONARY STENT INTERVENTION;  Surgeon: Wellington Hampshire, MD;  Location: [facility];  Service: Cardiovascular;  Laterality: N/A;  . LEFT HEART CATH AND CORONARY ANGIOGRAPHY N/A 03/29/2017   Procedure: LEFT HEART CATH AND CORONARY ANGIOGRAPHY;  Surgeon: Wellington Hampshire, MD;  Location: [facility];  Service: Cardiovascular;  Laterality: N/A;  . 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 ULTRASOUND GUIDANCE FOR VASCULAR ACCESS  03/29/2017   Procedure: Ultrasound Guidance For Vascular Access;  Surgeon: Wellington Hampshire, MD;  Location: [place] [facility];  Service: Cardiovascular;;     No outpatient medications have been marked as taking for the 09/10/18 encounter (Telemedicine) with Minna Merritts, MD.     Allergies:   Metformin and related; Benicar hct [olmesartan medoxomil-hctz]; Bystolic [nebivolol hcl]; Dapsone; Lovaza [omega-3-acid ethyl esters]; Niaspan [niacin er]; and Septra [bactrim]   Social History   Tobacco Use  .  Smoking status: Former Smoker    Packs/day: 0.50    Years: 20.00    Pack years: 10.00    Types: Cigarettes    Last attempt to quit: 05/30/1978    Years since quitting: 40.3  . Smokeless tobacco: Never Used  Substance Use Topics  . Alcohol use: No    Alcohol/week: 0.0 standard drinks  . Drug use: No     Current Outpatient Medications on File Prior to Visit  Medication Sig Dispense Refill  . aspirin EC 81 MG tablet Take 81 mg by mouth daily.    Marland Kitchen atorvastatin (LIPITOR) 20 MG tablet Take 1 tablet (20 mg total) by mouth daily. 90 tablet 1  . carvedilol (COREG) 6.25 MG tablet Take 1 tablet by mouth twice daily 180 tablet 0  . cetirizine (ZYRTEC) 10 MG tablet Take 10 mg by mouth 2 (two) times daily.     . clobetasol cream (TEMOVATE) 8.88 % Apply 1 application topically 2 (two) times daily. To affected areas (Patient taking differently: Apply 1 application topically 2 (two) times daily as needed (itching/rash). ) 30 g 1  . clopidogrel (PLAVIX) 75 MG tablet Take 1 tablet by mouth  once daily (Patient taking differently: Take 75 mg by mouth daily. ) 90 tablet 0  . diphenhydrAMINE (BENADRYL) 25 MG tablet Take 25 mg by mouth every 6 (six) hours as needed for allergies.     Marland Kitchen ezetimibe (ZETIA) 10 MG tablet Take 1 tablet (10 mg total) by mouth daily. 90 tablet 0  . fexofenadine (ALLEGRA) 180 MG tablet Take 180 mg by mouth daily as needed for allergies.     Marland Kitchen glucose blood (ONE TOUCH ULTRA TEST) test strip USE 1 STRIP TO CHECK GLUCOSE ONCE DAILY AS DIRECTED 100 each 0  . ibuprofen (ADVIL,MOTRIN) 200 MG tablet Take 200-400 mg by mouth every 8 (eight) hours as needed (for pain.).     Marland Kitchen LANCETS ULTRA THIN 30G MISC by Does not apply route. One Touch    . Multiple Vitamin (MULTIVITAMIN WITH MINERALS) TABS tablet Take 1 tablet by mouth daily.    . Multiple Vitamins-Minerals (AIRBORNE) TBEF Take 1 tablet by mouth daily as needed (for immune system support). Immune Health/Support    . nystatin cream  (MYCOSTATIN) Apply 1 application topically 2 (two) times daily as needed for dry skin.    . predniSONE (DELTASONE) 10 MG tablet Take 1-2 tablets (10-20 mg total) by mouth daily as needed (for wegener's granulomatosis). 30 tablet 2  . repaglinide (PRANDIN) 2 MG tablet Take 2 mg by mouth 3 (three) times daily before meals.    . sacubitril-valsartan (ENTRESTO) 49-51 MG Take 1 tablet (49/51 mg) by mouth twice daily 180 tablet 0  . spironolactone (ALDACTONE) 25 MG tablet Take 1 tablet (25 mg total) by mouth daily. 90 tablet 0  . tetrahydrozoline 0.05 % ophthalmic solution Place 1-2 drops into both eyes 3 (three) times daily as needed (for dry/irritated eyes).    . Triamcinolone Acetonide (NASACORT ALLERGY 24HR NA) Place 2 sprays into the nose at bedtime as needed (allergies).     . [DISCONTINUED] nebivolol (BYSTOLIC) 5 MG tablet Take 1 tablet (5 mg total) by mouth 2 (two) times daily. 180 tablet 3   No current facility-administered medications on file prior to visit.      Family Hx: The patient's family history includes Cancer in his mother; Stroke in his maternal grandfather. There is no history of Diabetes.  ROS:   Please see the history of present illness.    Review of Systems  Constitutional: Negative.   Respiratory: Negative.   Cardiovascular: Negative.   Gastrointestinal: Negative.   Musculoskeletal: Negative.   Neurological: Positive for dizziness and loss of consciousness.  Psychiatric/Behavioral: Negative.   All other systems reviewed and are negative.    Labs/Other Tests and Data Reviewed:    Recent Labs: 11/20/2017: ALT 14 08/24/2018: BUN 12; Creatinine, Ser 0.83; Hemoglobin 12.3; Platelets 157; Potassium 4.7; Sodium 138; TSH 1.115   Recent Lipid Panel Lab Results  Component Value Date/Time   CHOL 99 11/20/2017 11:26 AM   TRIG 96.0 11/20/2017 11:26 AM   HDL 41.80 11/20/2017 11:26 AM   CHOLHDL 2 11/20/2017 11:26 AM   LDLCALC 38 11/20/2017 11:26 AM   LDLDIRECT 100.0  05/17/2017 01:49 PM    Wt Readings from Last 3 Encounters:  08/27/18 231 lb 6 oz (105 kg)  08/25/18 229 lb 12.8 oz (104.2 kg)  06/14/18 234 lb 6.4 oz (106.3 kg)     Exam:    Vital Signs: Vital signs may also be detailed in the HPI There were no vitals taken for this visit.  Wt Readings from Last 3 Encounters:  08/27/18  231 lb 6 oz (105 kg)  08/25/18 229 lb 12.8 oz (104.2 kg)  06/14/18 234 lb 6.4 oz (106.3 kg)   Temp Readings from Last 3 Encounters:  08/27/18 97.9 F (36.6 C) (Oral)  08/25/18 98.4 F (36.9 C) (Oral)  05/17/18 97.6 F (36.4 C)   BP Readings from Last 3 Encounters:  08/27/18 124/78  08/25/18 135/74  06/14/18 126/78   Pulse Readings from Last 3 Encounters:  08/27/18 60  08/25/18 64  06/14/18 61    Blood pressure 130s over 70, pulse 60, respirations 16 weight stable 230 pounds  Well nourished, well developed male in no acute distress. Constitutional:  oriented to person, place, and time. No distress.  Head: Normocephalic and atraumatic.  Eyes:  no discharge. No scleral icterus.  Neck: Normal range of motion. Neck supple.  Pulmonary/Chest: No audible wheezing, no distress, appears comfortable Musculoskeletal: Normal range of motion.  no  tenderness or deformity.  Neurological:   Coordination normal. Full exam not performed Skin:  No rash Psychiatric:  normal mood and affect. behavior is normal. Thought content normal.    ASSESSMENT & PLAN:    Coronary artery disease of native artery of native heart with stable angina pectoris (HCC) Recent episode of near syncope, less likely from ischemia, negative cardiac enzymes, full recovery He is declining further work-up at this time  Granulomatosis with polyangiitis, unspecified whether renal involvement ([place]) Reports symptoms have been stable  Morbid obesity ([place]) We have encouraged continued exercise, careful diet management in an effort to lose weight.  Essential hypertension Blood pressure stable,  no medication changes made  Shortness of breath Recommended regular walking program for conditioning  Ischemic cardiomyopathy He is on several medications for cardiomyopathy We will not advance his medications given recent near syncope episode Recommended echocardiogram to confirm ejection fraction after virus has resolved, just in 3 months time  Near syncope Etiology unclear, unable to exclude vasovagal event Reports he has been well since that time Discussed various options for him for additional work-up including echocardiogram, 2-week event monitor/Zio patch, and stress testing He has declined at this time, prefers to monitor heart rate by hand Reports that he feels well, does not feel that he has a blockage as he has made a full recovery and in fact commented that he made a full recovery on the ambulance on the way to the hospital and felt well during his admission -Suggested he call our office for any recurrent symptoms Suggested he lay supine if he does get dizzy in the future Suggested he stay well-hydrated    COVID-19 Education: The signs and symptoms of COVID-19 were discussed with the patient and how to seek care for testing (follow up with PCP or arrange E-visit).  The importance of social distancing was discussed today.  Patient Risk:   After full review of this patients clinical status, I feel that they are at least moderate risk at this time.  Time:   Today, I have spent 25 minutes with the patient with telehealth technology discussing the cardiac and medical problems/diagnoses detailed above     Medication Adjustments/Labs and Tests Ordered: Current medicines are reviewed at length with the patient today.  Concerns regarding medicines are outlined above.   Tests Ordered: No tests ordered   Medication Changes: No changes made   Disposition: Follow-up in 6 months   Signed, Ida Rogue, MD  09/10/2018 2:21 PM    [facility] [id number],  [place], [place] [address]

## 2018-09-10 NOTE — Patient Instructions (Signed)

## 2018-09-10 NOTE — Telephone Encounter (Signed)
Spoke with patient to see if he had a smartphone that we could do a video visit. He states that he would just prefer a telephone visit instead. Advised that physician would be calling him shortly and to please check his blood pressure and have that reading ready for him. Reviewed consent in detail and he was agreeable to proceed.   YOUR CARDIOLOGY TEAM HAS ARRANGED FOR AN E-VISIT FOR YOUR APPOINTMENT - PLEASE REVIEW IMPORTANT INFORMATION BELOW SEVERAL DAYS PRIOR TO YOUR APPOINTMENT  Due to the recent COVID-19 pandemic, we are transitioning in-person office visits to tele-medicine visits in an effort to decrease unnecessary exposure to our patients and staff. Medicare and most insurances are covering these visits without a copay needed. We also encourage you to sign up for MyChart if you have not already done so. You will need a smartphone if possible. For patients that do not have this, we can still complete the visit using a regular telephone but do prefer a smartphone to enable video when possible. You may have a close family member that lives with you that can help. If possible, we also ask that you have a blood pressure cuff and scale at home to measure your blood pressure, heart rate and weight prior to your scheduled appointment. Patients with clinical needs that need an in-person evaluation and testing will still be able to come to the office if absolutely necessary. If you have any questions, feel free to call our office.    2-3 DAYS BEFORE YOUR APPOINTMENT  You will receive a telephone call from one of our [place] team members - your caller ID may say "Unknown caller." If this is a video visit, we will confirm that you have been able to download the WebEx app. We will remind you check your blood pressure, heart rate and weight prior to your scheduled appointment. If you have an Apple Watch or Kardia, please upload any pertinent ECG strips the day before or morning of your appointment to [facility].  Our staff will also make sure you have reviewed the consent and agree to move forward with your scheduled tele-health visit.     THE DAY OF YOUR APPOINTMENT  Approximately 15 minutes prior to your scheduled appointment, you will receive a telephone call from one of [facility] team - your caller ID may say "Unknown caller."  Our staff will confirm medications, vital signs for the day and any symptoms you may be experiencing. Please have this information available prior to the time of visit start. It may also be helpful for you to have a pad of paper and pen handy for any instructions given during your visit. They will also walk you through joining the WebEx smartphone meeting if this is a video visit.    CONSENT FOR TELE-HEALTH VISIT - PLEASE REVIEW  I hereby voluntarily request, consent and authorize [facility] and its employed or contracted physicians, physician assistants, nurse practitioners or other licensed health care professionals (the Practitioner), to provide me with telemedicine health care services (the "Services") as deemed necessary by the treating Practitioner. I acknowledge and consent to receive the Services by the Practitioner via telemedicine. I understand that the telemedicine visit will involve communicating with the Practitioner through live audiovisual communication technology and the disclosure of certain medical information by electronic transmission. I acknowledge that I have been given the opportunity to request an in-person assessment or other available alternative prior to the telemedicine visit and am voluntarily participating in the telemedicine visit.  I  understand that I have the right to withhold or withdraw my consent to the use of telemedicine in the course of my care at any time, without affecting my right to future care or treatment, and that the Practitioner or I may terminate the telemedicine visit at any time. I understand that I have the right to inspect all  information obtained and/or recorded in the course of the telemedicine visit and may receive copies of available information for a reasonable fee.  I understand that some of the potential risks of receiving the Services via telemedicine include:  Marland Kitchen Delay or interruption in medical evaluation due to technological equipment failure or disruption; . Information transmitted may not be sufficient (e.g. poor resolution of images) to allow for appropriate medical decision making by the Practitioner; and/or  . In rare instances, security protocols could fail, causing a breach of personal health information.  Furthermore, I acknowledge that it is my responsibility to provide information about my medical history, conditions and care that is complete and accurate to the best of my ability. I acknowledge that Practitioner's advice, recommendations, and/or decision may be based on factors not within their control, such as incomplete or inaccurate data provided by me or distortions of diagnostic images or specimens that may result from electronic transmissions. I understand that the practice of medicine is not an exact science and that Practitioner makes no warranties or guarantees regarding treatment outcomes. I acknowledge that I will receive a copy of this consent concurrently upon execution via email to the email address I last provided but may also request a printed copy by calling the office of [facility].    I understand that my insurance will be billed for this visit.   I have read or had this consent read to me. . I understand the contents of this consent, which adequately explains the benefits and risks of the Services being provided via telemedicine.  . I have been provided ample opportunity to ask questions regarding this consent and the Services and have had my questions answered to my satisfaction. . I give my informed consent for the services to be provided through the use of telemedicine in my  medical care  By participating in this telemedicine visit I agree to the above.

## 2018-09-10 NOTE — Telephone Encounter (Signed)
Virtual Visit Pre-Appointment Phone Call  Steps For Call:  1. Confirm consent - "In the setting of the current Covid19 crisis, you are scheduled for a (phone or video) visit with your provider on (date) at (time).  Just as we do with many in-office visits, in order for you to participate in this visit, we must obtain consent.  If you'd like, I can send this to your mychart (if signed up) or email for you to review.  Otherwise, I can obtain your verbal consent now.  All virtual visits are billed to your insurance company just like a normal visit would be.  By agreeing to a virtual visit, we'd like you to understand that the technology does not allow for your provider to perform an examination, and thus may limit your provider's ability to fully assess your condition.  Finally, though the technology is pretty good, we cannot assure that it will always work on either your or our end, and in the setting of a video visit, we may have to convert it to a phone-only visit.  In either situation, we cannot ensure that we have a secure connection.  Are you willing to proceed?"  2. Give patient instructions for WebEx download to smartphone as below if video visit  3. Advise patient to be prepared with any vital sign or heart rhythm information, their current medicines, and a piece of paper and pen handy for any instructions they may receive the day of their visit  4. Inform patient they will receive a phone call 15 minutes prior to their appointment time (may be from unknown caller ID) so they should be prepared to answer  5. Confirm that appointment type is correct in Epic appointment notes (video vs telephone)    TELEPHONE CALL NOTE  Ryan Morgan has been deemed a candidate for a follow-up tele-health visit to limit community exposure during the Covid-19 pandemic. I spoke with the patient via phone to ensure availability of phone/video source, confirm preferred email & phone number, and discuss  instructions and expectations.  I reminded Ryan Morgan to be prepared with any vital sign and/or heart rhythm information that could potentially be obtained via home monitoring, at the time of his visit. I reminded Ryan Morgan to expect a phone call at the time of his visit if his visit.  Did the patient verbally acknowledge consent to treatment? yes  Clarisse Gouge 09/10/2018 9:26 AM   DOWNLOADING THE WEBEX SOFTWARE TO SMARTPHONE  - If Apple, go to [employer] and type in WebEx in the search bar. [place] [employer], the blue/green circle. The app is free but as with any other app downloads, their phone may require them to verify saved payment information or Apple password. The patient does NOT have to create an account.  - If Android, ask patient to go to [employer] and type in WebEx in the search bar. [place] [employer], the blue/green circle. The app is free but as with any other app downloads, their phone may require them to verify saved payment information or Android password. The patient does NOT have to create an account.   CONSENT FOR TELE-HEALTH VISIT - PLEASE REVIEW  I hereby voluntarily request, consent and authorize [facility] and its employed or contracted physicians, physician assistants, nurse practitioners or other licensed health care professionals (the Practitioner), to provide me with telemedicine health care services (the Services") as deemed necessary by the treating Practitioner. I  acknowledge and consent to receive the Services by the Practitioner via telemedicine. I understand that the telemedicine visit will involve communicating with the Practitioner through live audiovisual communication technology and the disclosure of certain medical information by electronic transmission. I acknowledge that I have been given the opportunity to request an in-person assessment or other available alternative prior to the telemedicine visit and am  voluntarily participating in the telemedicine visit.  I understand that I have the right to withhold or withdraw my consent to the use of telemedicine in the course of my care at any time, without affecting my right to future care or treatment, and that the Practitioner or I may terminate the telemedicine visit at any time. I understand that I have the right to inspect all information obtained and/or recorded in the course of the telemedicine visit and may receive copies of available information for a reasonable fee.  I understand that some of the potential risks of receiving the Services via telemedicine include:   Delay or interruption in medical evaluation due to technological equipment failure or disruption;  Information transmitted may not be sufficient (e.g. poor resolution of images) to allow for appropriate medical decision making by the Practitioner; and/or   In rare instances, security protocols could fail, causing a breach of personal health information.  Furthermore, I acknowledge that it is my responsibility to provide information about my medical history, conditions and care that is complete and accurate to the best of my ability. I acknowledge that Practitioner's advice, recommendations, and/or decision may be based on factors not within their control, such as incomplete or inaccurate data provided by me or distortions of diagnostic images or specimens that may result from electronic transmissions. I understand that the practice of medicine is not an exact science and that Practitioner makes no warranties or guarantees regarding treatment outcomes. I acknowledge that I will receive a copy of this consent concurrently upon execution via email to the email address I last provided but may also request a printed copy by calling the office of [facility].    I understand that my insurance will be billed for this visit.   I have read or had this consent read to me.  I understand the  contents of this consent, which adequately explains the benefits and risks of the Services being provided via telemedicine.   I have been provided ample opportunity to ask questions regarding this consent and the Services and have had my questions answered to my satisfaction.  I give my informed consent for the services to be provided through the use of telemedicine in my medical care  By participating in this telemedicine visit I agree to the above.

## 2018-09-17 ENCOUNTER — Ambulatory Visit: Payer: Medicare HMO | Admitting: Endocrinology

## 2018-10-02 ENCOUNTER — Telehealth: Payer: Self-pay | Admitting: Pulmonary Disease

## 2018-10-02 NOTE — Telephone Encounter (Signed)
LVM for someone with [facility] at phone [phone] to return our call. X1 Message requesting pt's last two ov notes for pt to be faxed to [phone]

## 2018-10-03 NOTE — Telephone Encounter (Signed)
Pt's last two OV's have been faxed to [facility] per their request at the given fax number. Nothing further needed.

## 2018-10-13 IMAGING — DX DG RIBS 2V*R*
5 series · 5 of 5 positions shown · non-contrast
Comparison: 12/30/16

CLINICAL DATA: Right-sided chest pain

EXAM:
RIGHT RIBS - 2 VIEW

[rib pa (1 of 2)]
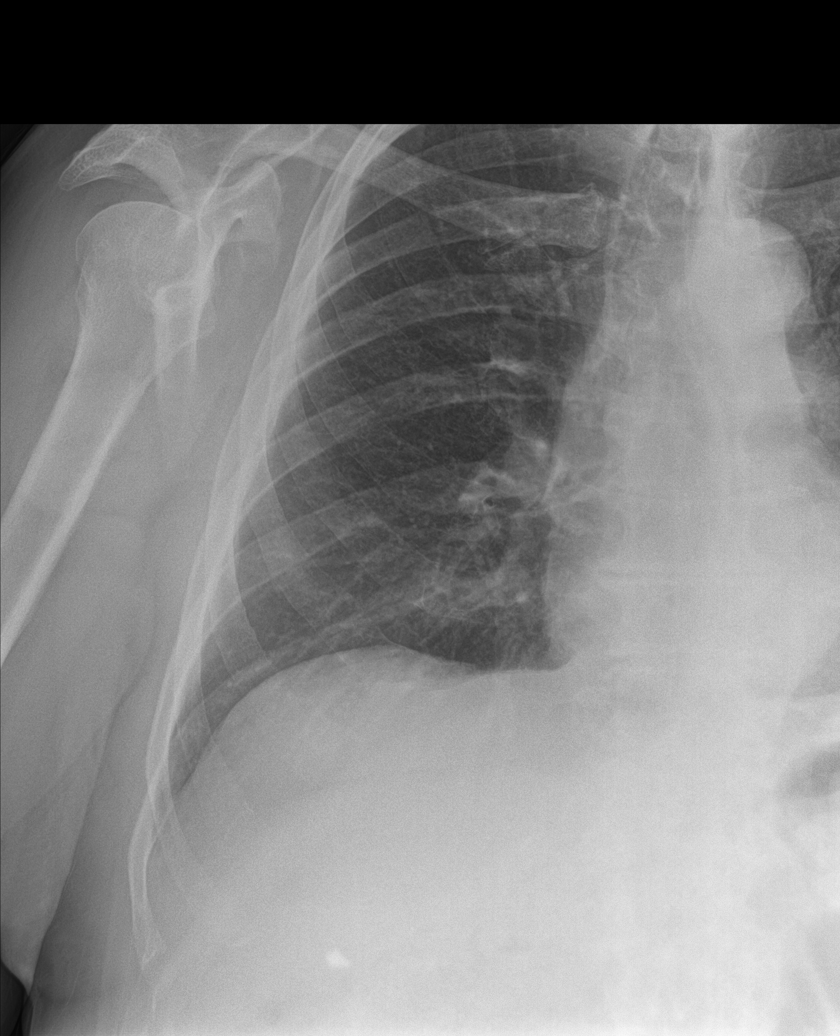

[rib obl (1 of 2)]
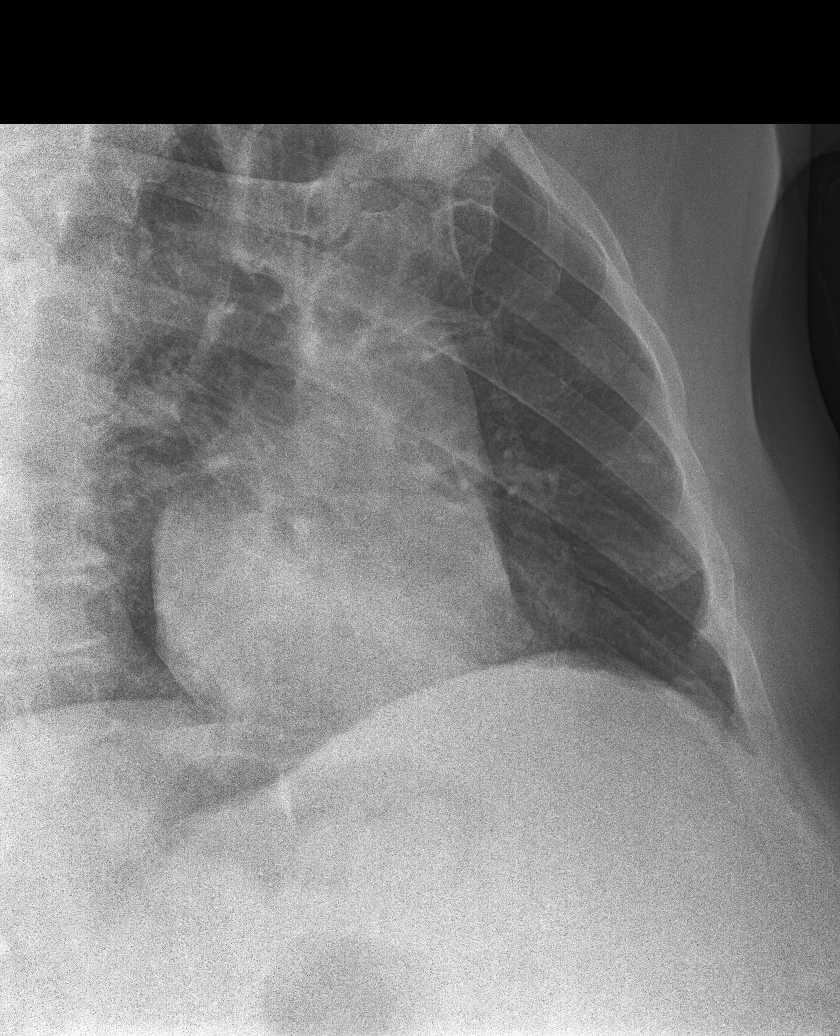

[rib obl (2 of 2)]
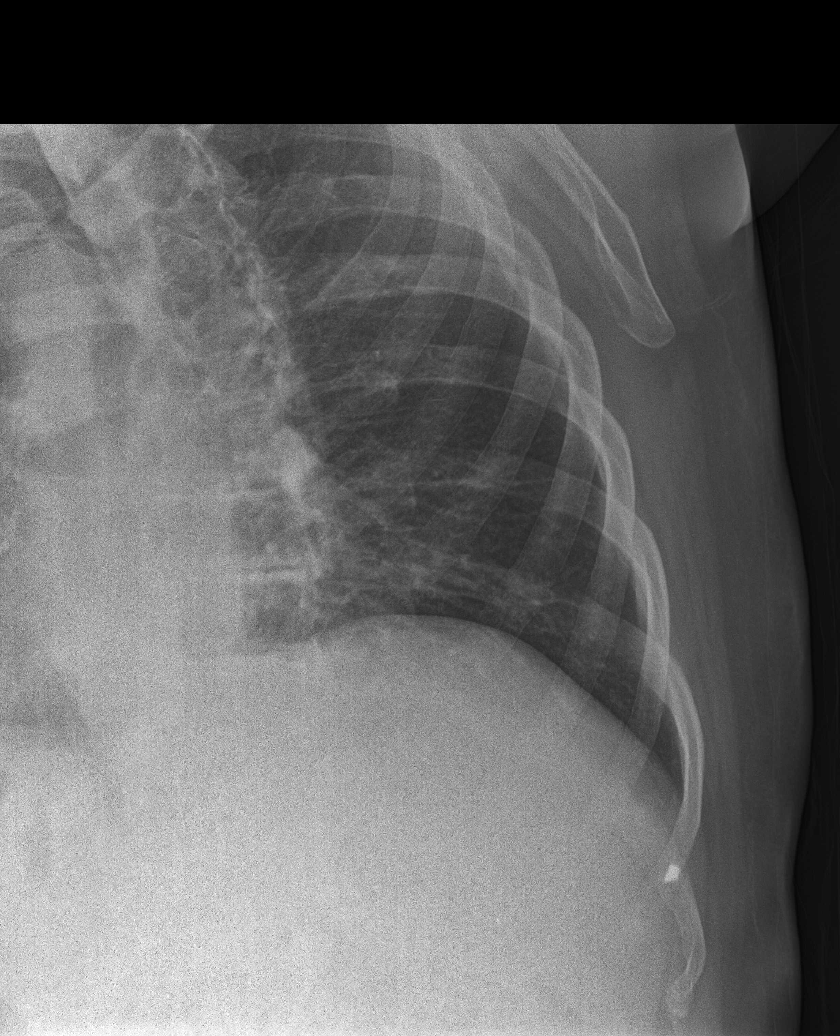

[chest pa]
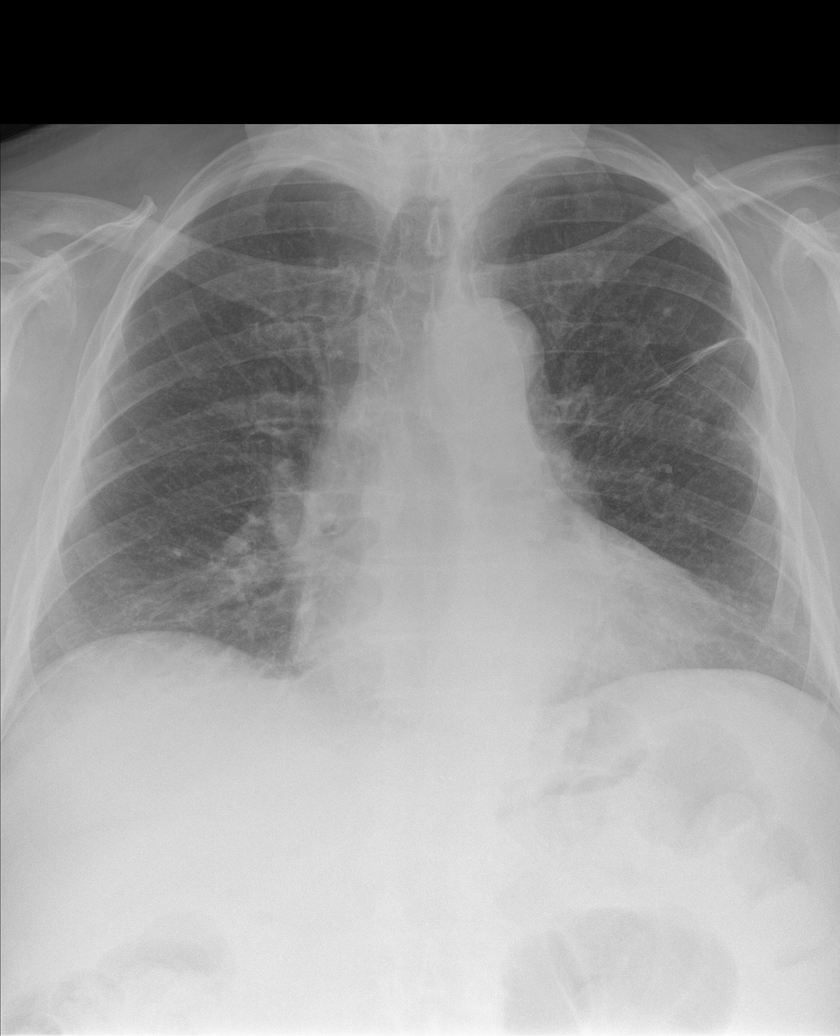

[rib pa (2 of 2)]
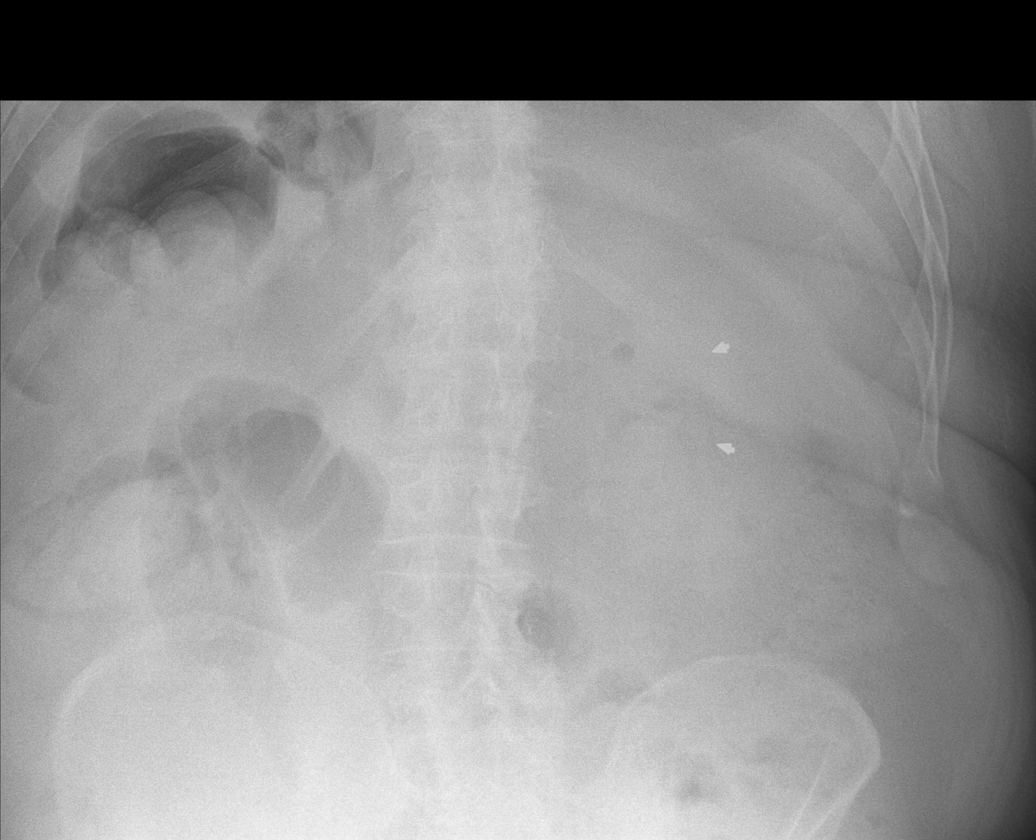

[5 of 5 positions shown; findings below may reference images not displayed]

FINDINGS: Cardiac shadow is mildly enlarged. Mild scarring is again seen in
the left upper lobe. No focal infiltrate or sizable effusion is
seen. No pneumothorax is noted. No definitive rib fracture is seen.
IMPRESSION: No acute abnormality noted.

## 2018-10-17 NOTE — Telephone Encounter (Signed)
Called patient.  No answer. LMOV.   

## 2018-10-29 ENCOUNTER — Ambulatory Visit (INDEPENDENT_AMBULATORY_CARE_PROVIDER_SITE_OTHER): Payer: Medicare HMO | Admitting: Endocrinology

## 2018-10-29 ENCOUNTER — Other Ambulatory Visit: Payer: Self-pay

## 2018-10-29 DIAGNOSIS — E11311 Type 2 diabetes mellitus with unspecified diabetic retinopathy with macular edema: Secondary | ICD-10-CM

## 2018-10-29 MED ORDER — REPAGLINIDE 2 MG PO TABS: 2.0000 mg | ORAL_TABLET | Freq: Three times a day (TID) | ORAL | 3 refills | Status: DC

## 2018-10-29 NOTE — Progress Notes (Signed)
Subjective:    Patient ID: Ryan Morgan, male    DOB: 09-03-1940, 78 y.o.   MRN: [id number]  HPI  telehealth visit today via phone x 8 minutes.   Alternatives to telehealth are presented to this patient, and the patient agrees to the telehealth visit.   Pt is advised of the cost of the visit, and agrees to this, also.   Patient is at home, and I am at the office.   Persons attending the telehealth visit: the patient and I.   Pt returns for f/u of diabetes mellitus: DM type: 2 Dx'ed: 6160 Complications: polyneuropathy, DR, and CAD.   Therapy: repaglinide DKA: never Severe hypoglycemia: never.   Pancreatitis: never Pancreatic imaging: never Other: he takes intermittent prednisone for Wegener's granulomatosis; he did not tolerate metformin (rash); he cannot afford brand name meds.   Interval history: he again took steroids last week.  Pt states this increased cbg's to the 200's.  Other than that, cbg varies from 90-166.  There is no trend throughout the day.  pt states he feels well in general.   Past Medical History:  Diagnosis Date  . Coronary artery disease    a. prior LAD stenting 2000; b. [place] 2003 LAD 40, D1 50; c. Lexiscan 02/2017: large defect of severe severity in the basal inferoseptal, basal inferior, mid inferoseptal, mid inferior and apical inferior location, high risk; d. LHC 03/29/17: LM nl, patent LAD stent w/ 40% ISR, D1 100,  OM1 90, RPDA 100 w/ L-R collats, post atrio 80% s/p PCI/DES    . Diabetes mellitus   . Hyperlipidemia   . Hypertension   . Ischemic cardiomyopathy    a. TTE 02/2017: EF 30-35%, diffuse HK, normal LV diastolic function parameters, mild AI/MR, RV systolic function normal, PASP normal  . Lung nodule    a. s/p prior thoracotomy  . Morbid obesity ([facility])   . Wegener's granulomatosis ([facility]) 2007    Past Surgical History:  Procedure Laterality Date  . BASAL CELL CARCINOMA EXCISION     removed from face x 3  . CARDIAC CATHETERIZATION    . CARDIAC  SURGERY  2000   stent in heart  . CORONARY ANGIOPLASTY  09/22/1998   s/p stent placement; [facility] 3.0 x 18 mm Ref [id number]  . CORONARY STENT INTERVENTION N/A 03/29/2017   Procedure: CORONARY STENT INTERVENTION;  Surgeon: Wellington Hampshire, MD;  Location: [facility];  Service: Cardiovascular;  Laterality: N/A;  . LEFT HEART CATH AND CORONARY ANGIOGRAPHY N/A 03/29/2017   Procedure: LEFT HEART CATH AND CORONARY ANGIOGRAPHY;  Surgeon: Wellington Hampshire, MD;  Location: [facility];  Service: Cardiovascular;  Laterality: N/A;  . 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 ULTRASOUND GUIDANCE FOR VASCULAR ACCESS  03/29/2017   Procedure: Ultrasound Guidance For Vascular Access;  Surgeon: Wellington Hampshire, MD;  Location: [facility];  Service: Cardiovascular;;    Social History   Socioeconomic History  . Marital status: Married    Spouse name: Not on file  . Number of children: Not on file  . Years of education: Not on file  . Highest education level: Not on file  Occupational History  . Not on file  Social Needs  . Financial resource strain: Not on file  . Food insecurity:    Worry: Not on file    Inability: Not on file  . Transportation needs:    Medical: Not on file  Non-medical: Not on file  Tobacco Use  . Smoking status: Former Smoker    Packs/day: 0.50    Years: 20.00    Pack years: 10.00    Types: Cigarettes    Last attempt to quit: 05/30/1978    Years since quitting: 40.4  . Smokeless tobacco: Never Used  Substance and Sexual Activity  . Alcohol use: No    Alcohol/week: 0.0 standard drinks  . Drug use: No  . Sexual activity: Never  Lifestyle  . Physical activity:    Days per week: Not on file    Minutes per session: Not on file  . Stress: Not on file  Relationships  . Social connections:    Talks on phone: Not on file    Gets together: Not on file    Attends religious service: Not on file    Active member of club or  organization: Not on file    Attends meetings of clubs or organizations: Not on file    Relationship status: Not on file  . Intimate partner violence:    Fear of current or ex partner: Not on file    Emotionally abused: Not on file    Physically abused: Not on file    Forced sexual activity: Not on file  Other Topics Concern  . Not on file  Social History Narrative   Regular exercise: yes   Caffeine use: very little    Current Outpatient Medications on File Prior to Visit  Medication Sig Dispense Refill  . aspirin EC 81 MG tablet Take 81 mg by mouth daily.    Marland Kitchen atorvastatin (LIPITOR) 20 MG tablet Take 1 tablet (20 mg total) by mouth daily. 90 tablet 1  . carvedilol (COREG) 6.25 MG tablet Take 1 tablet by mouth twice daily 180 tablet 0  . cetirizine (ZYRTEC) 10 MG tablet Take 10 mg by mouth 2 (two) times daily.     . clobetasol cream (TEMOVATE) 7.09 % Apply 1 application topically 2 (two) times daily. To affected areas (Patient taking differently: Apply 1 application topically 2 (two) times daily as needed (itching/rash). ) 30 g 1  . clopidogrel (PLAVIX) 75 MG tablet Take 1 tablet by mouth once daily (Patient taking differently: Take 75 mg by mouth daily. ) 90 tablet 0  . diphenhydrAMINE (BENADRYL) 25 MG tablet Take 25 mg by mouth every 6 (six) hours as needed for allergies.     Marland Kitchen ezetimibe (ZETIA) 10 MG tablet Take 1 tablet (10 mg total) by mouth daily. 90 tablet 0  . fexofenadine (ALLEGRA) 180 MG tablet Take 180 mg by mouth daily as needed for allergies.     Marland Kitchen glucose blood (ONE TOUCH ULTRA TEST) test strip USE 1 STRIP TO CHECK GLUCOSE ONCE DAILY AS DIRECTED 100 each 0  . ibuprofen (ADVIL,MOTRIN) 200 MG tablet Take 200-400 mg by mouth every 8 (eight) hours as needed (for pain.).     Marland Kitchen LANCETS ULTRA THIN 30G MISC by Does not apply route. One Touch    . Multiple Vitamin (MULTIVITAMIN WITH MINERALS) TABS tablet Take 1 tablet by mouth daily.    . Multiple Vitamins-Minerals (AIRBORNE) TBEF  Take 1 tablet by mouth daily as needed (for immune system support). Immune Health/Support    . nystatin cream (MYCOSTATIN) Apply 1 application topically 2 (two) times daily as needed for dry skin.    Marland Kitchen sacubitril-valsartan (ENTRESTO) 49-51 MG Take 1 tablet (49/51 mg) by mouth twice daily 180 tablet 0  . spironolactone (ALDACTONE) 25 MG  tablet Take 1 tablet (25 mg total) by mouth daily. 90 tablet 0  . tetrahydrozoline 0.05 % ophthalmic solution Place 1-2 drops into both eyes 3 (three) times daily as needed (for dry/irritated eyes).    . Triamcinolone Acetonide (NASACORT ALLERGY 24HR NA) Place 2 sprays into the nose at bedtime as needed (allergies).     . [DISCONTINUED] nebivolol (BYSTOLIC) 5 MG tablet Take 1 tablet (5 mg total) by mouth 2 (two) times daily. 180 tablet 3   No current facility-administered medications on file prior to visit.     Allergies  Allergen Reactions  . Metformin And Related Itching  . Benicar Hct [Olmesartan Medoxomil-Hctz] Itching    Painful and itchy knots on palms of hands  . Bystolic [Nebivolol Hcl] Other (See Comments)    headache  . Dapsone Hives  . Lovaza [Omega-3-Acid Ethyl Esters] Other (See Comments)    Unknown reaction  . Niaspan [Niacin Er] Other (See Comments)    flushing  . Septra [Bactrim] Hives    Family History  Problem Relation Age of Onset  . Cancer Mother        tumor on tonsil  . Stroke Maternal Grandfather   . Diabetes Neg Hx       Review of Systems He denies hypoglycemia.       Objective:   Physical Exam    Lab Results  Component Value Date   CREATININE 0.83 08/24/2018   BUN 12 08/24/2018   NA 138 08/24/2018   K 4.7 08/24/2018   CL 106 08/24/2018   CO2 22 08/24/2018       Assessment & Plan:  Type 2 DM, with CAD: he needs increased rx.  Wegener's granulomatosis CAD: there is an interaction between plavix and repaglinide.     If you go back on steroids, double up on the repaglinide until blood sugar improved back  to the 100's.   Please come back for a follow-up appointment in 1-2 months.

## 2018-11-13 DIAGNOSIS — R69 Illness, unspecified: Secondary | ICD-10-CM | POA: Diagnosis not present

## 2018-11-15 DIAGNOSIS — M3131 Wegener's granulomatosis with renal involvement: Secondary | ICD-10-CM | POA: Diagnosis not present

## 2018-11-15 DIAGNOSIS — I10 Essential (primary) hypertension: Secondary | ICD-10-CM | POA: Diagnosis not present

## 2018-11-15 DIAGNOSIS — I2510 Atherosclerotic heart disease of native coronary artery without angina pectoris: Secondary | ICD-10-CM | POA: Diagnosis not present

## 2018-11-15 DIAGNOSIS — M1990 Unspecified osteoarthritis, unspecified site: Secondary | ICD-10-CM | POA: Diagnosis not present

## 2018-11-15 DIAGNOSIS — E669 Obesity, unspecified: Secondary | ICD-10-CM | POA: Diagnosis not present

## 2018-11-15 DIAGNOSIS — E785 Hyperlipidemia, unspecified: Secondary | ICD-10-CM | POA: Diagnosis not present

## 2018-11-15 DIAGNOSIS — I255 Ischemic cardiomyopathy: Secondary | ICD-10-CM | POA: Diagnosis not present

## 2018-11-15 DIAGNOSIS — E119 Type 2 diabetes mellitus without complications: Secondary | ICD-10-CM | POA: Diagnosis not present

## 2018-11-15 DIAGNOSIS — Z79899 Other long term (current) drug therapy: Secondary | ICD-10-CM | POA: Diagnosis not present

## 2018-11-15 DIAGNOSIS — I776 Arteritis, unspecified: Secondary | ICD-10-CM | POA: Diagnosis not present

## 2018-11-23 ENCOUNTER — Ambulatory Visit (INDEPENDENT_AMBULATORY_CARE_PROVIDER_SITE_OTHER): Payer: Medicare HMO

## 2018-11-23 DIAGNOSIS — I10 Essential (primary) hypertension: Secondary | ICD-10-CM

## 2018-11-23 DIAGNOSIS — E785 Hyperlipidemia, unspecified: Secondary | ICD-10-CM

## 2018-11-23 DIAGNOSIS — Z0000 Encounter for general adult medical examination without abnormal findings: Secondary | ICD-10-CM

## 2018-11-23 DIAGNOSIS — Z125 Encounter for screening for malignant neoplasm of prostate: Secondary | ICD-10-CM | POA: Diagnosis not present

## 2018-11-23 DIAGNOSIS — E11311 Type 2 diabetes mellitus with unspecified diabetic retinopathy with macular edema: Secondary | ICD-10-CM

## 2018-11-23 NOTE — Progress Notes (Signed)
PCP notes:   Health maintenance:  Microalbumin - ordered  Abnormal screenings:   None  Patient concerns:   None  Nurse concerns:  None  Next PCP appt:   11/26/18 @ 0920

## 2018-11-23 NOTE — Progress Notes (Signed)
Subjective:   Ryan Morgan is a 78 y.o. male who presents for Medicare Annual/Subsequent preventive examination.  Review of Systems:  N/A Cardiac Risk Factors include: advanced age (>62men, >27 women);obesity (BMI >30kg/m2);diabetes mellitus;dyslipidemia;hypertension;male gender     Objective:    Vitals: There were no vitals taken for this visit.  There is no height or weight on file to calculate BMI.  Advanced Directives 11/23/2018 08/24/2018 08/24/2018 11/20/2017 03/29/2017 11/15/2016 10/09/2015  Does Patient Have a Medical Advance Directive? No No No No No No No  Does patient want to make changes to medical advance directive? - No - Patient declined No - Patient declined - - - -  Would patient like information on creating a medical advance directive? No - Patient declined No - Patient declined No - Patient declined Yes (MAU/Ambulatory/Procedural Areas - Information given) No - Patient declined - Yes - Scientist, clinical (histocompatibility and immunogenetics) given    Tobacco Social History   Tobacco Use  Smoking Status Former Smoker  . Packs/day: 0.50  . Years: 20.00  . Pack years: 10.00  . Types: Cigarettes  . Quit date: 05/30/1978  . Years since quitting: 40.5  Smokeless Tobacco Never Used     Counseling given: No   Clinical Intake:  Pre-visit preparation completed: Yes  Pain : No/denies pain Pain Score: 0-No pain     Nutritional Status: BMI > 30  Obese Nutritional Risks: None  How often do you need to have someone help you when you read instructions, pamphlets, or other written materials from your doctor or pharmacy?: 1 - Never What is the last grade level you completed in school?: Associate degree  Interpreter Needed?: No  Comments: pt lives with spouse Information entered by :: LPinson, RN  Past Medical History:  Diagnosis Date  . Coronary artery disease    a. prior LAD stenting 2000; b. [place] 2003 LAD 40, D1 50; c. Lexiscan 02/2017: large defect of severe severity in the basal inferoseptal,  basal inferior, mid inferoseptal, mid inferior and apical inferior location, high risk; d. LHC 03/29/17: LM nl, patent LAD stent w/ 40% ISR, D1 100,  OM1 90, RPDA 100 w/ L-R collats, post atrio 80% s/p PCI/DES    . Diabetes mellitus   . Hyperlipidemia   . Hypertension   . Ischemic cardiomyopathy    a. TTE 02/2017: EF 30-35%, diffuse HK, normal LV diastolic function parameters, mild AI/MR, RV systolic function normal, PASP normal  . Lung nodule    a. s/p prior thoracotomy  . Morbid obesity ([place])   . 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 BASAL CELL CARCINOMA EXCISION     removed from face x 3  . CARDIAC CATHETERIZATION    . CARDIAC SURGERY  2000   stent in heart  . CORONARY ANGIOPLASTY  09/22/1998   s/p stent placement; [facility] 3.0 x 18 mm Ref [id number]  . CORONARY STENT INTERVENTION N/A 03/29/2017   Procedure: CORONARY STENT INTERVENTION;  Surgeon: Wellington Hampshire, MD;  Location: [facility];  Service: Cardiovascular;  Laterality: N/A;  . LEFT HEART CATH AND CORONARY ANGIOGRAPHY N/A 03/29/2017   Procedure: LEFT HEART CATH AND CORONARY ANGIOGRAPHY;  Surgeon: Wellington Hampshire, MD;  Location: [facility];  Service: Cardiovascular;  Laterality: N/A;  . 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 ULTRASOUND GUIDANCE FOR VASCULAR ACCESS  03/29/2017   Procedure: Ultrasound Guidance For Vascular Access;  Surgeon:  Wellington Hampshire, MD;  Location: [facility];  Service: Cardiovascular;;   Family History  Problem Relation Age of Onset  . Cancer Mother        tumor on tonsil  . Stroke Maternal Grandfather   . Diabetes Neg Hx    Social History   Socioeconomic History  . Marital status: Married    Spouse name: Not on file  . Number of children: Not on file  . Years of education: Not on file  . Highest education level: Not on file  Occupational History  . Not on file  Social Needs  .  Financial resource strain: Not on file  . Food insecurity    Worry: Not on file    Inability: Not on file  . Transportation needs    Medical: Not on file    Non-medical: Not on file  Tobacco Use  . Smoking status: Former Smoker    Packs/day: 0.50    Years: 20.00    Pack years: 10.00    Types: Cigarettes    Quit date: 05/30/1978    Years since quitting: 40.5  . Smokeless tobacco: Never Used  Substance and Sexual Activity  . Alcohol use: No    Alcohol/week: 0.0 standard drinks  . Drug use: No  . Sexual activity: Not Currently  Lifestyle  . Physical activity    Days per week: Not on file    Minutes per session: Not on file  . Stress: Not on file  Relationships  . Social Herbalist on phone: Not on file    Gets together: Not on file    Attends religious service: Not on file    Active member of club or organization: Not on file    Attends meetings of clubs or organizations: Not on file    Relationship status: Not on file  Other Topics Concern  . Not on file  Social History Narrative   Regular exercise: yes   Caffeine use: very little    Outpatient Encounter Medications as of 11/23/2018  Medication Sig  . aspirin EC 81 MG tablet Take 81 mg by mouth daily.  Marland Kitchen atorvastatin (LIPITOR) 20 MG tablet Take 1 tablet (20 mg total) by mouth daily.  . carvedilol (COREG) 6.25 MG tablet Take 1 tablet by mouth twice daily  . cetirizine (ZYRTEC) 10 MG tablet Take 10 mg by mouth 2 (two) times daily.   . clobetasol cream (TEMOVATE) 8.34 % Apply 1 application topically 2 (two) times daily. To affected areas (Patient taking differently: Apply 1 application topically 2 (two) times daily as needed (itching/rash). )  . clopidogrel (PLAVIX) 75 MG tablet Take 1 tablet by mouth once daily (Patient taking differently: Take 75 mg by mouth daily. )  . diphenhydrAMINE (BENADRYL) 25 MG tablet Take 25 mg by mouth every 6 (six) hours as needed for allergies.   Marland Kitchen ezetimibe (ZETIA) 10 MG tablet Take  1 tablet (10 mg total) by mouth daily.  . fexofenadine (ALLEGRA) 180 MG tablet Take 180 mg by mouth daily as needed for allergies.   Marland Kitchen glucose blood (ONE TOUCH ULTRA TEST) test strip USE 1 STRIP TO CHECK GLUCOSE ONCE DAILY AS DIRECTED  . ibuprofen (ADVIL,MOTRIN) 200 MG tablet Take 200-400 mg by mouth every 8 (eight) hours as needed (for pain.).   Marland Kitchen LANCETS ULTRA THIN 30G MISC by Does not apply route. One Touch  . Multiple Vitamin (MULTIVITAMIN WITH MINERALS) TABS tablet Take 1 tablet by mouth daily.  Marland Kitchen  Multiple Vitamins-Minerals (AIRBORNE) TBEF Take 1 tablet by mouth daily as needed (for immune system support). Immune Health/Support  . nystatin cream (MYCOSTATIN) Apply 1 application topically 2 (two) times daily as needed for dry skin.  . repaglinide (PRANDIN) 2 MG tablet Take 1-2 tablets (2-4 mg total) by mouth 3 (three) times daily before meals.  . sacubitril-valsartan (ENTRESTO) 49-51 MG Take 1 tablet (49/51 mg) by mouth twice daily  . tetrahydrozoline 0.05 % ophthalmic solution Place 1-2 drops into both eyes 3 (three) times daily as needed (for dry/irritated eyes).  . Triamcinolone Acetonide (NASACORT ALLERGY 24HR NA) Place 2 sprays into the nose at bedtime as needed (allergies).   Marland Kitchen spironolactone (ALDACTONE) 25 MG tablet Take 1 tablet (25 mg total) by mouth daily.  . [DISCONTINUED] nebivolol (BYSTOLIC) 5 MG tablet Take 1 tablet (5 mg total) by mouth 2 (two) times daily.   No facility-administered encounter medications on file as of 11/23/2018.     Activities of Daily Living In your present state of health, do you have any difficulty performing the following activities: 11/23/2018 08/24/2018  Hearing? N N  Vision? N N  Difficulty concentrating or making decisions? N N  Walking or climbing stairs? N N  Dressing or bathing? N N  Doing errands, shopping? N N  Preparing Food and eating ? N -  Using the Toilet? N -  In the past six months, have you accidently leaked urine? N -  Do you have  problems with loss of bowel control? N -  Managing your Medications? N -  Managing your Finances? N -  Some recent data might be hidden    Patient Care Team: Tower, Wynelle Fanny, MD as PCP - General (Family Medicine) Rockey Situ, Kathlene November, MD as Consulting Physician (Cardiology) Oneta Rack, MD as Consulting Physician (Dermatology) Juanito Doom, MD as Consulting Physician (Pulmonary Disease)   Assessment:   This is a routine wellness examination for Horice.   Hearing Screening   125Hz  250Hz  500Hz  1000Hz  2000Hz  3000Hz  4000Hz  6000Hz  8000Hz   Right ear:           Left ear:           Vision Screening Comments: Vision exam in 2019 @ [place] and Dietary recommendations Current Exercise Habits: The patient does not participate in regular exercise at present, Exercise limited by: None identified  Goals    . Increase physical activity     Starting 11/23/2018, I will continue to exercise and do outdoor activities for 3-4 hours 5 days per week.        Fall Risk Fall Risk  11/23/2018 11/20/2017 11/15/2016 10/09/2015 06/09/2014  Falls in the past year? 0 No No No No   Depression Screen PHQ 2/9 Scores 11/23/2018 11/20/2017 11/15/2016 10/09/2015  PHQ - 2 Score 0 0 0 0  PHQ- 9 Score 0 0 - -    Cognitive Function MMSE - Mini Mental State Exam 11/23/2018 11/20/2017 11/15/2016 10/09/2015  Orientation to time 5 5 5 5   Orientation to Place 5 5 5 5   Registration 3 3 3 3   Attention/ Calculation 0 0 0 0  Recall 3 2 3 3   Recall-comments - unable to recall 1 of 3 words - -  Language- name 2 objects 0 0 0 0  Language- repeat 1 1 1 1   Language- follow 3 step command 0 3 3 3   Language- read & follow direction 0 0 0 0  Write a sentence 0  0 0 0  Copy design 0 0 0 0  Total score 17 19 20 20      PLEASE NOTE: A Mini-Cog screen was completed. Maximum score is 17. A value of 0 denotes this part of [facility] MMSE was not completed or the patient failed this part of the  Mini-Cog screening.   Mini-Cog Screening Orientation to Time - Max 5 pts Orientation to Place - Max 5 pts Registration - Max 3 pts Recall - Max 3 pts Language Repeat - Max 1 pts      Immunization History  Administered Date(s) Administered  . Influenza Split 05/16/2011, 02/16/2012  . Influenza, High Dose Seasonal PF 02/28/2017  . Influenza,inj,Quad PF,6+ Mos 03/05/2013, 03/10/2014, 05/20/2015, 03/30/2016, 05/10/2018  . Pneumococcal Conjugate-13 06/09/2014  . Pneumococcal Polysaccharide-23 07/15/2011  . Td 11/29/2017    Screening Tests Health Maintenance  Topic Date Due  . URINE MICROALBUMIN  11/26/2018 (Originally 11/21/2018)  . INFLUENZA VACCINE  12/29/2018  . OPHTHALMOLOGY EXAM  12/30/2018  . HEMOGLOBIN A1C  02/24/2019  . FOOT EXAM  05/16/2019  . TETANUS/TDAP  11/30/2027  . PNA vac Low Risk Adult  Completed      Plan:  I have personally reviewed, addressed, and noted the following in the patient's chart:  A. Medical and social history B. Use of alcohol, tobacco or illicit drugs  C. Current medications and supplements D. Functional ability and status E.  Nutritional status F.  Physical activity G. Advance directives H. List of other physicians I.  Hospitalizations, surgeries, and ER visits in previous 12 months J.  Vitals (unless it is a telemedicine encounter) K. Screenings to include cognitive, depression, hearing, vision (NOTE: hearing and vision screenings not completed in telemedicine encounter) L. Referrals and appointments   In addition, I have reviewed and discussed with patient certain preventive protocols, quality metrics, and best practice recommendations. A written personalized care plan for preventive services and recommendations were provided to patient.  With patient's permission, we connected on 11/23/18 at  9:30 AM EDT. Interactive audio and video telecommunications were attempted with patient. This attempt was unsuccessful due to patient having  technical difficulties OR patient did not have access to video capability.  Encounter was completed with audio only.  Two patient identifiers were used to ensure the encounter occurred with the correct person. Patient was in home and writer was in office.     Signed,   Lindell Noe, MHA, BS, RN Health Coach

## 2018-11-23 NOTE — Patient Instructions (Signed)
Ryan Morgan , Thank you for taking time to come for your Medicare Wellness Visit. I appreciate your ongoing commitment to your health goals. Please review the following plan we discussed and let me know if I can assist you in the future.   These are the goals we discussed: Goals    . Increase physical activity     Starting 11/23/2018, I will continue to exercise and do outdoor activities for 3-4 hours 5 days per week.        This is a list of the screening recommended for you and due dates:  Health Maintenance  Topic Date Due  . Urine Protein Check  11/26/2018*  . Flu Shot  12/29/2018  . Eye exam for diabetics  12/30/2018  . Hemoglobin A1C  02/24/2019  . Complete foot exam   05/16/2019  . Tetanus Vaccine  11/30/2027  . Pneumonia vaccines  Completed  *Topic was postponed. The date shown is not the original due date.   Preventive Care for Adults  A healthy lifestyle and preventive care can promote health and wellness. Preventive health guidelines for adults include the following key practices.  . A routine yearly physical is a good way to check with your health care provider about your health and preventive screening. It is a chance to share any concerns and updates on your health and to receive a thorough exam.  . Visit your dentist for a routine exam and preventive care every 6 months. Brush your teeth twice a day and floss once a day. Good oral hygiene prevents tooth decay and gum disease.  . The frequency of eye exams is based on your age, health, family medical history, use  of contact lenses, and other factors. Follow your health care provider's recommendations for frequency of eye exams.  . Eat a healthy diet. Foods like vegetables, fruits, whole grains, low-fat dairy products, and lean protein foods contain the nutrients you need without too many calories. Decrease your intake of foods high in solid fats, added sugars, and salt. Eat the right amount of calories for you. Get  information about a proper diet from your health care provider, if necessary.  . Regular physical exercise is one of the most important things you can do for your health. Most adults should get at least 150 minutes of moderate-intensity exercise (any activity that increases your heart rate and causes you to sweat) each week. In addition, most adults need muscle-strengthening exercises on 2 or more days a week.  Silver Sneakers may be a benefit available to you. To determine eligibility, you may visit the website: www.silversneakers.com or contact program at 312-142-2520 Mon-Fri between 8AM-8PM.   . Maintain a healthy weight. The body mass index (BMI) is a screening tool to identify possible weight problems. It provides an estimate of body fat based on height and weight. Your health care provider can find your BMI and can help you achieve or maintain a healthy weight.   For adults 20 years and older: ? A BMI below 18.5 is considered underweight. ? A BMI of 18.5 to 24.9 is normal. ? A BMI of 25 to 29.9 is considered overweight. ? A BMI of 30 and above is considered obese.   . Maintain normal blood lipids and cholesterol levels by exercising and minimizing your intake of saturated fat. Eat a balanced diet with plenty of fruit and vegetables. Blood tests for lipids and cholesterol should begin at age 52 and be repeated every 5 years. If your lipid  or cholesterol levels are high, you are over 50, or you are at high risk for heart disease, you may need your cholesterol levels checked more frequently. Ongoing high lipid and cholesterol levels should be treated with medicines if diet and exercise are not working.  . If you smoke, find out from your health care provider how to quit. If you do not use tobacco, please do not start.  . If you choose to drink alcohol, please do not consume more than 2 drinks per day. One drink is considered to be 12 ounces (355 mL) of beer, 5 ounces (148 mL) of wine, or 1.5  ounces (44 mL) of liquor.  . If you are 32-46 years old, ask your health care provider if you should take aspirin to prevent strokes.  . Use sunscreen. Apply sunscreen liberally and repeatedly throughout the day. You should seek shade when your shadow is shorter than you. Protect yourself by wearing long sleeves, pants, a wide-brimmed hat, and sunglasses year round, whenever you are outdoors.  . Once a month, do a whole body skin exam, using a mirror to look at the skin on your back. Tell your health care provider of new moles, moles that have irregular borders, moles that are larger than a pencil eraser, or moles that have changed in shape or color.

## 2018-11-25 ENCOUNTER — Telehealth: Payer: Self-pay | Admitting: Family Medicine

## 2018-11-25 DIAGNOSIS — I10 Essential (primary) hypertension: Secondary | ICD-10-CM

## 2018-11-25 DIAGNOSIS — E785 Hyperlipidemia, unspecified: Secondary | ICD-10-CM

## 2018-11-25 DIAGNOSIS — E11311 Type 2 diabetes mellitus with unspecified diabetic retinopathy with macular edema: Secondary | ICD-10-CM

## 2018-11-25 DIAGNOSIS — Z125 Encounter for screening for malignant neoplasm of prostate: Secondary | ICD-10-CM

## 2018-11-25 NOTE — Telephone Encounter (Signed)
-----   Message from Cloyd Stagers, RT sent at 11/23/2018 12:44 PM EDT ----- Regarding: Lab Orders for Monday 6.29.2020 Please place lab orders for Monday 6.29.2020, office visit for physical on Tuesday 7.7.2020 Thank you, Dyke Maes RT(R)

## 2018-11-26 ENCOUNTER — Other Ambulatory Visit: Payer: Self-pay | Admitting: Endocrinology

## 2018-11-26 ENCOUNTER — Other Ambulatory Visit: Payer: Self-pay

## 2018-11-26 ENCOUNTER — Other Ambulatory Visit (INDEPENDENT_AMBULATORY_CARE_PROVIDER_SITE_OTHER): Payer: Medicare HMO

## 2018-11-26 ENCOUNTER — Other Ambulatory Visit: Payer: Self-pay | Admitting: Cardiovascular Disease

## 2018-11-26 DIAGNOSIS — E785 Hyperlipidemia, unspecified: Secondary | ICD-10-CM | POA: Diagnosis not present

## 2018-11-26 DIAGNOSIS — I10 Essential (primary) hypertension: Secondary | ICD-10-CM | POA: Diagnosis not present

## 2018-11-26 DIAGNOSIS — E11311 Type 2 diabetes mellitus with unspecified diabetic retinopathy with macular edema: Secondary | ICD-10-CM | POA: Diagnosis not present

## 2018-11-26 DIAGNOSIS — Z125 Encounter for screening for malignant neoplasm of prostate: Secondary | ICD-10-CM

## 2018-11-26 LAB — CBC WITH DIFFERENTIAL/PLATELET
Basophils Absolute: 0 10*3/uL (ref 0.0–0.1)
Basophils Relative: 0.8 % (ref 0.0–3.0)
Eosinophils Absolute: 0.1 10*3/uL (ref 0.0–0.7)
Eosinophils Relative: 2 % (ref 0.0–5.0)
HCT: 38.3 % — ABNORMAL LOW (ref 39.0–52.0)
Hemoglobin: 13.1 g/dL (ref 13.0–17.0)
Lymphocytes Relative: 29.7 % (ref 12.0–46.0)
Lymphs Abs: 1 10*3/uL (ref 0.7–4.0)
MCHC: 34.2 g/dL (ref 30.0–36.0)
MCV: 94 fl (ref 78.0–100.0)
Monocytes Absolute: 0.3 10*3/uL (ref 0.1–1.0)
Monocytes Relative: 9.5 % (ref 3.0–12.0)
Neutro Abs: 2 10*3/uL (ref 1.4–7.7)
Neutrophils Relative %: 58 % (ref 43.0–77.0)
Platelets: 175 10*3/uL (ref 150.0–400.0)
RBC: 4.07 Mil/uL — ABNORMAL LOW (ref 4.22–5.81)
RDW: 13.6 % (ref 11.5–15.5)
WBC: 3.5 10*3/uL — ABNORMAL LOW (ref 4.0–10.5)

## 2018-11-26 LAB — HEMOGLOBIN A1C: Hgb A1c MFr Bld: 8.4 % — ABNORMAL HIGH (ref 4.6–6.5)

## 2018-11-26 LAB — PSA, MEDICARE: PSA: 1 ng/ml (ref 0.10–4.00)

## 2018-11-26 NOTE — Progress Notes (Signed)
I reviewed health advisor's note, was available for consultation, and agree with documentation and plan.  

## 2018-11-27 LAB — LIPID PANEL
Cholesterol: 99 mg/dL (ref 0–200)
HDL: 38.1 mg/dL — ABNORMAL LOW (ref >39.00)
LDL Cholesterol: 40 mg/dL (ref 0–99)
NonHDL: 60.43
Total CHOL/HDL Ratio: 3
Triglycerides: 101 mg/dL (ref 0.0–149.0)
VLDL: 20.2 mg/dL (ref 0.0–40.0)

## 2018-11-27 LAB — COMPREHENSIVE METABOLIC PANEL
ALT: 13 U/L (ref 0–53)
AST: 12 U/L (ref 0–37)
Albumin: 3.9 g/dL (ref 3.5–5.2)
Alkaline Phosphatase: 71 U/L (ref 39–117)
BUN: 9 mg/dL (ref 6–23)
CO2: 29 mEq/L (ref 19–32)
Calcium: 8.5 mg/dL (ref 8.4–10.5)
Chloride: 104 mEq/L (ref 96–112)
Creatinine, Ser: 0.84 mg/dL (ref 0.40–1.50)
GFR: 88.77 mL/min (ref >60.00)
Glucose, Bld: 156 mg/dL — ABNORMAL HIGH (ref 70–99)
Potassium: 4.1 mEq/L (ref 3.5–5.1)
Sodium: 141 mEq/L (ref 135–145)
Total Bilirubin: 0.5 mg/dL (ref 0.2–1.2)
Total Protein: 5.9 g/dL — ABNORMAL LOW (ref 6.0–8.3)

## 2018-11-27 LAB — MICROALBUMIN / CREATININE URINE RATIO
Creatinine,U: 182.5 mg/dL
Microalb Creat Ratio: 1 mg/g (ref 0.0–30.0)
Microalb, Ur: 1.8 mg/dL (ref 0.0–1.9)

## 2018-12-04 ENCOUNTER — Ambulatory Visit (INDEPENDENT_AMBULATORY_CARE_PROVIDER_SITE_OTHER): Payer: Medicare HMO | Admitting: Family Medicine

## 2018-12-04 ENCOUNTER — Other Ambulatory Visit: Payer: Self-pay | Admitting: Family Medicine

## 2018-12-04 ENCOUNTER — Other Ambulatory Visit: Payer: Self-pay | Admitting: Cardiovascular Disease

## 2018-12-04 ENCOUNTER — Encounter: Payer: Self-pay | Admitting: Family Medicine

## 2018-12-04 ENCOUNTER — Other Ambulatory Visit: Payer: Self-pay

## 2018-12-04 VITALS — BP 124/76 | HR 66 | Temp 96.7°F | Ht 69.0 in | Wt 227.4 lb

## 2018-12-04 DIAGNOSIS — E7800 Pure hypercholesterolemia, unspecified: Secondary | ICD-10-CM

## 2018-12-04 DIAGNOSIS — Z125 Encounter for screening for malignant neoplasm of prostate: Secondary | ICD-10-CM | POA: Diagnosis not present

## 2018-12-04 DIAGNOSIS — M3130 Wegener's granulomatosis without renal involvement: Secondary | ICD-10-CM | POA: Diagnosis not present

## 2018-12-04 DIAGNOSIS — Z0000 Encounter for general adult medical examination without abnormal findings: Secondary | ICD-10-CM

## 2018-12-04 DIAGNOSIS — I25118 Atherosclerotic heart disease of native coronary artery with other forms of angina pectoris: Secondary | ICD-10-CM

## 2018-12-04 DIAGNOSIS — I10 Essential (primary) hypertension: Secondary | ICD-10-CM

## 2018-12-04 DIAGNOSIS — E11311 Type 2 diabetes mellitus with unspecified diabetic retinopathy with macular edema: Secondary | ICD-10-CM

## 2018-12-04 DIAGNOSIS — M8580 Other specified disorders of bone density and structure, unspecified site: Secondary | ICD-10-CM

## 2018-12-04 DIAGNOSIS — Z1211 Encounter for screening for malignant neoplasm of colon: Secondary | ICD-10-CM | POA: Diagnosis not present

## 2018-12-04 NOTE — Assessment & Plan Note (Signed)
Discussed how this problem influences overall health and the risks it imposes  Reviewed plan for weight loss with lower calorie diet (via better food choices and also portion control or program like weight watchers) and exercise building up to or more than 30 minutes 5 days per week including some aerobic activity    

## 2018-12-04 NOTE — Progress Notes (Signed)
Subjective:    Patient ID: Ryan Morgan, male    DOB: [birth year], 78 y.o.   MRN: [id number]  HPI Here for health maintenance exam and to review chronic medical problems    Weight  Wt Readings from Last 3 Encounters:  12/04/18 227 lb 6 oz (103.1 kg)  08/27/18 231 lb 6 oz (105 kg)  08/25/18 229 lb 12.8 oz (104.2 kg)  down 4 lb  Has been exercising some - stationary bike at home / some work outside  Diet - good days and bad days (eating some frozen foods that are not great)  33.58 kg/m   Being good and avoiding social exposure   amw was 6/26 No concerns or gaps  microalb was ordered  dexa 7/18 -BMD in the normal range  H/o osteopenia / on and off prednisone   Colonoscopy 3/16  That showed a very small polyp  Pt is unsure about path or rec for f/u -we sent for the report   Prostate health Lab Results  Component Value Date   PSA 1.00 11/26/2018   PSA 0.98 11/20/2017   PSA 0.79 11/10/2016   nocturia is 0-1 times Flow is a bit slower at times -with age   HTN in setting of CAD and isch cardiomyopathy  bp is stable today  No cp or palpitations or headaches or edema  No side effects to medicines  BP Readings from Last 3 Encounters:  12/04/18 124/76  08/27/18 124/78  08/25/18 135/74     Wegeners granulomatosis -with ANCA vasculitis  Seen at [facility]- renal  Pending a test result right now  Breathing is better lately  No prednisone in 3 week   Hand rash is doing better also - thinks it was a peanut allergy    DM2 Sees endocrinology Lab Results  Component Value Date   HGBA1C 8.4 (H) 11/26/2018  this is up from 7.6  He was recently inst to ind repaglinide when he is on more prednisone and watch  glucose readings  Was not taking the medication right for a while- now back on three times daily (on prednisone 4 mg three times daily) when not on prednisone he takes 2 mg tid  Has f/u planned  Lab Results  Component Value Date   MICROALBUR 1.8 11/26/2018    Cannot take  ace due to resp issues On statin   Hyperlipidemia Lab Results  Component Value Date   CHOL 99 11/26/2018   CHOL 99 11/20/2017   CHOL 165 05/17/2017   Lab Results  Component Value Date   HDL 38.10 (L) 11/26/2018   HDL 41.80 11/20/2017   HDL 36.40 (L) 05/17/2017   Lab Results  Component Value Date   LDLCALC 40 11/26/2018   LDLCALC 38 11/20/2017   LDLCALC 46 11/10/2016   Lab Results  Component Value Date   TRIG 101.0 11/26/2018   TRIG 96.0 11/20/2017   TRIG 391.0 (H) 05/17/2017   Lab Results  Component Value Date   CHOLHDL 3 11/26/2018   CHOLHDL 2 11/20/2017   CHOLHDL 5 05/17/2017   Lab Results  Component Value Date   LDLDIRECT 100.0 05/17/2017   LDLDIRECT 69.0 10/09/2015   LDLDIRECT 65.7 08/13/2012   Taking atorvastatin 20 mg and zetia  Good control with LDL of 40  HDL is a little low   Lab Results  Component Value Date   WBC 3.5 (L) 11/26/2018   HGB 13.1 11/26/2018   HCT 38.3 (L) 11/26/2018   MCV 94.0 11/26/2018  PLT 175.0 11/26/2018    Lab Results  Component Value Date   CREATININE 0.84 11/26/2018   BUN 9 11/26/2018   NA 141 11/26/2018   K 4.1 11/26/2018   CL 104 11/26/2018   CO2 29 11/26/2018   Lab Results  Component Value Date   ALT 13 11/26/2018   AST 12 11/26/2018   ALKPHOS 71 11/26/2018   BILITOT 0.5 11/26/2018    Glucose 156  In mid 120s all week   Patient Active Problem List   Diagnosis Date Noted  . Ischemic cardiomyopathy 09/10/2018  . Near syncope 08/25/2018  . Hip joint effusion, left 05/03/2018  . Laceration of arm 12/04/2017  . Current chronic use of systemic steroids 11/15/2016  . Routine general medical examination at a health care facility 11/09/2015  . Hearing loss 11/09/2015  . History of colonic polyps 11/09/2015  . Rash of hands 03/10/2015  . Colon cancer screening 06/09/2014  . Blepharitis, bilateral 06/02/2014  . Shortness of breath 03/10/2014  . Benign paroxysmal positional vertigo 12/14/2013  . Bursitis of  right hip 10/17/2013  . Coronary artery disease of native artery of native heart with stable angina pectoris ([place]) 03/19/2013  . Encounter for Medicare annual wellness exam 03/05/2013  . 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 Osteopenia 03/05/2013  . Morbid obesity ([place]) 07/15/2011  . Prostate cancer screening 07/10/2011  . Cough 12/23/2010  . Hypertension 08/30/2010  . Hyperlipemia 08/30/2010  . Diabetes mellitus type 2 with retinopathy ([place]) 08/30/2010  . Wegener's granulomatosis ([place]) 08/30/2010   Past Medical History:  Diagnosis Date  . Coronary artery disease    a. prior LAD stenting 2000; b. [place] 2003 LAD 40, D1 50; c. Lexiscan 02/2017: large defect of severe severity in the basal inferoseptal, basal inferior, mid inferoseptal, mid inferior and apical inferior location, high risk; d. LHC 03/29/17: LM nl, patent LAD stent w/ 40% ISR, D1 100,  OM1 90, RPDA 100 w/ L-R collats, post atrio 80% s/p PCI/DES    . Diabetes mellitus   . Hyperlipidemia   . Hypertension   . Ischemic cardiomyopathy    a. TTE 02/2017: EF 30-35%, diffuse HK, normal LV diastolic function parameters, mild AI/MR, RV systolic function normal, PASP normal  . Lung nodule    a. s/p prior thoracotomy  . Morbid obesity ([place])   . Wegener's granulomatosis ([place]) 2007   Past Surgical History:  Procedure Laterality Date  . BASAL CELL CARCINOMA EXCISION     removed from face x 3  . CARDIAC CATHETERIZATION    . CARDIAC SURGERY  2000   stent in heart  . CORONARY ANGIOPLASTY  09/22/1998   s/p stent placement; [facility] 3.0 x 18 mm Ref [id number]  . CORONARY STENT INTERVENTION N/A 03/29/2017   Procedure: CORONARY STENT INTERVENTION;  Surgeon: Wellington Hampshire, MD;  Location: [facility];  Service: Cardiovascular;  Laterality: N/A;  . LEFT HEART CATH AND CORONARY ANGIOGRAPHY N/A 03/29/2017   Procedure: LEFT HEART CATH AND CORONARY ANGIOGRAPHY;  Surgeon: Wellington Hampshire, MD;  Location: [facility];  Service: Cardiovascular;  Laterality: N/A;  . 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 ULTRASOUND GUIDANCE FOR VASCULAR ACCESS  03/29/2017   Procedure: Ultrasound Guidance For Vascular Access;  Surgeon: Wellington Hampshire, MD;  Location: [facility];  Service: Cardiovascular;;   Social History   Tobacco Use  . Smoking status: Former Smoker    Packs/day:  0.50    Years: 20.00    Pack years: 10.00    Types: Cigarettes    Quit date: 05/30/1978    Years since quitting: 40.5  . Smokeless tobacco: Never Used  Substance Use Topics  . Alcohol use: No    Alcohol/week: 0.0 standard drinks  . Drug use: No   Family History  Problem Relation Age of Onset  . Cancer Mother        tumor on tonsil  . Stroke Maternal Grandfather   . Diabetes Neg Hx    Allergies  Allergen Reactions  . Metformin And Related Itching  . Benicar Hct [Olmesartan Medoxomil-Hctz] Itching    Painful and itchy knots on palms of hands  . Bystolic [Nebivolol Hcl] Other (See Comments)    headache  . Dapsone Hives  . Lovaza [Omega-3-Acid Ethyl Esters] Other (See Comments)    Unknown reaction  . Niaspan [Niacin Er] Other (See Comments)    flushing  . Peanut-Containing Drug Products     Rash on hands from peanuts  . Septra [Bactrim] Hives   Current Outpatient Medications on File Prior to Visit  Medication Sig Dispense Refill  . aspirin EC 81 MG tablet Take 81 mg by mouth daily.    . cetirizine (ZYRTEC) 10 MG tablet Take 10 mg by mouth 2 (two) times daily.     . clobetasol cream (TEMOVATE) 4.65 % Apply 1 application topically 2 (two) times daily. To affected areas (Patient taking differently: Apply 1 application topically 2 (two) times daily as needed (itching/rash). ) 30 g 1  . clopidogrel (PLAVIX) 75 MG tablet Take 1 tablet by mouth once daily 90 tablet 0  . glucose blood (ONE TOUCH ULTRA TEST) test strip USE 1 STRIP TO CHECK GLUCOSE ONCE DAILY AS DIRECTED 100 each 0  .  LANCETS ULTRA THIN 30G MISC by Does not apply route. One Touch    . Multiple Vitamin (MULTIVITAMIN WITH MINERALS) TABS tablet Take 1 tablet by mouth daily.    . Multiple Vitamins-Minerals (AIRBORNE) TBEF Take 1 tablet by mouth daily as needed (for immune system support). Immune Health/Support    . nystatin cream (MYCOSTATIN) Apply 1 application topically 2 (two) times daily as needed for dry skin.    . predniSONE (DELTASONE) 10 MG tablet TAKE 1 TO 2 TABLETS BY MOUTH ONCE DAILY AS NEEDED (FOR WEGENER S GRANULOMATOSIS)    . repaglinide (PRANDIN) 2 MG tablet TAKE 1 TABLET BY MOUTH THREE TIMES DAILY BEFORE MEAL(S) 270 tablet 0  . tetrahydrozoline 0.05 % ophthalmic solution Place 1-2 drops into both eyes 3 (three) times daily as needed (for dry/irritated eyes).    . Triamcinolone Acetonide (NASACORT ALLERGY 24HR NA) Place 2 sprays into the nose at bedtime as needed (allergies).     Marland Kitchen spironolactone (ALDACTONE) 25 MG tablet Take 1 tablet (25 mg total) by mouth daily. 90 tablet 0  . [DISCONTINUED] nebivolol (BYSTOLIC) 5 MG tablet Take 1 tablet (5 mg total) by mouth 2 (two) times daily. 180 tablet 3   No current facility-administered medications on file prior to visit.      Review of Systems  Constitutional: Positive for fatigue. Negative for activity change, appetite change, fever and unexpected weight change.  HENT: Negative for congestion, rhinorrhea, sore throat and trouble swallowing.   Eyes: Negative for pain, redness, itching and visual disturbance.  Respiratory: Positive for wheezing. Negative for cough, chest tightness, shortness of breath and stridor.        Breathing is good today  Cardiovascular: Negative for chest pain and palpitations.  Gastrointestinal: Negative for abdominal pain, blood in stool, constipation, diarrhea and nausea.  Endocrine: Negative for cold intolerance, heat intolerance, polydipsia and polyuria.  Genitourinary: Negative for difficulty urinating, dysuria, frequency  and urgency.  Musculoskeletal: Positive for arthralgias. Negative for joint swelling and myalgias.  Skin: Negative for pallor and rash.       No rash on palms today (quit peanuts)   Neurological: Negative for dizziness, tremors, weakness, numbness and headaches.  Hematological: Negative for adenopathy. Does not bruise/bleed easily.  Psychiatric/Behavioral: Negative for decreased concentration and dysphoric mood. The patient is not nervous/anxious.        Objective:   Physical Exam Constitutional:      General: He is not in acute distress.    Appearance: Normal appearance. He is well-developed. He is obese. He is not ill-appearing or diaphoretic.  HENT:     Head: Normocephalic and atraumatic.     Right Ear: Tympanic membrane, ear canal and external ear normal.     Left Ear: Tympanic membrane, ear canal and external ear normal.     Nose: Nose normal.     Mouth/Throat:     Mouth: Mucous membranes are moist.     Pharynx: Oropharynx is clear. No posterior oropharyngeal erythema.  Eyes:     General: No scleral icterus.       Right eye: No discharge.        Left eye: No discharge.     Conjunctiva/sclera: Conjunctivae normal.     Pupils: Pupils are equal, round, and reactive to light.  Neck:     Musculoskeletal: Normal range of motion and neck supple. No muscular tenderness.     Thyroid: No thyromegaly.     Vascular: No carotid bruit or JVD.  Cardiovascular:     Rate and Rhythm: Normal rate and regular rhythm.     Pulses: Normal pulses.     Heart sounds: Normal heart sounds. No gallop.   Pulmonary:     Effort: Pulmonary effort is normal. No respiratory distress.     Breath sounds: Normal breath sounds. No wheezing.  Chest:     Chest wall: No tenderness.  Abdominal:     General: Bowel sounds are normal. There is no distension or abdominal bruit.     Palpations: Abdomen is soft. There is no mass.     Tenderness: There is no abdominal tenderness. There is no left CVA tenderness.   Musculoskeletal:        General: No tenderness.     Right lower leg: No edema.     Left lower leg: No edema.  Lymphadenopathy:     Cervical: No cervical adenopathy.  Skin:    General: Skin is warm and dry.     Coloration: Skin is not pale.     Findings: No erythema or rash.     Comments: Solar lentigines diffusely   Neurological:     Mental Status: He is alert.     Cranial Nerves: No cranial nerve deficit.     Motor: No abnormal muscle tone.     Coordination: Coordination normal.     Gait: Gait normal.     Deep Tendon Reflexes: Reflexes are normal and symmetric. Reflexes normal.  Psychiatric:        Mood and Affect: Mood normal.        Cognition and Memory: Cognition and memory normal.           Assessment & Plan:  Problem List Items Addressed This Visit      Cardiovascular and Mediastinum   Hypertension    bp in fair control at this time  BP Readings from Last 1 Encounters:  12/04/18 124/76   No changes needed Most recent labs reviewed  Disc lifstyle change with low sodium diet and exercise        Wegener's granulomatosis ([place])    Continues f/u at [facility]  On/off prednisone       Coronary artery disease of native artery of native heart with stable angina pectoris ([facility])    No recent clinical changes  Followed by cardiology        Endocrine   Diabetes mellitus type 2 with retinopathy ([place])    In the setting of prednisone on /off fore Wegener's dz  Followed by endocrinology  Eye care utd  Recently inc prandin A1C high Disc dietary changes  Taking statin  No ace due to resp issues         Musculoskeletal and Integument   Osteopenia    Last dexa -nl BMD No falls or fx Enc strongly to start taking vit D Also exercise         Other   Hyperlipemia    Disc goals for lipids and reasons to control them Rev last labs with pt Rev low sat fat diet in detail  In setting of CAD On atorvastatin and zetia - LDL is controlled  Disc exercise to help inc  HDL      Prostate cancer screening    No clinical changes Lab Results  Component Value Date   PSA 1.00 11/26/2018   PSA 0.98 11/20/2017   PSA 0.79 11/10/2016   Reassuring       Morbid obesity ([place])    Discussed how this problem influences overall health and the risks it imposes  Reviewed plan for weight loss with lower calorie diet (via better food choices and also portion control or program like weight watchers) and exercise building up to or more than 30 minutes 5 days per week including some aerobic activity         Colon cancer screening    Colonoscopy 2016 Unsure of f/u needed Sent for report       Routine general medical examination at a health care facility - Primary    Reviewed health habits including diet and exercise and skin cancer prevention Reviewed appropriate screening tests for age  Also reviewed health mt list, fam hx and immunization status , as well as social and family history   See HPI amw rev  Labs rev Sent for last colonoscopy report  Continue endo f/u for DM Strongly enc healthy diet and exercise

## 2018-12-04 NOTE — Assessment & Plan Note (Signed)
Disc goals for lipids and reasons to control them Rev last labs with pt Rev low sat fat diet in detail  In setting of CAD On atorvastatin and zetia - LDL is controlled  Disc exercise to help inc HDL

## 2018-12-04 NOTE — Assessment & Plan Note (Signed)
bp in fair control at this time  BP Readings from Last 1 Encounters:  12/04/18 124/76   No changes needed Most recent labs reviewed  Disc lifstyle change with low sodium diet and exercise

## 2018-12-04 NOTE — Assessment & Plan Note (Signed)
In the setting of prednisone on /off fore Wegener's dz  Followed by endocrinology  Eye care utd  Recently inc prandin A1C high Disc dietary changes  Taking statin  No ace due to resp issues

## 2018-12-04 NOTE — Assessment & Plan Note (Signed)
Last dexa -nl BMD No falls or fx Enc strongly to start taking vit D Also exercise

## 2018-12-04 NOTE — Assessment & Plan Note (Signed)
No recent clinical changes  Followed by cardiology

## 2018-12-04 NOTE — Assessment & Plan Note (Signed)
Colonoscopy 2016 Unsure of f/u needed Sent for report

## 2018-12-04 NOTE — Assessment & Plan Note (Signed)
No clinical changes Lab Results  Component Value Date   PSA 1.00 11/26/2018   PSA 0.98 11/20/2017   PSA 0.79 11/10/2016   Reassuring

## 2018-12-04 NOTE — Assessment & Plan Note (Signed)
Reviewed health habits including diet and exercise and skin cancer prevention Reviewed appropriate screening tests for age  Also reviewed health mt list, fam hx and immunization status , as well as social and family history   See HPI amw rev  Labs rev Sent for last colonoscopy report  Continue endo f/u for DM Strongly enc healthy diet and exercise

## 2018-12-04 NOTE — Patient Instructions (Addendum)
For bone health please take 2000 iu of vitamin D3 over the counter every day   Make sure you check glucose at times when not fasting  Try to get most of your carbohydrates from produce (with the exception of white potatoes)  Eat less bread/pasta/rice/snack foods/cereals/sweets and other items from the middle of the grocery store (processed carbs)  Stay as active as you can safely be  Labs are fairly stable but A1C is up

## 2018-12-04 NOTE — Assessment & Plan Note (Signed)
Continues f/u at [facility] prednisone

## 2018-12-05 DIAGNOSIS — T1502XA Foreign body in cornea, left eye, initial encounter: Secondary | ICD-10-CM | POA: Diagnosis not present

## 2018-12-11 ENCOUNTER — Telehealth: Payer: Self-pay | Admitting: Gastroenterology

## 2018-12-11 NOTE — Telephone Encounter (Signed)
Hi Dr. Silverio Decamp, we have received a referral from pt's PCP for a repeat colon, Pt had last colon in 2016. We have received records and they will placed on your desk for review. Please advise on scheduling. Thank you.

## 2018-12-17 ENCOUNTER — Ambulatory Visit
Admission: RE | Admit: 2018-12-17 | Discharge: 2018-12-17 | Disposition: A | Discharge status: Home / Self Care | Payer: Medicare HMO | Source: Ambulatory Visit | Attending: Family Medicine | Admitting: Family Medicine

## 2018-12-17 ENCOUNTER — Other Ambulatory Visit: Payer: Self-pay

## 2018-12-17 ENCOUNTER — Ambulatory Visit (INDEPENDENT_AMBULATORY_CARE_PROVIDER_SITE_OTHER): Payer: Medicare HMO | Admitting: Family Medicine

## 2018-12-17 ENCOUNTER — Encounter: Payer: Self-pay | Admitting: Family Medicine

## 2018-12-17 DIAGNOSIS — M79661 Pain in right lower leg: Secondary | ICD-10-CM | POA: Diagnosis not present

## 2018-12-17 DIAGNOSIS — M79604 Pain in right leg: Secondary | ICD-10-CM | POA: Diagnosis not present

## 2018-12-17 NOTE — Patient Instructions (Signed)
I want to get an ultrasound of your right leg to rule out a clot  Use a warm compress or ice on painful area - whichever helps more  Elevate leg if it helps Further plan to follow imaging

## 2018-12-17 NOTE — Progress Notes (Signed)
Subjective:    Patient ID: Ryan Morgan, male    DOB: 18-Dec-1940, 78 y.o.   MRN: [id number]  HPI Here for R leg and foot pain    Wt Readings from Last 3 Encounters:  12/04/18 227 lb 6 oz (103.1 kg)  08/27/18 231 lb 6 oz (105 kg)  08/25/18 229 lb 12.8 oz (104.2 kg)   33.58 kg/m   Symptoms started this weekend  Pain is a little improved today   Started with pain in lateral upper outer leg  Then radiated down to his ankle Then it switched to medial leg- up to groin  Also in R buttock   No back pain  No trauma No falls No injury   No swelling /no heat or redness  No skin injury  No rash   Takes plavix and aspirin  Has never had a blood clot   Not on prednisone right now  Has wegener's granulomatosis (when first diag he was on crutches and lost hearing temporarily)    Patient Active Problem List   Diagnosis Date Noted  . Right leg pain 12/17/2018  . Ischemic cardiomyopathy 09/10/2018  . Near syncope 08/25/2018  . Hip joint effusion, left 05/03/2018  . Laceration of arm 12/04/2017  . Current chronic use of systemic steroids 11/15/2016  . Routine general medical examination at a health care facility 11/09/2015  . Hearing loss 11/09/2015  . History of colonic polyps 11/09/2015  . Rash of hands 03/10/2015  . Colon cancer screening 06/09/2014  . Blepharitis, bilateral 06/02/2014  . Shortness of breath 03/10/2014  . Benign paroxysmal positional vertigo 12/14/2013  . Bursitis of right hip 10/17/2013  . Coronary artery disease of native artery of native heart with stable angina pectoris ([place]) 03/19/2013  . Encounter for Medicare annual wellness exam 03/05/2013  . 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 Osteopenia 03/05/2013  . Morbid obesity ([place]) 07/15/2011  . Prostate cancer screening 07/10/2011  . Cough 12/23/2010  . Hypertension 08/30/2010  . Hyperlipemia 08/30/2010  . Diabetes mellitus type 2 with retinopathy ([place]) 08/30/2010   . Wegener's granulomatosis ([place]) 08/30/2010   Past Medical History:  Diagnosis Date  . Coronary artery disease    a. prior LAD stenting 2000; b. [place] 2003 LAD 40, D1 50; c. Lexiscan 02/2017: large defect of severe severity in the basal inferoseptal, basal inferior, mid inferoseptal, mid inferior and apical inferior location, high risk; d. LHC 03/29/17: LM nl, patent LAD stent w/ 40% ISR, D1 100,  OM1 90, RPDA 100 w/ L-R collats, post atrio 80% s/p PCI/DES    . Diabetes mellitus   . Hyperlipidemia   . Hypertension   . Ischemic cardiomyopathy    a. TTE 02/2017: EF 30-35%, diffuse HK, normal LV diastolic function parameters, mild AI/MR, RV systolic function normal, PASP normal  . Lung nodule    a. s/p prior thoracotomy  . Morbid obesity ([place])   . Wegener's granulomatosis ([place]) 2007   Past Surgical History:  Procedure Laterality Date  . BASAL CELL CARCINOMA EXCISION     removed from face x 3  . CARDIAC CATHETERIZATION    . CARDIAC SURGERY  2000   stent in heart  . CORONARY ANGIOPLASTY  09/22/1998   s/p stent placement; Tristar 3.0 x 18 mm Ref [id number]  . CORONARY STENT INTERVENTION N/A 03/29/2017   Procedure: CORONARY STENT INTERVENTION;  Surgeon: Wellington Hampshire, MD;  Location: [facility];  Service: Cardiovascular;  Laterality: N/A;  .  LEFT HEART CATH AND CORONARY ANGIOGRAPHY N/A 03/29/2017   Procedure: LEFT HEART CATH AND CORONARY ANGIOGRAPHY;  Surgeon: Wellington Hampshire, MD;  Location: [facility];  Service: Cardiovascular;  Laterality: N/A;  . 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 ULTRASOUND GUIDANCE FOR VASCULAR ACCESS  03/29/2017   Procedure: Ultrasound Guidance For Vascular Access;  Surgeon: Wellington Hampshire, MD;  Location: [facility];  Service: Cardiovascular;;   Social History   Tobacco Use  . Smoking status: Former Smoker    Packs/day: 0.50    Years: 20.00    Pack years: 10.00    Types: Cigarettes    Quit date: 05/30/1978     Years since quitting: 40.5  . Smokeless tobacco: Never Used  Substance Use Topics  . Alcohol use: No    Alcohol/week: 0.0 standard drinks  . Drug use: No   Family History  Problem Relation Age of Onset  . Cancer Mother        tumor on tonsil  . Stroke Maternal Grandfather   . Diabetes Neg Hx    Allergies  Allergen Reactions  . Metformin And Related Itching  . Benicar Hct [Olmesartan Medoxomil-Hctz] Itching    Painful and itchy knots on palms of hands  . Bystolic [Nebivolol Hcl] Other (See Comments)    headache  . Dapsone Hives  . Lovaza [Omega-3-Acid Ethyl Esters] Other (See Comments)    Unknown reaction  . Niaspan [Niacin Er] Other (See Comments)    flushing  . Peanut-Containing Drug Products     Rash on hands from peanuts  . Septra [Bactrim] Hives   Current Outpatient Medications on File Prior to Visit  Medication Sig Dispense Refill  . aspirin EC 81 MG tablet Take 81 mg by mouth daily.    Marland Kitchen atorvastatin (LIPITOR) 20 MG tablet Take 1 tablet by mouth once daily 90 tablet 3  . carvedilol (COREG) 6.25 MG tablet Take 1 tablet by mouth twice daily 180 tablet 0  . cetirizine (ZYRTEC) 10 MG tablet Take 10 mg by mouth 2 (two) times daily.     . clobetasol cream (TEMOVATE) 3.78 % Apply 1 application topically 2 (two) times daily. To affected areas (Patient taking differently: Apply 1 application topically 2 (two) times daily as needed (itching/rash). ) 30 g 1  . clopidogrel (PLAVIX) 75 MG tablet Take 1 tablet by mouth once daily 90 tablet 0  . ENTRESTO 49-51 MG Take 1 tablet by mouth twice daily 180 tablet 0  . ezetimibe (ZETIA) 10 MG tablet Take 1 tablet by mouth once daily 90 tablet 0  . glucose blood (ONE TOUCH ULTRA TEST) test strip USE 1 STRIP TO CHECK GLUCOSE ONCE DAILY AS DIRECTED 100 each 0  . LANCETS ULTRA THIN 30G MISC by Does not apply route. One Touch    . Multiple Vitamin (MULTIVITAMIN WITH MINERALS) TABS tablet Take 1 tablet by mouth daily.    . Multiple  Vitamins-Minerals (AIRBORNE) TBEF Take 1 tablet by mouth daily as needed (for immune system support). Immune Health/Support    . nystatin cream (MYCOSTATIN) Apply 1 application topically 2 (two) times daily as needed for dry skin.    . predniSONE (DELTASONE) 10 MG tablet TAKE 1 TO 2 TABLETS BY MOUTH ONCE DAILY AS NEEDED (FOR WEGENER S GRANULOMATOSIS)    . repaglinide (PRANDIN) 2 MG tablet TAKE 1 TABLET BY MOUTH THREE TIMES DAILY BEFORE MEAL(S) 270 tablet 0  . tetrahydrozoline 0.05 % ophthalmic  solution Place 1-2 drops into both eyes 3 (three) times daily as needed (for dry/irritated eyes).    . Triamcinolone Acetonide (NASACORT ALLERGY 24HR NA) Place 2 sprays into the nose at bedtime as needed (allergies).     Marland Kitchen spironolactone (ALDACTONE) 25 MG tablet Take 1 tablet (25 mg total) by mouth daily. 90 tablet 0  . [DISCONTINUED] nebivolol (BYSTOLIC) 5 MG tablet Take 1 tablet (5 mg total) by mouth 2 (two) times daily. 180 tablet 3   No current facility-administered medications on file prior to visit.     Review of Systems  Constitutional: Negative for activity change, appetite change, fatigue, fever and unexpected weight change.  HENT: Negative for congestion, rhinorrhea, sore throat and trouble swallowing.   Eyes: Negative for pain, redness, itching and visual disturbance.  Respiratory: Negative for cough, chest tightness, shortness of breath and wheezing.   Cardiovascular: Negative for chest pain, palpitations and leg swelling.  Gastrointestinal: Negative for abdominal pain, blood in stool, constipation, diarrhea and nausea.  Endocrine: Negative for cold intolerance, heat intolerance, polydipsia and polyuria.  Genitourinary: Negative for difficulty urinating, dysuria, frequency and urgency.  Musculoskeletal: Positive for arthralgias and gait problem. Negative for back pain, joint swelling, myalgias and neck pain.  Skin: Negative for pallor and rash.  Neurological: Negative for dizziness, tremors,  weakness, numbness and headaches.  Hematological: Negative for adenopathy. Does not bruise/bleed easily.  Psychiatric/Behavioral: Negative for decreased concentration and dysphoric mood. The patient is not nervous/anxious.        Objective:   Physical Exam Constitutional:      General: He is not in acute distress.    Appearance: Normal appearance. He is well-developed. He is obese. He is not ill-appearing or diaphoretic.  HENT:     Head: Normocephalic and atraumatic.     Mouth/Throat:     Mouth: Mucous membranes are moist.     Pharynx: Oropharynx is clear.  Eyes:     Conjunctiva/sclera: Conjunctivae normal.     Pupils: Pupils are equal, round, and reactive to light.  Neck:     Musculoskeletal: Normal range of motion and neck supple.     Thyroid: No thyromegaly.     Vascular: No carotid bruit or JVD.  Cardiovascular:     Rate and Rhythm: Normal rate and regular rhythm.     Pulses: Normal pulses.     Heart sounds: Normal heart sounds. No gallop.   Pulmonary:     Effort: Pulmonary effort is normal. No respiratory distress.     Breath sounds: Normal breath sounds. No stridor. No wheezing, rhonchi or rales.  Chest:     Chest wall: No tenderness.  Abdominal:     General: Bowel sounds are normal. There is no distension or abdominal bruit.     Palpations: Abdomen is soft. There is no mass.     Tenderness: There is no abdominal tenderness. There is no guarding or rebound.     Hernia: No hernia is present.  Musculoskeletal:        General: Tenderness present. No swelling, deformity or signs of injury.     Right lower leg: No edema.     Left lower leg: No edema.     Comments: RLE: No swelling or redness  No palpable cords Tender over posterior thigh Some mild greater troch tenderness  Tender over piriformis SLR on R causes buttock pain  No LS tenderness (some loss of lordosis)  Pain with internal rotation of hip (also into groin)  Neg Homan's sign  Lymphadenopathy:      Cervical: No cervical adenopathy.  Skin:    General: Skin is warm and dry.     Findings: No rash.     Comments: Solar lentigines diffusely Solar aging noted   Neurological:     Mental Status: He is alert. Mental status is at baseline.     Coordination: Coordination normal.     Deep Tendon Reflexes: Reflexes are normal and symmetric. Reflexes normal.     Comments: Using crutches due to R leg pain   Psychiatric:        Mood and Affect: Mood normal.           Assessment & Plan:   Problem List Items Addressed This Visit      Other   Right leg pain    Right leg and buttock pain-significant since Saturday -worse with movement  No swelling /redness Some tenderness of posterior thigh (less so over R buttock) Need to r/o DVT first (he is already on asa and plavix) If negative would consider films -hip/ LS  (other differential includes R troch bursitis, radiculopathy and hip OA)       Relevant Orders   US Venous Img Lower Unilateral Right

## 2018-12-17 NOTE — Assessment & Plan Note (Addendum)
Right leg and buttock pain-significant since Saturday -worse with movement  No swelling /redness Some tenderness of posterior thigh (less so over R buttock) Need to r/o DVT first (he is already on asa and plavix) If negative would consider films -hip/ LS  (other differential includes R troch bursitis, radiculopathy and hip OA)

## 2018-12-18 ENCOUNTER — Telehealth: Payer: Self-pay | Admitting: *Deleted

## 2018-12-18 MED ORDER — PREDNISONE 10 MG PO TABS: ORAL_TABLET | ORAL | 0 refills | Status: DC

## 2018-12-18 NOTE — Telephone Encounter (Signed)
-----   Message from Abner Greenspan, MD sent at 12/18/2018 12:47 PM EDT ----- That makes sense- it would help the inflammation pinching a nerve  How much prednisone does he have at home?  I would take 20 mg daily for 5-7 days then 10 mg daily for 5 days then stop  Keep me posted

## 2018-12-18 NOTE — Telephone Encounter (Signed)
Its still in her outbox for review

## 2018-12-18 NOTE — Telephone Encounter (Signed)
Pt notified of Dr. Marliss Coots instructions and verbalized understanding. Rx sent to pharmacy

## 2018-12-18 NOTE — Telephone Encounter (Signed)
Please check status of records.

## 2018-12-27 ENCOUNTER — Encounter: Payer: Self-pay | Admitting: Family Medicine

## 2019-01-18 DIAGNOSIS — E119 Type 2 diabetes mellitus without complications: Secondary | ICD-10-CM | POA: Diagnosis not present

## 2019-01-18 LAB — HM DIABETES EYE EXAM

## 2019-01-24 ENCOUNTER — Encounter: Payer: Self-pay | Admitting: Family Medicine

## 2019-01-28 ENCOUNTER — Other Ambulatory Visit: Payer: Self-pay | Admitting: Endocrinology

## 2019-01-28 DIAGNOSIS — R69 Illness, unspecified: Secondary | ICD-10-CM | POA: Diagnosis not present

## 2019-02-07 NOTE — Telephone Encounter (Signed)
Per Dr. Silverio Decamp, pt due for repeat colon in 08/2019. Pt aware. Records will be in black cabinet.

## 2019-02-12 ENCOUNTER — Other Ambulatory Visit: Payer: Self-pay | Admitting: Endocrinology

## 2019-02-12 NOTE — Telephone Encounter (Signed)
Please refill x 1 F/u is due  

## 2019-02-12 NOTE — Telephone Encounter (Signed)
LOV 10/29/18. Per your note, you requested pt f/u 1-2 months. No future appt noted. Please advise

## 2019-02-13 DIAGNOSIS — D2261 Melanocytic nevi of right upper limb, including shoulder: Secondary | ICD-10-CM | POA: Diagnosis not present

## 2019-02-13 DIAGNOSIS — L570 Actinic keratosis: Secondary | ICD-10-CM | POA: Diagnosis not present

## 2019-02-13 DIAGNOSIS — D2262 Melanocytic nevi of left upper limb, including shoulder: Secondary | ICD-10-CM | POA: Diagnosis not present

## 2019-02-13 DIAGNOSIS — D225 Melanocytic nevi of trunk: Secondary | ICD-10-CM | POA: Diagnosis not present

## 2019-02-13 DIAGNOSIS — L538 Other specified erythematous conditions: Secondary | ICD-10-CM | POA: Diagnosis not present

## 2019-02-13 DIAGNOSIS — L820 Inflamed seborrheic keratosis: Secondary | ICD-10-CM | POA: Diagnosis not present

## 2019-02-13 DIAGNOSIS — X32XXXA Exposure to sunlight, initial encounter: Secondary | ICD-10-CM | POA: Diagnosis not present

## 2019-02-13 DIAGNOSIS — Z85828 Personal history of other malignant neoplasm of skin: Secondary | ICD-10-CM | POA: Diagnosis not present

## 2019-02-20 ENCOUNTER — Telehealth: Payer: Self-pay | Admitting: Endocrinology

## 2019-02-20 DIAGNOSIS — E11311 Type 2 diabetes mellitus with unspecified diabetic retinopathy with macular edema: Secondary | ICD-10-CM

## 2019-02-20 DIAGNOSIS — R69 Illness, unspecified: Secondary | ICD-10-CM | POA: Diagnosis not present

## 2019-02-20 MED ORDER — ONETOUCH ULTRA VI STRP: 1.0000 | ORAL_STRIP | Freq: Every day | 0 refills | Status: DC

## 2019-02-20 NOTE — Telephone Encounter (Signed)
Per Dr. Loanne Drilling, can only refill test strips for 30 day supply. Pt will require an appt for any future refills. Routing this message to the front desk for scheduling purposes.

## 2019-02-20 NOTE — Telephone Encounter (Signed)
Patient is scheduled for 4 month Follow Up on 03/13/19 at 1:30 p.m.

## 2019-02-20 NOTE — Telephone Encounter (Signed)
Please refill x 1 F/u is due  

## 2019-02-20 NOTE — Telephone Encounter (Signed)
MEDICATION: glucose blood (ONETOUCH ULTRA) test strip  PHARMACY:   [facility], [place] [phone] (Phone) [phone] (Fax)    IS THIS A 90 DAY SUPPLY : Yes-wants 90 day supply  IS PATIENT OUT OF MEDICATION: Yes  IF NOT; HOW MUCH IS LEFT: 0  LAST APPOINTMENT DATE: @9 /15/2020  NEXT APPOINTMENT DATE:@Visit  date not found  DO WE HAVE YOUR PERMISSION TO LEAVE A DETAILED MESSAGE: Yes  OTHER COMMENTS:    **Let patient know to contact pharmacy at the end of the day to make sure medication is ready. **  ** Please notify patient to allow 48-72 hours to process**  **Encourage patient to contact the pharmacy for refills or they can request refills through [facility]**

## 2019-02-20 NOTE — Telephone Encounter (Signed)
LOV 10/29/18. Per your note: Please come back for a follow-up appointment in 1-2 months. Please advise

## 2019-02-21 ENCOUNTER — Other Ambulatory Visit: Payer: Self-pay | Admitting: Cardiovascular Disease

## 2019-02-21 ENCOUNTER — Other Ambulatory Visit: Payer: Self-pay | Admitting: Physician Assistant

## 2019-02-28 ENCOUNTER — Other Ambulatory Visit: Payer: Self-pay | Admitting: Cardiovascular Disease

## 2019-03-06 ENCOUNTER — Other Ambulatory Visit: Payer: Self-pay | Admitting: Endocrinology

## 2019-03-11 ENCOUNTER — Other Ambulatory Visit: Payer: Self-pay

## 2019-03-13 ENCOUNTER — Ambulatory Visit: Payer: Medicare HMO | Admitting: Endocrinology

## 2019-03-13 ENCOUNTER — Encounter: Payer: Self-pay | Admitting: Endocrinology

## 2019-03-13 ENCOUNTER — Other Ambulatory Visit: Payer: Self-pay

## 2019-03-13 VITALS — BP 130/80 | HR 79 | Ht 69.0 in | Wt 230.0 lb

## 2019-03-13 DIAGNOSIS — Z23 Encounter for immunization: Secondary | ICD-10-CM | POA: Diagnosis not present

## 2019-03-13 DIAGNOSIS — E11311 Type 2 diabetes mellitus with unspecified diabetic retinopathy with macular edema: Secondary | ICD-10-CM

## 2019-03-13 LAB — POCT GLYCOSYLATED HEMOGLOBIN (HGB A1C): Hemoglobin A1C: 7.4 % — AB (ref 4.0–5.6)

## 2019-03-13 MED ORDER — REPAGLINIDE 2 MG PO TABS: 2.0000 mg | ORAL_TABLET | Freq: Three times a day (TID) | ORAL | 3 refills | Status: DC

## 2019-03-13 NOTE — Patient Instructions (Addendum)
check your blood sugar once a day.  vary the time of day when you check, between before the 3 meals, and at bedtime.  also check if you have symptoms of your blood sugar being too high or too low.  please keep a record of the readings and bring it to your next appointment here (or you can bring the meter itself).  You can write it on any piece of paper.  please call us sooner if your blood sugar goes below 70, or if you have a lot of readings over 200. Please continue the same medication.  Please come back for a follow-up appointment in 4 months.

## 2019-03-13 NOTE — Progress Notes (Signed)
Subjective:    Patient ID: Ryan Morgan, male    DOB: July 01, 1940, 78 y.o.   MRN: [id number]  HPI Pt returns for f/u of diabetes mellitus: DM type: 2 Dx'ed: [id number] Complications: polyneuropathy, DR, and CAD.   Therapy: repaglinide.  DKA: never Severe hypoglycemia: never.   Pancreatitis: never Pancreatic imaging: never Other: he takes intermittent prednisone for Wegener's granulomatosis; he did not tolerate metformin (rash); he cannot afford brand name meds.   Interval history: he again took steroids 6 weeks ago.  Pt states this increased cbg's to 290.  Other than that, cbg varies from 70-350.  There is no trend throughout the day.  pt states he feels well in general.   Past Medical History:  Diagnosis Date  . Coronary artery disease    a. prior LAD stenting 2000; b. [place] 2003 LAD 40, D1 50; c. Lexiscan 02/2017: large defect of severe severity in the basal inferoseptal, basal inferior, mid inferoseptal, mid inferior and apical inferior location, high risk; d. LHC 03/29/17: LM nl, patent LAD stent w/ 40% ISR, D1 100,  OM1 90, RPDA 100 w/ L-R collats, post atrio 80% s/p PCI/DES    . Diabetes mellitus   . Hyperlipidemia   . Hypertension   . Ischemic cardiomyopathy    a. TTE 02/2017: EF 30-35%, diffuse HK, normal LV diastolic function parameters, mild AI/MR, RV systolic function normal, PASP normal  . Lung nodule    a. s/p prior thoracotomy  . Morbid obesity ([place])   . Wegener's granulomatosis ([place]) 2007    Past Surgical History:  Procedure Laterality Date  . BASAL CELL CARCINOMA EXCISION     removed from face x 3  . CARDIAC CATHETERIZATION    . CARDIAC SURGERY  2000   stent in heart  . CORONARY ANGIOPLASTY  09/22/1998   s/p stent placement; Tristar 3.0 x 18 mm Ref [id number]  . CORONARY STENT INTERVENTION N/A 03/29/2017   Procedure: CORONARY STENT INTERVENTION;  Surgeon: Wellington Hampshire, MD;  Location: [facility];  Service: Cardiovascular;  Laterality: N/A;  . LEFT HEART CATH  AND CORONARY ANGIOGRAPHY N/A 03/29/2017   Procedure: LEFT HEART CATH AND CORONARY ANGIOGRAPHY;  Surgeon: Wellington Hampshire, MD;  Location: [facility];  Service: Cardiovascular;  Laterality: N/A;  . 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 ULTRASOUND GUIDANCE FOR VASCULAR ACCESS  03/29/2017   Procedure: Ultrasound Guidance For Vascular Access;  Surgeon: Wellington Hampshire, MD;  Location: [facility];  Service: Cardiovascular;;    Social History   Socioeconomic History  . Marital status: Married    Spouse name: Not on file  . Number of children: Not on file  . Years of education: Not on file  . Highest education level: Not on file  Occupational History  . Not on file  Social Needs  . Financial resource strain: Not on file  . Food insecurity    Worry: Not on file    Inability: Not on file  . Transportation needs    Medical: Not on file    Non-medical: Not on file  Tobacco Use  . Smoking status: Former Smoker    Packs/day: 0.50    Years: 20.00    Pack years: 10.00    Types: Cigarettes    Quit date: 05/30/1978    Years since quitting: 40.8  . Smokeless tobacco: Never Used  Substance and Sexual Activity  . Alcohol use: No  Alcohol/week: 0.0 standard drinks  . Drug use: No  . Sexual activity: Not Currently  Lifestyle  . Physical activity    Days per week: Not on file    Minutes per session: Not on file  . Stress: Not on file  Relationships  . Social Herbalist on phone: Not on file    Gets together: Not on file    Attends religious service: Not on file    Active member of club or organization: Not on file    Attends meetings of clubs or organizations: Not on file    Relationship status: Not on file  . Intimate partner violence    Fear of current or ex partner: Not on file    Emotionally abused: Not on file    Physically abused: Not on file    Forced sexual activity: Not on file  Other Topics Concern  . Not on file   Social History Narrative   Regular exercise: yes   Caffeine use: very little    Current Outpatient Medications on File Prior to Visit  Medication Sig Dispense Refill  . aspirin EC 81 MG tablet Take 81 mg by mouth daily.    Marland Kitchen atorvastatin (LIPITOR) 20 MG tablet Take 1 tablet by mouth once daily 90 tablet 3  . carvedilol (COREG) 6.25 MG tablet Take 1 tablet by mouth twice daily 180 tablet 0  . cetirizine (ZYRTEC) 10 MG tablet Take 10 mg by mouth 2 (two) times daily.     . clobetasol cream (TEMOVATE) [id number] % Apply 1 application topically 2 (two) times daily. To affected areas (Patient taking differently: Apply 1 application topically 2 (two) times daily as needed (itching/rash). ) 30 g 1  . clopidogrel (PLAVIX) 75 MG tablet Take 1 tablet by mouth once daily 90 tablet 3  . ENTRESTO 49-51 MG Take 1 tablet by mouth twice daily 180 tablet 0  . ezetimibe (ZETIA) 10 MG tablet Take 1 tablet by mouth once daily 90 tablet 0  . glucose blood (ONETOUCH ULTRA) test strip 1 each by Other route daily. Will only provide 30 day supply. Call to schedule appt 25 each 0  . LANCETS ULTRA THIN 30G MISC by Does not apply route. One Touch    . Multiple Vitamin (MULTIVITAMIN WITH MINERALS) TABS tablet Take 1 tablet by mouth daily.    . Multiple Vitamins-Minerals (AIRBORNE) TBEF Take 1 tablet by mouth daily as needed (for immune system support). Immune Health/Support    . nystatin cream (MYCOSTATIN) Apply 1 application topically 2 (two) times daily as needed for dry skin.    Marland Kitchen spironolactone (ALDACTONE) 25 MG tablet Take 1 tablet (25 mg total) by mouth daily. *NEEDS APPOINTMENT FOR FURTHER REFILLS* 90 tablet 0  . tetrahydrozoline 0.05 % ophthalmic solution Place 1-2 drops into both eyes 3 (three) times daily as needed (for dry/irritated eyes).    . Triamcinolone Acetonide (NASACORT ALLERGY 24HR NA) Place 2 sprays into the nose at bedtime as needed (allergies).     . [DISCONTINUED] nebivolol (BYSTOLIC) 5 MG tablet Take 1  tablet (5 mg total) by mouth 2 (two) times daily. 180 tablet 3   No current facility-administered medications on file prior to visit.     Allergies  Allergen Reactions  . Metformin And Related Itching  . Benicar Hct [Olmesartan Medoxomil-Hctz] Itching    Painful and itchy knots on palms of hands  . Bystolic [Nebivolol Hcl] Other (See Comments)    headache  . Dapsone Hives  .  Lovaza [Omega-3-Acid Ethyl Esters] Other (See Comments)    Unknown reaction  . Niaspan [Niacin Er] Other (See Comments)    flushing  . Peanut-Containing Drug Products     Rash on hands from peanuts  . Septra [Bactrim] Hives    Family History  Problem Relation Age of Onset  . Cancer Mother        tumor on tonsil  . Stroke Maternal Grandfather   . Diabetes Neg Hx     BP 130/80 (BP Location: Right Arm, Patient Position: Sitting, Cuff Size: Large)   Pulse 79   Ht 5\' 9"  (1.753 m)   Wt 230 lb (104.3 kg)   SpO2 98%   BMI 33.97 kg/m    Review of Systems Denies LOC    Objective:   Physical Exam VITAL SIGNS:  See vs page GENERAL: no distress Pulses: dorsalis pedis intact bilat.   MSK: no deformity of the feet CV: no leg edema Skin:  no ulcer on the feet, but the skin is dry and scaly.  normal temp on the feet.  Spotty hyperpigmentation.  Neuro: sensation is intact to touch on the feet  Lab Results  Component Value Date   HGBA1C 7.4 (A) 03/13/2019   Lab Results  Component Value Date   CREATININE 0.84 11/26/2018   BUN 9 11/26/2018   NA 141 11/26/2018   K 4.1 11/26/2018   CL 104 11/26/2018   CO2 29 11/26/2018       Assessment & Plan:  Type 2 DM, with DR: well-controlled CAD: there is an interaction between repaglinide and Plavix.  We can't go higher on the repaglinide.   Wegener's granulomatosis: prednisone is affecting a1c.   Patient Instructions  check your blood sugar once a day.  vary the time of day when you check, between before the 3 meals, and at bedtime.  also check if you have  symptoms of your blood sugar being too high or too low.  please keep a record of the readings and bring it to your next appointment here (or you can bring the meter itself).  You can write it on any piece of paper.  please call us sooner if your blood sugar goes below 70, or if you have a lot of readings over 200. Please continue the same medication.  Please come back for a follow-up appointment in 4 months.

## 2019-03-23 ENCOUNTER — Other Ambulatory Visit: Payer: Self-pay | Admitting: Endocrinology

## 2019-03-23 DIAGNOSIS — R69 Illness, unspecified: Secondary | ICD-10-CM | POA: Diagnosis not present

## 2019-03-23 DIAGNOSIS — E11311 Type 2 diabetes mellitus with unspecified diabetic retinopathy with macular edema: Secondary | ICD-10-CM

## 2019-03-25 ENCOUNTER — Other Ambulatory Visit: Payer: Self-pay

## 2019-03-25 ENCOUNTER — Telehealth: Payer: Self-pay | Admitting: Endocrinology

## 2019-03-25 DIAGNOSIS — E11311 Type 2 diabetes mellitus with unspecified diabetic retinopathy with macular edema: Secondary | ICD-10-CM

## 2019-03-25 MED ORDER — ONETOUCH ULTRA VI STRP: 1.0000 | ORAL_STRIP | Freq: Every day | 2 refills | Status: DC

## 2019-03-25 NOTE — Telephone Encounter (Signed)
Patient's wife Fraser Din called to requests that the following RX be for a 90 day supply (instead of a RX for 25 test strips which do not last):  MEDICATION: ONETOUCH ULTRA test strip  PHARMACY:  [facility] [address], [place] [phone] (Phone) [phone] (Fax)    IS THIS A 90 DAY SUPPLY : Yes  IS PATIENT OUT OF MEDICATION: Yes  IF NOT; HOW MUCH IS LEFT: 0  LAST APPOINTMENT DATE: @10 /24/2020  NEXT APPOINTMENT DATE:@2 /17/2021  DO WE HAVE YOUR PERMISSION TO LEAVE A DETAILED MESSAGE: Yes on home phone answering machine  OTHER COMMENTS:    **Let patient know to contact pharmacy at the end of the day to make sure medication is ready. **  ** Please notify patient to allow 48-72 hours to process**  **Encourage patient to contact the pharmacy for refills or they can request refills through [facility]**

## 2019-03-25 NOTE — Telephone Encounter (Signed)
Patient Instructions  check your blood sugar once a day  glucose blood (ONETOUCH ULTRA) test strip 100 each 2 03/25/2019    Sig - Route: 1 each by Other route daily. Use to monitor glucose levels daily; E11.319 - Other   Sent to pharmacy as: glucose blood (ONETOUCH ULTRA) test strip   E-Prescribing Status: Receipt confirmed by pharmacy (03/25/2019 10:38 AM EDT)

## 2019-04-17 ENCOUNTER — Telehealth: Payer: Self-pay

## 2019-04-17 ENCOUNTER — Other Ambulatory Visit: Payer: Self-pay

## 2019-04-17 DIAGNOSIS — E11311 Type 2 diabetes mellitus with unspecified diabetic retinopathy with macular edema: Secondary | ICD-10-CM

## 2019-04-17 MED ORDER — ONETOUCH ULTRA VI STRP: 1.0000 | ORAL_STRIP | Freq: Every day | 2 refills | Status: DC

## 2019-04-17 NOTE — Telephone Encounter (Signed)
MEDICATION: glucose blood (ONETOUCH ULTRA) test strip  PHARMACY:  [place], [place] - [address]  IS THIS A 90 DAY SUPPLY :   IS PATIENT OUT OF MEDICATION:   IF NOT; HOW MUCH IS LEFT:   LAST APPOINTMENT DATE: @10 /26/2020  NEXT APPOINTMENT DATE:@2 /17/2021  DO WE HAVE YOUR PERMISSION TO LEAVE A DETAILED MESSAGE:  OTHER COMMENTS: patient says he has to have at least 3 month supply sent in    **Let patient know to contact pharmacy at the end of the day to make sure medication is ready. **  ** Please notify patient to allow 48-72 hours to process**  **Encourage patient to contact the pharmacy for refills or they can request refills through [facility]**

## 2019-04-17 NOTE — Telephone Encounter (Signed)
Rx was sent 03/25/19 with 2 refills. In essence pt should have called pharmacy for refill. Rx has been re-sent again today meaning he has 1 remaing refill on 03/25/19 Rx and now 3 additional refills.  glucose blood (ONETOUCH ULTRA) test strip 100 each 2 04/17/2019    Sig - Route: 1 each by Other route daily. Use to monitor glucose levels daily; E11.319 - Other   Sent to pharmacy as: glucose blood (ONETOUCH ULTRA) test strip   E-Prescribing Status: Receipt confirmed by pharmacy (04/17/2019 3:54 PM EST)

## 2019-04-22 ENCOUNTER — Other Ambulatory Visit: Payer: Self-pay

## 2019-04-22 DIAGNOSIS — R69 Illness, unspecified: Secondary | ICD-10-CM | POA: Diagnosis not present

## 2019-04-22 DIAGNOSIS — E11311 Type 2 diabetes mellitus with unspecified diabetic retinopathy with macular edema: Secondary | ICD-10-CM

## 2019-04-22 MED ORDER — ONETOUCH ULTRA VI STRP: 1.0000 | ORAL_STRIP | Freq: Every day | 2 refills | Status: DC

## 2019-06-02 ENCOUNTER — Other Ambulatory Visit: Payer: Self-pay | Admitting: Cardiovascular Disease

## 2019-06-02 DIAGNOSIS — R69 Illness, unspecified: Secondary | ICD-10-CM | POA: Diagnosis not present

## 2019-07-02 DIAGNOSIS — I776 Arteritis, unspecified: Secondary | ICD-10-CM | POA: Diagnosis not present

## 2019-07-04 ENCOUNTER — Other Ambulatory Visit: Payer: Self-pay

## 2019-07-04 ENCOUNTER — Other Ambulatory Visit: Payer: Self-pay | Admitting: Cardiovascular Disease

## 2019-07-04 MED ORDER — EZETIMIBE 10 MG PO TABS: 10.0000 mg | ORAL_TABLET | Freq: Every day | ORAL | 0 refills | Status: DC

## 2019-07-04 MED ORDER — ENTRESTO 49-51 MG PO TABS: 1.0000 | ORAL_TABLET | Freq: Two times a day (BID) | ORAL | 0 refills | Status: DC

## 2019-07-04 MED ORDER — SPIRONOLACTONE 25 MG PO TABS: 25.0000 mg | ORAL_TABLET | Freq: Every day | ORAL | 0 refills | Status: DC

## 2019-07-04 NOTE — Telephone Encounter (Signed)
*  STAT* If patient is at the pharmacy, call can be transferred to refill team.   1. Which medications need to be refilled? (please list name of each medication and dose if known) Entresto 49-51 mg bid, spirnolactone 25 daily and ezetimibe 10 mg daily  2. Which pharmacy/location (including street and city if local pharmacy) is medication to be sent to? [facility] on  [address]  3. Do they need a 30 day or 90 day supply? 90  Scheduled for 3/16 with [facility]

## 2019-07-04 NOTE — Telephone Encounter (Signed)
Requested Prescriptions   Signed Prescriptions Disp Refills  . sacubitril-valsartan (ENTRESTO) 49-51 MG 180 tablet 0    Sig: Take 1 tablet by mouth 2 (two) times daily.    Authorizing Provider: Minna Merritts    Ordering User: Othelia Pulling C  . ezetimibe (ZETIA) 10 MG tablet 90 tablet 0    Sig: Take 1 tablet (10 mg total) by mouth daily.    Authorizing Provider: Minna Merritts    Ordering User: Britt Bottom spironolactone (ALDACTONE) 25 MG tablet 90 tablet 0    Sig: Take 1 tablet (25 mg total) by mouth daily.    Authorizing Provider: Minna Merritts    Ordering User: Britt Bottom

## 2019-07-15 ENCOUNTER — Other Ambulatory Visit: Payer: Self-pay

## 2019-07-17 ENCOUNTER — Other Ambulatory Visit: Payer: Self-pay

## 2019-07-17 ENCOUNTER — Ambulatory Visit: Payer: Medicare HMO | Admitting: Endocrinology

## 2019-07-17 ENCOUNTER — Encounter: Payer: Self-pay | Admitting: Endocrinology

## 2019-07-17 VITALS — BP 130/70 | HR 85 | Ht 69.0 in | Wt 233.6 lb

## 2019-07-17 DIAGNOSIS — E11311 Type 2 diabetes mellitus with unspecified diabetic retinopathy with macular edema: Secondary | ICD-10-CM

## 2019-07-17 LAB — POCT GLYCOSYLATED HEMOGLOBIN (HGB A1C): Hemoglobin A1C: 8.4 % — AB (ref 4.0–5.6)

## 2019-07-17 MED ORDER — ACARBOSE 25 MG PO TABS: 12.5000 mg | ORAL_TABLET | Freq: Three times a day (TID) | ORAL | 3 refills | Status: DC

## 2019-07-17 NOTE — Patient Instructions (Signed)
I have sent a prescription to your pharmacy, to add acarbose.  Please continue the same repaglinide. check your blood sugar once a day.  vary the time of day when you check, between before the 3 meals, and at bedtime.  also check if you have symptoms of your blood sugar being too high or too low.  please keep a record of the readings and bring it to your next appointment here (or you can bring the meter itself).  You can write it on any piece of paper.  please call us sooner if your blood sugar goes below 70, or if you have a lot of readings over 200.  Please come back for a follow-up appointment in 2 months.

## 2019-07-17 NOTE — Progress Notes (Signed)
Subjective:    Patient ID: Ryan Morgan, male    DOB: 16-Sep-1940, 79 y.o.   MRN: [id number]  HPI Pt returns for f/u of diabetes mellitus:  DM type: 2 Dx'ed: [id number] Complications: polyneuropathy, DR, and CAD.   Therapy: repaglinide.  DKA: never Severe hypoglycemia: never.   Pancreatitis: never Pancreatic imaging: never SDOH: he cannot afford brand name meds.  Other: he takes intermittent prednisone for Wegener's granulomatosis; he did not tolerate metformin (rash).  Interval history: pt states he feels well in general.  Last steroid rx was 2 mos ago. Pt states this increased cbg's to 290.  Other than that, cbg varies from 100-264.  It is in general higher as the day goes on.  Past Medical History:  Diagnosis Date  . Coronary artery disease    a. prior LAD stenting 2000; b. [place] 2003 LAD 40, D1 50; c. Lexiscan 02/2017: large defect of severe severity in the basal inferoseptal, basal inferior, mid inferoseptal, mid inferior and apical inferior location, high risk; d. LHC 03/29/17: LM nl, patent LAD stent w/ 40% ISR, D1 100,  OM1 90, RPDA 100 w/ L-R collats, post atrio 80% s/p PCI/DES    . Diabetes mellitus   . Hyperlipidemia   . Hypertension   . Ischemic cardiomyopathy    a. TTE 02/2017: EF 30-35%, diffuse HK, normal LV diastolic function parameters, mild AI/MR, RV systolic function normal, PASP normal  . Lung nodule    a. s/p prior thoracotomy  . Morbid obesity ([place])   . Wegener's granulomatosis ([place]) 2007    Past Surgical History:  Procedure Laterality Date  . BASAL CELL CARCINOMA EXCISION     removed from face x 3  . CARDIAC CATHETERIZATION    . CARDIAC SURGERY  2000   stent in heart  . CORONARY ANGIOPLASTY  09/22/1998   s/p stent placement; [facility] 3.0 x 18 mm Ref [id number]  . CORONARY STENT INTERVENTION N/A 03/29/2017   Procedure: CORONARY STENT INTERVENTION;  Surgeon: Wellington Hampshire, MD;  Location: [facility];  Service: Cardiovascular;  Laterality: N/A;  . LEFT  HEART CATH AND CORONARY ANGIOGRAPHY N/A 03/29/2017   Procedure: LEFT HEART CATH AND CORONARY ANGIOGRAPHY;  Surgeon: Wellington Hampshire, MD;  Location: [facility];  Service: Cardiovascular;  Laterality: N/A;  . 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 ULTRASOUND GUIDANCE FOR VASCULAR ACCESS  03/29/2017   Procedure: Ultrasound Guidance For Vascular Access;  Surgeon: Wellington Hampshire, MD;  Location: [place];  Service: Cardiovascular;;    Social History   Socioeconomic History  . Marital status: Married    Spouse name: Not on file  . Number of children: Not on file  . Years of education: Not on file  . Highest education level: Not on file  Occupational History  . Not on file  Tobacco Use  . Smoking status: Former Smoker    Packs/day: 0.50    Years: 20.00    Pack years: 10.00    Types: Cigarettes    Quit date: 05/30/1978    Years since quitting: 41.1  . Smokeless tobacco: Never Used  Substance and Sexual Activity  . Alcohol use: No    Alcohol/week: 0.0 standard drinks  . Drug use: No  . Sexual activity: Not Currently  Other Topics Concern  . Not on file  Social History Narrative   Regular exercise: yes   Caffeine use: very little   Social Determinants  of Health   Financial Resource Strain:   . Difficulty of Paying Living Expenses: Not on file  Food Insecurity:   . Worried About Charity fundraiser in the Last Year: Not on file  . Ran Out of Food in the Last Year: Not on file  Transportation Needs:   . Lack of Transportation (Medical): Not on file  . Lack of Transportation (Non-Medical): Not on file  Physical Activity:   . Days of Exercise per Week: Not on file  . Minutes of Exercise per Session: Not on file  Stress:   . Feeling of Stress : Not on file  Social Connections:   . Frequency of Communication with Friends and Family: Not on file  . Frequency of Social Gatherings with Friends and Family: Not on file  . Attends  Religious Services: Not on file  . Active Member of Clubs or Organizations: Not on file  . Attends Archivist Meetings: Not on file  . Marital Status: Not on file  Intimate Partner Violence:   . Fear of Current or Ex-Partner: Not on file  . Emotionally Abused: Not on file  . Physically Abused: Not on file  . Sexually Abused: Not on file    Current Outpatient Medications on File Prior to Visit  Medication Sig Dispense Refill  . aspirin EC 81 MG tablet Take 81 mg by mouth daily.    Marland Kitchen atorvastatin (LIPITOR) 20 MG tablet Take 1 tablet by mouth once daily 90 tablet 3  . carvedilol (COREG) 6.25 MG tablet Take 1 tablet by mouth twice daily 60 tablet 0  . cetirizine (ZYRTEC) 10 MG tablet Take 10 mg by mouth 2 (two) times daily.     . clobetasol cream (TEMOVATE) [id number] % Apply 1 application topically 2 (two) times daily. To affected areas (Patient taking differently: Apply 1 application topically 2 (two) times daily as needed (itching/rash). ) 30 g 1  . clopidogrel (PLAVIX) 75 MG tablet Take 1 tablet by mouth once daily 90 tablet 3  . ezetimibe (ZETIA) 10 MG tablet Take 1 tablet (10 mg total) by mouth daily. 90 tablet 0  . glucose blood (ONETOUCH ULTRA) test strip 1 each by Other route daily. E11.319 100 each 2  . LANCETS ULTRA THIN 30G MISC by Does not apply route. One Touch    . Multiple Vitamin (MULTIVITAMIN WITH MINERALS) TABS tablet Take 1 tablet by mouth daily.    . Multiple Vitamins-Minerals (AIRBORNE) TBEF Take 1 tablet by mouth daily as needed (for immune system support). Immune Health/Support    . nystatin cream (MYCOSTATIN) Apply 1 application topically 2 (two) times daily as needed for dry skin.    . repaglinide (PRANDIN) 2 MG tablet Take 1 tablet (2 mg total) by mouth 3 (three) times daily before meals. 270 tablet 3  . sacubitril-valsartan (ENTRESTO) 49-51 MG Take 1 tablet by mouth 2 (two) times daily. 180 tablet 0  . spironolactone (ALDACTONE) 25 MG tablet Take 1 tablet (25 mg  total) by mouth daily. 90 tablet 0  . tetrahydrozoline 0.05 % ophthalmic solution Place 1-2 drops into both eyes 3 (three) times daily as needed (for dry/irritated eyes).    . Triamcinolone Acetonide (NASACORT ALLERGY 24HR NA) Place 2 sprays into the nose at bedtime as needed (allergies).     . [DISCONTINUED] nebivolol (BYSTOLIC) 5 MG tablet Take 1 tablet (5 mg total) by mouth 2 (two) times daily. 180 tablet 3   No current facility-administered medications on file prior to  visit.    Allergies  Allergen Reactions  . Metformin And Related Itching  . Benicar Hct [Olmesartan Medoxomil-Hctz] Itching    Painful and itchy knots on palms of hands  . Bystolic [Nebivolol Hcl] Other (See Comments)    headache  . Dapsone Hives  . Lovaza [Omega-3-Acid Ethyl Esters] Other (See Comments)    Unknown reaction  . Niaspan [Niacin Er] Other (See Comments)    flushing  . Peanut-Containing Drug Products     Rash on hands from peanuts  . Septra [Bactrim] Hives    Family History  Problem Relation Age of Onset  . Cancer Mother        tumor on tonsil  . Stroke Maternal Grandfather   . Diabetes Neg Hx     BP 130/70 (BP Location: Left Arm, Patient Position: Sitting, Cuff Size: Large)   Pulse 85   Ht 5\' 9"  (1.753 m)   Wt 233 lb 9.6 oz (106 kg)   SpO2 98%   BMI 34.50 kg/m    Review of Systems He denies hypoglycemia.     Objective:   Physical Exam VITAL SIGNS:  See vs page GENERAL: no distress Pulses: dorsalis pedis intact bilat.   MSK: no deformity of the feet.  CV: 1+ bilat leg edema.  Skin:  no ulcer on the feet, but the skin is dry and cracking.  normal color and temp on the feet.   Neuro: sensation is intact to touch on the feet.   Ext: there is bilateral onychomycosis of the toenails.  Slight ecchymosis of the left great toenail (recent injury).   Lab Results  Component Value Date   CREATININE 0.84 11/26/2018   BUN 9 11/26/2018   NA 141 11/26/2018   K 4.1 11/26/2018   CL 104  11/26/2018   CO2 29 11/26/2018     Lab Results  Component Value Date   HGBA1C 8.4 (A) 07/17/2019       Assessment & Plan:  Type 2 DM, with CAD: worse Edema: This limits rx options  Patient Instructions  I have sent a prescription to your pharmacy, to add acarbose.  Please continue the same repaglinide. check your blood sugar once a day.  vary the time of day when you check, between before the 3 meals, and at bedtime.  also check if you have symptoms of your blood sugar being too high or too low.  please keep a record of the readings and bring it to your next appointment here (or you can bring the meter itself).  You can write it on any piece of paper.  please call us sooner if your blood sugar goes below 70, or if you have a lot of readings over 200.  Please come back for a follow-up appointment in 2 months.

## 2019-07-24 ENCOUNTER — Telehealth: Payer: Self-pay | Admitting: Family Medicine

## 2019-07-24 NOTE — Chronic Care Management (AMB) (Signed)
Chronic Care Management   Note  07/24/2019 Name: NEFTALI ABAIR MRN: [id number] DOB: [birth year]  [name] is a 79 y.o. year old male who is a primary care patient of Tower, Wynelle Fanny, MD. I reached out to [name] by phone today in response to a referral sent by Mr. Felton Buczynski Hise's PCP, Tower, Wynelle Fanny, MD. Wife, Pat gave verbal consent.  Mr. Bann was given information about Chronic Care Management services today including:  1. CCM service includes personalized support from designated clinical staff supervised by his physician, including individualized plan of care and coordination with other care providers 2. 24/7 contact phone numbers for assistance for urgent and routine care needs. 3. Service will only be billed when office clinical staff spend 20 minutes or more in a month to coordinate care. 4. Only one practitioner may furnish and bill the service in a calendar month. 5. The patient may stop CCM services at any time (effective at the end of the month) by phone call to the office staff. 6. The patient will be responsible for cost sharing (co-pay) of up to 20% of the service fee (after annual deductible is met).  Patient agreed to services and verbal consent obtained.   Follow up plan:   Raynicia Dukes [employer] Scheduler

## 2019-07-25 DIAGNOSIS — R69 Illness, unspecified: Secondary | ICD-10-CM | POA: Diagnosis not present

## 2019-07-26 ENCOUNTER — Encounter: Payer: Self-pay | Admitting: Emergency Medicine

## 2019-07-26 ENCOUNTER — Other Ambulatory Visit: Payer: Self-pay

## 2019-07-26 ENCOUNTER — Emergency Department
Admission: EM | Admit: 2019-07-26 | Discharge: 2019-07-26 | Disposition: A | Discharge status: Home / Self Care | Payer: Medicare HMO | Attending: Student in an Organized Health Care Education/Training Program | Admitting: Student in an Organized Health Care Education/Training Program

## 2019-07-26 ENCOUNTER — Emergency Department: Payer: Medicare HMO

## 2019-07-26 DIAGNOSIS — Z7982 Long term (current) use of aspirin: Secondary | ICD-10-CM | POA: Insufficient documentation

## 2019-07-26 DIAGNOSIS — W293XXA Contact with powered garden and outdoor hand tools and machinery, initial encounter: Secondary | ICD-10-CM | POA: Diagnosis not present

## 2019-07-26 DIAGNOSIS — Z87891 Personal history of nicotine dependence: Secondary | ICD-10-CM | POA: Diagnosis not present

## 2019-07-26 DIAGNOSIS — Y939 Activity, unspecified: Secondary | ICD-10-CM | POA: Insufficient documentation

## 2019-07-26 DIAGNOSIS — I2510 Atherosclerotic heart disease of native coronary artery without angina pectoris: Secondary | ICD-10-CM | POA: Insufficient documentation

## 2019-07-26 DIAGNOSIS — Z91010 Allergy to peanuts: Secondary | ICD-10-CM | POA: Diagnosis not present

## 2019-07-26 DIAGNOSIS — I10 Essential (primary) hypertension: Secondary | ICD-10-CM | POA: Diagnosis not present

## 2019-07-26 DIAGNOSIS — Y999 Unspecified external cause status: Secondary | ICD-10-CM | POA: Diagnosis not present

## 2019-07-26 DIAGNOSIS — S61012A Laceration without foreign body of left thumb without damage to nail, initial encounter: Secondary | ICD-10-CM | POA: Diagnosis not present

## 2019-07-26 DIAGNOSIS — S62522A Displaced fracture of distal phalanx of left thumb, initial encounter for closed fracture: Secondary | ICD-10-CM | POA: Diagnosis not present

## 2019-07-26 DIAGNOSIS — Z79899 Other long term (current) drug therapy: Secondary | ICD-10-CM | POA: Diagnosis not present

## 2019-07-26 DIAGNOSIS — Z7984 Long term (current) use of oral hypoglycemic drugs: Secondary | ICD-10-CM | POA: Insufficient documentation

## 2019-07-26 DIAGNOSIS — Y929 Unspecified place or not applicable: Secondary | ICD-10-CM | POA: Insufficient documentation

## 2019-07-26 DIAGNOSIS — S61022A Laceration with foreign body of left thumb without damage to nail, initial encounter: Secondary | ICD-10-CM | POA: Insufficient documentation

## 2019-07-26 DIAGNOSIS — S62522B Displaced fracture of distal phalanx of left thumb, initial encounter for open fracture: Secondary | ICD-10-CM

## 2019-07-26 DIAGNOSIS — E119 Type 2 diabetes mellitus without complications: Secondary | ICD-10-CM | POA: Insufficient documentation

## 2019-07-26 MED ORDER — CEPHALEXIN 500 MG PO CAPS
500.0000 mg | ORAL_CAPSULE | Freq: Once | ORAL | Status: AC
  Administered 2019-07-26: 500 mg via ORAL
  Filled 2019-07-26: qty 1

## 2019-07-26 MED ORDER — BUPIVACAINE HCL (PF) 0.5 % IJ SOLN
10.0000 mL | Freq: Once | INTRAMUSCULAR | Status: AC
  Administered 2019-07-26: 10 mL
  Filled 2019-07-26: qty 10

## 2019-07-26 MED ORDER — LIDOCAINE HCL (PF) 1 % IJ SOLN
5.0000 mL | Freq: Once | INTRAMUSCULAR | Status: AC
  Administered 2019-07-26: 5 mL
  Filled 2019-07-26: qty 5

## 2019-07-26 MED ORDER — DOXYCYCLINE HYCLATE 100 MG PO TABS: 100.0000 mg | ORAL_TABLET | Freq: Two times a day (BID) | ORAL | 0 refills | Status: DC

## 2019-07-26 MED ORDER — PENTAFLUOROPROP-TETRAFLUOROETH EX AERO
1.0000 "application " | INHALATION_SPRAY | CUTANEOUS | Status: DC | PRN
  Administered 2019-07-26: 1 via TOPICAL
  Filled 2019-07-26: qty 30

## 2019-07-26 MED ORDER — CEPHALEXIN 500 MG PO CAPS: 500.0000 mg | ORAL_CAPSULE | Freq: Three times a day (TID) | ORAL | 0 refills | Status: AC

## 2019-07-26 MED ORDER — DOXYCYCLINE HYCLATE 100 MG PO TABS
100.0000 mg | ORAL_TABLET | Freq: Once | ORAL | Status: AC
  Administered 2019-07-26: 100 mg via ORAL
  Filled 2019-07-26: qty 1

## 2019-07-26 NOTE — ED Triage Notes (Signed)
Presents to ED with laceration to left thimb by a table saw

## 2019-07-26 NOTE — Discharge Instructions (Addendum)
You have an open fracture to the tip of the thumb. The laceration has been closed with sutures and wound adhesive. Keep the wound clean, dry, and covered. Avoid any lotions, creams, or ointments over the wound glue. See your provider in 10-12 days for suture removal. See Dr. Rudene Christians (Orthopedics) for fracture care and wound check. Take the antibiotic as prescribed. Return to the ED as needed.

## 2019-07-26 NOTE — ED Provider Notes (Signed)
[facility] Emergency Department Provider Note ____________________________________________  Time seen: 1337  I have reviewed the triage vital signs and the nursing notes.  HISTORY  Chief Complaint  Laceration  HPI Ryan Morgan is a 79 y.o. male presents himself to the ED for evaluation of accidental laceration to the left thumb.  Patient admits to left thumb laceration while using his table saw just prior to arrival.  He denies any other injury at this time.  He reports a current tetanus status.   Past Medical History:  Diagnosis Date  . Coronary artery disease    a. prior LAD stenting 2000; b. [place] 2003 LAD 40, D1 50; c. Lexiscan 02/2017: large defect of severe severity in the basal inferoseptal, basal inferior, mid inferoseptal, mid inferior and apical inferior location, high risk; d. LHC 03/29/17: LM nl, patent LAD stent w/ 40% ISR, D1 100,  OM1 90, RPDA 100 w/ L-R collats, post atrio 80% s/p PCI/DES    . Diabetes mellitus   . Hyperlipidemia   . Hypertension   . Ischemic cardiomyopathy    a. TTE 02/2017: EF 30-35%, diffuse HK, normal LV diastolic function parameters, mild AI/MR, RV systolic function normal, PASP normal  . Lung nodule    a. s/p prior thoracotomy  . Morbid obesity ([place])   . Wegener's granulomatosis ([place]) 2007    Patient Active Problem List   Diagnosis Date Noted  . Right leg pain 12/17/2018  . Ischemic cardiomyopathy 09/10/2018  . Near syncope 08/25/2018  . Hip joint effusion, left 05/03/2018  . Laceration of arm 12/04/2017  . Current chronic use of systemic steroids 11/15/2016  . Routine general medical examination at a health care facility 11/09/2015  . Hearing loss 11/09/2015  . History of colonic polyps 11/09/2015  . Rash of hands 03/10/2015  . Colon cancer screening 06/09/2014  . Blepharitis, bilateral 06/02/2014  . Shortness of breath 03/10/2014  . Benign paroxysmal positional vertigo 12/14/2013  . Bursitis of right hip  10/17/2013  . Coronary artery disease of native artery of native heart with stable angina pectoris ([place]) 03/19/2013  . Encounter for Medicare annual wellness exam 03/05/2013  . 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 Osteopenia 03/05/2013  . Morbid obesity ([place]) 07/15/2011  . Prostate cancer screening 07/10/2011  . Cough 12/23/2010  . Hypertension 08/30/2010  . Hyperlipemia 08/30/2010  . 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 Wegener's granulomatosis ([place]) 08/30/2010    Past Surgical History:  Procedure Laterality Date  . BASAL CELL CARCINOMA EXCISION     removed from face x 3  . CARDIAC CATHETERIZATION    . CARDIAC SURGERY  2000   stent in heart  . CORONARY ANGIOPLASTY  09/22/1998   s/p stent placement; [employer] 3.0 x 18 mm Ref [id number]  . CORONARY STENT INTERVENTION N/A 03/29/2017   Procedure: CORONARY STENT INTERVENTION;  Surgeon: Wellington Hampshire, MD;  Location: [facility];  Service: Cardiovascular;  Laterality: N/A;  . LEFT HEART CATH AND CORONARY ANGIOGRAPHY N/A 03/29/2017   Procedure: LEFT HEART CATH AND CORONARY ANGIOGRAPHY;  Surgeon: Wellington Hampshire, MD;  Location: [facility];  Service: Cardiovascular;  Laterality: N/A;  . 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 ULTRASOUND GUIDANCE FOR VASCULAR ACCESS  03/29/2017   Procedure: Ultrasound Guidance For Vascular Access;  Surgeon: Wellington Hampshire, MD;  Location: [facility];  Service: Cardiovascular;;    Prior  to Admission medications   Medication Sig Start Date End Date Taking? Authorizing Provider  acarbose (PRECOSE) 25 MG tablet Take 0.5 tablets (12.5 mg total) by mouth 3 (three) times daily with meals. 07/17/19   Renato Shin, MD  aspirin EC 81 MG tablet Take 81 mg by mouth daily.    [provider]  atorvastatin (LIPITOR) 20 MG tablet Take 1 tablet by mouth once daily 12/04/18   Tower, Afton A, MD  carvedilol  (COREG) 6.25 MG tablet Take 1 tablet by mouth twice daily 06/03/19   Minna Merritts, MD  cetirizine (ZYRTEC) 10 MG tablet Take 10 mg by mouth 2 (two) times daily.     [provider]  clobetasol cream (TEMOVATE) [id number] % Apply 1 application topically 2 (two) times daily. To affected areas Patient taking differently: Apply 1 application topically 2 (two) times daily as needed (itching/rash).  04/19/17   Ria Bush, MD  clopidogrel (PLAVIX) 75 MG tablet Take 1 tablet by mouth once daily 02/21/19   Minna Merritts, MD  ezetimibe (ZETIA) 10 MG tablet Take 1 tablet (10 mg total) by mouth daily. 07/04/19   Minna Merritts, MD  glucose blood (ONETOUCH ULTRA) test strip 1 each by Other route daily. E11.319 04/22/19   Renato Shin, MD  LANCETS ULTRA THIN 30G MISC by Does not apply route. One Touch    [provider]  Multiple Vitamin (MULTIVITAMIN WITH MINERALS) TABS tablet Take 1 tablet by mouth daily.    [provider]  Multiple Vitamins-Minerals (AIRBORNE) TBEF Take 1 tablet by mouth daily as needed (for immune system support). Immune Health/Support    [provider]  nystatin cream (MYCOSTATIN) Apply 1 application topically 2 (two) times daily as needed for dry skin.    [provider]  repaglinide (PRANDIN) 2 MG tablet Take 1 tablet (2 mg total) by mouth 3 (three) times daily before meals. 03/13/19   Renato Shin, MD  sacubitril-valsartan (ENTRESTO) 49-51 MG Take 1 tablet by mouth 2 (two) times daily. 07/04/19   Minna Merritts, MD  spironolactone (ALDACTONE) 25 MG tablet Take 1 tablet (25 mg total) by mouth daily. 07/04/19   Minna Merritts, MD  tetrahydrozoline 0.05 % ophthalmic solution Place 1-2 drops into both eyes 3 (three) times daily as needed (for dry/irritated eyes).    [provider]  Triamcinolone Acetonide (NASACORT ALLERGY 24HR NA) Place 2 sprays into the nose at bedtime as needed (allergies).     [provider]   nebivolol (BYSTOLIC) 5 MG tablet Take 1 tablet (5 mg total) by mouth 2 (two) times daily. 07/15/11 05/03/18  Tower, Wynelle Fanny, MD    Allergies Metformin and related, Benicar hct [olmesartan medoxomil-hctz], Bystolic [nebivolol hcl], Dapsone, Lovaza [omega-3-acid ethyl esters], Niaspan [niacin er], Peanut-containing drug products, and Septra [bactrim]  Family History  Problem Relation Age of Onset  . Cancer Mother        tumor on tonsil  . Stroke Maternal Grandfather   . Diabetes Neg Hx     Social History Social History   Tobacco Use  . Smoking status: Former Smoker    Packs/day: 0.50    Years: 20.00    Pack years: 10.00    Types: Cigarettes    Quit date: 05/30/1978    Years since quitting: 41.1  . Smokeless tobacco: Never Used  Substance Use Topics  . Alcohol use: No    Alcohol/week: 0.0 standard drinks  . Drug use: No    Review  of Systems  Constitutional: Negative for fever. Cardiovascular: Negative for chest pain. Respiratory: Negative for shortness of breath. Musculoskeletal: Negative for back pain. Left thumb laceration.  Skin: Negative for rash. Neurological: Negative for headaches, focal weakness or numbness. ____________________________________________  PHYSICAL EXAM:  VITAL SIGNS: ED Triage Vitals  Enc Vitals Group     BP 07/26/19 1346 (!) 150/80     Pulse Rate 07/26/19 1346 (!) 111     Resp 07/26/19 1346 17     Temp 07/26/19 1346 97.7 F (36.5 C)     Temp Source 07/26/19 1346 Oral     SpO2 07/26/19 1346 97 %     Weight 07/26/19 1333 230 lb (104.3 kg)     Height 07/26/19 1333 5\' 10"  (1.778 m)     Head Circumference --      Peak Flow --      Pain Score 07/26/19 1332 3     Pain Loc --      Pain Edu? --      Excl. in [place]? --     Constitutional: Alert and oriented. Well appearing and in no distress. Head: Normocephalic and atraumatic. Eyes: Conjunctivae are normal. Normal extraocular movements Cardiovascular: Normal rate, regular rhythm. Normal distal  pulses. Respiratory: Normal respiratory effort.  Musculoskeletal: Normal composite fist on the left.  Left thumb with laceration noted across the distal phalanx.  The laceration extends from medial to the lateral aspect of the distal fat pad just under the nail bed.  It extends to the top of the bone, however bone is not visualized. Nontender with normal range of motion in all extremities.  Neurologic:  Normal gross sensation.  Normal speech and language. No gross focal neurologic deficits are appreciated. Skin:  Skin is warm, dry and intact. No rash noted. ____________________________________________   RADIOLOGY  DG Left Thumb IMPRESSION: Comminuted minimally displaced fracture involving the tuft of the left thumb distal phalanx. Irregularity of the overlying nail bed compatible with an open fracture. No radiopaque foreign body.  I, Melvenia Needles, personally viewed and evaluated these images (plain radiographs) as part of my medical decision making, as well as reviewing the written report by the radiologist. ____________________________________________  PROCEDURES  Keflex 500 mg PO Doxycycline 100 mg PO  .Marland KitchenLaceration Repair  Date/Time: 07/26/2019 2:08 PM Performed by: Melvenia Needles, PA-C Authorized by: Melvenia Needles, PA-C   Consent:    Consent obtained:  Verbal   Consent given by:  Patient   Risks discussed:  Pain, poor wound healing and infection   Alternatives discussed:  Delayed treatment Anesthesia (see MAR for exact dosages):    Anesthesia method:  Nerve block   Block location:  Left thumb   Block needle gauge:  27 G   Block anesthetic:  Lidocaine 1% w/o epi and bupivacaine 0.5% w/o epi   Block injection procedure:  Anatomic landmarks palpated, negative aspiration for blood and incremental injection   Block outcome:  Anesthesia achieved Laceration details:    Location:  Finger   Finger location:  L thumb Repair type:    Repair type:   Intermediate Pre-procedure details:    Preparation:  Patient was prepped and draped in usual sterile fashion Exploration:    Contaminated: no   Treatment:    Area cleansed with:  Betadine and saline   Amount of cleaning:  Standard   Irrigation solution:  Sterile saline   Irrigation method:  Syringe Skin repair:    Repair method:  Sutures and  tissue adhesive   Suture size:  4-0   Suture material:  Nylon   Suture technique:  Simple interrupted   Number of sutures:  5 Approximation:    Approximation:  Close Post-procedure details:    Dressing:  Non-adherent dressing   Patient tolerance of procedure:  Tolerated well, no immediate complications  ____________________________________________  INITIAL IMPRESSION / ASSESSMENT AND PLAN / ED COURSE  Patient with ED evaluation of an accidental laceration to the left thumb.  Patient sustained an open tuft fracture to the left thumb after his thumb made contact with a table saw.  He was treated with a wound repair and started empirically on Keflex & Doxycycline for his open wound prophylaxis.  Patient is discharged to follow-up with a primary provider as well as orthopedics for further fracture management and wound care.  Return instructions have been reviewed patient verbalized understanding.  Ryan Morgan was evaluated in Emergency Department on 07/26/2019 for the symptoms described in the history of present illness. He was evaluated in the context of the global COVID-19 pandemic, which necessitated consideration that the patient might be at risk for infection with the SARS-CoV-2 virus that causes COVID-19. Institutional protocols and algorithms that pertain to the evaluation of patients at risk for COVID-19 are in a state of rapid change based on information released by regulatory bodies including the CDC and federal and state organizations. These policies and algorithms were followed during the patient's care in the  ED. ____________________________________________  FINAL CLINICAL IMPRESSION(S) / ED DIAGNOSES  Final diagnoses:  Laceration of left thumb without foreign body without damage to nail, initial encounter      Melvenia Needles, PA-C 07/26/19 1602    Merlyn Lot, MD 07/29/19 1204

## 2019-08-05 DIAGNOSIS — S61112A Laceration without foreign body of left thumb with damage to nail, initial encounter: Secondary | ICD-10-CM | POA: Diagnosis not present

## 2019-08-11 NOTE — Progress Notes (Signed)
Date:  08/13/2019   ID:  Ryan Morgan, DOB Jan 06, 1941, MRN [id number]  Patient Location:  [place] [address]   Provider location:   [facility], [place] office  PCP:  Abner Greenspan, MD  Cardiologist:  Arvid Right [facility]  Chief Complaint  Patient presents with  . office visit    pt wants to know if they need another injection fraction test. Meds verbally reviewed w/ pt.      History of Present Illness:    Ryan Morgan is a 79 y.o. male who presents via audio/video conferencing for a telehealth visit today.   The patient does not symptoms concerning for COVID-19 infection (fever, chills, cough, or new SHORTNESS OF BREATH).   Patient has a past medical history of DM,  smoking hx,  coronary artery disease,  stent placed to his LAD in 2000,  (Lad stent 3.0 x 18 in 2000) repeat catheterization in January 2003 with 40% LAD, 50% diagonal disease, history of Wegener's granulomatosis, on chronic steroids diabetes. Prior thoracotomy surgery on the left for lung nodule, chronic pain on the left that is episodic, stable over several years Chronic shortness of breath, sedentary lifestyle, obesity/deconditioning Stent October 2018 for shortness of breath, chest tightness, fatigue Echocardiogram September 12, 2017 Ejection fraction 30 to 35% who presents for follow up of his CAD and shortness of breath   Feels well, can walk "all day long" Better than before Wonders what his ejection fraction is On last clinic visit declined echocardiogram  No near syncope or syncope No orthostasis symptoms  Lab work reviewed from primary care HBA1C 8.4  No anginal symptoms on exertion  Medication list reviewed  on entresto 49/51mg  BID carvedilol, spironolactone 25 lipitor 20 daily  EKG personally reviewed by myself on todays visit Shows NSR rate 76 bpm  Other past medical hx March 2020 Did not feel well, may have had some nausea, diaphoresis Got  inside, nearsyncope  Felt by the hospital team that he could have had a vagal event given the  prodrome of nausea and sweating Tropon negative x3. Lab work looks normal, no sense of dehydration He does not remember much of a prodrome of nausea or sweating before feeling dizzy Heart rate 55 and higher in the hospital, into the 60s  Prior CV studies:   The following studies were reviewed today:  Other past medical history reviewed Echocardiogram October 2018 ejection fraction 30 to 35%  Stress test October 2018 prior MI Prior MI, no significant ischemia Ejection fraction 36%  Cardiac catheterization October 2018 Patent LAD stent mild to moderate in-stent restenosis Occluded diagonal #1 Occluded right PDA with left-to-right collaterals Severe disease right posterior AV groove artery with collaterals Ejection fraction 40% Successful angioplasty stent to ostial right posterior AV groove artery Ejection fraction at that time 40%  Echocardiogram September 12, 2017 Ejection fraction 30 to 35%  Stress test from January 2009 was a treadmill study, ejection fraction estimated at 45%, with medium-sized area of moderate to severe hypoperfusion involving the septal region.  Past Medical History:  Diagnosis Date  . Coronary artery disease    a. prior LAD stenting 2000; b. [place] 2003 LAD 40, D1 50; c. Lexiscan 02/2017: large defect of severe severity in the basal inferoseptal, basal inferior, mid inferoseptal, mid inferior and apical inferior location, high risk; d. LHC 03/29/17: LM nl, patent LAD stent w/ 40% ISR, D1 100,  OM1 90, RPDA 100 w/ L-R collats, post atrio  80% s/p PCI/DES    . Diabetes mellitus   . Hyperlipidemia   . Hypertension   . Ischemic cardiomyopathy    a. TTE 02/2017: EF 30-35%, diffuse HK, normal LV diastolic function parameters, mild AI/MR, RV systolic function normal, PASP normal  . Lung nodule    a. s/p prior thoracotomy  . Morbid obesity ([place])   . Wegener's  granulomatosis ([place]) 2007   Past Surgical History:  Procedure Laterality Date  . BASAL CELL CARCINOMA EXCISION     removed from face x 3  . CARDIAC CATHETERIZATION    . CARDIAC SURGERY  2000   stent in heart  . CORONARY ANGIOPLASTY  09/22/1998   s/p stent placement; [employer] 3.0 x 18 mm Ref [id number]  . CORONARY STENT INTERVENTION N/A 03/29/2017   Procedure: CORONARY STENT INTERVENTION;  Surgeon: Wellington Hampshire, MD;  Location: [place] [facility];  Service: Cardiovascular;  Laterality: N/A;  . LEFT HEART CATH AND CORONARY ANGIOGRAPHY N/A 03/29/2017   Procedure: LEFT HEART CATH AND CORONARY ANGIOGRAPHY;  Surgeon: Wellington Hampshire, MD;  Location: [place] [facility];  Service: Cardiovascular;  Laterality: N/A;  . 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 ULTRASOUND GUIDANCE FOR VASCULAR ACCESS  03/29/2017   Procedure: Ultrasound Guidance For Vascular Access;  Surgeon: Wellington Hampshire, MD;  Location: [place] [facility];  Service: Cardiovascular;;     Current Meds  Medication Sig  . acarbose (PRECOSE) 25 MG tablet Take 0.5 tablets (12.5 mg total) by mouth 3 (three) times daily with meals.  Marland Kitchen aspirin EC 81 MG tablet Take 81 mg by mouth daily.  Marland Kitchen atorvastatin (LIPITOR) 20 MG tablet Take 1 tablet by mouth once daily  . carvedilol (COREG) 6.25 MG tablet Take 1 tablet by mouth twice daily  . cetirizine (ZYRTEC) 10 MG tablet Take 10 mg by mouth 2 (two) times daily.   . clobetasol cream (TEMOVATE) AB-123456789 % Apply 1 application topically 2 (two) times daily. To affected areas (Patient taking differently: Apply 1 application topically 2 (two) times daily as needed (itching/rash). )  . clopidogrel (PLAVIX) 75 MG tablet Take 1 tablet by mouth once daily  . ezetimibe (ZETIA) 10 MG tablet Take 1 tablet (10 mg total) by mouth daily.  Marland Kitchen glucose blood (ONETOUCH ULTRA) test strip 1 each by Other route daily. E11.319  . LANCETS ULTRA THIN 30G MISC by Does not apply route. One Touch  .  Multiple Vitamin (MULTIVITAMIN WITH MINERALS) TABS tablet Take 1 tablet by mouth daily.  . Multiple Vitamins-Minerals (AIRBORNE) TBEF Take 1 tablet by mouth daily as needed (for immune system support). Immune Health/Support  . nystatin cream (MYCOSTATIN) Apply 1 application topically 2 (two) times daily as needed for dry skin.  . repaglinide (PRANDIN) 2 MG tablet Take 1 tablet (2 mg total) by mouth 3 (three) times daily before meals.  . sacubitril-valsartan (ENTRESTO) 49-51 MG Take 1 tablet by mouth 2 (two) times daily.  Marland Kitchen spironolactone (ALDACTONE) 25 MG tablet Take 1 tablet (25 mg total) by mouth daily.  Marland Kitchen tetrahydrozoline 0.05 % ophthalmic solution Place 1-2 drops into both eyes 3 (three) times daily as needed (for dry/irritated eyes).  . Triamcinolone Acetonide (NASACORT ALLERGY 24HR NA) Place 2 sprays into the nose at bedtime as needed (allergies).      Allergies:   Metformin and related, Benicar hct [olmesartan medoxomil-hctz], Bystolic [nebivolol hcl], Dapsone, Lovaza [omega-3-acid ethyl esters], Niaspan [niacin er], Peanut-containing drug products, and Septra [bactrim]  Social History   Tobacco Use  . Smoking status: Former Smoker    Packs/day: 0.50    Years: 20.00    Pack years: 10.00    Types: Cigarettes    Quit date: 05/30/1978    Years since quitting: 41.2  . Smokeless tobacco: Never Used  Substance Use Topics  . Alcohol use: No    Alcohol/week: 0.0 standard drinks  . Drug use: No     Current Outpatient Medications on File Prior to Visit  Medication Sig Dispense Refill  . acarbose (PRECOSE) 25 MG tablet Take 0.5 tablets (12.5 mg total) by mouth 3 (three) times daily with meals. 135 tablet 3  . aspirin EC 81 MG tablet Take 81 mg by mouth daily.    Marland Kitchen atorvastatin (LIPITOR) 20 MG tablet Take 1 tablet by mouth once daily 90 tablet 3  . carvedilol (COREG) 6.25 MG tablet Take 1 tablet by mouth twice daily 60 tablet 0  . cetirizine (ZYRTEC) 10 MG tablet Take 10 mg by mouth 2  (two) times daily.     . clobetasol cream (TEMOVATE) AB-123456789 % Apply 1 application topically 2 (two) times daily. To affected areas (Patient taking differently: Apply 1 application topically 2 (two) times daily as needed (itching/rash). ) 30 g 1  . clopidogrel (PLAVIX) 75 MG tablet Take 1 tablet by mouth once daily 90 tablet 3  . ezetimibe (ZETIA) 10 MG tablet Take 1 tablet (10 mg total) by mouth daily. 90 tablet 0  . glucose blood (ONETOUCH ULTRA) test strip 1 each by Other route daily. E11.319 100 each 2  . LANCETS ULTRA THIN 30G MISC by Does not apply route. One Touch    . Multiple Vitamin (MULTIVITAMIN WITH MINERALS) TABS tablet Take 1 tablet by mouth daily.    . Multiple Vitamins-Minerals (AIRBORNE) TBEF Take 1 tablet by mouth daily as needed (for immune system support). Immune Health/Support    . nystatin cream (MYCOSTATIN) Apply 1 application topically 2 (two) times daily as needed for dry skin.    . repaglinide (PRANDIN) 2 MG tablet Take 1 tablet (2 mg total) by mouth 3 (three) times daily before meals. 270 tablet 3  . sacubitril-valsartan (ENTRESTO) 49-51 MG Take 1 tablet by mouth 2 (two) times daily. 180 tablet 0  . spironolactone (ALDACTONE) 25 MG tablet Take 1 tablet (25 mg total) by mouth daily. 90 tablet 0  . tetrahydrozoline 0.05 % ophthalmic solution Place 1-2 drops into both eyes 3 (three) times daily as needed (for dry/irritated eyes).    . Triamcinolone Acetonide (NASACORT ALLERGY 24HR NA) Place 2 sprays into the nose at bedtime as needed (allergies).     . [DISCONTINUED] nebivolol (BYSTOLIC) 5 MG tablet Take 1 tablet (5 mg total) by mouth 2 (two) times daily. 180 tablet 3   No current facility-administered medications on file prior to visit.     Family Hx: The patient's family history includes Cancer in his mother; Stroke in his maternal grandfather. There is no history of Diabetes.  ROS:   Please see the history of present illness.    Review of Systems  Constitutional:  Negative.   Respiratory: Negative.   Cardiovascular: Negative.   Gastrointestinal: Negative.   Musculoskeletal: Negative.   Neurological: Positive for dizziness and loss of consciousness.  Psychiatric/Behavioral: Negative.   All other systems reviewed and are negative.    Labs/Other Tests and Data Reviewed:    Recent Labs: 08/24/2018: TSH 1.115 11/26/2018: ALT 13; BUN 9; Creatinine, Ser 0.84; Hemoglobin 13.1;  Platelets 175.0; Potassium 4.1; Sodium 141   Recent Lipid Panel Lab Results  Component Value Date/Time   CHOL 99 11/26/2018 11:27 AM   TRIG 101.0 11/26/2018 11:27 AM   HDL 38.10 (L) 11/26/2018 11:27 AM   CHOLHDL 3 11/26/2018 11:27 AM   LDLCALC 40 11/26/2018 11:27 AM   LDLDIRECT 100.0 05/17/2017 01:49 PM    Wt Readings from Last 3 Encounters:  08/13/19 229 lb 8 oz (104.1 kg)  07/26/19 230 lb (104.3 kg)  07/17/19 233 lb 9.6 oz (106 kg)     Exam:    Vital Signs: Vital signs may also be detailed in the HPI BP 118/66 (BP Location: Left Arm, Patient Position: Sitting, Cuff Size: Large)   Pulse 76   Ht 5' 10.5" (1.791 m)   Wt 229 lb 8 oz (104.1 kg)   SpO2 98%   BMI 32.46 kg/m    Constitutional:  oriented to person, place, and time. No distress.  HENT:  Head: Grossly normal Eyes:  no discharge. No scleral icterus.  Neck: No JVD, no carotid bruits  Cardiovascular: Regular rate and rhythm, no murmurs appreciated Pulmonary/Chest: Clear to auscultation bilaterally, no wheezes or rails Abdominal: Soft.  no distension.  no tenderness.  Musculoskeletal: Normal range of motion Neurological:  normal muscle tone. Coordination normal. No atrophy Skin: Skin warm and dry Psychiatric: normal affect, pleasant   ASSESSMENT & PLAN:    Coronary artery disease of native artery of native heart with stable angina pectoris (HCC) No further episodes of near syncope or syncope Currently with no symptoms of angina. No further workup at this time. Continue current medication  regimen.  Granulomatosis with polyangiitis, unspecified whether renal involvement ([place]) Stable symptoms  Morbid obesity ([place]) Weight stable, recommended regular walking program Reports he feels well and is active  Essential hypertension Blood pressure is well controlled on today's visit. No changes made to the medications.  Shortness of breath Recommend walking for conditioning  Ischemic cardiomyopathy He is on several medications for cardiomyopathy We will not advance his medications given prior history of near syncope/ syncope Echocardiogram offered, he prefers to wait several months to do it in the summer He would like to know what his ejection fraction is  Near syncope Etiology unclear, unable to exclude vasovagal event No further episodes   Disposition: Follow-up in 12 months   Signed, Ida Rogue, MD  08/13/2019 1:23 PM    [place] [facility] [id number], [place], [place] [address]

## 2019-08-12 ENCOUNTER — Telehealth: Payer: Medicare HMO

## 2019-08-13 ENCOUNTER — Ambulatory Visit (INDEPENDENT_AMBULATORY_CARE_PROVIDER_SITE_OTHER): Payer: Medicare HMO | Admitting: Cardiovascular Disease

## 2019-08-13 ENCOUNTER — Other Ambulatory Visit: Payer: Self-pay

## 2019-08-13 ENCOUNTER — Encounter: Payer: Self-pay | Admitting: Cardiovascular Disease

## 2019-08-13 VITALS — BP 118/66 | HR 76 | Ht 70.5 in | Wt 229.5 lb

## 2019-08-13 DIAGNOSIS — E782 Mixed hyperlipidemia: Secondary | ICD-10-CM | POA: Diagnosis not present

## 2019-08-13 DIAGNOSIS — M3130 Wegener's granulomatosis without renal involvement: Secondary | ICD-10-CM | POA: Diagnosis not present

## 2019-08-13 DIAGNOSIS — I255 Ischemic cardiomyopathy: Secondary | ICD-10-CM | POA: Diagnosis not present

## 2019-08-13 DIAGNOSIS — I25118 Atherosclerotic heart disease of native coronary artery with other forms of angina pectoris: Secondary | ICD-10-CM | POA: Diagnosis not present

## 2019-08-13 DIAGNOSIS — R0602 Shortness of breath: Secondary | ICD-10-CM | POA: Diagnosis not present

## 2019-08-13 DIAGNOSIS — E11319 Type 2 diabetes mellitus with unspecified diabetic retinopathy without macular edema: Secondary | ICD-10-CM | POA: Diagnosis not present

## 2019-08-13 DIAGNOSIS — I10 Essential (primary) hypertension: Secondary | ICD-10-CM | POA: Diagnosis not present

## 2019-08-13 NOTE — Patient Instructions (Addendum)
Medication Instructions:  No changes  If you need a refill on your cardiac medications before your next appointment, please call your pharmacy.    Lab work: No new labs needed   If you have labs (blood work) drawn today and your tests are completely normal, you will receive your results only by: Marland Kitchen MyChart Message (if you have MyChart) OR . A paper copy in the mail If you have any lab test that is abnormal or we need to change your treatment, we will call you to review the results.   Testing/Procedures: Echo for cardiomyopthy in June 2021  Your physician has requested that you have an echocardiogram. Echocardiography is a painless test that uses sound waves to create images of your heart. It provides your doctor with information about the size and shape of your heart and how well your heart's chambers and valves are working. This procedure takes approximately one hour. There are no restrictions for this procedure.     Follow-Up: At [facility], you and your health needs are our priority.  As part of our continuing mission to provide you with exceptional heart care, we have created designated Provider Care Teams.  These Care Teams include your primary Cardiologist (physician) and Advanced Practice Providers (APPs -  Physician Assistants and Nurse Practitioners) who all work together to provide you with the care you need, when you need it.  . You will need a follow up appointment in 12 months   . Providers on your designated Care Team:   . Murray Hodgkins, NP . Christell Faith, PA-C . Marrianne Mood, PA-C  Any Other Special Instructions Will Be Listed Below (If Applicable).  For educational health videos Log in to : [email or web address] Or : [email or web address], password : [email or web address]

## 2019-08-16 ENCOUNTER — Other Ambulatory Visit: Payer: Self-pay | Admitting: Cardiovascular Disease

## 2019-08-16 NOTE — Telephone Encounter (Signed)
Patient calling to request 90 day refill for this at [facility] on [address]

## 2019-08-19 DIAGNOSIS — E119 Type 2 diabetes mellitus without complications: Secondary | ICD-10-CM | POA: Diagnosis not present

## 2019-08-19 DIAGNOSIS — S61112D Laceration without foreign body of left thumb with damage to nail, subsequent encounter: Secondary | ICD-10-CM | POA: Diagnosis not present

## 2019-08-29 ENCOUNTER — Telehealth: Payer: Self-pay | Admitting: Cardiovascular Disease

## 2019-08-29 NOTE — Telephone Encounter (Signed)
Pt c/o medication issue:  1. Name of Medication: Entresto  2. How are you currently taking this medication (dosage and times per day)? 24-26 mg bid  3. Are you having a reaction (difficulty breathing--STAT)? no  4. What is your medication issue? Patient is confused about appropriate dosage  Please advise

## 2019-08-29 NOTE — Telephone Encounter (Signed)
Spoke with the patient. Patient sts that he was looking at an old medication list that Entresto 24/26 mg bid was listed.  Advised the patient that when he was seen by Dr. Rockey Situ on 08/13/19 Entresto 49/51 mg bid was listed  On his medication list and Dr. Rockey Situ made no changes.  Patient realized the mistake and confirmed he is taking Entresto 49/51 mg bid.  No further action required. Patient voiced appreciation for the call back.

## 2019-08-31 ENCOUNTER — Telehealth: Payer: Self-pay

## 2019-08-31 DIAGNOSIS — E785 Hyperlipidemia, unspecified: Secondary | ICD-10-CM

## 2019-08-31 DIAGNOSIS — I10 Essential (primary) hypertension: Secondary | ICD-10-CM

## 2019-08-31 NOTE — Chronic Care Management (AMB) (Signed)
Chronic Care Management Pharmacy  Name: Ryan Morgan  MRN: [id number] DOB: 10-16-1940   Chief Complaint/ HPI  Ryan Morgan,  79 y.o. , male presents for their Initial CCM visit with the clinical pharmacist via telephone.  PCP : Abner Greenspan, MD  Their chronic conditions include: hypertension, hyperlipidemia, heart failure, diabetes, CAD, osteopenia  Patient concerns: denies medication concerns  Office Visits:  12/04/18: [facility] - take 2000 IU vitamin D daily, watch carbs  Consult Visit:  08/19/19: Laceration of thumb - doing well  08/13/19:Cardiology - continue current medications   07/17/19: Endocrinology - add acarbose, continue repaglinide   Allergies  Allergen Reactions  . Metformin And Related Itching  . Benicar Hct [Olmesartan Medoxomil-Hctz] Itching    Painful and itchy knots on palms of hands  . Bystolic [Nebivolol Hcl] Other (See Comments)    headache  . Dapsone Hives  . Lovaza [Omega-3-Acid Ethyl Esters] Other (See Comments)    Unknown reaction  . Niaspan [Niacin Er] Other (See Comments)    flushing  . Peanut-Containing Drug Products     Rash on hands from peanuts  . Septra [Bactrim] Hives   Medications: Outpatient Encounter Medications as of 09/02/2019  Medication Sig Note  . acarbose (PRECOSE) 25 MG tablet Take 0.5 tablets (12.5 mg total) by mouth 3 (three) times daily with meals.   Marland Kitchen aspirin EC 81 MG tablet Take 81 mg by mouth daily.   Marland Kitchen atorvastatin (LIPITOR) 20 MG tablet Take 1 tablet by mouth once daily   . carvedilol (COREG) 6.25 MG tablet Take 1 tablet (6.25 mg total) by mouth 2 (two) times daily with a meal.   . cetirizine (ZYRTEC) 10 MG tablet Take 10 mg by mouth 2 (two) times daily.    . clobetasol cream (TEMOVATE) [id number] % Apply 1 application topically 2 (two) times daily. To affected areas (Patient taking differently: Apply 1 application topically 2 (two) times daily as needed (itching/rash). )   . clopidogrel (PLAVIX) 75 MG tablet Take 1 tablet by  mouth once daily   . ezetimibe (ZETIA) 10 MG tablet Take 1 tablet (10 mg total) by mouth daily.   Marland Kitchen glucose blood (ONETOUCH ULTRA) test strip 1 each by Other route daily. E11.319   . LANCETS ULTRA THIN 30G MISC by Does not apply route. One Touch   . Multiple Vitamin (MULTIVITAMIN WITH MINERALS) TABS tablet Take 1 tablet by mouth daily.   . Multiple Vitamins-Minerals (AIRBORNE) TBEF Take 1 tablet by mouth daily as needed (for immune system support). Immune Health/Support   . nystatin cream (MYCOSTATIN) Apply 1 application topically 2 (two) times daily as needed for dry skin.   . repaglinide (PRANDIN) 2 MG tablet Take 1 tablet (2 mg total) by mouth 3 (three) times daily before meals.   . sacubitril-valsartan (ENTRESTO) 49-51 MG Take 1 tablet by mouth 2 (two) times daily.   Marland Kitchen spironolactone (ALDACTONE) 25 MG tablet Take 1 tablet (25 mg total) by mouth daily.   Marland Kitchen tetrahydrozoline 0.05 % ophthalmic solution Place 1-2 drops into both eyes 3 (three) times daily as needed (for dry/irritated eyes).   . Triamcinolone Acetonide (NASACORT ALLERGY 24HR NA) Place 2 sprays into the nose at bedtime as needed (allergies).    . [DISCONTINUED] nebivolol (BYSTOLIC) 5 MG tablet Take 1 tablet (5 mg total) by mouth 2 (two) times daily. 08/16/2011: headache   No facility-administered encounter medications on file as of 09/02/2019.   Current Diagnosis/Assessment: Goals    . Increase physical  activity     Starting 11/23/2018, I will continue to exercise and do outdoor activities for 3-4 hours 5 days per week.     Marland Kitchen Pharmacy Care Plan     CARE PLAN ENTRY  Current Barriers:  . Chronic Disease Management support, education, and care coordination needs related to hypertension, hyperlipidemia, heart failure, diabetes, CAD, osteopenia  Pharmacist Clinical Goal(s):  Marland Kitchen Improve blood glucose control: Goal A1c less than 7%; Goal fasting blood glucose 80-130 mg/dL; Goal 1-2 hours after meals less than 180 mg/dL; Continue to  exercise with goal of 30 minutes of moderate activity such as brisk walking 5 days per week. Limit carbohydrate intake. . Remain up to date on vaccinations. Recommend 2-dose shingles vaccine series from local pharmacy.   Interventions: . Comprehensive medication review performed  Patient Self Care Activities:  . Self administers medications as prescribed . Self-monitors blood pressure and blood glucose  Initial goal documentation      Hypertension   CMP Latest Ref Rng & Units 11/26/2018 08/24/2018 08/24/2018  Glucose 70 - 99 mg/dL 156(H) - 221(H)  BUN 6 - 23 mg/dL 9 - 12  Creatinine 0.40 - 1.50 mg/dL 0.84 0.83 0.99  Sodium 135 - 145 mEq/L 141 - 138  Potassium 3.5 - 5.1 mEq/L 4.1 - 4.7  Chloride 96 - 112 mEq/L 104 - 106  CO2 19 - 32 mEq/L 29 - 22  Calcium 8.4 - 10.5 mg/dL 8.5 - 8.9  Total Protein 6.0 - 8.3 g/dL 5.9(L) - -  Total Bilirubin 0.2 - 1.2 mg/dL 0.5 - -  Alkaline Phos 39 - 117 U/L 71 - -  AST 0 - 37 U/L 12 - -  ALT 0 - 53 U/L 13 - -   Office blood pressures are: BP Readings from Last 3 Encounters:  08/13/19 118/66  07/26/19 (!) 150/80  07/17/19 130/70   Goal < 140/90 mmHg Patient has failed these meds in the past: none reported Patient checks BP at home: occasionally  Patient home BP readings are ranging: 137/76 mmHg - little variation   Patient is currently controlled on the following medications:   Spironolactone 25 mg - 1 tablet daily  Carvedilol 6.25 mg - 1 tablet BID  We discussed: watches salt intake, avoids table salt Exercise: walking, yard work, pruning - weekly  Plan: Continue current medications  Hyperlipidemia/CAD   CBC Latest Ref Rng & Units 11/26/2018 08/24/2018 08/24/2018  WBC 4.0 - 10.5 K/uL 3.5(L) 4.9 5.8  Hemoglobin 13.0 - 17.0 g/dL 13.1 12.3(L) 12.2(L)  Hematocrit 39.0 - 52.0 % 38.3(L) 36.2(L) 36.2(L)  Platelets 150.0 - 400.0 K/uL 175.0 157 157   Lipid Panel     Component Value Date/Time   CHOL 99 11/26/2018 1127   TRIG 101.0  11/26/2018 1127   HDL 38.10 (L) 11/26/2018 1127   CHOLHDL 3 11/26/2018 1127   VLDL 20.2 11/26/2018 1127   LDLCALC 40 11/26/2018 1127   LDLDIRECT 100.0 05/17/2017 1349    LDL goal < 70 (CAD) Patient has failed these meds in past: Lovaza, Niaspan  Patient is currently controlled on the following medications:   Atorvastatin 20 mg - 1 tablet daily  Ezetimibe 10 mg - 1 tablet daily  Aspirin 81 mg - 1 tablet daily  Clopidogrel 75 mg - 1 tablet daily  We discussed: adherence, concerns  Stent placement (2000, 2003, 2018) - confirmed still on plavix and apsirin  Plan: Continue current medications  Diabetes   Followed by endocrinology Recent Relevant Labs: Lab Results  Component Value Date/Time   HGBA1C 8.4 (A) 07/17/2019 10:37 AM   HGBA1C 7.4 (A) 03/13/2019 01:22 PM   HGBA1C 8.4 (H) 11/26/2018 11:27 AM   HGBA1C 7.6 (H) 08/24/2018 09:25 PM   MICROALBUR 1.8 11/26/2018 11:27 AM   MICROALBUR <0.7 11/20/2017 11:26 AM    Goal A1c < 7% Checking BG: every morning Recent FBG Readings: 135, 152, 139, 163, 122, 145,153,175,142,151,135, 145, 91   Patient has failed these meds in past: metformin  Patient is currently uncontrolled on the following medications:   Repaglinide 2 mg - 1 tablet TID before meals  Acarbose 25 mg - 1/2 tablet TID with meals  Last diabetic eye exam:  Lab Results  Component Value Date/Time   HMDIABEYEEXA No Retinopathy 01/18/2019 12:00 AM    Last diabetic foot exam: last endocrinology visit 2021, reports no abnormalities   We discussed: reports diet from holidays carried over into beginning of 2021 resulting in high A1c, has made some dietary changes recently and sugars have improved  Plan: Continue current medications; Continue follow up with specialist. Encouraged exercise 30 minutes 5 days a week and low carbohydrate diet.   Heart Failure   Type: Systolic  Last ejection fraction: April 2019 30-35% NYHA Class: I (no actitivty limitation) AHA HF  Stage: C (Heart disease and symptoms present)  Patient has failed these meds in past: none Patient is currently controlled on the following medications:   Spironolactone 25 mg - 1 tablet daily  Entresto 49-51 mg - 1 tablet BID  Carvedilol 6.25 mg - 1 tablet BID  We discussed weighing daily; if you gain more than 3 pounds in one day or 5 pounds in one week call your doctor; denies SOB and swelling, checks ankles daily  Plan: Continue current medications   Osteopenia   Vitamin D (06/02/2014) 28 (low) Patient has failed these meds in past: none Patient is currently controlled on the following medications:   Vitamin D 2000 IU - daily  We discussed: confirms adherence  Plan: Continue current medications  Vaccines   Reviewed and discussed patient's vaccination history.    Immunization History  Administered Date(s) Administered  . Fluad Quad(high Dose 65+) 03/13/2019  . Influenza Split 05/16/2011, 02/16/2012  . Influenza, High Dose Seasonal PF 02/28/2017  . Influenza,inj,Quad PF,6+ Mos 03/05/2013, 03/10/2014, 05/20/2015, 03/30/2016, 05/10/2018  . Pneumococcal Conjugate-13 06/09/2014  . Pneumococcal Polysaccharide-23 07/15/2011  . Td 11/29/2017   Plan: Recommend patient receive Shingrix  Medication Management  Misc: clobetasol cream 0.05% BID rash, nystatin cream  OTCs: cetirizine 10 mg BID, multivitamin, airborne, tetrazhydrozolone 0.05% drops, Nasacort spray  Pharmacy/Benefits: [employer]  Adherence: pillbox, divided into morning and evening  Affordability: denies cost concerns  CCM Follow Up:  04/13/20 at 11:00 AM (telephone)   Debbora Dus, PharmD Clinical Pharmacist [facility] at [facility] [phone]

## 2019-08-31 NOTE — Telephone Encounter (Signed)
I would like to request a referral for Ryan Morgan to chronic care management pharmacy services for the following conditions:   Essential hypertension, benign  [I10]  Hyperlipidemia [E78.5]  Debbora Dus, PharmD Clinical Pharmacist [facility] at [facility] [phone]

## 2019-09-01 DIAGNOSIS — R69 Illness, unspecified: Secondary | ICD-10-CM | POA: Diagnosis not present

## 2019-09-02 ENCOUNTER — Ambulatory Visit: Payer: Medicare HMO

## 2019-09-02 DIAGNOSIS — E11311 Type 2 diabetes mellitus with unspecified diabetic retinopathy with macular edema: Secondary | ICD-10-CM

## 2019-09-02 DIAGNOSIS — E7800 Pure hypercholesterolemia, unspecified: Secondary | ICD-10-CM

## 2019-09-02 DIAGNOSIS — M8580 Other specified disorders of bone density and structure, unspecified site: Secondary | ICD-10-CM

## 2019-09-02 DIAGNOSIS — I10 Essential (primary) hypertension: Secondary | ICD-10-CM

## 2019-09-02 DIAGNOSIS — I25118 Atherosclerotic heart disease of native coronary artery with other forms of angina pectoris: Secondary | ICD-10-CM

## 2019-09-02 NOTE — Patient Instructions (Signed)
September 02, 2019  Dear Ryan Morgan,  It was a pleasure meeting you during our initial appointment on September 02, 2019. Below is a summary of the goals we discussed and components of chronic care management. Please contact me anytime with questions or concerns.   Visit Information  Goals Addressed            This Visit's Progress   . Pharmacy Care Plan       CARE PLAN ENTRY  Current Barriers:  . Chronic Disease Management support, education, and care coordination needs related to hypertension, hyperlipidemia, heart failure, diabetes, CAD, osteopenia  Pharmacist Clinical Goal(s):  Marland Kitchen Improve blood glucose control: Goal A1c less than 7%; Goal fasting blood glucose 80-130 mg/dL; Goal 1-2 hours after meals less than 180 mg/dL; Continue to exercise with goal of 30 minutes of moderate activity such as brisk walking 5 days per week. Limit carbohydrate intake. . Remain up to date on vaccinations. Recommend 2-dose shingles vaccine series from local pharmacy.   Interventions: . Comprehensive medication review performed  Patient Self Care Activities:  . Self administers medications as prescribed . Self-monitors blood pressure and blood glucose  Initial goal documentation      Ryan Morgan was given information about Chronic Care Management services today including:  1. CCM service includes personalized support from designated clinical staff supervised by his physician, including individualized plan of care and coordination with other care providers 2. 24/7 contact phone numbers for assistance for urgent and routine care needs. 3. Standard insurance, coinsurance, copays and deductibles apply for chronic care management only during months in which we provide at least 20 minutes of these services. Most insurances cover these services at 100%, however patients may be responsible for any copay, coinsurance and/or deductible if applicable. This service may help you avoid the need for more expensive  face-to-face services. 4. Only one practitioner may furnish and bill the service in a calendar month. 5. The patient may stop CCM services at any time (effective at the end of the month) by phone call to the office staff.  Patient agreed to services and verbal consent obtained.   The patient verbalized understanding of instructions provided today and agreed to receive a mailed copy of patient instruction and/or educational materials. Telephone follow up appointment with pharmacy team member scheduled for: 04/13/20 at 11:00 AM (telephone)   Debbora Dus, PharmD Clinical Pharmacist [facility] at [facility] [phone]  [place] stands for "Dietary Approaches to Stop Hypertension." The DASH eating plan is a healthy eating plan that has been shown to reduce high blood pressure (hypertension). It may also reduce your risk for type 2 diabetes, heart disease, and stroke. The DASH eating plan may also help with weight loss. What are tips for following this plan?  General guidelines  Avoid eating more than 2,300 mg (milligrams) of salt (sodium) a day. If you have hypertension, you may need to reduce your sodium intake to 1,500 mg a day.  Limit alcohol intake to no more than 1 drink a day for nonpregnant women and 2 drinks a day for men. One drink equals 12 oz of beer, 5 oz of wine, or 1 oz of hard liquor.  Work with your health care provider to maintain a healthy body weight or to lose weight. Ask what an ideal weight is for you.  Get at least 30 minutes of exercise that causes your heart to beat faster (aerobic exercise) most days of the week. Activities  may include walking, swimming, or biking.  Work with your health care provider or diet and nutrition specialist (dietitian) to adjust your eating plan to your individual calorie needs. Reading food labels   Check food labels for the amount of sodium per serving. Choose foods with less than 5 percent of the Daily  Value of sodium. Generally, foods with less than 300 mg of sodium per serving fit into this eating plan.  To find whole grains, look for the word "whole" as the first word in the ingredient list. Shopping  Buy products labeled as "low-sodium" or "no salt added."  Buy fresh foods. Avoid canned foods and premade or frozen meals. Cooking  Avoid adding salt when cooking. Use salt-free seasonings or herbs instead of table salt or sea salt. Check with your health care provider or pharmacist before using salt substitutes.  Do not fry foods. Cook foods using healthy methods such as baking, boiling, grilling, and broiling instead.  Cook with heart-healthy oils, such as olive, canola, soybean, or sunflower oil. Meal planning  Eat a balanced diet that includes: ? 5 or more servings of fruits and vegetables each day. At each meal, try to fill half of your plate with fruits and vegetables. ? Up to 6-8 servings of whole grains each day. ? Less than 6 oz of lean meat, poultry, or fish each day. A 3-oz serving of meat is about the same size as a deck of cards. One egg equals 1 oz. ? 2 servings of low-fat dairy each day. ? A serving of nuts, seeds, or beans 5 times each week. ? Heart-healthy fats. Healthy fats called Omega-3 fatty acids are found in foods such as flaxseeds and coldwater fish, like sardines, salmon, and mackerel.  Limit how much you eat of the following: ? Canned or prepackaged foods. ? Food that is high in trans fat, such as fried foods. ? Food that is high in saturated fat, such as fatty meat. ? Sweets, desserts, sugary drinks, and other foods with added sugar. ? Full-fat dairy products.  Do not salt foods before eating.  Try to eat at least 2 vegetarian meals each week.  Eat more home-cooked food and less restaurant, buffet, and fast food.  When eating at a restaurant, ask that your food be prepared with less salt or no salt, if possible. What foods are recommended? The  items listed may not be a complete list. Talk with your dietitian about what dietary choices are best for you. Grains Whole-grain or whole-wheat bread. Whole-grain or whole-wheat pasta. Brown rice. [place]. Bulgur. Whole-grain and low-sodium cereals. Pita bread. Low-fat, low-sodium crackers. Whole-wheat flour tortillas. Vegetables Fresh or frozen vegetables (raw, steamed, roasted, or grilled). Low-sodium or reduced-sodium tomato and vegetable juice. Low-sodium or reduced-sodium tomato sauce and tomato paste. Low-sodium or reduced-sodium canned vegetables. Fruits All fresh, dried, or frozen fruit. Canned fruit in natural juice (without added sugar). Meat and other protein foods Skinless chicken or [place]. Ground chicken or [place]. Pork with fat trimmed off. Fish and seafood. Egg whites. Dried beans, peas, or lentils. Unsalted nuts, nut butters, and seeds. Unsalted canned beans. Lean cuts of beef with fat trimmed off. Low-sodium, lean deli meat. Dairy Low-fat (1%) or fat-free (skim) milk. Fat-free, low-fat, or reduced-fat cheeses. Nonfat, low-sodium ricotta or cottage cheese. Low-fat or nonfat yogurt. Low-fat, low-sodium cheese. Fats and oils Soft margarine without trans fats. Vegetable oil. Low-fat, reduced-fat, or light mayonnaise and salad dressings (reduced-sodium). Canola, safflower, olive, soybean, and sunflower oils. Avocado. Seasoning  and other foods Herbs. Spices. Seasoning mixes without salt. Unsalted popcorn and pretzels. Fat-free sweets. What foods are not recommended? The items listed may not be a complete list. Talk with your dietitian about what dietary choices are best for you. Grains Baked goods made with fat, such as croissants, muffins, or some breads. Dry pasta or rice meal packs. Vegetables Creamed or fried vegetables. Vegetables in a cheese sauce. Regular canned vegetables (not low-sodium or reduced-sodium). Regular canned tomato sauce and paste (not low-sodium or  reduced-sodium). Regular tomato and vegetable juice (not low-sodium or reduced-sodium). Angie Fava. Olives. Fruits Canned fruit in a light or heavy syrup. Fried fruit. Fruit in cream or butter sauce. Meat and other protein foods Fatty cuts of meat. Ribs. Fried meat. Berniece Salines. Sausage. Bologna and other processed lunch meats. Salami. Fatback. Hotdogs. Bratwurst. Salted nuts and seeds. Canned beans with added salt. Canned or smoked fish. Whole eggs or egg yolks. Chicken or [place] with skin. Dairy Whole or 2% milk, cream, and half-and-half. Whole or full-fat cream cheese. Whole-fat or sweetened yogurt. Full-fat cheese. Nondairy creamers. Whipped toppings. Processed cheese and cheese spreads. Fats and oils Butter. Stick margarine. Lard. Shortening. Ghee. Bacon fat. Tropical oils, such as coconut, palm kernel, or palm oil. Seasoning and other foods Salted popcorn and pretzels. Onion salt, garlic salt, seasoned salt, table salt, and sea salt. Worcestershire sauce. Tartar sauce. Barbecue sauce. Teriyaki sauce. Soy sauce, including reduced-sodium. Steak sauce. Canned and packaged gravies. Fish sauce. Oyster sauce. Cocktail sauce. Horseradish that you find on the shelf. Ketchup. Mustard. Meat flavorings and tenderizers. Bouillon cubes. Hot sauce and Tabasco sauce. Premade or packaged marinades. Premade or packaged taco seasonings. Relishes. Regular salad dressings. Where to find more information:  National Heart, Lung, and [place]: https://wilson-eaton.com/  American Heart Association: www.heart.org Summary  The DASH eating plan is a healthy eating plan that has been shown to reduce high blood pressure (hypertension). It may also reduce your risk for type 2 diabetes, heart disease, and stroke.  With the DASH eating plan, you should limit salt (sodium) intake to 2,300 mg a day. If you have hypertension, you may need to reduce your sodium intake to 1,500 mg a day.  When on the DASH eating plan, aim to eat more  fresh fruits and vegetables, whole grains, lean proteins, low-fat dairy, and heart-healthy fats.  Work with your health care provider or diet and nutrition specialist (dietitian) to adjust your eating plan to your individual calorie needs. This information is not intended to replace advice given to you by your health care provider. Make sure you discuss any questions you have with your health care provider. Document Revised: 04/28/2017 Document Reviewed: 05/09/2016 Elsevier Patient Education  2020 [employer].

## 2019-09-02 NOTE — Telephone Encounter (Signed)
Referral placed.

## 2019-09-12 ENCOUNTER — Encounter (HOSPITAL_COMMUNITY): Payer: Self-pay | Admitting: Emergency Medicine

## 2019-09-12 ENCOUNTER — Emergency Department (HOSPITAL_COMMUNITY)
Admission: EM | Admit: 2019-09-12 | Discharge: 2019-09-12 | Disposition: A | Discharge status: Home / Self Care | Payer: Medicare HMO | Attending: Emergency Medicine | Admitting: Emergency Medicine

## 2019-09-12 ENCOUNTER — Other Ambulatory Visit: Payer: Self-pay

## 2019-09-12 ENCOUNTER — Emergency Department (HOSPITAL_COMMUNITY): Payer: Medicare HMO

## 2019-09-12 DIAGNOSIS — I10 Essential (primary) hypertension: Secondary | ICD-10-CM | POA: Insufficient documentation

## 2019-09-12 DIAGNOSIS — R61 Generalized hyperhidrosis: Secondary | ICD-10-CM | POA: Diagnosis not present

## 2019-09-12 DIAGNOSIS — I2510 Atherosclerotic heart disease of native coronary artery without angina pectoris: Secondary | ICD-10-CM | POA: Insufficient documentation

## 2019-09-12 DIAGNOSIS — R0789 Other chest pain: Secondary | ICD-10-CM | POA: Insufficient documentation

## 2019-09-12 DIAGNOSIS — R001 Bradycardia, unspecified: Secondary | ICD-10-CM | POA: Diagnosis not present

## 2019-09-12 DIAGNOSIS — Z7901 Long term (current) use of anticoagulants: Secondary | ICD-10-CM | POA: Diagnosis not present

## 2019-09-12 DIAGNOSIS — Z91010 Allergy to peanuts: Secondary | ICD-10-CM | POA: Diagnosis not present

## 2019-09-12 DIAGNOSIS — E119 Type 2 diabetes mellitus without complications: Secondary | ICD-10-CM | POA: Diagnosis not present

## 2019-09-12 DIAGNOSIS — R079 Chest pain, unspecified: Secondary | ICD-10-CM

## 2019-09-12 DIAGNOSIS — Z79899 Other long term (current) drug therapy: Secondary | ICD-10-CM | POA: Insufficient documentation

## 2019-09-12 LAB — BASIC METABOLIC PANEL
Anion gap: 10 (ref 5–15)
BUN: 7 mg/dL — ABNORMAL LOW (ref 8–23)
CO2: 27 mmol/L (ref 22–32)
Calcium: 9 mg/dL (ref 8.9–10.3)
Chloride: 103 mmol/L (ref 98–111)
Creatinine, Ser: 0.95 mg/dL (ref 0.61–1.24)
GFR calc Af Amer: >60 mL/min (ref >60)
GFR calc non Af Amer: >60 mL/min (ref >60)
Glucose, Bld: 188 mg/dL — ABNORMAL HIGH (ref 70–99)
Potassium: 4.5 mmol/L (ref 3.5–5.1)
Sodium: 140 mmol/L (ref 135–145)

## 2019-09-12 LAB — CBC
HCT: 36.5 % — ABNORMAL LOW (ref 39.0–52.0)
Hemoglobin: 12.4 g/dL — ABNORMAL LOW (ref 13.0–17.0)
MCH: 32.4 pg (ref 26.0–34.0)
MCHC: 34 g/dL (ref 30.0–36.0)
MCV: 95.3 fL (ref 80.0–100.0)
Platelets: 159 10*3/uL (ref 150–400)
RBC: 3.83 MIL/uL — ABNORMAL LOW (ref 4.22–5.81)
RDW: 13.1 % (ref 11.5–15.5)
WBC: 4.6 10*3/uL (ref 4.0–10.5)
nRBC: 0 % (ref 0.0–0.2)

## 2019-09-12 LAB — TROPONIN I (HIGH SENSITIVITY)
Troponin I (High Sensitivity): 4 ng/L (ref <18)
Troponin I (High Sensitivity): 4 ng/L (ref <18)

## 2019-09-12 MED ORDER — SODIUM CHLORIDE 0.9% FLUSH: 3.0000 mL | Freq: Once | INTRAVENOUS | Status: DC

## 2019-09-12 NOTE — ED Provider Notes (Signed)
[facility] Provider Note   CSN: [id number] Arrival date & time: 09/12/19  [id number]     History Chief Complaint  Patient presents with  . Chest Pain    Ryan Morgan is a 79 y.o. male.  HPI   79 year old male with chest pain.  He was standing at the stove cooking pancakes around 745 this morning when he began to feel weak all over and had a vague sensation of dizziness.  He went to sit down and became diaphoretic and had a chest pain that he describes as a dull ache in the center of his chest.  Symptoms lasted for approximately 45 minutes and have not reoccurred since then.  Up until this morning he is otherwise been in his usual state of health over the last several days.  He denies having any palpitations or dyspnea at the time of his symptoms.  He does have a history of known coronary artery disease with previous intervention.  He reports compliance with his medications.  Past Medical History:  Diagnosis Date  . Coronary artery disease    a. prior LAD stenting 2000; b. [place] 2003 LAD 40, D1 50; c. Lexiscan 02/2017: large defect of severe severity in the basal inferoseptal, basal inferior, mid inferoseptal, mid inferior and apical inferior location, high risk; d. LHC 03/29/17: LM nl, patent LAD stent w/ 40% ISR, D1 100,  OM1 90, RPDA 100 w/ L-R collats, post atrio 80% s/p PCI/DES    . Diabetes mellitus   . Hyperlipidemia   . Hypertension   . Ischemic cardiomyopathy    a. TTE 02/2017: EF 30-35%, diffuse HK, normal LV diastolic function parameters, mild AI/MR, RV systolic function normal, PASP normal  . Lung nodule    a. s/p prior thoracotomy  . Morbid obesity ([place])   . 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 Right leg pain 12/17/2018  . Ischemic cardiomyopathy 09/10/2018  . Near syncope 08/25/2018  . Hip joint effusion, left 05/03/2018  . Laceration of arm 12/04/2017  . Current chronic use  of systemic steroids 11/15/2016  . Routine general medical examination at a health care facility 11/09/2015  . Hearing loss 11/09/2015  . History of colonic polyps 11/09/2015  . Rash of hands 03/10/2015  . Colon cancer screening 06/09/2014  . Blepharitis, bilateral 06/02/2014  . Shortness of breath 03/10/2014  . Benign paroxysmal positional vertigo 12/14/2013  . Bursitis of right hip 10/17/2013  . Coronary artery disease of native artery of native heart with stable angina pectoris ([place]) 03/19/2013  . Encounter for Medicare annual wellness exam 03/05/2013  . 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 Osteopenia 03/05/2013  . Morbid obesity ([place]) 07/15/2011  . Prostate cancer screening 07/10/2011  . Cough 12/23/2010  . Hypertension 08/30/2010  . Hyperlipemia 08/30/2010  . 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 Wegener's granulomatosis ([place]) 08/30/2010    Past Surgical History:  Procedure Laterality Date  . BASAL CELL CARCINOMA EXCISION     removed from face x 3  . CARDIAC CATHETERIZATION    . CARDIAC SURGERY  2000   stent in heart  . CORONARY ANGIOPLASTY  09/22/1998   s/p stent placement; Tristar 3.0 x 18 mm Ref [id number]  . CORONARY STENT INTERVENTION N/A 03/29/2017   Procedure: CORONARY STENT INTERVENTION;  Surgeon: Wellington Hampshire, MD;  Location: [facility];  Service: Cardiovascular;  Laterality: N/A;  . LEFT HEART CATH AND CORONARY ANGIOGRAPHY N/A 03/29/2017   Procedure: LEFT HEART CATH AND CORONARY ANGIOGRAPHY;  Surgeon: Wellington Hampshire, MD;  Location: [facility];  Service: Cardiovascular;  Laterality: N/A;  . 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 ULTRASOUND GUIDANCE FOR VASCULAR ACCESS  03/29/2017   Procedure: Ultrasound Guidance For Vascular Access;  Surgeon: Wellington Hampshire, MD;  Location: [facility];  Service: Cardiovascular;;       Family History  Problem Relation  Age of Onset  . Cancer Mother        tumor on tonsil  . Stroke Maternal Grandfather   . Diabetes Neg Hx     Social History   Tobacco Use  . Smoking status: Former Smoker    Packs/day: 0.50    Years: 20.00    Pack years: 10.00    Types: Cigarettes    Quit date: 05/30/1978    Years since quitting: 41.3  . Smokeless tobacco: Never Used  Substance Use Topics  . Alcohol use: No    Alcohol/week: 0.0 standard drinks  . Drug use: No    Home Medications Prior to Admission medications   Medication Sig Start Date End Date Taking? Authorizing Provider  acarbose (PRECOSE) 25 MG tablet Take 0.5 tablets (12.5 mg total) by mouth 3 (three) times daily with meals. 07/17/19   Renato Shin, MD  aspirin EC 81 MG tablet Take 81 mg by mouth daily.    [provider]  atorvastatin (LIPITOR) 20 MG tablet Take 1 tablet by mouth once daily 12/04/18   Tower, Wynelle Fanny, MD  carvedilol (COREG) 6.25 MG tablet Take 1 tablet (6.25 mg total) by mouth 2 (two) times daily with a meal. 08/16/19   Gollan, Kathlene November, MD  cetirizine (ZYRTEC) 10 MG tablet Take 10 mg by mouth 2 (two) times daily.     [provider]  clobetasol cream (TEMOVATE) [id number] % Apply 1 application topically 2 (two) times daily. To affected areas Patient taking differently: Apply 1 application topically 2 (two) times daily as needed (itching/rash).  04/19/17   Ria Bush, MD  clopidogrel (PLAVIX) 75 MG tablet Take 1 tablet by mouth once daily 02/21/19   Minna Merritts, MD  ezetimibe (ZETIA) 10 MG tablet Take 1 tablet (10 mg total) by mouth daily. 07/04/19   Minna Merritts, MD  glucose blood (ONETOUCH ULTRA) test strip 1 each by Other route daily. E11.319 04/22/19   Renato Shin, MD  LANCETS ULTRA THIN 30G MISC by Does not apply route. One Touch    [provider]  Multiple Vitamin (MULTIVITAMIN WITH MINERALS) TABS tablet Take 1 tablet by mouth daily.    [provider]  Multiple Vitamins-Minerals (AIRBORNE)  TBEF Take 1 tablet by mouth daily as needed (for immune system support). Immune Health/Support    [provider]  nystatin cream (MYCOSTATIN) Apply 1 application topically 2 (two) times daily as needed for dry skin.    [provider]  repaglinide (PRANDIN) 2 MG tablet Take 1 tablet (2 mg total) by mouth 3 (three) times daily before meals. 03/13/19   Renato Shin, MD  sacubitril-valsartan (ENTRESTO) 49-51 MG Take 1 tablet by mouth 2 (two) times daily. 07/04/19   Minna Merritts, MD  spironolactone (ALDACTONE) 25 MG tablet Take 1 tablet (25 mg total) by mouth daily. 07/04/19   Minna Merritts, MD  tetrahydrozoline 0.05 % ophthalmic solution Place 1-2  drops into both eyes 3 (three) times daily as needed (for dry/irritated eyes).    [provider]  Triamcinolone Acetonide (NASACORT ALLERGY 24HR NA) Place 2 sprays into the nose at bedtime as needed (allergies).     [provider]  nebivolol (BYSTOLIC) 5 MG tablet Take 1 tablet (5 mg total) by mouth 2 (two) times daily. 07/15/11 05/03/18  Tower, Wynelle Fanny, MD    Allergies    Metformin and related, Benicar hct [olmesartan medoxomil-hctz], Bystolic [nebivolol hcl], Dapsone, Lovaza [omega-3-acid ethyl esters], Niaspan [niacin er], Peanut-containing drug products, and Septra [bactrim]  Review of Systems   Review of Systems All systems reviewed and negative, other than as noted in HPI.  Physical Exam Updated Vital Signs BP 129/72 (BP Location: Right Arm)   Pulse (!) 56   Temp 97.8 F (36.6 C) (Oral)   Resp 20   Ht 5' 10.5" (1.791 m)   Wt 103.4 kg   SpO2 96%   BMI 32.25 kg/m   Physical Exam Vitals and nursing note reviewed.  Constitutional:      General: He is not in acute distress.    Appearance: He is well-developed.  HENT:     Head: Normocephalic and atraumatic.  Eyes:     General:        Right eye: No discharge.        Left eye: No discharge.     Conjunctiva/sclera: Conjunctivae normal.    Cardiovascular:     Rate and Rhythm: Normal rate and regular rhythm.     Heart sounds: Normal heart sounds. No murmur. No friction rub. No gallop.   Pulmonary:     Effort: Pulmonary effort is normal. No respiratory distress.     Breath sounds: Normal breath sounds.  Abdominal:     General: There is no distension.     Palpations: Abdomen is soft.     Tenderness: There is no abdominal tenderness.  Musculoskeletal:        General: No tenderness.     Cervical back: Neck supple.  Skin:    General: Skin is warm and dry.  Neurological:     Mental Status: He is alert.  Psychiatric:        Behavior: Behavior normal.        Thought Content: Thought content normal.     ED Results / Procedures / Treatments   Labs (all labs ordered are listed, but only abnormal results are displayed) Labs Reviewed  BASIC METABOLIC PANEL - Abnormal; Notable for the following components:      Result Value   Glucose, Bld 188 (*)    BUN 7 (*)    All other components within normal limits  CBC - Abnormal; Notable for the following components:   RBC 3.83 (*)    Hemoglobin 12.4 (*)    HCT 36.5 (*)    All other components within normal limits  TROPONIN I (HIGH SENSITIVITY)  TROPONIN I (HIGH SENSITIVITY)    EKG EKG Interpretation  Date/Time:  Thursday September 12 2019 09:20:50 EDT Ventricular Rate:  57 PR Interval:    QRS Duration: 98 QT Interval:  433 QTC Calculation: 422 R Axis:   19 Text Interpretation: Sinus rhythm Inferior infarct, old similar to previous from 08/24/18 Confirmed by Virgel Manifold [phone]) on 09/12/2019 9:34:15 AM Also confirmed by Virgel Manifold [phone]), editor [address] [place] [phone])  on 09/13/2019 9:05:58 AM   Radiology No results found.   DG Chest 2 View  Result Date: 09/12/2019 CLINICAL DATA:  Chest pain and diaphoresis this morning, history coronary artery disease post stenting, diabetes mellitus, hypertension, ischemic cardiomyopathy, former smoker, Wegner's  granulomatosis EXAM: CHEST - 2 VIEW COMPARISON:  06/02/2017 FINDINGS: Upper normal heart size. Tortuous aorta. Pulmonary vascularity normal. Lungs clear. No pulmonary infiltrate, pleural effusion, pneumothorax or acute osseous findings. IMPRESSION: No acute abnormalities. Electronically Signed   By: Lavonia Dana M.D.   On: 09/12/2019 10:21    Procedures Procedures (including critical care time)  Medications Ordered in ED Medications  sodium chloride flush (NS) 0.9 % injection 3 mL (has no administration in time range)    ED Course  I have reviewed the triage vital signs and the nursing notes.  Pertinent labs & imaging results that were available during my care of the patient were reviewed by me and considered in my medical decision making (see chart for details).    MDM Rules/Calculators/A&P                      79 year old male with chest pain, now resolved.  Some concerning features.  Known CAD.  EKG with no overt ischemic changes and stable from the last one approximately 1 year ago.  He has had 2 troponins at this point which were both normal.  He has remained symptom-free since the time of arrival.  Possibly near syncope of unknown etiology, but does not appear to be from an emergent process.  He is hemodynamically stable.  Not toxic.  I feel is appropriate discharge at this time.  Outpatient follow-up.  Return precautions were discussed.  Final Clinical Impression(s) / ED Diagnoses Final diagnoses:  None    Rx / DC Orders ED Discharge Orders    None       Virgel Manifold, MD 09/15/19 [id number]

## 2019-09-12 NOTE — ED Notes (Signed)
Hooked patient back up to the monitor after bathroom patient is resting with call bell in reach and family at bedside

## 2019-09-12 NOTE — ED Triage Notes (Signed)
Pt here with ems from home with co chest pain tht went across his chest and was a dull pain. EMS gave 324 of aspirin and pt is pain free at this time.

## 2019-09-12 NOTE — ED Notes (Signed)
Patient given discharge instructions patient verbalizes understanding. 

## 2019-09-16 ENCOUNTER — Encounter: Payer: Self-pay | Admitting: Endocrinology

## 2019-09-16 ENCOUNTER — Other Ambulatory Visit: Payer: Self-pay

## 2019-09-16 ENCOUNTER — Ambulatory Visit: Payer: Medicare HMO | Admitting: Endocrinology

## 2019-09-16 VITALS — BP 108/64 | HR 81 | Ht 70.5 in | Wt 232.0 lb

## 2019-09-16 DIAGNOSIS — E11311 Type 2 diabetes mellitus with unspecified diabetic retinopathy with macular edema: Secondary | ICD-10-CM

## 2019-09-16 LAB — POCT GLYCOSYLATED HEMOGLOBIN (HGB A1C): Hemoglobin A1C: 7.7 % — AB (ref 4.0–5.6)

## 2019-09-16 MED ORDER — ACARBOSE 25 MG PO TABS: 25.0000 mg | ORAL_TABLET | Freq: Three times a day (TID) | ORAL | 3 refills | Status: DC

## 2019-09-16 NOTE — Patient Instructions (Addendum)
I have sent a prescription to your pharmacy, to increase the acarbose.  Please continue the same repaglinide. check your blood sugar once a day.  vary the time of day when you check, between before the 3 meals, and at bedtime.  also check if you have symptoms of your blood sugar being too high or too low.  please keep a record of the readings and bring it to your next appointment here (or you can bring the meter itself).  You can write it on any piece of paper.  please call us sooner if your blood sugar goes below 70, or if you have a lot of readings over 200.  Please come back for a follow-up appointment in 2 months.

## 2019-09-16 NOTE — Progress Notes (Signed)
Subjective:    Patient ID: Ryan Morgan, male    DOB: 07/19/1940, 79 y.o.   MRN: [id number]  HPI Pt returns for f/u of diabetes mellitus:  DM type: 2 Dx'ed: [id number] Complications: PN, DR, and CAD.   Therapy: 2 oral meds DKA: never Severe hypoglycemia: never.   Pancreatitis: never Pancreatic imaging: never SDOH: he cannot afford brand name meds.  Other: he takes intermittent prednisone for Wegener's granulomatosis; he did not tolerate metformin (rash).  Interval history: No recent steroids.  He says cbg varies from 97-260.  It is in general higher as the day goes on. He takes meds as rx'ed.  Past Medical History:  Diagnosis Date  . Coronary artery disease    a. prior LAD stenting 2000; b. [place] 2003 LAD 40, D1 50; c. Lexiscan 02/2017: large defect of severe severity in the basal inferoseptal, basal inferior, mid inferoseptal, mid inferior and apical inferior location, high risk; d. LHC 03/29/17: LM nl, patent LAD stent w/ 40% ISR, D1 100,  OM1 90, RPDA 100 w/ L-R collats, post atrio 80% s/p PCI/DES    . Diabetes mellitus   . Hyperlipidemia   . Hypertension   . Ischemic cardiomyopathy    a. TTE 02/2017: EF 30-35%, diffuse HK, normal LV diastolic function parameters, mild AI/MR, RV systolic function normal, PASP normal  . Lung nodule    a. s/p prior thoracotomy  . Morbid obesity ([place])   . 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 BASAL CELL CARCINOMA EXCISION     removed from face x 3  . CARDIAC CATHETERIZATION    . CARDIAC SURGERY  2000   stent in heart  . CORONARY ANGIOPLASTY  09/22/1998   s/p stent placement; Tristar 3.0 x 18 mm Ref [id number]  . CORONARY STENT INTERVENTION N/A 03/29/2017   Procedure: CORONARY STENT INTERVENTION;  Surgeon: Wellington Hampshire, MD;  Location: [facility];  Service: Cardiovascular;  Laterality: N/A;  . LEFT HEART CATH AND CORONARY ANGIOGRAPHY N/A 03/29/2017   Procedure: LEFT HEART CATH AND  CORONARY ANGIOGRAPHY;  Surgeon: Wellington Hampshire, MD;  Location: [facility];  Service: Cardiovascular;  Laterality: N/A;  . 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 ULTRASOUND GUIDANCE FOR VASCULAR ACCESS  03/29/2017   Procedure: Ultrasound Guidance For Vascular Access;  Surgeon: Wellington Hampshire, MD;  Location: [facility];  Service: Cardiovascular;;    Social History   Socioeconomic History  . Marital status: Married    Spouse name: Not on file  . Number of children: Not on file  . Years of education: Not on file  . Highest education level: Not on file  Occupational History  . Not on file  Tobacco Use  . Smoking status: Former Smoker    Packs/day: 0.50    Years: 20.00    Pack years: 10.00    Types: Cigarettes    Quit date: 05/30/1978    Years since quitting: 41.3  . Smokeless tobacco: Never Used  Substance and Sexual Activity  . Alcohol use: No    Alcohol/week: 0.0 standard drinks  . Drug use: No  . Sexual activity: Not Currently  Other Topics Concern  . Not on file  Social History Narrative   Regular exercise: yes   Caffeine use: very little   Social Determinants of Health   Financial Resource Strain:   . Difficulty of Paying Living  Expenses:   Food Insecurity:   . Worried About Charity fundraiser in the Last Year:   . Arboriculturist in the Last Year:   Transportation Needs:   . Film/video editor (Medical):   Marland Kitchen Lack of Transportation (Non-Medical):   Physical Activity:   . Days of Exercise per Week:   . Minutes of Exercise per Session:   Stress:   . Feeling of Stress :   Social Connections:   . Frequency of Communication with Friends and Family:   . Frequency of Social Gatherings with Friends and Family:   . Attends Religious Services:   . Active Member of Clubs or Organizations:   . Attends Archivist Meetings:   Marland Kitchen Marital Status:   Intimate Partner Violence:   . Fear of Current or Ex-Partner:   .  Emotionally Abused:   Marland Kitchen Physically Abused:   . Sexually Abused:     Current Outpatient Medications on File Prior to Visit  Medication Sig Dispense Refill  . aspirin EC 81 MG tablet Take 81 mg by mouth daily.    Marland Kitchen atorvastatin (LIPITOR) 20 MG tablet Take 1 tablet by mouth once daily 90 tablet 3  . carvedilol (COREG) 6.25 MG tablet Take 1 tablet (6.25 mg total) by mouth 2 (two) times daily with a meal. 180 tablet 3  . cetirizine (ZYRTEC) 10 MG tablet Take 10 mg by mouth 2 (two) times daily.     . cholecalciferol (VITAMIN D3) 25 MCG (1000 UNIT) tablet Take 3,000 Units by mouth daily.    . clobetasol cream (TEMOVATE) [id number] % Apply 1 application topically 2 (two) times daily. To affected areas (Patient taking differently: Apply 1 application topically 2 (two) times daily as needed (itching/rash). ) 30 g 1  . clopidogrel (PLAVIX) 75 MG tablet Take 1 tablet by mouth once daily 90 tablet 3  . ezetimibe (ZETIA) 10 MG tablet Take 1 tablet (10 mg total) by mouth daily. 90 tablet 0  . glucose blood (ONETOUCH ULTRA) test strip 1 each by Other route daily. E11.319 100 each 2  . LANCETS ULTRA THIN 30G MISC by Does not apply route. One Touch    . Multiple Vitamin (MULTIVITAMIN WITH MINERALS) TABS tablet Take 1 tablet by mouth daily.    . Multiple Vitamins-Minerals (AIRBORNE) TBEF Take 1 tablet by mouth daily as needed (for immune system support). Immune Health/Support    . nystatin cream (MYCOSTATIN) Apply 1 application topically 2 (two) times daily as needed for dry skin.    . repaglinide (PRANDIN) 2 MG tablet Take 1 tablet (2 mg total) by mouth 3 (three) times daily before meals. 270 tablet 3  . sacubitril-valsartan (ENTRESTO) 49-51 MG Take 1 tablet by mouth 2 (two) times daily. 180 tablet 0  . spironolactone (ALDACTONE) 25 MG tablet Take 1 tablet (25 mg total) by mouth daily. 90 tablet 0  . tetrahydrozoline 0.05 % ophthalmic solution Place 1-2 drops into both eyes 3 (three) times daily as needed (for  dry/irritated eyes).    . Triamcinolone Acetonide (NASACORT ALLERGY 24HR NA) Place 2 sprays into the nose at bedtime as needed (allergies).     . [DISCONTINUED] nebivolol (BYSTOLIC) 5 MG tablet Take 1 tablet (5 mg total) by mouth 2 (two) times daily. 180 tablet 3   No current facility-administered medications on file prior to visit.    Allergies  Allergen Reactions  . Metformin And Related Itching  . Benicar Hct [Olmesartan Medoxomil-Hctz] Itching  Painful and itchy knots on palms of hands  . Bystolic [Nebivolol Hcl] Other (See Comments)    headache  . Dapsone Hives  . Lovaza [Omega-3-Acid Ethyl Esters] Other (See Comments)    Unknown reaction  . Niaspan [Niacin Er] Other (See Comments)    flushing  . Peanut-Containing Drug Products     Rash on hands from peanuts  . Septra [Bactrim] Hives    Family History  Problem Relation Age of Onset  . Cancer Mother        tumor on tonsil  . Stroke Maternal Grandfather   . Diabetes Neg Hx     BP 108/64   Pulse 81   Ht 5' 10.5" (1.791 m)   Wt 232 lb (105.2 kg)   SpO2 99%   BMI 32.82 kg/m    Review of Systems He denies hypoglycemia and diarrhea.     Objective:   Physical Exam VITAL SIGNS:  See vs page GENERAL: no distress Pulses: dorsalis pedis intact bilat.   MSK: no deformity of the feet CV: 1+ bilat leg edema Skin:  no ulcer on the feet.  normal color and temp on the feet.  Neuro: sensation is intact to touch on the feet.    Ext: there is bilateral onychomycosis of the toenails.    Lab Results  Component Value Date   HGBA1C 7.7 (A) 09/16/2019   Lab Results  Component Value Date   CREATININE 0.95 09/12/2019   BUN 7 (L) 09/12/2019   NA 140 09/12/2019   K 4.5 09/12/2019   CL 103 09/12/2019   CO2 27 09/12/2019        Assessment & Plan:  Type 2 DM: he would benefit from increased rx, if it can be done with a regimen that avoids or minimizes hypoglycemia. SDOH: This limits rx options Edema: This also limits  rx options   Patient Instructions  I have sent a prescription to your pharmacy, to increase the acarbose.  Please continue the same repaglinide. check your blood sugar once a day.  vary the time of day when you check, between before the 3 meals, and at bedtime.  also check if you have symptoms of your blood sugar being too high or too low.  please keep a record of the readings and bring it to your next appointment here (or you can bring the meter itself).  You can write it on any piece of paper.  please call us sooner if your blood sugar goes below 70, or if you have a lot of readings over 200.  Please come back for a follow-up appointment in 2 months.

## 2019-10-02 ENCOUNTER — Ambulatory Visit (INDEPENDENT_AMBULATORY_CARE_PROVIDER_SITE_OTHER): Payer: Medicare HMO | Admitting: Family Medicine

## 2019-10-02 ENCOUNTER — Other Ambulatory Visit: Payer: Self-pay

## 2019-10-02 ENCOUNTER — Encounter: Payer: Self-pay | Admitting: Family Medicine

## 2019-10-02 VITALS — BP 130/80 | HR 65 | Temp 98.2°F | Ht 70.5 in | Wt 229.0 lb

## 2019-10-02 DIAGNOSIS — Z7902 Long term (current) use of antithrombotics/antiplatelets: Secondary | ICD-10-CM | POA: Diagnosis not present

## 2019-10-02 DIAGNOSIS — M25561 Pain in right knee: Secondary | ICD-10-CM | POA: Diagnosis not present

## 2019-10-02 MED ORDER — PREDNISONE 20 MG PO TABS: ORAL_TABLET | ORAL | 0 refills | Status: DC

## 2019-10-02 NOTE — Patient Instructions (Signed)
Volaren 1% gel. Over the counter You can apply up to 4 times a day Minimal is absorbed in the bloodstream Cost is about 9 dollars  

## 2019-10-02 NOTE — Progress Notes (Signed)
Ryan Adelstein T. Ryan Yung, MD, [place] at [facility] [name], [address]  Phone: [phone]  FAX: [phone]  FLAY RIBERA - 79 y.o. male  MRN [id number]  Date of Birth: 1940/06/03  Date: 10/02/2019  PCP: Abner Greenspan, MD  Referral: Abner Greenspan, MD  Chief Complaint  Patient presents with  . Knee Pain    Right x 4 days    This visit occurred during the SARS-CoV-2 public health emergency.  Safety protocols were in place, including screening questions prior to the visit, additional usage of staff PPE, and extensive cleaning of exam room while observing appropriate contact time as indicated for disinfecting solutions.   Subjective:   Ryan Morgan is a 79 y.o. very pleasant male patient with Body mass index is 32.39 kg/m. who presents with the following:  He is a very nice guy who I recall well.  For last 4 to 5 days he has had some right anterior knee pain.  He does not have any specific injury that he can recall.  Is localized to the anterior knee.  He does not have any significant medial lateral joint line pain.  Does not have any pain going up into the femur down into the tibia or fibula.  He does not have a prior history of traumatic injury or fracture or prior surgery.  He did take a step and it did feel somewhat funny to him.  No trauma.  Took a step.  Took some nsaids and that did not help.    Review of Systems is noted in the HPI, as appropriate   Objective:   BP 130/80   Pulse 65   Temp 98.2 F (36.8 C) (Temporal)   Ht 5' 10.5" (1.791 m)   Wt 229 lb (103.9 kg)   SpO2 97%   BMI 32.39 kg/m   GEN: No acute distress; alert,appropriate. PULM: Breathing comfortably in no respiratory distress PSYCH: Normally interactive.   Right knee: Full extension, flexion to 130 degrees.  Mild effusion.  Medial lateral patella facets are nontender.  Nontender along medial  lateral joint lines.  Stable to varus and valgus stress.  ACL, PCL, MCL, and LCL are all stable.  Does have some pain on the anteromedial and lateral joint lines.  He also has some plica which are not particularly painful to palpate  Radiology:   Assessment and Plan:     ICD-10-CM   1. Acute pain of right knee  M25.561   2. Platelet inhibition due to Plavix  Z79.02    Last likelihood that this is a minor OA exacerbation.  He is ambulating reasonably well, but he is walking with a limp and he had to stop twice from his car to our office.  He is on Plavix, so I would like to avoid NSAIDs.  I think it is reasonable to give him some oral prednisone and topicals.  Patient Instructions  Volaren 1% gel. Over the counter You can apply up to 4 times a day Minimal is absorbed in the bloodstream Cost is about 9 dollars     Follow-up: No follow-ups on file.  Meds ordered this encounter  Medications  . predniSONE (DELTASONE) 20 MG tablet    Sig: 2 tabs po daily for 5 days, then 1 tab po daily for 5 days    Dispense:  15 tablet    Refill:  0  There are no discontinued medications. No orders of the defined types were placed in this encounter.   Signed,  Maud Deed. Carmela Piechowski, MD   Outpatient Encounter Medications as of 10/02/2019  Medication Sig  . acarbose (PRECOSE) 25 MG tablet Take 1 tablet (25 mg total) by mouth 3 (three) times daily with meals.  Marland Kitchen aspirin EC 81 MG tablet Take 81 mg by mouth daily.  Marland Kitchen atorvastatin (LIPITOR) 20 MG tablet Take 1 tablet by mouth once daily  . carvedilol (COREG) 6.25 MG tablet Take 1 tablet (6.25 mg total) by mouth 2 (two) times daily with a meal.  . cetirizine (ZYRTEC) 10 MG tablet Take 10 mg by mouth 2 (two) times daily.   . cholecalciferol (VITAMIN D3) 25 MCG (1000 UNIT) tablet Take 3,000 Units by mouth daily.  . clobetasol cream (TEMOVATE) AB-123456789 % Apply 1 application topically 2 (two) times daily. To affected areas (Patient taking differently: Apply 1  application topically 2 (two) times daily as needed (itching/rash). )  . clopidogrel (PLAVIX) 75 MG tablet Take 1 tablet by mouth once daily  . ezetimibe (ZETIA) 10 MG tablet Take 1 tablet (10 mg total) by mouth daily.  Marland Kitchen glucose blood (ONETOUCH ULTRA) test strip 1 each by Other route daily. 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 LANCETS ULTRA THIN 30G MISC by Does not apply route. One Touch  . Multiple Vitamin (MULTIVITAMIN WITH MINERALS) TABS tablet Take 1 tablet by mouth daily.  . Multiple Vitamins-Minerals (AIRBORNE) TBEF Take 1 tablet by mouth daily as needed (for immune system support). Immune Health/Support  . nystatin cream (MYCOSTATIN) Apply 1 application topically 2 (two) times daily as needed for dry skin.  . repaglinide (PRANDIN) 2 MG tablet Take 1 tablet (2 mg total) by mouth 3 (three) times daily before meals.  . sacubitril-valsartan (ENTRESTO) 49-51 MG Take 1 tablet by mouth 2 (two) times daily.  Marland Kitchen spironolactone (ALDACTONE) 25 MG tablet Take 1 tablet (25 mg total) by mouth daily.  Marland Kitchen tetrahydrozoline 0.05 % ophthalmic solution Place 1-2 drops into both eyes 3 (three) times daily as needed (for dry/irritated eyes).  . Triamcinolone Acetonide (NASACORT ALLERGY 24HR NA) Place 2 sprays into the nose at bedtime as needed (allergies).   . predniSONE (DELTASONE) 20 MG tablet 2 tabs po daily for 5 days, then 1 tab po daily for 5 days  . [DISCONTINUED] nebivolol (BYSTOLIC) 5 MG tablet Take 1 tablet (5 mg total) by mouth 2 (two) times daily.   No facility-administered encounter medications on file as of 10/02/2019.

## 2019-10-15 ENCOUNTER — Other Ambulatory Visit: Payer: Self-pay | Admitting: Cardiovascular Disease

## 2019-10-16 DIAGNOSIS — R69 Illness, unspecified: Secondary | ICD-10-CM | POA: Diagnosis not present

## 2019-10-23 ENCOUNTER — Ambulatory Visit: Payer: Medicare HMO | Admitting: Family Medicine

## 2019-10-23 ENCOUNTER — Telehealth: Payer: Self-pay

## 2019-10-23 MED ORDER — PREDNISONE 20 MG PO TABS: ORAL_TABLET | ORAL | 0 refills | Status: DC

## 2019-10-23 NOTE — Telephone Encounter (Signed)
Pt' s wife  notified as instructed and pt voiced understanding.

## 2019-10-23 NOTE — Telephone Encounter (Signed)
[facility] - Client Nonclinical Telephone Record AccessNurse Client [facility] - Client Client Site [place] Physician Owens Loffler - MD Contact Type Call Who Is Calling Patient / Member / Family / Caregiver Caller Name Daryk Bohanan Caller Phone Number [phone] Patient Name Ryan Morgan Patient DOB 01-21-1941 Call Type Message Only Information Provided Reason for Call Request for General Office Information Initial Comment Caller wants to know if Dr. Lorelei Pont was going to be in tomorrow. Additional Comment provided office hours Disp. Time Disposition Final User 10/22/2019 5:04:26 PM General Information Provided Yes Apolinar Junes Call Closed By: Apolinar Junes Transaction Date/Time: 10/22/2019 5:01:03 PM (ET)

## 2019-10-23 NOTE — Telephone Encounter (Signed)
I sent the prednisone F/u with Dr Lorelei Pont as planned  Keep [place] posted

## 2019-10-23 NOTE — Telephone Encounter (Signed)
Pt right knee is hurting badly and can bearly walk; while pt was taking prednisone pain was better but when he finished prednisone the pain was back and now the pain level has worsened to 8. Pt did not get the Voltaren gel (he forgot). Dr Lorelei Pont does not have any available appts today and cannot add on anyone else. Pt already has appt with DR Copland on 10/30/19 at 10:40 (next day Dr Lorelei Pont will be in office). Dr Glori Bickers said could send note to her and she may possibly be able to send med to [place]. Dr Glori Bickers also wants pt to get Voltaren 1% Gel OTC and pt is aware can apply up to 4 times a day.  Pt will ck with [facility] [address] later today to ck on rx. UC & ED precautions given and pt voiced understanding. Pt appreciative.

## 2019-10-30 ENCOUNTER — Other Ambulatory Visit: Payer: Self-pay

## 2019-10-30 ENCOUNTER — Ambulatory Visit (INDEPENDENT_AMBULATORY_CARE_PROVIDER_SITE_OTHER): Payer: Medicare HMO | Admitting: Family Medicine

## 2019-10-30 ENCOUNTER — Encounter: Payer: Self-pay | Admitting: Family Medicine

## 2019-10-30 VITALS — BP 110/70 | HR 66 | Temp 97.5°F | Ht 70.5 in | Wt 228.2 lb

## 2019-10-30 DIAGNOSIS — M25561 Pain in right knee: Secondary | ICD-10-CM | POA: Diagnosis not present

## 2019-10-30 DIAGNOSIS — Z7902 Long term (current) use of antithrombotics/antiplatelets: Secondary | ICD-10-CM

## 2019-10-30 NOTE — Progress Notes (Signed)
Deaunte Dente T. Gwyndolyn Guilford, MD, [place] at [facility] [place], [address]  Phone: [phone]  FAX: [phone]  Ryan Morgan - 79 y.o. male  MRN [id number]  Date of Birth: June 09, 1940  Date: 10/30/2019  PCP: Abner Greenspan, MD  Referral: Abner Greenspan, MD  Chief Complaint  Patient presents with  . Knee Pain    Right    This visit occurred during the SARS-CoV-2 public health emergency.  Safety protocols were in place, including screening questions prior to the visit, additional usage of staff PPE, and extensive cleaning of exam room while observing appropriate contact time as indicated for disinfecting solutions.   Subjective:   Ryan Morgan is a 79 y.o. very pleasant male patient with Body mass index is 32.29 kg/m. who presents with the following:  R knee.  He is a well-known patient, and he presents today with some follow-up on an arthritis exacerbation.  At the time of his last office visit he was walking with a limp and had to stop when walking from his car to our office.  Given that he is on Plavix, and gave him some steroids as well as instructed him to get some Voltaren gel.  At this point he feels very well and has only minimal impairment.  Volt gel helps and tolerating.   Review of Systems is noted in the HPI, as appropriate   Objective:   BP 110/70   Pulse 66   Temp (!) 97.5 F (36.4 C) (Temporal)   Ht 5' 10.5" (1.791 m)   Wt 228 lb 4 oz (103.5 kg)   SpO2 98%   BMI 32.29 kg/m   Right knee: Full extension.  Flexion to 125 degrees ACL, PCL, and MCL are all stable.  Minimal to no joint line tenderness.  Nontender with loading the patellar facets.  McMurray's is negative as well as flexion pinch testing.  Radiology: No results found.  Assessment and Plan:     ICD-10-CM   1. Acute pain of right knee  M25.561   2. Platelet inhibition due to Plavix  Z79.02     Resolved knee pain.  Continue with Voltaren gel as needed.  I think that he is going to do well and encouraged him to remain active.  Follow-up: No follow-ups on file.  No orders of the defined types were placed in this encounter.  There are no discontinued medications. No orders of the defined types were placed in this encounter.   Signed,  Maud Deed. Mackey Varricchio, MD   Outpatient Encounter Medications as of 10/30/2019  Medication Sig  . acarbose (PRECOSE) 25 MG tablet Take 1 tablet (25 mg total) by mouth 3 (three) times daily with meals.  Marland Kitchen aspirin EC 81 MG tablet Take 81 mg by mouth daily.  Marland Kitchen atorvastatin (LIPITOR) 20 MG tablet Take 1 tablet by mouth once daily  . carvedilol (COREG) 6.25 MG tablet Take 1 tablet (6.25 mg total) by mouth 2 (two) times daily with a meal.  . cetirizine (ZYRTEC) 10 MG tablet Take 10 mg by mouth 2 (two) times daily.   . cholecalciferol (VITAMIN D3) 25 MCG (1000 UNIT) tablet Take 3,000 Units by mouth daily.  . clobetasol cream (TEMOVATE) AB-123456789 % Apply 1 application topically 2 (two) times daily. To affected areas (Patient taking differently: Apply 1 application topically 2 (two) times daily as needed (itching/rash). )  . clopidogrel (PLAVIX) 75  MG tablet Take 1 tablet by mouth once daily  . ENTRESTO 49-51 MG Take 1 tablet by mouth twice daily  . ezetimibe (ZETIA) 10 MG tablet Take 1 tablet by mouth once daily  . glucose blood (ONETOUCH ULTRA) test strip 1 each by Other route daily. 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 LANCETS ULTRA THIN 30G MISC by Does not apply route. One Touch  . Multiple Vitamin (MULTIVITAMIN WITH MINERALS) TABS tablet Take 1 tablet by mouth daily.  . Multiple Vitamins-Minerals (AIRBORNE) TBEF Take 1 tablet by mouth daily as needed (for immune system support). Immune Health/Support  . nystatin cream (MYCOSTATIN) Apply 1 application topically 2 (two) times daily as needed for dry skin.  . predniSONE (DELTASONE) 20 MG tablet 2 tabs po daily for 5 days, then 1 tab po  daily for 5 days  . repaglinide (PRANDIN) 2 MG tablet Take 1 tablet (2 mg total) by mouth 3 (three) times daily before meals.  Marland Kitchen spironolactone (ALDACTONE) 25 MG tablet Take 1 tablet by mouth once daily  . tetrahydrozoline 0.05 % ophthalmic solution Place 1-2 drops into both eyes 3 (three) times daily as needed (for dry/irritated eyes).  . Triamcinolone Acetonide (NASACORT ALLERGY 24HR NA) Place 2 sprays into the nose at bedtime as needed (allergies).   . [DISCONTINUED] nebivolol (BYSTOLIC) 5 MG tablet Take 1 tablet (5 mg total) by mouth 2 (two) times daily.   No facility-administered encounter medications on file as of 10/30/2019.

## 2019-11-12 ENCOUNTER — Other Ambulatory Visit: Payer: Self-pay

## 2019-11-12 ENCOUNTER — Ambulatory Visit (INDEPENDENT_AMBULATORY_CARE_PROVIDER_SITE_OTHER): Payer: Medicare HMO

## 2019-11-12 DIAGNOSIS — I255 Ischemic cardiomyopathy: Secondary | ICD-10-CM | POA: Diagnosis not present

## 2019-11-12 MED ORDER — PERFLUTREN LIPID MICROSPHERE
1.0000 mL | INTRAVENOUS | Status: AC | PRN
  Administered 2019-11-12: 2 mL via INTRAVENOUS

## 2019-11-13 DIAGNOSIS — Z85828 Personal history of other malignant neoplasm of skin: Secondary | ICD-10-CM | POA: Diagnosis not present

## 2019-11-13 DIAGNOSIS — L570 Actinic keratosis: Secondary | ICD-10-CM | POA: Diagnosis not present

## 2019-11-13 DIAGNOSIS — X32XXXA Exposure to sunlight, initial encounter: Secondary | ICD-10-CM | POA: Diagnosis not present

## 2019-11-13 DIAGNOSIS — D2261 Melanocytic nevi of right upper limb, including shoulder: Secondary | ICD-10-CM | POA: Diagnosis not present

## 2019-11-13 DIAGNOSIS — L853 Xerosis cutis: Secondary | ICD-10-CM | POA: Diagnosis not present

## 2019-11-13 DIAGNOSIS — D225 Melanocytic nevi of trunk: Secondary | ICD-10-CM | POA: Diagnosis not present

## 2019-11-13 DIAGNOSIS — D2262 Melanocytic nevi of left upper limb, including shoulder: Secondary | ICD-10-CM | POA: Diagnosis not present

## 2019-11-13 DIAGNOSIS — S1081XA Abrasion of other specified part of neck, initial encounter: Secondary | ICD-10-CM | POA: Diagnosis not present

## 2019-11-14 ENCOUNTER — Other Ambulatory Visit: Payer: Self-pay | Admitting: Family Medicine

## 2019-11-14 NOTE — Telephone Encounter (Signed)
Med refilled once and Carrie will reach out to pt to try and get appt scheduled  

## 2019-11-14 NOTE — Telephone Encounter (Signed)
Please schedule f/u or PE this summer and refill until then

## 2019-11-14 NOTE — Telephone Encounter (Signed)
Pt hasn't been seen since June of 2020 and hasn't had labs since then either. No future appts on the books. Please advise.

## 2019-11-19 ENCOUNTER — Ambulatory Visit: Payer: Medicare HMO | Admitting: Endocrinology

## 2019-12-02 ENCOUNTER — Telehealth: Payer: Self-pay | Admitting: Family Medicine

## 2019-12-02 DIAGNOSIS — Z125 Encounter for screening for malignant neoplasm of prostate: Secondary | ICD-10-CM

## 2019-12-02 DIAGNOSIS — E11311 Type 2 diabetes mellitus with unspecified diabetic retinopathy with macular edema: Secondary | ICD-10-CM

## 2019-12-02 DIAGNOSIS — I10 Essential (primary) hypertension: Secondary | ICD-10-CM

## 2019-12-02 DIAGNOSIS — E785 Hyperlipidemia, unspecified: Secondary | ICD-10-CM

## 2019-12-02 DIAGNOSIS — E1169 Type 2 diabetes mellitus with other specified complication: Secondary | ICD-10-CM

## 2019-12-02 NOTE — Telephone Encounter (Signed)
-----   Message from Cloyd Stagers, RT sent at 11/20/2019 11:28 AM EDT ----- Regarding: Lab Orders for Tuesday 7.6.2021 Please place lab orders for Tuesday 7.6.2021, office visit for physical on Tuesday 7.20.2021 There are orders in that look like CPE labs nut they are going to expire on 6.28.2021... Thanks Tam

## 2019-12-03 ENCOUNTER — Ambulatory Visit (INDEPENDENT_AMBULATORY_CARE_PROVIDER_SITE_OTHER): Payer: Medicare HMO

## 2019-12-03 ENCOUNTER — Other Ambulatory Visit (INDEPENDENT_AMBULATORY_CARE_PROVIDER_SITE_OTHER): Payer: Medicare HMO

## 2019-12-03 DIAGNOSIS — Z125 Encounter for screening for malignant neoplasm of prostate: Secondary | ICD-10-CM | POA: Diagnosis not present

## 2019-12-03 DIAGNOSIS — Z0000 Encounter for general adult medical examination without abnormal findings: Secondary | ICD-10-CM

## 2019-12-03 DIAGNOSIS — E785 Hyperlipidemia, unspecified: Secondary | ICD-10-CM

## 2019-12-03 DIAGNOSIS — I10 Essential (primary) hypertension: Secondary | ICD-10-CM

## 2019-12-03 DIAGNOSIS — E1169 Type 2 diabetes mellitus with other specified complication: Secondary | ICD-10-CM

## 2019-12-03 LAB — CBC WITH DIFFERENTIAL/PLATELET
Basophils Absolute: 0 10*3/uL (ref 0.0–0.1)
Basophils Relative: 0.7 % (ref 0.0–3.0)
Eosinophils Absolute: 0.1 10*3/uL (ref 0.0–0.7)
Eosinophils Relative: 2.2 % (ref 0.0–5.0)
HCT: 35.2 % — ABNORMAL LOW (ref 39.0–52.0)
Hemoglobin: 12.3 g/dL — ABNORMAL LOW (ref 13.0–17.0)
Lymphocytes Relative: 30.3 % (ref 12.0–46.0)
Lymphs Abs: 1.1 10*3/uL (ref 0.7–4.0)
MCHC: 34.9 g/dL (ref 30.0–36.0)
MCV: 94.7 fl (ref 78.0–100.0)
Monocytes Absolute: 0.3 10*3/uL (ref 0.1–1.0)
Monocytes Relative: 9.1 % (ref 3.0–12.0)
Neutro Abs: 2 10*3/uL (ref 1.4–7.7)
Neutrophils Relative %: 57.7 % (ref 43.0–77.0)
Platelets: 151 10*3/uL (ref 150.0–400.0)
RBC: 3.72 Mil/uL — ABNORMAL LOW (ref 4.22–5.81)
RDW: 13.6 % (ref 11.5–15.5)
WBC: 3.5 10*3/uL — ABNORMAL LOW (ref 4.0–10.5)

## 2019-12-03 LAB — LIPID PANEL
Cholesterol: 114 mg/dL (ref 0–200)
HDL: 37.2 mg/dL — ABNORMAL LOW (ref >39.00)
LDL Cholesterol: 42 mg/dL (ref 0–99)
NonHDL: 77.06
Total CHOL/HDL Ratio: 3
Triglycerides: 175 mg/dL — ABNORMAL HIGH (ref 0.0–149.0)
VLDL: 35 mg/dL (ref 0.0–40.0)

## 2019-12-03 LAB — COMPREHENSIVE METABOLIC PANEL
ALT: 12 U/L (ref 0–53)
AST: 13 U/L (ref 0–37)
Albumin: 3.9 g/dL (ref 3.5–5.2)
Alkaline Phosphatase: 62 U/L (ref 39–117)
BUN: 17 mg/dL (ref 6–23)
CO2: 29 mEq/L (ref 19–32)
Calcium: 8.7 mg/dL (ref 8.4–10.5)
Chloride: 104 mEq/L (ref 96–112)
Creatinine, Ser: 0.9 mg/dL (ref 0.40–1.50)
GFR: 81.75 mL/min (ref >60.00)
Glucose, Bld: 171 mg/dL — ABNORMAL HIGH (ref 70–99)
Potassium: 4.3 mEq/L (ref 3.5–5.1)
Sodium: 140 mEq/L (ref 135–145)
Total Bilirubin: 0.6 mg/dL (ref 0.2–1.2)
Total Protein: 5.8 g/dL — ABNORMAL LOW (ref 6.0–8.3)

## 2019-12-03 LAB — PSA, MEDICARE: PSA: 0.85 ng/ml (ref 0.10–4.00)

## 2019-12-03 LAB — TSH: TSH: 1.3 u[IU]/mL (ref 0.35–4.50)

## 2019-12-03 NOTE — Patient Instructions (Signed)
Ryan Morgan , Thank you for taking time to come for your Medicare Wellness Visit. I appreciate your ongoing commitment to your health goals. Please review the following plan we discussed and let me know if I can assist you in the future.   Screening recommendations/referrals: Colonoscopy: Up to date, completed 09/11/2014, repeat unknown Recommended yearly ophthalmology/optometry visit for glaucoma screening and checkup Recommended yearly dental visit for hygiene and checkup  Vaccinations: Influenza vaccine: Up to date, completed 03/13/2019, due 12/2019 Pneumococcal vaccine: Completed series Tdap vaccine: Up to date, completed 11/29/2017, due 11/2027 Shingles vaccine: due, check with insurance for coverage   Covid-19: Completed series  Advanced directives: Advance directive discussed with you today. Even though you declined this today please call our office should you change your mind and we can give you the proper paperwork for you to fill out.   Conditions/risks identified: diabetes, hypertension, hyperlipidemia  Next appointment: Follow up in one year for your annual wellness visit.   Preventive Care 21 Years and Older, Male Preventive care refers to lifestyle choices and visits with your health care provider that can promote health and wellness. What does preventive care include?  A yearly physical exam. This is also called an annual well check.  Dental exams once or twice a year.  Routine eye exams. Ask your health care provider how often you should have your eyes checked.  Personal lifestyle choices, including:  Daily care of your teeth and gums.  Regular physical activity.  Eating a healthy diet.  Avoiding tobacco and drug use.  Limiting alcohol use.  Practicing safe sex.  Taking low doses of aspirin every day.  Taking vitamin and mineral supplements as recommended by your health care provider. What happens during an annual well check? The services and screenings done  by your health care provider during your annual well check will depend on your age, overall health, lifestyle risk factors, and family history of disease. Counseling  Your health care provider may ask you questions about your:  Alcohol use.  Tobacco use.  Drug use.  Emotional well-being.  Home and relationship well-being.  Sexual activity.  Eating habits.  History of falls.  Memory and ability to understand (cognition).  Work and work Statistician. Screening  You may have the following tests or measurements:  Height, weight, and BMI.  Blood pressure.  Lipid and cholesterol levels. These may be checked every 5 years, or more frequently if you are over 63 years old.  Skin check.  Lung cancer screening. You may have this screening every year starting at age 43 if you have a 30-pack-year history of smoking and currently smoke or have quit within the past 15 years.  Fecal occult blood test (FOBT) of the stool. You may have this test every year starting at age 76.  Flexible sigmoidoscopy or colonoscopy. You may have a sigmoidoscopy every 5 years or a colonoscopy every 10 years starting at age 17.  Prostate cancer screening. Recommendations will vary depending on your family history and other risks.  Hepatitis C blood test.  Hepatitis B blood test.  Sexually transmitted disease (STD) testing.  Diabetes screening. This is done by checking your blood sugar (glucose) after you have not eaten for a while (fasting). You may have this done every 1-3 years.  Abdominal aortic aneurysm (AAA) screening. You may need this if you are a current or former smoker.  Osteoporosis. You may be screened starting at age 42 if you are at high risk. Talk with your  health care provider about your test results, treatment options, and if necessary, the need for more tests. Vaccines  Your health care provider may recommend certain vaccines, such as:  Influenza vaccine. This is recommended every  year.  Tetanus, diphtheria, and acellular pertussis (Tdap, Td) vaccine. You may need a Td booster every 10 years.  Zoster vaccine. You may need this after age 62.  Pneumococcal 13-valent conjugate (PCV13) vaccine. One dose is recommended after age 55.  Pneumococcal polysaccharide (PPSV23) vaccine. One dose is recommended after age 76. Talk to your health care provider about which screenings and vaccines you need and how often you need them. This information is not intended to replace advice given to you by your health care provider. Make sure you discuss any questions you have with your health care provider. Document Released: 06/12/2015 Document Revised: 02/03/2016 Document Reviewed: 03/17/2015 Elsevier Interactive Patient Education  2017 [place] Prevention in the Home Falls can cause injuries. They can happen to people of all ages. There are many things you can do to make your home safe and to help prevent falls. What can I do on the outside of my home?  Regularly fix the edges of walkways and driveways and fix any cracks.  Remove anything that might make you trip as you walk through a door, such as a raised step or threshold.  Trim any bushes or trees on the path to your home.  Use bright outdoor lighting.  Clear any walking paths of anything that might make someone trip, such as rocks or tools.  Regularly check to see if handrails are loose or broken. Make sure that both sides of any steps have handrails.  Any raised decks and porches should have guardrails on the edges.  Have any leaves, snow, or ice cleared regularly.  Use sand or salt on walking paths during winter.  Clean up any spills in your garage right away. This includes oil or grease spills. What can I do in the bathroom?  Use night lights.  Install grab bars by the toilet and in the tub and shower. Do not use towel bars as grab bars.  Use non-skid mats or decals in the tub or shower.  If you  need to sit down in the shower, use a plastic, non-slip stool.  Keep the floor dry. Clean up any water that spills on the floor as soon as it happens.  Remove soap buildup in the tub or shower regularly.  Attach bath mats securely with double-sided non-slip rug tape.  Do not have throw rugs and other things on the floor that can make you trip. What can I do in the bedroom?  Use night lights.  Make sure that you have a light by your bed that is easy to reach.  Do not use any sheets or blankets that are too big for your bed. They should not hang down onto the floor.  Have a firm chair that has side arms. You can use this for support while you get dressed.  Do not have throw rugs and other things on the floor that can make you trip. What can I do in the kitchen?  Clean up any spills right away.  Avoid walking on wet floors.  Keep items that you use a lot in easy-to-reach places.  If you need to reach something above you, use a strong step stool that has a grab bar.  Keep electrical cords out of the way.  Do not use  floor polish or wax that makes floors slippery. If you must use wax, use non-skid floor wax.  Do not have throw rugs and other things on the floor that can make you trip. What can I do with my stairs?  Do not leave any items on the stairs.  Make sure that there are handrails on both sides of the stairs and use them. Fix handrails that are broken or loose. Make sure that handrails are as long as the stairways.  Check any carpeting to make sure that it is firmly attached to the stairs. Fix any carpet that is loose or worn.  Avoid having throw rugs at the top or bottom of the stairs. If you do have throw rugs, attach them to the floor with carpet tape.  Make sure that you have a light switch at the top of the stairs and the bottom of the stairs. If you do not have them, ask someone to add them for you. What else can I do to help prevent falls?  Wear shoes  that:  Do not have high heels.  Have rubber bottoms.  Are comfortable and fit you well.  Are closed at the toe. Do not wear sandals.  If you use a stepladder:  Make sure that it is fully opened. Do not climb a closed stepladder.  Make sure that both sides of the stepladder are locked into place.  Ask someone to hold it for you, if possible.  Clearly mark and make sure that you can see:  Any grab bars or handrails.  First and last steps.  Where the edge of each step is.  Use tools that help you move around (mobility aids) if they are needed. These include:  Canes.  Walkers.  Scooters.  Crutches.  Turn on the lights when you go into a dark area. Replace any light bulbs as soon as they burn out.  Set up your furniture so you have a clear path. Avoid moving your furniture around.  If any of your floors are uneven, fix them.  If there are any pets around you, be aware of where they are.  Review your medicines with your doctor. Some medicines can make you feel dizzy. This can increase your chance of falling. Ask your doctor what other things that you can do to help prevent falls. This information is not intended to replace advice given to you by your health care provider. Make sure you discuss any questions you have with your health care provider. Document Released: 03/12/2009 Document Revised: 10/22/2015 Document Reviewed: 06/20/2014 Elsevier Interactive Patient Education  2017 [employer].

## 2019-12-03 NOTE — Progress Notes (Signed)
PCP notes:  Health Maintenance: Foot exam- due   Abnormal Screenings: none   Patient concerns: none   Nurse concerns: none   Next PCP appt.: 12/17/2019 @ 10:15 am

## 2019-12-03 NOTE — Progress Notes (Signed)
Subjective:   Ryan Morgan is a 79 y.o. male who presents for Medicare Annual/Subsequent preventive examination.  Review of Systems: N/A      I connected with the patient today by telephone and verified that I am speaking with the correct person using two identifiers. Location patient: home Location nurse: work Persons participating in the virtual visit: patient, Marine scientist.   I discussed the limitations, risks, security and privacy concerns of performing an evaluation and management service by telephone and the availability of in person appointments. I also discussed with the patient that there may be a patient responsible charge related to this service. The patient expressed understanding and verbally consented to this telephonic visit.    Interactive audio and video telecommunications were attempted between this nurse and patient, however failed, due to patient having technical difficulties OR patient did not have access to video capability.  We continued and completed visit with audio only.     Cardiac Risk Factors include: advanced age (>53men, >51 women);diabetes mellitus;hypertension;dyslipidemia;male gender     Objective:    Today's Vitals   12/03/19 1311  PainSc: 8    There is no height or weight on file to calculate BMI.  Advanced Directives 12/03/2019 09/12/2019 07/26/2019 11/23/2018 08/24/2018 08/24/2018 11/20/2017  Does Patient Have a Medical Advance Directive? No No No No No No No  Does patient want to make changes to medical advance directive? - - - - No - Patient declined No - Patient declined -  Would patient like information on creating a medical advance directive? No - Patient declined No - Patient declined No - Patient declined No - Patient declined No - Patient declined No - Patient declined Yes (MAU/Ambulatory/Procedural Areas - Information given)    Current Medications (verified) Outpatient Encounter Medications as of 12/03/2019  Medication Sig  . acarbose (PRECOSE)  25 MG tablet Take 1 tablet (25 mg total) by mouth 3 (three) times daily with meals.  Marland Kitchen aspirin EC 81 MG tablet Take 81 mg by mouth daily.  Marland Kitchen atorvastatin (LIPITOR) 20 MG tablet Take 1 tablet by mouth once daily  . carvedilol (COREG) 6.25 MG tablet Take 1 tablet (6.25 mg total) by mouth 2 (two) times daily with a meal.  . cetirizine (ZYRTEC) 10 MG tablet Take 10 mg by mouth 2 (two) times daily.   . cholecalciferol (VITAMIN D3) 25 MCG (1000 UNIT) tablet Take 3,000 Units by mouth daily.  . clobetasol cream (TEMOVATE) 7.02 % Apply 1 application topically 2 (two) times daily. To affected areas (Patient taking differently: Apply 1 application topically 2 (two) times daily as needed (itching/rash). )  . clopidogrel (PLAVIX) 75 MG tablet Take 1 tablet by mouth once daily  . ENTRESTO 49-51 MG Take 1 tablet by mouth twice daily  . ezetimibe (ZETIA) 10 MG tablet Take 1 tablet by mouth once daily  . glucose blood (ONETOUCH ULTRA) test strip 1 each by Other route daily. E11.319  . LANCETS ULTRA THIN 30G MISC by Does not apply route. One Touch  . Multiple Vitamin (MULTIVITAMIN WITH MINERALS) TABS tablet Take 1 tablet by mouth daily.  . Multiple Vitamins-Minerals (AIRBORNE) TBEF Take 1 tablet by mouth daily as needed (for immune system support). Immune Health/Support  . nystatin cream (MYCOSTATIN) Apply 1 application topically 2 (two) times daily as needed for dry skin.  . predniSONE (DELTASONE) 20 MG tablet 2 tabs po daily for 5 days, then 1 tab po daily for 5 days  . repaglinide (PRANDIN) 2 MG tablet  Take 1 tablet (2 mg total) by mouth 3 (three) times daily before meals.  Marland Kitchen spironolactone (ALDACTONE) 25 MG tablet Take 1 tablet by mouth once daily  . tetrahydrozoline 0.05 % ophthalmic solution Place 1-2 drops into both eyes 3 (three) times daily as needed (for dry/irritated eyes).  . Triamcinolone Acetonide (NASACORT ALLERGY 24HR NA) Place 2 sprays into the nose at bedtime as needed (allergies).   .  [DISCONTINUED] nebivolol (BYSTOLIC) 5 MG tablet Take 1 tablet (5 mg total) by mouth 2 (two) times daily.   No facility-administered encounter medications on file as of 12/03/2019.    Allergies (verified) Metformin and related, Benicar hct [olmesartan medoxomil-hctz], Bystolic [nebivolol hcl], Dapsone, Lovaza [omega-3-acid ethyl esters], Niaspan [niacin er], Peanut-containing drug products, and Septra [bactrim]   History: Past Medical History:  Diagnosis Date  . Coronary artery disease    a. prior LAD stenting 2000; b. [place] 2003 LAD 40, D1 50; c. Lexiscan 02/2017: large defect of severe severity in the basal inferoseptal, basal inferior, mid inferoseptal, mid inferior and apical inferior location, high risk; d. LHC 03/29/17: LM nl, patent LAD stent w/ 40% ISR, D1 100,  OM1 90, RPDA 100 w/ L-R collats, post atrio 80% s/p PCI/DES    . Diabetes mellitus   . Hyperlipidemia   . Hypertension   . Ischemic cardiomyopathy    a. TTE 02/2017: EF 30-35%, diffuse HK, normal LV diastolic function parameters, mild AI/MR, RV systolic function normal, PASP normal  . Lung nodule    a. s/p prior thoracotomy  . Morbid obesity ([place])   . 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 BASAL CELL CARCINOMA EXCISION     removed from face x 3  . CARDIAC CATHETERIZATION    . CARDIAC SURGERY  2000   stent in heart  . CORONARY ANGIOPLASTY  09/22/1998   s/p stent placement; [facility] 3.0 x 18 mm Ref [id number]  . CORONARY STENT INTERVENTION N/A 03/29/2017   Procedure: CORONARY STENT INTERVENTION;  Surgeon: Wellington Hampshire, MD;  Location: [facility];  Service: Cardiovascular;  Laterality: N/A;  . LEFT HEART CATH AND CORONARY ANGIOGRAPHY N/A 03/29/2017   Procedure: LEFT HEART CATH AND CORONARY ANGIOGRAPHY;  Surgeon: Wellington Hampshire, MD;  Location: [facility];  Service: Cardiovascular;  Laterality: N/A;  . LUNG SURGERY     no problem diagnose [place]  granulomotosis  . ULTRASOUND GUIDANCE FOR VASCULAR ACCESS  03/29/2017   Procedure: Ultrasound Guidance For Vascular Access;  Surgeon: Wellington Hampshire, MD;  Location: [facility];  Service: Cardiovascular;;   Family History  Problem Relation Age of Onset  . Cancer Mother        tumor on tonsil  . Stroke Maternal Grandfather   . Diabetes Neg Hx    Social History   Socioeconomic History  . Marital status: Married    Spouse name: Not on file  . Number of children: Not on file  . Years of education: Not on file  . Highest education level: Not on file  Occupational History  . Not on file  Tobacco Use  . Smoking status: Former Smoker    Packs/day: 0.50    Years: 20.00    Pack years: 10.00    Types: Cigarettes    Quit date: 05/30/1978    Years since quitting: 41.5  . Smokeless tobacco: Never Used  Vaping Use  . Vaping Use: Never used  Substance and Sexual Activity  .  Alcohol use: No    Alcohol/week: 0.0 standard drinks  . Drug use: No  . Sexual activity: Not Currently  Other Topics Concern  . Not on file  Social History Narrative   Regular exercise: yes   Caffeine use: very little   Social Determinants of Health   Financial Resource Strain: Low Risk   . Difficulty of Paying Living Expenses: Not hard at all  Food Insecurity: No Food Insecurity  . Worried About Charity fundraiser in the Last Year: Never true  . Ran Out of Food in the Last Year: Never true  Transportation Needs: No Transportation Needs  . Lack of Transportation (Medical): No  . Lack of Transportation (Non-Medical): No  Physical Activity: Inactive  . Days of Exercise per Week: 0 days  . Minutes of Exercise per Session: 0 min  Stress: No Stress Concern Present  . Feeling of Stress : Not at all  Social Connections:   . Frequency of Communication with Friends and Family:   . Frequency of Social Gatherings with Friends and Family:   . Attends Religious Services:   . Active Member of Clubs or  Organizations:   . Attends Archivist Meetings:   Marland Kitchen Marital Status:     Tobacco Counseling Counseling given: Not Answered   Clinical Intake:  Pre-visit preparation completed: Yes  Pain : 0-10 Pain Score: 8  Pain Type: Chronic pain Pain Location: Knee Pain Orientation: Right Pain Descriptors / Indicators: Aching Pain Onset: More than a month ago Pain Frequency: Intermittent Pain Relieving Factors: voltaren  Pain Relieving Factors: voltaren  Nutritional Risks: None Diabetes: Yes CBG done?: No Did pt. bring in CBG monitor from home?: No  How often do you need to have someone help you when you read instructions, pamphlets, or other written materials from your doctor or pharmacy?: 1 - Never What is the last grade level you completed in school?: college  Diabetic: Yes Nutrition Risk Assessment:  Has the patient had any N/V/D within the last 2 months?  No  Does the patient have any non-healing wounds?  No  Has the patient had any unintentional weight loss or weight gain?  No   Diabetes:  Is the patient diabetic?  Yes  If diabetic, was a CBG obtained today?  No  Did the patient bring in their glucometer from home?  No  How often do you monitor your CBG's? Once daily.   Financial Strains and Diabetes Management:  Are you having any financial strains with the device, your supplies or your medication? No .  Does the patient want to be seen by Chronic Care Management for management of their diabetes?  No  Would the patient like to be referred to a Nutritionist or for Diabetic Management?  No   Diabetic Exams:  Diabetic Eye Exam: Completed 01/18/2019 Diabetic Foot Exam: Overdue, Pt has been advised about the importance in completing this exam. Pt is scheduled for diabetic foot exam on 12/17/2019.   Interpreter Needed?: No  Information entered by :: CJohnson, LPN   Activities of Daily Living In your present state of health, do you have any difficulty  performing the following activities: 12/03/2019  Hearing? N  Vision? N  Difficulty concentrating or making decisions? N  Walking or climbing stairs? N  Dressing or bathing? N  Doing errands, shopping? N  Preparing Food and eating ? N  Using the Toilet? N  In the past six months, have you accidently leaked urine? N  Do  you have problems with loss of bowel control? N  Managing your Medications? N  Managing your Finances? N  Housekeeping or managing your Housekeeping? N  Some recent data might be hidden    Patient Care Team: Tower, Wynelle Fanny, MD as PCP - General (Family Medicine) Rockey Situ, Kathlene November, MD as Consulting Physician (Cardiology) Oneta Rack, MD as Consulting Physician (Dermatology) Juanito Doom, MD as Consulting Physician (Pulmonary Disease) Debbora Dus, [facility] as Pharmacist (Pharmacist)  Indicate any recent Medical Services you may have received from other than [facility] providers in the past year (date may be approximate).     Assessment:   This is a routine wellness examination for Tycho.  Hearing/Vision screen  Hearing Screening   125Hz  250Hz  500Hz  1000Hz  2000Hz  3000Hz  4000Hz  6000Hz  8000Hz   Right ear:           Left ear:           Vision Screening Comments: Patient gets annual eye exams  Dietary issues and exercise activities discussed: Current Exercise Habits: The patient does not participate in regular exercise at present, Exercise limited by: None identified  Goals    . Increase physical activity     Starting 11/23/2018, I will continue to exercise and do outdoor activities for 3-4 hours 5 days per week.     . Patient Stated     12/03/2019, I will maintain and continue medications as prescribed.     . Pharmacy Care Plan     CARE PLAN ENTRY  Current Barriers:  . Chronic Disease Management support, education, and care coordination needs related to hypertension, hyperlipidemia, heart failure, diabetes, CAD, osteopenia  Pharmacist Clinical Goal(s):   Marland Kitchen Improve blood glucose control: Goal A1c less than 7%; Goal fasting blood glucose 80-130 mg/dL; Goal 1-2 hours after meals less than 180 mg/dL; Continue to exercise with goal of 30 minutes of moderate activity such as brisk walking 5 days per week. Limit carbohydrate intake. . Remain up to date on vaccinations. Recommend 2-dose shingles vaccine series from local pharmacy.   Interventions: . Comprehensive medication review performed  Patient Self Care Activities:  . Self administers medications as prescribed . Self-monitors blood pressure and blood glucose  Initial goal documentation      Depression Screen PHQ 2/9 Scores 12/03/2019 11/23/2018 11/20/2017 11/15/2016 10/09/2015 06/09/2014 03/05/2013  PHQ - 2 Score 0 0 0 0 0 0 0  PHQ- 9 Score 0 0 0 - - - -    Fall Risk Fall Risk  12/03/2019 11/23/2018 11/20/2017 11/15/2016 10/09/2015  Falls in the past year? 1 0 No No No  Comment tripped and fell outside - - - -  Number falls in past yr: 0 - - - -  Injury with Fall? 1 - - - -  Comment head injury - - - -  Risk for fall due to : Medication side effect - - - -  Follow up Falls evaluation completed;Falls prevention discussed - - - -    Any stairs in or around the home? Yes  If so, are there any without handrails? No  Home free of loose throw rugs in walkways, pet beds, electrical cords, etc? Yes  Adequate lighting in your home to reduce risk of falls? Yes   ASSISTIVE DEVICES UTILIZED TO PREVENT FALLS:  Life alert? No  Use of a cane, walker or w/c? No  Grab bars in the bathroom? No  Shower chair or bench in shower? No  Elevated toilet seat or a handicapped toilet?  No   TIMED UP AND GO:  Was the test performed? N/A, telephonic visit .    Cognitive Function: MMSE - Mini Mental State Exam 12/03/2019 11/23/2018 11/20/2017 11/15/2016 10/09/2015  Orientation to time 5 5 5 5 5   Orientation to Place 5 5 5 5 5   Registration 3 3 3 3 3   Attention/ Calculation 5 0 0 0 0  Recall 3 3 2 3 3    Recall-comments - - unable to recall 1 of 3 words - -  Language- name 2 objects - 0 0 0 0  Language- repeat 1 1 1 1 1   Language- follow 3 step command - 0 3 3 3   Language- read & follow direction - 0 0 0 0  Write a sentence - 0 0 0 0  Copy design - 0 0 0 0  Total score - 17 19 20 20   Mini Cog  Mini-Cog screen was completed. Maximum score is 22. A value of 0 denotes this part of the MMSE was not completed or the patient failed this part of the Mini-Cog screening.       Immunizations Immunization History  Administered Date(s) Administered  . Fluad Quad(high Dose 65+) 03/13/2019  . Influenza Split 05/16/2011, 02/16/2012  . Influenza, High Dose Seasonal PF 02/28/2017  . Influenza,inj,Quad PF,6+ Mos 03/05/2013, 03/10/2014, 05/20/2015, 03/30/2016, 05/10/2018  . PFIZER SARS-COV-2 Vaccination 07/12/2019, 08/02/2019  . Pneumococcal Conjugate-13 06/09/2014  . Pneumococcal Polysaccharide-23 07/15/2011  . Td 11/29/2017    TDAP status: Up to date Flu Vaccine status: Up to date Pneumococcal vaccine status: Up to date Covid-19 vaccine status: Completed vaccines  Qualifies for Shingles Vaccine? Yes   Zostavax completed No   Shingrix Completed?: No.    Education has been provided regarding the importance of this vaccine. Patient has been advised to call insurance company to determine out of pocket expense if they have not yet received this vaccine. Advised may also receive vaccine at local pharmacy or Health Dept. Verbalized acceptance and understanding.  Screening Tests Health Maintenance  Topic Date Due  . Hepatitis C Screening  Never done  . FOOT EXAM  05/16/2019  . URINE MICROALBUMIN  11/26/2019  . INFLUENZA VACCINE  12/29/2019  . OPHTHALMOLOGY EXAM  01/18/2020  . HEMOGLOBIN A1C  03/17/2020  . TETANUS/TDAP  11/30/2027  . COVID-19 Vaccine  Completed  . PNA vac Low Risk Adult  Completed    Health Maintenance  Health Maintenance Due  Topic Date Due  . Hepatitis C Screening   Never done  . FOOT EXAM  05/16/2019  . URINE MICROALBUMIN  11/26/2019    Colorectal cancer screening: Completed 09/11/2014. Repeat unknown  Lung Cancer Screening: (Low Dose CT Chest recommended if Age 5-80 years, 30 pack-year currently smoking OR have quit w/in 15years.) does not qualify.     Additional Screening:  Hepatitis C Screening: does qualify; Completed due  Vision Screening: Recommended annual ophthalmology exams for early detection of glaucoma and other disorders of the eye. Is the patient up to date with their annual eye exam?  Yes  Who is the provider or what is the name of the office in which the patient attends annual eye exams? [facility] If pt is not established with a provider, would they like to be referred to a provider to establish care? No .   Dental Screening: Recommended annual dental exams for proper oral hygiene  Community Resource Referral / Chronic Care Management: CRR required this visit?  No   CCM required this  visit?  No      Plan:     I have personally reviewed and noted the following in the patient's chart:   . Medical and social history . Use of alcohol, tobacco or illicit drugs  . Current medications and supplements . Functional ability and status . Nutritional status . Physical activity . Advanced directives . List of other physicians . Hospitalizations, surgeries, and ER visits in previous 12 months . Vitals . Screenings to include cognitive, depression, and falls . Referrals and appointments  In addition, I have reviewed and discussed with patient certain preventive protocols, quality metrics, and best practice recommendations. A written personalized care plan for preventive services as well as general preventive health recommendations were provided to patient.   Due to this being a telephonic visit, the after visit summary with patients personalized plan was offered to patient via mail or my-chart.  Patient preferred to  pick up at office at next visit.   Andrez Grime, LPN   07/31/8327

## 2019-12-11 DIAGNOSIS — R69 Illness, unspecified: Secondary | ICD-10-CM | POA: Diagnosis not present

## 2019-12-17 ENCOUNTER — Ambulatory Visit (INDEPENDENT_AMBULATORY_CARE_PROVIDER_SITE_OTHER): Payer: Medicare HMO | Admitting: Family Medicine

## 2019-12-17 ENCOUNTER — Encounter: Payer: Self-pay | Admitting: Family Medicine

## 2019-12-17 ENCOUNTER — Other Ambulatory Visit: Payer: Self-pay

## 2019-12-17 VITALS — BP 122/76 | HR 79 | Temp 97.1°F | Ht 69.25 in | Wt 229.1 lb

## 2019-12-17 DIAGNOSIS — I25118 Atherosclerotic heart disease of native coronary artery with other forms of angina pectoris: Secondary | ICD-10-CM | POA: Diagnosis not present

## 2019-12-17 DIAGNOSIS — Z6833 Body mass index (BMI) 33.0-33.9, adult: Secondary | ICD-10-CM

## 2019-12-17 DIAGNOSIS — I10 Essential (primary) hypertension: Secondary | ICD-10-CM

## 2019-12-17 DIAGNOSIS — Z0000 Encounter for general adult medical examination without abnormal findings: Secondary | ICD-10-CM | POA: Diagnosis not present

## 2019-12-17 DIAGNOSIS — E11311 Type 2 diabetes mellitus with unspecified diabetic retinopathy with macular edema: Secondary | ICD-10-CM | POA: Diagnosis not present

## 2019-12-17 DIAGNOSIS — M3130 Wegener's granulomatosis without renal involvement: Secondary | ICD-10-CM

## 2019-12-17 DIAGNOSIS — E6609 Other obesity due to excess calories: Secondary | ICD-10-CM

## 2019-12-17 DIAGNOSIS — E1169 Type 2 diabetes mellitus with other specified complication: Secondary | ICD-10-CM

## 2019-12-17 DIAGNOSIS — M8580 Other specified disorders of bone density and structure, unspecified site: Secondary | ICD-10-CM

## 2019-12-17 DIAGNOSIS — E785 Hyperlipidemia, unspecified: Secondary | ICD-10-CM

## 2019-12-17 DIAGNOSIS — Z125 Encounter for screening for malignant neoplasm of prostate: Secondary | ICD-10-CM | POA: Diagnosis not present

## 2019-12-17 DIAGNOSIS — Z7952 Long term (current) use of systemic steroids: Secondary | ICD-10-CM

## 2019-12-17 MED ORDER — ATORVASTATIN CALCIUM 20 MG PO TABS: 20.0000 mg | ORAL_TABLET | Freq: Every day | ORAL | 3 refills | Status: DC

## 2019-12-17 NOTE — Assessment & Plan Note (Signed)
Not on prednisone currently  Was in may and June  Watching bone density and blood sugars

## 2019-12-17 NOTE — Assessment & Plan Note (Signed)
Lab Results  Component Value Date   PSA 0.85 12/03/2019   PSA 1.00 11/26/2018   PSA 0.98 11/20/2017    Has BPH -no recent changes

## 2019-12-17 NOTE — Assessment & Plan Note (Signed)
No clinical changes Continues cardiology care

## 2019-12-17 NOTE — Assessment & Plan Note (Signed)
No new developments Recent prednisone was for his knees  No lung symptoms today Continues f/u at [facility]

## 2019-12-17 NOTE — Progress Notes (Signed)
Subjective:    Patient ID: Ryan Morgan, male    DOB: 1941-01-18, 79 y.o.   MRN: [id number]  This visit occurred during the SARS-CoV-2 public health emergency.  Safety protocols were in place, including screening questions prior to the visit, additional usage of staff PPE, and extensive cleaning of exam room while observing appropriate contact time as indicated for disinfecting solutions.    HPI Here for health maintenance exam and to review chronic medical problems    Wt Readings from Last 3 Encounters:  12/17/19 229 lb 1 oz (103.9 kg)  10/30/19 228 lb 4 oz (103.5 kg)  10/02/19 229 lb (103.9 kg)  not able to do a lot of exercise due to knees - he does try to walk /plays outdoors with a great grandson  Diet has been fair  /mindful of it   33.58 kg/m  Having problems with his knees- voltaren gel helps Has seen Dr Lorelei Pont   Had a crick in his neck last week- and is improving/ is still stiff  Has tried 3-4 different pillows    Had amw on 7/6-no gaps Needs foot exam   covid immunized  Zoster status -never immunized   Hep C screening -has been screened in the past  (low risk)  Has been immunized for hep B    Eye exam 7/21 Nothing new with eyes   He sees dermatology- Dr Kellie Moor  Has a spot on back - ? 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  Goes to [facility] Not bad lately  Has had to take prednisone for knees recently    Pulse Readings from Last 3 Encounters:  12/17/19 79    10/30/19 66  10/02/19 65   Hyperlipidemia Lab Results  Component Value Date   CHOL 114 12/03/2019   CHOL 99 11/26/2018   CHOL 99 11/20/2017   Lab Results  Component Value Date   HDL 37.20 (L) 12/03/2019   HDL 38.10 (L) 11/26/2018   HDL 41.80 11/20/2017   Lab Results  Component Value Date   LDLCALC 42 12/03/2019   LDLCALC 40 11/26/2018   LDLCALC 38 11/20/2017   Lab Results  Component Value Date   TRIG 175.0 (H) 12/03/2019   TRIG 101.0 11/26/2018   TRIG 96.0 11/20/2017   Lab Results  Component Value Date   CHOLHDL 3 12/03/2019   CHOLHDL 3 11/26/2018   CHOLHDL 2 11/20/2017   Lab Results  Component Value Date   LDLDIRECT 100.0 05/17/2017   LDLDIRECT 69.0 10/09/2015   LDLDIRECT 65.7 08/13/2012   takes atorvastatin and zetia   Stable from a heart perspective   Sees endocrinology for DM2 In setting of prednisone for Wegener's dz A1C was 7.7 in April     Had more prednisone may, June  With prednisone 180-280 glucose Much better without it  Sees Dr  Loanne Drilling  Added acrabose   Osteopenia in past but last  dexa in 2018 was nl  Setting of prednisone  Falls- one fall -when he tried to move a snake on the ground- he stood up and hit his head on the underside of a table (he got confused)  He did not go to the hospital  Did have a bump/scab on back of head   Fractures - none   Lab Results  Component Value Date   WBC 3.5 (L) 12/03/2019   HGB 12.3 (L) 12/03/2019   HCT 35.2 (L) 12/03/2019   MCV 94.7 12/03/2019   PLT 151.0 12/03/2019   Lab Results  Component Value Date   CREATININE 0.90 12/03/2019   BUN 17 12/03/2019   NA 140 12/03/2019   K 4.3 12/03/2019   CL 104 12/03/2019   CO2 29 12/03/2019   Lab Results  Component Value Date   ALT 12 12/03/2019   AST 13 12/03/2019   ALKPHOS 62 12/03/2019   BILITOT 0.6 12/03/2019   glucose 171  Lab Results  Component Value Date   TSH 1.30 12/03/2019    Blood count is stable   Patient Active Problem List    Diagnosis Date Noted  . Right leg pain 12/17/2018  . Ischemic cardiomyopathy 09/10/2018  . Hip joint effusion, left 05/03/2018  . Laceration of arm 12/04/2017  . Current chronic use of systemic steroids 11/15/2016  . Routine general medical examination at a health care facility 11/09/2015  . Hearing loss 11/09/2015  . History of colonic polyps 11/09/2015  . Rash of hands 03/10/2015  . Colon cancer screening 06/09/2014  . Blepharitis, bilateral 06/02/2014  . Benign paroxysmal positional vertigo 12/14/2013  . Bursitis of right hip 10/17/2013  . Coronary artery disease of native artery of native heart with stable angina pectoris ([place]) 03/19/2013  . Encounter for Medicare annual wellness exam 03/05/2013  . 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 Osteopenia 03/05/2013  . Class 1 obesity with serious comorbidity and body mass index (BMI) of 33.0 to 33.9 in adult 07/15/2011  . Prostate cancer screening 07/10/2011  . Hypertension 08/30/2010  . Hyperlipidemia associated with type 2 diabetes mellitus ([place]) 08/30/2010  . Diabetes mellitus type 2 with retinopathy ([place]) 08/30/2010  . Wegener's granulomatosis ([place]) 08/30/2010   Past Medical History:  Diagnosis Date  . Coronary artery disease    a. prior LAD stenting 2000; b. Cornell 2003 LAD 40, D1 50; c. Lexiscan 02/2017: large defect of severe severity in the basal inferoseptal, basal inferior, mid inferoseptal, mid inferior and apical inferior location, high risk; d. LHC 03/29/17: LM nl, patent LAD stent w/ 40% ISR, D1 100,  OM1 90, RPDA 100 w/ L-R collats, post atrio 80% s/p PCI/DES    . Diabetes mellitus   . Hyperlipidemia   . Hypertension   . Ischemic cardiomyopathy    a. TTE 02/2017: EF 30-35%, diffuse HK, normal LV diastolic function parameters, mild AI/MR, RV systolic function normal, PASP normal  . Lung nodule    a. s/p prior thoracotomy  . Morbid obesity ([place])   . Wegener's granulomatosis ([place]) 2007   Past  Surgical History:  Procedure Laterality Date  . BASAL CELL CARCINOMA EXCISION     removed from face x 3  . CARDIAC CATHETERIZATION    . CARDIAC SURGERY  2000   stent in heart  . CORONARY ANGIOPLASTY  09/22/1998   s/p stent placement; [facility] 3.0 x 18 mm Ref [id number]  . CORONARY STENT INTERVENTION N/A 03/29/2017   Procedure: CORONARY STENT INTERVENTION;  Surgeon: Wellington Hampshire, MD;  Location: [place] [facility];  Service: Cardiovascular;  Laterality: N/A;  . LEFT HEART CATH AND CORONARY ANGIOGRAPHY N/A 03/29/2017   Procedure: LEFT HEART CATH AND CORONARY ANGIOGRAPHY;  Surgeon: Wellington Hampshire, MD;  Location: [place];  Service: Cardiovascular;  Laterality: N/A;  . 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 ULTRASOUND GUIDANCE FOR VASCULAR ACCESS  03/29/2017   Procedure: Ultrasound Guidance For Vascular Access;  Surgeon: Wellington Hampshire, MD;  Location: [place] [facility];  Service: Cardiovascular;;   Social History   Tobacco Use  . Smoking status: Former Smoker    Packs/day: 0.50    Years: 20.00    Pack years: 10.00    Types: Cigarettes    Quit date: 05/30/1978    Years since quitting: 41.5  . Smokeless tobacco: Never Used  Vaping Use  . Vaping Use: Never used  Substance Use Topics  . Alcohol use: No    Alcohol/week: 0.0 standard drinks  . Drug use: No   Family History  Problem Relation Age of Onset  . Cancer Mother        tumor on tonsil  . Stroke Maternal Grandfather   . Diabetes Neg Hx    Allergies  Allergen Reactions  . Metformin And Related Itching  . Benicar Hct [Olmesartan Medoxomil-Hctz] Itching    Painful and itchy knots on palms of hands  . Bystolic [Nebivolol Hcl] Other (See Comments)    headache  . Dapsone Hives  . Lovaza [Omega-3-Acid Ethyl Esters] Other (See Comments)    Unknown reaction  . Niaspan [Niacin Er] Other (See Comments)    flushing  . Peanut-Containing Drug Products     Rash on hands from peanuts  .  Septra [Bactrim] Hives   Current Outpatient Medications on File Prior to Visit  Medication Sig Dispense Refill  . acarbose (PRECOSE) 25 MG tablet Take 1 tablet (25 mg total) by mouth 3 (three) times daily with meals. 270 tablet 3  . aspirin EC 81 MG tablet Take 81 mg by mouth daily.    . carvedilol (COREG) 6.25 MG tablet Take 1 tablet (6.25 mg total) by mouth 2 (two) times daily with a meal. 180 tablet 3  . cetirizine (ZYRTEC) 10 MG tablet Take 10 mg by mouth 2 (two) times daily.     . cholecalciferol (VITAMIN D3) 25 MCG (1000 UNIT) tablet Take 3,000 Units by mouth daily.    . clobetasol cream (TEMOVATE) 5.85 % Apply 1 application topically 2 (two) times daily. To affected areas (Patient taking differently: Apply 1 application topically 2 (two) times daily as needed (itching/rash). ) 30 g 1  . clopidogrel (PLAVIX) 75 MG tablet Take 1 tablet by mouth once daily 90 tablet 3  . ENTRESTO 49-51 MG Take 1 tablet by mouth twice daily 180 tablet 2  . ezetimibe (ZETIA) 10 MG tablet Take 1 tablet by mouth once daily 90 tablet 2  . glucose blood (ONETOUCH ULTRA) test strip 1 each by Other route daily. E11.319 100 each 2  . LANCETS ULTRA THIN 30G MISC by Does not apply route. One Touch    . Multiple Vitamin (MULTIVITAMIN WITH MINERALS) TABS tablet Take 1 tablet by mouth daily.    . Multiple Vitamins-Minerals (AIRBORNE) TBEF Take 1 tablet by mouth daily as needed (for immune system support). Immune Health/Support    . nystatin cream (MYCOSTATIN) Apply 1 application topically 2 (two) times daily as needed  for dry skin.    . repaglinide (PRANDIN) 2 MG tablet Take 1 tablet (2 mg total) by mouth 3 (three) times daily before meals. 270 tablet 3  . spironolactone (ALDACTONE) 25 MG tablet Take 1 tablet by mouth once daily 90 tablet 2  . tetrahydrozoline 0.05 % ophthalmic solution Place 1-2 drops into both eyes 3 (three) times daily as needed (for dry/irritated eyes).    . Triamcinolone Acetonide (NASACORT ALLERGY  24HR NA) Place 2 sprays into the nose at bedtime as needed (allergies).     . [DISCONTINUED] nebivolol (BYSTOLIC) 5 MG tablet Take 1 tablet (5 mg total) by mouth 2 (two) times daily. 180 tablet 3   No current facility-administered medications on file prior to visit.     Review of Systems  Constitutional: Negative for activity change, appetite change, fatigue, fever and unexpected weight change.  HENT: Negative for congestion, rhinorrhea, sore throat and trouble swallowing.   Eyes: Negative for pain, redness, itching and visual disturbance.  Respiratory: Negative for cough, chest tightness, shortness of breath and wheezing.   Cardiovascular: Negative for chest pain and palpitations.  Gastrointestinal: Negative for abdominal pain, blood in stool, constipation, diarrhea and nausea.  Endocrine: Negative for cold intolerance, heat intolerance, polydipsia and polyuria.  Genitourinary: Positive for frequency. Negative for difficulty urinating, dysuria and urgency.  Musculoskeletal: Positive for arthralgias. Negative for joint swelling and myalgias.       Knee pain   Skin: Negative for pallor and rash.  Neurological: Negative for dizziness, tremors, weakness, numbness and headaches.  Hematological: Negative for adenopathy. Does not bruise/bleed easily.  Psychiatric/Behavioral: Negative for decreased concentration and dysphoric mood. The patient is not nervous/anxious.        Objective:   Physical Exam Constitutional:      General: He is not in acute distress.    Appearance: Normal appearance. He is well-developed. He is obese. He is not ill-appearing or diaphoretic.  HENT:     Head: Normocephalic and atraumatic.     Right Ear: Tympanic membrane, ear canal and external ear normal.     Left Ear: Tympanic membrane, ear canal and external ear normal.     Nose: Nose normal. No congestion.     Mouth/Throat:     Mouth: Mucous membranes are moist.     Pharynx: Oropharynx is clear. No posterior  oropharyngeal erythema.  Eyes:     General: No scleral icterus.       Right eye: No discharge.        Left eye: No discharge.     Conjunctiva/sclera: Conjunctivae normal.     Pupils: Pupils are equal, round, and reactive to light.  Neck:     Thyroid: No thyromegaly.     Vascular: No carotid bruit or JVD.  Cardiovascular:     Rate and Rhythm: Normal rate and regular rhythm.     Pulses: Normal pulses.     Heart sounds: Normal heart sounds. No gallop.   Pulmonary:     Effort: Pulmonary effort is normal. No respiratory distress.     Breath sounds: Normal breath sounds. No wheezing or rales.     Comments: Good air exch Chest:     Chest wall: No tenderness.  Abdominal:     General: Bowel sounds are normal. There is no distension or abdominal bruit.     Palpations: Abdomen is soft. There is no mass.     Tenderness: There is no abdominal tenderness.     Hernia: No hernia is present.  Musculoskeletal:        General: No tenderness.     Cervical back: Normal range of motion and neck supple. No rigidity. No muscular tenderness.     Right lower leg: No edema.     Left lower leg: No edema.     Comments: No kyphosis   Lymphadenopathy:     Cervical: No cervical adenopathy.  Skin:    General: Skin is warm and dry.     Coloration: Skin is not pale.     Findings: No erythema or rash.     Comments: sks and aks  Solar lentigines diffusely Some excoriations and abrasions on legs Site of prev tick bite on back-no erythema or swelling No rash  Neurological:     Mental Status: He is alert.     Cranial Nerves: No cranial nerve deficit.     Motor: No abnormal muscle tone.     Coordination: Coordination normal.     Gait: Gait normal.     Deep Tendon Reflexes: Reflexes are normal and symmetric. Reflexes normal.  Psychiatric:        Mood and Affect: Mood normal.        Cognition and Memory: Cognition and memory normal.     Comments: Mentally sharp           Assessment & Plan:    Problem List Items Addressed This Visit      Cardiovascular and Mediastinum   Hypertension    bp in fair control at this time  BP Readings from Last 1 Encounters:  12/17/19 122/76   No changes needed Most recent labs reviewed  Disc lifstyle change with low sodium diet and exercise         Relevant Medications   atorvastatin (LIPITOR) 20 MG tablet   Wegener's granulomatosis (HCC)    No new developments Recent prednisone was for his knees  No lung symptoms today Continues f[facility]      Relevant Medications   atorvastatin (LIPITOR) 20 MG tablet   Coronary artery disease of native artery of native heart with stable angina pectoris (HCC)    No clinical changes Continues cardiology care       Relevant Medications   atorvastatin (LIPITOR) 20 MG tablet     Endocrine   Hyperlipidemia associated with type 2 diabetes mellitus ([place])    Disc goals for lipids and reasons to control them Rev last labs with pt Rev low sat fat diet in detail LDL well controlled with statin and zetia  HDL low- baseline /enc exercise if able      Relevant Medications   atorvastatin (LIPITOR) 20 MG tablet   Diabetes mellitus type 2 with retinopathy ([place])    Seeing endocrinology  Last A1C 7.7  Taking a statin  Working more on diet  [facility]        Relevant Medications   atorvastatin (LIPITOR) 20 MG tablet     Musculoskeletal and Integument   Osteopenia    One fall (hit his head when standing) and no fractures dexa 2018 -normal bmd  Still takes prednisone on and off  Enc vit D intake - he does this  Exercise as tolerated and fall prevention          Other   Prostate cancer screening    Lab Results  Component Value Date   PSA 0.85 12/03/2019   PSA 1.00 11/26/2018   PSA 0.98 11/20/2017    Has BPH -no recent changes  Class 1 obesity with serious comorbidity and body mass index (BMI) of 33.0 to 33.9 in adult    Discussed how this problem influences  overall health and the risks it imposes  Reviewed plan for weight loss with lower calorie diet (via better food choices and also portion control or program like weight watchers) and exercise building up to or more than 30 minutes 5 days per week including some aerobic activity   Low impact exercise may help more with knee pain       Routine general medical examination at a health care facility - Primary    Reviewed health habits including diet and exercise and skin cancer prevention Reviewed appropriate screening tests for age  Also reviewed health mt list, fam hx and immunization status , as well as social and family history   See HPI Amw reviewed  covid immunized  Zoster status- may consider shingrix if covered Continues regular dermatology visits Stable psa with BPH  Last colonoscopy 2016        Current chronic use of systemic steroids    Not on prednisone currently  Was in may and June  Watching bone density and blood sugars

## 2019-12-17 NOTE — Assessment & Plan Note (Signed)
Reviewed health habits including diet and exercise and skin cancer prevention Reviewed appropriate screening tests for age  Also reviewed health mt list, fam hx and immunization status , as well as social and family history   See HPI Amw reviewed  covid immunized  Zoster status- may consider shingrix if covered Continues regular dermatology visits Stable psa with BPH  Last colonoscopy 2016

## 2019-12-17 NOTE — Assessment & Plan Note (Signed)
Disc goals for lipids and reasons to control them Rev last labs with pt Rev low sat fat diet in detail LDL well controlled with statin and zetia  HDL low- baseline /enc exercise if able

## 2019-12-17 NOTE — Assessment & Plan Note (Signed)
bp in fair control at this time  BP Readings from Last 1 Encounters:  12/17/19 122/76   No changes needed Most recent labs reviewed  Disc lifstyle change with low sodium diet and exercise

## 2019-12-17 NOTE — Assessment & Plan Note (Signed)
One fall (hit his head when standing) and no fractures dexa 2018 -normal bmd  Still takes prednisone on and off  Enc vit D intake - he does this  Exercise as tolerated and fall prevention

## 2019-12-17 NOTE — Assessment & Plan Note (Signed)
Seeing endocrinology  Last A1C 7.7  Taking a statin  Working more on diet  [facility]

## 2019-12-17 NOTE — Patient Instructions (Addendum)
If you are interested in the new shingles vaccine (Shingrix) - call your local pharmacy to check on coverage and availability  If affordable, get on a wait list at your pharmacy to get the vaccine.   For cholesterol  Exercise helps raise the HDL Also eating fish   For the bad cholesterol Avoid red meat/ fried foods/ egg yolks/ fatty breakfast meats/ butter, cheese and high fat dairy/ and shellfish    For diabetes  Try to get most of your carbohydrates from produce (with the exception of white potatoes)  Eat less bread/pasta/rice/snack foods/cereals/sweets and other items from the middle of the grocery store (processed carbs)

## 2019-12-17 NOTE — Assessment & Plan Note (Signed)
Discussed how this problem influences overall health and the risks it imposes  Reviewed plan for weight loss with lower calorie diet (via better food choices and also portion control or program like weight watchers) and exercise building up to or more than 30 minutes 5 days per week including some aerobic activity   Low impact exercise may help more with knee pain

## 2019-12-24 ENCOUNTER — Encounter: Payer: Self-pay | Admitting: Endocrinology

## 2019-12-24 ENCOUNTER — Other Ambulatory Visit: Payer: Self-pay

## 2019-12-24 ENCOUNTER — Ambulatory Visit: Payer: Medicare HMO | Admitting: Endocrinology

## 2019-12-24 VITALS — BP 104/64 | HR 66 | Ht 69.5 in | Wt 229.4 lb

## 2019-12-24 DIAGNOSIS — E11311 Type 2 diabetes mellitus with unspecified diabetic retinopathy with macular edema: Secondary | ICD-10-CM | POA: Diagnosis not present

## 2019-12-24 LAB — POCT GLYCOSYLATED HEMOGLOBIN (HGB A1C): Hemoglobin A1C: 8.1 % — AB (ref 4.0–5.6)

## 2019-12-24 MED ORDER — METFORMIN HCL ER 500 MG PO TB24
500.0000 mg | ORAL_TABLET | Freq: Every day | ORAL | 3 refills | Status: DC
Start: 2019-12-24 — End: 2020-11-28

## 2019-12-24 NOTE — Patient Instructions (Addendum)
I have sent a prescription to your pharmacy, to re-try the metformin.  Please continue the same other medications.  check your blood sugar once a day.  vary the time of day when you check, between before the 3 meals, and at bedtime.  also check if you have symptoms of your blood sugar being too high or too low.  please keep a record of the readings and bring it to your next appointment here (or you can bring the meter itself).  You can write it on any piece of paper.  please call us sooner if your blood sugar goes below 70, or if you have a lot of readings over 200.  Please come back for a follow-up appointment in 2 months.

## 2019-12-24 NOTE — Progress Notes (Signed)
Subjective:    Patient ID: Ryan Morgan, male    DOB: Feb 23, 1941, 79 y.o.   MRN: [id number]  HPI Pt returns for f/u of diabetes mellitus:  DM type: 2 Dx'ed: 8144 Complications: PN, DR, and CAD.   Therapy: 2 oral meds DKA: never Severe hypoglycemia: never.   Pancreatitis: never Pancreatic imaging: never SDOH: he cannot afford brand name meds.  Other: he takes intermittent prednisone for Wegener's granulomatosis; he did not tolerate metformin (rash).  Interval history: last took steroids 6 weeks ago  He says cbg varies from 97-260.  It is in general higher as the day goes on. He takes meds as rx'ed.   Pt says the rash with metformin might have been a coincidence, so he would like to re-try.   Past Medical History:  Diagnosis Date  . Coronary artery disease    a. prior LAD stenting 2000; b. [place] 2003 LAD 40, D1 50; c. Lexiscan 02/2017: large defect of severe severity in the basal inferoseptal, basal inferior, mid inferoseptal, mid inferior and apical inferior location, high risk; d. LHC 03/29/17: LM nl, patent LAD stent w/ 40% ISR, D1 100,  OM1 90, RPDA 100 w/ L-R collats, post atrio 80% s/p PCI/DES    . Diabetes mellitus   . Hyperlipidemia   . Hypertension   . Ischemic cardiomyopathy    a. TTE 02/2017: EF 30-35%, diffuse HK, normal LV diastolic function parameters, mild AI/MR, RV systolic function normal, PASP normal  . Lung nodule    a. s/p prior thoracotomy  . Morbid obesity ([place])   . Wegener's granulomatosis ([place]) 2007    Past Surgical History:  Procedure Laterality Date  . BASAL CELL CARCINOMA EXCISION     removed from face x 3  . CARDIAC CATHETERIZATION    . CARDIAC SURGERY  2000   stent in heart  . CORONARY ANGIOPLASTY  09/22/1998   s/p stent placement; Tristar 3.0 x 18 mm Ref [id number]  . CORONARY STENT INTERVENTION N/A 03/29/2017   Procedure: CORONARY STENT INTERVENTION;  Surgeon: Wellington Hampshire, MD;  Location: [facility];  Service: Cardiovascular;   Laterality: N/A;  . LEFT HEART CATH AND CORONARY ANGIOGRAPHY N/A 03/29/2017   Procedure: LEFT HEART CATH AND CORONARY ANGIOGRAPHY;  Surgeon: Wellington Hampshire, MD;  Location: [facility];  Service: Cardiovascular;  Laterality: N/A;  . 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 ULTRASOUND GUIDANCE FOR VASCULAR ACCESS  03/29/2017   Procedure: Ultrasound Guidance For Vascular Access;  Surgeon: Wellington Hampshire, MD;  Location: [facility];  Service: Cardiovascular;;    Social History   Socioeconomic History  . Marital status: Married    Spouse name: Not on file  . Number of children: Not on file  . Years of education: Not on file  . Highest education level: Not on file  Occupational History  . Not on file  Tobacco Use  . Smoking status: Former Smoker    Packs/day: 0.50    Years: 20.00    Pack years: 10.00    Types: Cigarettes    Quit date: 05/30/1978    Years since quitting: 41.6  . Smokeless tobacco: Never Used  Vaping Use  . Vaping Use: Never used  Substance and Sexual Activity  . Alcohol use: No    Alcohol/week: 0.0 standard drinks  . Drug use: No  . Sexual activity: Not Currently  Other Topics Concern  . Not on file  Social History Narrative   Regular exercise: yes   Caffeine use: very little   Social Determinants of Radio broadcast assistant Strain: Low Risk   . Difficulty of Paying Living Expenses: Not hard at all  Food Insecurity: No Food Insecurity  . Worried About Charity fundraiser in the Last Year: Never true  . Ran Out of Food in the Last Year: Never true  Transportation Needs: No Transportation Needs  . Lack of Transportation (Medical): No  . Lack of Transportation (Non-Medical): No  Physical Activity: Inactive  . Days of Exercise per Week: 0 days  . Minutes of Exercise per Session: 0 min  Stress: No Stress Concern Present  . Feeling of Stress : Not at all  Social Connections:   . Frequency of Communication with  Friends and Family:   . Frequency of Social Gatherings with Friends and Family:   . Attends Religious Services:   . Active Member of Clubs or Organizations:   . Attends Archivist Meetings:   Marland Kitchen Marital Status:   Intimate Partner Violence: Not At Risk  . Fear of Current or Ex-Partner: No  . Emotionally Abused: No  . Physically Abused: No  . Sexually Abused: No    Current Outpatient Medications on File Prior to Visit  Medication Sig Dispense Refill  . aspirin EC 81 MG tablet Take 81 mg by mouth daily.    Marland Kitchen atorvastatin (LIPITOR) 20 MG tablet Take 1 tablet (20 mg total) by mouth daily. 90 tablet 3  . carvedilol (COREG) 6.25 MG tablet Take 1 tablet (6.25 mg total) by mouth 2 (two) times daily with a meal. 180 tablet 3  . cetirizine (ZYRTEC) 10 MG tablet Take 10 mg by mouth 2 (two) times daily.     . cholecalciferol (VITAMIN D3) 25 MCG (1000 UNIT) tablet Take 3,000 Units by mouth daily.    . clobetasol cream (TEMOVATE) 8.25 % Apply 1 application topically 2 (two) times daily. To affected areas (Patient taking differently: Apply 1 application topically 2 (two) times daily as needed (itching/rash). ) 30 g 1  . clopidogrel (PLAVIX) 75 MG tablet Take 1 tablet by mouth once daily 90 tablet 3  . ENTRESTO 49-51 MG Take 1 tablet by mouth twice daily 180 tablet 2  . ezetimibe (ZETIA) 10 MG tablet Take 1 tablet by mouth once daily 90 tablet 2  . glucose blood (ONETOUCH ULTRA) test strip 1 each by Other route daily. E11.319 100 each 2  . LANCETS ULTRA THIN 30G MISC by Does not apply route. One Touch    . Multiple Vitamin (MULTIVITAMIN WITH MINERALS) TABS tablet Take 1 tablet by mouth daily.    . Multiple Vitamins-Minerals (AIRBORNE) TBEF Take 1 tablet by mouth daily as needed (for immune system support). Immune Health/Support    . nystatin cream (MYCOSTATIN) Apply 1 application topically 2 (two) times daily as needed for dry skin.    Marland Kitchen spironolactone (ALDACTONE) 25 MG tablet Take 1 tablet by  mouth once daily 90 tablet 2  . tetrahydrozoline 0.05 % ophthalmic solution Place 1-2 drops into both eyes 3 (three) times daily as needed (for dry/irritated eyes).    . Triamcinolone Acetonide (NASACORT ALLERGY 24HR NA) Place 2 sprays into the nose at bedtime as needed (allergies).     Marland Kitchen acarbose (PRECOSE) 25 MG tablet Take 1 tablet (25 mg total) by mouth 3 (three) times daily with meals. (Patient not taking: Reported on 12/24/2019) 270 tablet 3  . [DISCONTINUED] nebivolol (  BYSTOLIC) 5 MG tablet Take 1 tablet (5 mg total) by mouth 2 (two) times daily. 180 tablet 3   No current facility-administered medications on file prior to visit.    Allergies  Allergen Reactions  . Metformin And Related Itching  . Benicar Hct [Olmesartan Medoxomil-Hctz] Itching    Painful and itchy knots on palms of hands  . Bystolic [Nebivolol Hcl] Other (See Comments)    headache  . Dapsone Hives  . Lovaza [Omega-3-Acid Ethyl Esters] Other (See Comments)    Unknown reaction  . Niaspan [Niacin Er] Other (See Comments)    flushing  . Peanut-Containing Drug Products     Rash on hands from peanuts  . Septra [Bactrim] Hives    Family History  Problem Relation Age of Onset  . Cancer Mother        tumor on tonsil  . Stroke Maternal Grandfather   . Diabetes Neg Hx     BP (!) 104/64   Pulse 66   Ht 5' 9.5" (1.765 m)   Wt (!) 229 lb 6.4 oz (104.1 kg)   SpO2 98%   BMI 33.39 kg/m    Review of Systems     Objective:   Physical Exam VITAL SIGNS:  See vs page GENERAL: no distress Pulses: dorsalis pedis intact bilat.   MSK: no deformity of the feet CV: 1+ bilat leg edema Skin:  no ulcer on the feet.  normal color and temp on the feet, but there is spotty hyperpigmentation of the legs.   Neuro: sensation is intact to touch on the feet.    Ext: there is bilateral onychomycosis of the toenails.     Lab Results  Component Value Date   CREATININE 0.90 12/03/2019   BUN 17 12/03/2019   NA 140 12/03/2019    K 4.3 12/03/2019   CL 104 12/03/2019   CO2 29 12/03/2019   Lab Results  Component Value Date   HGBA1C 8.1 (A) 12/24/2019       Assessment & Plan:  Type 2 DM, with CAD: worse Edema: This limits rx options  Patient Instructions  I have sent a prescription to your pharmacy, to re-try the metformin.  Please continue the same other medications.  check your blood sugar once a day.  vary the time of day when you check, between before the 3 meals, and at bedtime.  also check if you have symptoms of your blood sugar being too high or too low.  please keep a record of the readings and bring it to your next appointment here (or you can bring the meter itself).  You can write it on any piece of paper.  please call us sooner if your blood sugar goes below 70, or if you have a lot of readings over 200.  Please come back for a follow-up appointment in 2 months.

## 2019-12-25 ENCOUNTER — Other Ambulatory Visit: Payer: Self-pay | Admitting: Endocrinology

## 2019-12-31 DIAGNOSIS — I776 Arteritis, unspecified: Secondary | ICD-10-CM | POA: Diagnosis not present

## 2019-12-31 DIAGNOSIS — R768 Other specified abnormal immunological findings in serum: Secondary | ICD-10-CM | POA: Diagnosis not present

## 2019-12-31 DIAGNOSIS — Z7984 Long term (current) use of oral hypoglycemic drugs: Secondary | ICD-10-CM | POA: Diagnosis not present

## 2019-12-31 DIAGNOSIS — Z79899 Other long term (current) drug therapy: Secondary | ICD-10-CM | POA: Diagnosis not present

## 2020-01-17 ENCOUNTER — Ambulatory Visit: Payer: Medicare HMO | Attending: Internal Medicine

## 2020-01-17 DIAGNOSIS — Z23 Encounter for immunization: Secondary | ICD-10-CM

## 2020-01-17 NOTE — Progress Notes (Signed)
   Covid-19 Vaccination Clinic  Name:  Ryan Morgan    MRN: [id number] DOB: May 22, 1941  01/17/2020  [name] was observed post Covid-19 immunization for 15 minutes without incident. He was provided with Vaccine Information Sheet and instruction to access the V-Safe system.   [name] was instructed to call 911 with any severe reactions post vaccine: Marland Kitchen Difficulty breathing  . Swelling of face and throat  . A fast heartbeat  . A bad rash all over body  . Dizziness and weakness

## 2020-01-30 DIAGNOSIS — R69 Illness, unspecified: Secondary | ICD-10-CM | POA: Diagnosis not present

## 2020-02-21 ENCOUNTER — Ambulatory Visit (INDEPENDENT_AMBULATORY_CARE_PROVIDER_SITE_OTHER): Payer: Medicare HMO | Admitting: Endocrinology

## 2020-02-21 ENCOUNTER — Other Ambulatory Visit: Payer: Self-pay

## 2020-02-21 ENCOUNTER — Encounter: Payer: Self-pay | Admitting: Endocrinology

## 2020-02-21 VITALS — HR 66 | Ht 69.5 in | Wt 235.0 lb

## 2020-02-21 DIAGNOSIS — E11311 Type 2 diabetes mellitus with unspecified diabetic retinopathy with macular edema: Secondary | ICD-10-CM

## 2020-02-21 LAB — POCT GLYCOSYLATED HEMOGLOBIN (HGB A1C): Hemoglobin A1C: 6.9 % — AB (ref 4.0–5.6)

## 2020-02-21 NOTE — Progress Notes (Signed)
Subjective:    Patient ID: Ryan Morgan, male    DOB: Mar 30, 1941, 79 y.o.   MRN: [id number]  HPI Pt returns for f/u of diabetes mellitus:  DM type: 2 Dx'ed: 2841 Complications: PN, DR, and CAD.   Therapy: 3 oral meds DKA: never Severe hypoglycemia: never.   Pancreatitis: never Pancreatic imaging: never SDOH: he cannot afford brand name meds.  Other: he takes intermittent prednisone for Wegener's granulomatosis.   Interval history: no recent steroids.  He says cbg varies from 99-230.  It is in general higher as the day goes on. He takes meds as rx'ed.   Past Medical History:  Diagnosis Date   Coronary artery disease    a. prior LAD stenting 2000; b. [place] 2003 LAD 40, D1 50; c. Lexiscan 02/2017: large defect of severe severity in the basal inferoseptal, basal inferior, mid inferoseptal, mid inferior and apical inferior location, high risk; d. LHC 03/29/17: LM nl, patent LAD stent w/ 40% ISR, D1 100,  OM1 90, RPDA 100 w/ L-R collats, post atrio 80% s/p PCI/DES     Diabetes mellitus    Hyperlipidemia    Hypertension    Ischemic cardiomyopathy    a. TTE 02/2017: EF 30-35%, diffuse HK, normal LV diastolic function parameters, mild AI/MR, RV systolic function normal, PASP normal   Lung nodule    a. s/p prior thoracotomy   Morbid obesity ([place])    Wegener's granulomatosis ([place]) 2007    Past Surgical History:  Procedure Laterality Date   BASAL CELL CARCINOMA EXCISION     removed from face x 3   [place]   stent in heart   CORONARY ANGIOPLASTY  09/22/1998   s/p stent placement; Tristar 3.0 x 18 mm Ref [id number]   CORONARY STENT INTERVENTION N/A 03/29/2017   Procedure: CORONARY STENT INTERVENTION;  Surgeon: Wellington Hampshire, MD;  Location: [facility];  Service: Cardiovascular;  Laterality: N/A;   LEFT HEART CATH AND CORONARY ANGIOGRAPHY N/A 03/29/2017   Procedure: LEFT HEART CATH AND CORONARY ANGIOGRAPHY;  Surgeon: Wellington Hampshire, MD;  Location: [facility];  Service: Cardiovascular;  Laterality: N/A;   LUNG SURGERY     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   ULTRASOUND GUIDANCE FOR VASCULAR ACCESS  03/29/2017   Procedure: Ultrasound Guidance For Vascular Access;  Surgeon: Wellington Hampshire, MD;  Location: [facility];  Service: Cardiovascular;;    Social History   Socioeconomic History   Marital status: Married    Spouse name: Not on file   Number of children: Not on file   Years of education: Not on file   Highest education level: Not on file  Occupational History   Not on file  Tobacco Use   Smoking status: Former Smoker    Packs/day: 0.50    Years: 20.00    Pack years: 10.00    Types: Cigarettes    Quit date: 05/30/1978    Years since quitting: 41.7   Smokeless tobacco: Never Used  Vaping Use   Vaping Use: Never used  Substance and Sexual Activity   Alcohol use: No    Alcohol/week: 0.0 standard drinks   Drug use: No   Sexual activity: Not Currently  Other Topics Concern   Not on file  Social History Narrative   Regular exercise: yes   Caffeine use: very little   Social Determinants of Health   Financial Resource Strain: Low Risk  Difficulty of Paying Living Expenses: Not hard at all  Food Insecurity: No Food Insecurity   Worried About [place] in the Last Year: Never true   Ran Out of Food in the Last Year: Never true  Transportation Needs: No Transportation Needs   Lack of Transportation (Medical): No   Lack of Transportation (Non-Medical): No  Physical Activity: Inactive   Days of Exercise per Week: 0 days   Minutes of Exercise per Session: 0 min  Stress: No Stress Concern Present   Feeling of Stress : Not at all  Social Connections:    Frequency of Communication with Friends and Family: Not on file   Frequency of Social Gatherings with Friends and Family: Not on file   Attends Religious Services: Not on Advertising copywriter or Organizations: Not on file   Attends Archivist Meetings: Not on file   Marital Status: Not on file  Intimate Partner Violence: Not At Risk   Fear of Current or Ex-Partner: No   Emotionally Abused: No   Physically Abused: No   Sexually Abused: No    Current Outpatient Medications on File Prior to Visit  Medication Sig Dispense Refill   aspirin EC 81 MG tablet Take 81 mg by mouth daily.     atorvastatin (LIPITOR) 20 MG tablet Take 1 tablet (20 mg total) by mouth daily. 90 tablet 3   carvedilol (COREG) 6.25 MG tablet Take 1 tablet (6.25 mg total) by mouth 2 (two) times daily with a meal. 180 tablet 3   cetirizine (ZYRTEC) 10 MG tablet Take 10 mg by mouth 2 (two) times daily.      cholecalciferol (VITAMIN D3) 25 MCG (1000 UNIT) tablet Take 3,000 Units by mouth daily.     clobetasol cream (TEMOVATE) 8.29 % Apply 1 application topically 2 (two) times daily. To affected areas (Patient taking differently: Apply 1 application topically 2 (two) times daily as needed (itching/rash). ) 30 g 1   clopidogrel (PLAVIX) 75 MG tablet Take 1 tablet by mouth once daily 90 tablet 3   ENTRESTO 49-51 MG Take 1 tablet by mouth twice daily 180 tablet 2   ezetimibe (ZETIA) 10 MG tablet Take 1 tablet by mouth once daily 90 tablet 2   glucose blood (ONETOUCH ULTRA) test strip 1 each by Other route daily. E11.319 100 each 2   LANCETS ULTRA THIN 30G MISC by Does not apply route. One Touch     metFORMIN (GLUCOPHAGE-XR) 500 MG 24 hr tablet Take 1 tablet (500 mg total) by mouth daily with breakfast. 90 tablet 3   Multiple Vitamin (MULTIVITAMIN WITH MINERALS) TABS tablet Take 1 tablet by mouth daily.     Multiple Vitamins-Minerals (AIRBORNE) TBEF Take 1 tablet by mouth daily as needed (for immune system support). Immune Health/Support     nystatin cream (MYCOSTATIN) Apply 1 application topically 2 (two) times daily as needed for dry skin.     repaglinide (PRANDIN)  2 MG tablet TAKE 1 TABLET BY MOUTH THREE TIMES DAILY BEFORE MEAL(S) 270 tablet 0   spironolactone (ALDACTONE) 25 MG tablet Take 1 tablet by mouth once daily 90 tablet 2   tetrahydrozoline 0.05 % ophthalmic solution Place 1-2 drops into both eyes 3 (three) times daily as needed (for dry/irritated eyes).     Triamcinolone Acetonide (NASACORT ALLERGY 24HR NA) Place 2 sprays into the nose at bedtime as needed (allergies).      acarbose (PRECOSE) 25 MG tablet Take 1  tablet (25 mg total) by mouth 3 (three) times daily with meals. (Patient not taking: Reported on 12/24/2019) 270 tablet 3   [DISCONTINUED] nebivolol (BYSTOLIC) 5 MG tablet Take 1 tablet (5 mg total) by mouth 2 (two) times daily. 180 tablet 3   No current facility-administered medications on file prior to visit.    Allergies  Allergen Reactions   Metformin And Related Itching   Benicar Hct [Olmesartan Medoxomil-Hctz] Itching    Painful and itchy knots on palms of hands   Bystolic [Nebivolol Hcl] Other (See Comments)    headache   Dapsone Hives   Lovaza [Omega-3-Acid Ethyl Esters] Other (See Comments)    Unknown reaction   Niaspan [Niacin Er] Other (See Comments)    flushing   Peanut-Containing Drug Products     Rash on hands from peanuts   Septra [Bactrim] Hives    Family History  Problem Relation Age of Onset   Cancer Mother        tumor on tonsil   Stroke Maternal Grandfather    Diabetes Neg Hx     Pulse 66    Ht 5' 9.5" (1.765 m)    Wt 235 lb (106.6 kg)    SpO2 97%    BMI 34.21 kg/m    Review of Systems Denies rash.      Objective:   Physical Exam VITAL SIGNS:  See vs page GENERAL: no distress Pulses: dorsalis pedis intact bilat.   MSK: no deformity of the feet CV: 1+ bilat leg edema Skin:  no ulcer on the feet.  normal color and temp on the feet.  There is spotty hyperpigmentation on the legs and feet.   Neuro: sensation is intact to touch on the feet Ext: there is bilateral onychomycosis of  the toenails.    Lab Results  Component Value Date   HGBA1C 6.9 (A) 02/21/2020   Lab Results  Component Value Date   CREATININE 0.90 12/03/2019   BUN 17 12/03/2019   NA 140 12/03/2019   K 4.3 12/03/2019   CL 104 12/03/2019   CO2 29 12/03/2019       Assessment & Plan:  Type 2 DM: well-controlled.  Please continue the same acarbose/repaglinide/metformin  Patient Instructions  Please continue the same diabetes medications check your blood sugar once a day.  vary the time of day when you check, between before the 3 meals, and at bedtime.  also check if you have symptoms of your blood sugar being too high or too low.  please keep a record of the readings and bring it to your next appointment here (or you can bring the meter itself).  You can write it on any piece of paper.  please call us sooner if your blood sugar goes below 70, or if you have a lot of readings over 200.  Please come back for a follow-up appointment in 4 months.

## 2020-02-21 NOTE — Patient Instructions (Signed)
Please continue the same diabetes medications.   check your blood sugar once a day.  vary the time of day when you check, between before the 3 meals, and at bedtime.  also check if you have symptoms of your blood sugar being too high or too low.  please keep a record of the readings and bring it to your next appointment here (or you can bring the meter itself).  You can write it on any piece of paper.  please call us sooner if your blood sugar goes below 70, or if you have a lot of readings over 200.   Please come back for a follow-up appointment in 4 months.   

## 2020-02-26 ENCOUNTER — Other Ambulatory Visit: Payer: Self-pay | Admitting: Cardiovascular Disease

## 2020-03-12 DIAGNOSIS — R69 Illness, unspecified: Secondary | ICD-10-CM | POA: Diagnosis not present

## 2020-03-26 ENCOUNTER — Other Ambulatory Visit: Payer: Self-pay | Admitting: Endocrinology

## 2020-03-27 DIAGNOSIS — E119 Type 2 diabetes mellitus without complications: Secondary | ICD-10-CM | POA: Diagnosis not present

## 2020-03-27 LAB — HM DIABETES EYE EXAM

## 2020-04-13 ENCOUNTER — Ambulatory Visit: Payer: Medicare HMO

## 2020-04-13 DIAGNOSIS — E7800 Pure hypercholesterolemia, unspecified: Secondary | ICD-10-CM

## 2020-04-13 DIAGNOSIS — E11311 Type 2 diabetes mellitus with unspecified diabetic retinopathy with macular edema: Secondary | ICD-10-CM

## 2020-04-13 NOTE — Chronic Care Management (AMB) (Signed)
Chronic Care Management Pharmacy  Name: Ryan Morgan  MRN: [id number] DOB: 1940-11-28   Chief Complaint/ HPI  Ryan Morgan,  79 y.o. , male presents for their Follow-Up CCM visit with the clinical pharmacist via telephone.  PCP : Ryan Greenspan, MD  Their chronic conditions include: hypertension, hyperlipidemia, heart failure, diabetes, CAD, osteopenia  Patient concerns: denies medication concerns  Office Visits:  12/17/19: [facility] - continue current medications  12/04/18: [facility] - take 2000 IU vitamin D daily, watch carbs  Consult Visit:  02/21/20: Endocrinology - continue current medications  08/19/19: Laceration of thumb - doing well  08/13/19:Cardiology - continue current medications   07/17/19: Endocrinology - add acarbose, continue repaglinide   Allergies  Allergen Reactions  . Metformin And Related Itching  . Benicar Hct [Olmesartan Medoxomil-Hctz] Itching    Painful and itchy knots on palms of hands  . Bystolic [Nebivolol Hcl] Other (See Comments)    headache  . Dapsone Hives  . Lovaza [Omega-3-Acid Ethyl Esters] Other (See Comments)    Unknown reaction  . Niaspan [Niacin Er] Other (See Comments)    flushing  . Peanut-Containing Drug Products     Rash on hands from peanuts  . Septra [Bactrim] Hives   Medications: Outpatient Encounter Medications as of 04/13/2020  Medication Sig Note  . acarbose (PRECOSE) 25 MG tablet Take 1 tablet (25 mg total) by mouth 3 (three) times daily with meals. (Patient not taking: Reported on 12/24/2019)   . aspirin EC 81 MG tablet Take 81 mg by mouth daily.   Marland Kitchen atorvastatin (LIPITOR) 20 MG tablet Take 1 tablet (20 mg total) by mouth daily.   . carvedilol (COREG) 6.25 MG tablet Take 1 tablet (6.25 mg total) by mouth 2 (two) times daily with a meal.   . cetirizine (ZYRTEC) 10 MG tablet Take 10 mg by mouth 2 (two) times daily.    . cholecalciferol (VITAMIN D3) 25 MCG (1000 UNIT) tablet Take 3,000 Units by mouth daily.   . clobetasol  cream (TEMOVATE) 7.78 % Apply 1 application topically 2 (two) times daily. To affected areas (Patient taking differently: Apply 1 application topically 2 (two) times daily as needed (itching/rash). )   . clopidogrel (PLAVIX) 75 MG tablet Take 1 tablet by mouth once daily   . ENTRESTO 49-51 MG Take 1 tablet by mouth twice daily   . ezetimibe (ZETIA) 10 MG tablet Take 1 tablet by mouth once daily   . glucose blood (ONETOUCH ULTRA) test strip 1 each by Other route daily. E11.319   . LANCETS ULTRA THIN 30G MISC by Does not apply route. One Touch   . metFORMIN (GLUCOPHAGE-XR) 500 MG 24 hr tablet Take 1 tablet (500 mg total) by mouth daily with breakfast.   . Multiple Vitamin (MULTIVITAMIN WITH MINERALS) TABS tablet Take 1 tablet by mouth daily.   . Multiple Vitamins-Minerals (AIRBORNE) TBEF Take 1 tablet by mouth daily as needed (for immune system support). Immune Health/Support   . nystatin cream (MYCOSTATIN) Apply 1 application topically 2 (two) times daily as needed for dry skin.   . repaglinide (PRANDIN) 2 MG tablet TAKE 1 TABLET BY MOUTH THREE TIMES DAILY BEFORE MEAL(S)   . spironolactone (ALDACTONE) 25 MG tablet Take 1 tablet by mouth once daily   . tetrahydrozoline 0.05 % ophthalmic solution Place 1-2 drops into both eyes 3 (three) times daily as needed (for dry/irritated eyes).   . Triamcinolone Acetonide (NASACORT ALLERGY 24HR NA) Place 2 sprays into the nose at bedtime  as needed (allergies).    . [DISCONTINUED] nebivolol (BYSTOLIC) 5 MG tablet Take 1 tablet (5 mg total) by mouth 2 (two) times daily. 08/16/2011: headache   No facility-administered encounter medications on file as of 04/13/2020.   Current Diagnosis/Assessment: Goals    . Increase physical activity     Starting 11/23/2018, I will continue to exercise and do outdoor activities for 3-4 hours 5 days per week.     . Patient Stated     12/03/2019, I will maintain and continue medications as prescribed.     . Pharmacy Care Plan      CARE PLAN ENTRY (see longitudinal plan of care for additional care plan information)  Current Barriers:  . Chronic Disease Management support, education, and care coordination needs related to Hyperlipidemia and Diabetes   Hyperlipidemia Lab Results  Component Value Date/Time   LDLCALC 42 12/03/2019 09:39 AM   LDLDIRECT 100.0 05/17/2017 01:49 PM   . Pharmacist Clinical Goal(s): o Over the next 6 months, patient will work with PharmD and providers to maintain LDL goal < 70 . Current regimen:   Atorvastatin 20 mg - 1 tablet daily  Ezetimibe 10 mg - 1 tablet daily . Interventions: o Reviewed adherence and tolerability . Patient self care activities - Over the next 6 months, patient will: . Slowly increase exercise with goal of 30 minutes, 5 days per week . Incorporate a healthy diet high in vegetables, fruits and whole grains with low-fat dairy products, chicken, fish, legumes, non-tropical vegetable oils and nuts. Limit intake of sweets, sugar-sweetened beverages and red meats.  Diabetes Lab Results  Component Value Date/Time   HGBA1C 6.9 (A) 02/21/2020 09:13 AM   HGBA1C 8.1 (A) 12/24/2019 09:28 AM   HGBA1C 8.4 (H) 11/26/2018 11:27 AM   HGBA1C 7.6 (H) 08/24/2018 09:25 PM   . Pharmacist Clinical Goal(s): o Over the next 6 months, patient will work with PharmD and providers to maintain A1c goal <7% . Current regimen:   Repaglinide 2 mg - 1 tablet three times daily before meals  Acarbose 25 mg - 1/2 tablet three times daily with meals  Metformin 500 mg ER - 1 tablet daily . Interventions: o Reviewed medication adherence, tolerance and BG log . Patient self care activities - Over the next 6 months, patient will: o Check blood sugar once daily, document, and provide at future appointments o Contact provider with any episodes of hypoglycemia  Please see past updates related to this goal by clicking on the "Past Updates" button in the selected goal        Hypertension    CMP Latest Ref Rng & Units 12/03/2019 09/12/2019 11/26/2018  Glucose 70 - 99 mg/dL 171(H) 188(H) 156(H)  BUN 6 - 23 mg/dL 17 7(L) 9  Creatinine 0.40 - 1.50 mg/dL 0.90 0.95 0.84  Sodium 135 - 145 mEq/L 140 140 141  Potassium 3.5 - 5.1 mEq/L 4.3 4.5 4.1  Chloride 96 - 112 mEq/L 104 103 104  CO2 19 - 32 mEq/L 29 27 29   Calcium 8.4 - 10.5 mg/dL 8.7 9.0 8.5  Total Protein 6.0 - 8.3 g/dL 5.8(L) - 5.9(L)  Total Bilirubin 0.2 - 1.2 mg/dL 0.6 - 0.5  Alkaline Phos 39 - 117 U/L 62 - 71  AST 0 - 37 U/L 13 - 12  ALT 0 - 53 U/L 12 - 13   Office blood pressures are: BP Readings from Last 3 Encounters:  12/24/19 (!) 104/64  12/17/19 122/76  10/30/19 110/70   Goal <  140/90 mmHg Patient has failed these meds in the past: none reported Patient checks BP at home: occasionally  Patient home BP readings are ranging: 137/76 mmHg - little variation   Patient is currently controlled on the following medications:   Spironolactone 25 mg - 1 tablet daily  Carvedilol 6.25 mg - 1 tablet BID  We discussed: watches salt intake, avoids table salt Exercise: walking, yard work, pruning - weekly  Update 04/13/20: Refills timely, checking BP occasionally, remains around 135/75  Plan: Continue current medications  Hyperlipidemia/CAD   CBC Latest Ref Rng & Units 12/03/2019 09/12/2019 11/26/2018  WBC 4.0 - 10.5 K/uL 3.5(L) 4.6 3.5(L)  Hemoglobin 13.0 - 17.0 g/dL 12.3(L) 12.4(L) 13.1  Hematocrit 39 - 52 % 35.2(L) 36.5(L) 38.3(L)  Platelets 150 - 400 K/uL 151.0 159 175.0   Lipid Panel     Component Value Date/Time   CHOL 114 12/03/2019 0939   TRIG 175.0 (H) 12/03/2019 0939   HDL 37.20 (L) 12/03/2019 0939   CHOLHDL 3 12/03/2019 0939   VLDL 35.0 12/03/2019 0939   LDLCALC 42 12/03/2019 0939   LDLDIRECT 100.0 05/17/2017 1349    LDL goal < 70 (CAD) Patient has failed these meds in past: Lovaza, Niaspan  Patient is currently controlled on the following medications:   Atorvastatin 20 mg - 1 tablet  daily  Ezetimibe 10 mg - 1 tablet daily  Aspirin 81 mg - 1 tablet daily  Clopidogrel 75 mg - 1 tablet daily  We discussed: adherence, concerns  Stent placement (2000, 2003, 2018) - confirmed still on plavix and aspirin  Update 04/13/20: denies abnormal bruising or bleeding, refills timely   Plan: Continue current medications  Diabetes   Followed by endocrinology Recent Relevant Labs: Lab Results  Component Value Date/Time   HGBA1C 6.9 (A) 02/21/2020 09:13 AM   HGBA1C 8.1 (A) 12/24/2019 09:28 AM   HGBA1C 8.4 (H) 11/26/2018 11:27 AM   HGBA1C 7.6 (H) 08/24/2018 09:25 PM   MICROALBUR 1.8 11/26/2018 11:27 AM   MICROALBUR <0.7 11/20/2017 11:26 AM    Goal A1c < 7% Patient has failed these meds in past: metformin (suspected hives - later attributed to nuts) Patient is currently controlled on the following medications:   Repaglinide 2 mg - 1 tablet TID before meals  Acarbose 25 mg - 1/2 tablet TID with meals  Metformin 500 mg ER - 1 tablet daily (NEW 11/2019)  Last diabetic eye exam:  Lab Results  Component Value Date/Time   HMDIABEYEEXA No Retinopathy 01/18/2019 12:00 AM    Last diabetic foot exam: last endocrinology visit 2021, reports no abnormalities   Update 04/13/20: Reports he started back on metformin 12/24/19 and doing well. Checking BG daily at varying times - lowest in past 30 days 81, 97, 97, 85, 92, highest: 218 (after meal) Average for month of November so far 134, Average for October 120  Reports high in May and June but was on prednisone  Exercise - has not ridden his bike in a while, satisfied with current exercise, just staying busy   Plan: Continue current medications; Continue follow up with specialist. Encouraged exercise 30 minutes 5 days a week and low carbohydrate diet.   Heart Failure   Type: Systolic  Last ejection fraction: April 2019 30-35% NYHA Class: I (no actitivty limitation) AHA HF Stage: C (Heart disease and symptoms  present)  Patient has failed these meds in past: none Patient is currently controlled on the following medications:   Spironolactone 25 mg - 1 tablet  daily  Entresto 49-51 mg - 1 tablet BID  Carvedilol 6.25 mg - 1 tablet BID  We discussed weighing daily; if you gain more than 3 pounds in one day or 5 pounds in one week call your doctor  Update 04/13/20: denies symptoms of SOB or swelling   Plan: Continue current medications   Osteopenia   Vitamin D (06/02/2014) 28 (low) Patient has failed these meds in past: none Patient is currently controlled on the following medications:   Vitamin D 2000 IU - daily  We discussed: confirms adherence  04/13/20: no changes  Plan: Continue current medications  Vaccines   Reviewed and discussed patient's vaccination history.    Immunization History  Administered Date(s) Administered  . Fluad Quad(high Dose 65+) 03/13/2019  . Influenza Split 05/16/2011, 02/16/2012  . Influenza, High Dose Seasonal PF 02/28/2017  . Influenza,inj,Quad PF,6+ Mos 03/05/2013, 03/10/2014, 05/20/2015, 03/30/2016, 05/10/2018  . PFIZER SARS-COV-2 Vaccination 07/12/2019, 08/02/2019, 01/17/2020  . Pneumococcal Conjugate-13 06/09/2014  . Pneumococcal Polysaccharide-23 07/15/2011  . Td 11/29/2017   Plan: Recommend patient receive Shingrix  Medication Management  Misc: clobetasol cream 0.05% BID rash, nystatin cream  OTCs: cetirizine 10 mg BID, multivitamin, airborne, tetrazhydrozolone 0.05% drops, Nasacort spray  Pharmacy/Benefits: [employer]  Adherence: pillbox, divided into morning and evening  Affordability: cost - Entresto, but able to afford   CCM Follow Up:  6 months, telephone  Debbora Dus, PharmD Clinical Pharmacist [facility] at [facility] [phone]

## 2020-04-13 NOTE — Patient Instructions (Addendum)
Dear Ryan Morgan,  Below is a summary of the goals we discussed during our follow up appointment on April 13, 2020. Please contact me anytime with questions or concerns.   Visit Information  Goals Addressed            This Visit's Progress   . Pharmacy Care Plan       CARE PLAN ENTRY (see longitudinal plan of care for additional care plan information)  Current Barriers:  . Chronic Disease Management support, education, and care coordination needs related to Hyperlipidemia and Diabetes   Hyperlipidemia Lab Results  Component Value Date/Time   LDLCALC 42 12/03/2019 09:39 AM   LDLDIRECT 100.0 05/17/2017 01:49 PM   . Pharmacist Clinical Goal(s): o Over the next 6 months, patient will work with PharmD and providers to maintain LDL goal < 70 . Current regimen:   Atorvastatin 20 mg - 1 tablet daily  Ezetimibe 10 mg - 1 tablet daily . Interventions: o Reviewed adherence and tolerability . Patient self care activities - Over the next 6 months, patient will: . Slowly increase exercise with goal of 30 minutes, 5 days per week . Incorporate a healthy diet high in vegetables, fruits and whole grains with low-fat dairy products, chicken, fish, legumes, non-tropical vegetable oils and nuts. Limit intake of sweets, sugar-sweetened beverages and red meats.  Diabetes Lab Results  Component Value Date/Time   HGBA1C 6.9 (A) 02/21/2020 09:13 AM   HGBA1C 8.1 (A) 12/24/2019 09:28 AM   HGBA1C 8.4 (H) 11/26/2018 11:27 AM   HGBA1C 7.6 (H) 08/24/2018 09:25 PM   . Pharmacist Clinical Goal(s): o Over the next 6 months, patient will work with PharmD and providers to maintain A1c goal <7% . Current regimen:   Repaglinide 2 mg - 1 tablet three times daily before meals  Acarbose 25 mg - 1/2 tablet three times daily with meals  Metformin 500 mg ER - 1 tablet daily . Interventions: o Reviewed medication adherence, tolerance and BG log . Patient self care activities - Over the next 6  months, patient will: o Check blood sugar once daily, document, and provide at future appointments o Contact provider with any episodes of hypoglycemia  Please see past updates related to this goal by clicking on the "Past Updates" button in the selected goal        The patient verbalized understanding of instructions, educational materials, and care plan provided today and agreed to receive a mailed copy of patient instructions, educational materials, and care plan.   Telephone follow up appointment with pharmacy team member scheduled for: 10/12/2020 at 11 AM (telephone)  Debbora Dus, PharmD Clinical Pharmacist [facility] at [facility] [phone]   [place] stands for "Dietary Approaches to Stop Hypertension." The DASH eating plan is a healthy eating plan that has been shown to reduce high blood pressure (hypertension). It may also reduce your risk for type 2 diabetes, heart disease, and stroke. The DASH eating plan may also help with weight loss. What are tips for following this plan?  General guidelines  Avoid eating more than 2,300 mg (milligrams) of salt (sodium) a day. If you have hypertension, you may need to reduce your sodium intake to 1,500 mg a day.  Limit alcohol intake to no more than 1 drink a day for nonpregnant women and 2 drinks a day for men. One drink equals 12 oz of beer, 5 oz of wine, or 1 oz of hard liquor.  Work with your health  care provider to maintain a healthy body weight or to lose weight. Ask what an ideal weight is for you.  Get at least 30 minutes of exercise that causes your heart to beat faster (aerobic exercise) most days of the week. Activities may include walking, swimming, or biking.  Work with your health care provider or diet and nutrition specialist (dietitian) to adjust your eating plan to your individual calorie needs. Reading food labels   Check food labels for the amount of sodium per serving. Choose foods  with less than 5 percent of the Daily Value of sodium. Generally, foods with less than 300 mg of sodium per serving fit into this eating plan.  To find whole grains, look for the word "whole" as the first word in the ingredient list. Shopping  Buy products labeled as "low-sodium" or "no salt added."  Buy fresh foods. Avoid canned foods and premade or frozen meals. Cooking  Avoid adding salt when cooking. Use salt-free seasonings or herbs instead of table salt or sea salt. Check with your health care provider or pharmacist before using salt substitutes.  Do not fry foods. Cook foods using healthy methods such as baking, boiling, grilling, and broiling instead.  Cook with heart-healthy oils, such as olive, canola, soybean, or sunflower oil. Meal planning  Eat a balanced diet that includes: ? 5 or more servings of fruits and vegetables each day. At each meal, try to fill half of your plate with fruits and vegetables. ? Up to 6-8 servings of whole grains each day. ? Less than 6 oz of lean meat, poultry, or fish each day. A 3-oz serving of meat is about the same size as a deck of cards. One egg equals 1 oz. ? 2 servings of low-fat dairy each day. ? A serving of nuts, seeds, or beans 5 times each week. ? Heart-healthy fats. Healthy fats called Omega-3 fatty acids are found in foods such as flaxseeds and coldwater fish, like sardines, salmon, and mackerel.  Limit how much you eat of the following: ? Canned or prepackaged foods. ? Food that is high in trans fat, such as fried foods. ? Food that is high in saturated fat, such as fatty meat. ? Sweets, desserts, sugary drinks, and other foods with added sugar. ? Full-fat dairy products.  Do not salt foods before eating.  Try to eat at least 2 vegetarian meals each week.  Eat more home-cooked food and less restaurant, buffet, and fast food.  When eating at a restaurant, ask that your food be prepared with less salt or no salt, if  possible. What foods are recommended? The items listed may not be a complete list. Talk with your dietitian about what dietary choices are best for you. Grains Whole-grain or whole-wheat bread. Whole-grain or whole-wheat pasta. Brown rice. [place]. Bulgur. Whole-grain and low-sodium cereals. Pita bread. Low-fat, low-sodium crackers. Whole-wheat flour tortillas. Vegetables Fresh or frozen vegetables (raw, steamed, roasted, or grilled). Low-sodium or reduced-sodium tomato and vegetable juice. Low-sodium or reduced-sodium tomato sauce and tomato paste. Low-sodium or reduced-sodium canned vegetables. Fruits All fresh, dried, or frozen fruit. Canned fruit in natural juice (without added sugar). Meat and other protein foods Skinless chicken or [place]. Ground chicken or [place]. Pork with fat trimmed off. Fish and seafood. Egg whites. Dried beans, peas, or lentils. Unsalted nuts, nut butters, and seeds. Unsalted canned beans. Lean cuts of beef with fat trimmed off. Low-sodium, lean deli meat. Dairy Low-fat (1%) or fat-free (skim) milk. Fat-free, low-fat, or  reduced-fat cheeses. Nonfat, low-sodium ricotta or cottage cheese. Low-fat or nonfat yogurt. Low-fat, low-sodium cheese. Fats and oils Soft margarine without trans fats. Vegetable oil. Low-fat, reduced-fat, or light mayonnaise and salad dressings (reduced-sodium). Canola, safflower, olive, soybean, and sunflower oils. Avocado. Seasoning and other foods Herbs. Spices. Seasoning mixes without salt. Unsalted popcorn and pretzels. Fat-free sweets. What foods are not recommended? The items listed may not be a complete list. Talk with your dietitian about what dietary choices are best for you. Grains Baked goods made with fat, such as croissants, muffins, or some breads. Dry pasta or rice meal packs. Vegetables Creamed or fried vegetables. Vegetables in a cheese sauce. Regular canned vegetables (not low-sodium or reduced-sodium). Regular canned  tomato sauce and paste (not low-sodium or reduced-sodium). Regular tomato and vegetable juice (not low-sodium or reduced-sodium). Angie Fava. Olives. Fruits Canned fruit in a light or heavy syrup. Fried fruit. Fruit in cream or butter sauce. Meat and other protein foods Fatty cuts of meat. Ribs. Fried meat. Berniece Salines. Sausage. Bologna and other processed lunch meats. Salami. Fatback. Hotdogs. Bratwurst. Salted nuts and seeds. Canned beans with added salt. Canned or smoked fish. Whole eggs or egg yolks. Chicken or [place] with skin. Dairy Whole or 2% milk, cream, and half-and-half. Whole or full-fat cream cheese. Whole-fat or sweetened yogurt. Full-fat cheese. Nondairy creamers. Whipped toppings. Processed cheese and cheese spreads. Fats and oils Butter. Stick margarine. Lard. Shortening. Ghee. Bacon fat. Tropical oils, such as coconut, palm kernel, or palm oil. Seasoning and other foods Salted popcorn and pretzels. Onion salt, garlic salt, seasoned salt, table salt, and sea salt. Worcestershire sauce. Tartar sauce. Barbecue sauce. Teriyaki sauce. Soy sauce, including reduced-sodium. Steak sauce. Canned and packaged gravies. Fish sauce. Oyster sauce. Cocktail sauce. Horseradish that you find on the shelf. Ketchup. Mustard. Meat flavorings and tenderizers. Bouillon cubes. Hot sauce and Tabasco sauce. Premade or packaged marinades. Premade or packaged taco seasonings. Relishes. Regular salad dressings. Where to find more information:  National Heart, Lung, and [place]: https://wilson-eaton.com/  American Heart Association: www.heart.org Summary  The DASH eating plan is a healthy eating plan that has been shown to reduce high blood pressure (hypertension). It may also reduce your risk for type 2 diabetes, heart disease, and stroke.  With the DASH eating plan, you should limit salt (sodium) intake to 2,300 mg a day. If you have hypertension, you may need to reduce your sodium intake to 1,500 mg a day.  When  on the DASH eating plan, aim to eat more fresh fruits and vegetables, whole grains, lean proteins, low-fat dairy, and heart-healthy fats.  Work with your health care provider or diet and nutrition specialist (dietitian) to adjust your eating plan to your individual calorie needs. This information is not intended to replace advice given to you by your health care provider. Make sure you discuss any questions you have with your health care provider. Document Revised: 04/28/2017 Document Reviewed: 05/09/2016 Elsevier Patient Education  2020 [employer].

## 2020-04-13 NOTE — Progress Notes (Signed)
I have collaborated with the care management provider regarding care management and care coordination activities outlined in this encounter and have reviewed this encounter including documentation in the note and care plan. I am certifying that I agree with the content of this note and encounter as supervising physician. Loura Pardon MD

## 2020-05-02 ENCOUNTER — Other Ambulatory Visit: Payer: Self-pay | Admitting: Endocrinology

## 2020-05-02 DIAGNOSIS — R69 Illness, unspecified: Secondary | ICD-10-CM | POA: Diagnosis not present

## 2020-05-02 DIAGNOSIS — E11311 Type 2 diabetes mellitus with unspecified diabetic retinopathy with macular edema: Secondary | ICD-10-CM

## 2020-05-05 ENCOUNTER — Encounter: Payer: Self-pay | Admitting: Family Medicine

## 2020-05-05 DIAGNOSIS — I776 Arteritis, unspecified: Secondary | ICD-10-CM | POA: Diagnosis not present

## 2020-05-15 ENCOUNTER — Other Ambulatory Visit: Payer: Self-pay | Admitting: Cardiovascular Disease

## 2020-06-10 DIAGNOSIS — X32XXXA Exposure to sunlight, initial encounter: Secondary | ICD-10-CM | POA: Diagnosis not present

## 2020-06-10 DIAGNOSIS — L508 Other urticaria: Secondary | ICD-10-CM | POA: Diagnosis not present

## 2020-06-10 DIAGNOSIS — L570 Actinic keratosis: Secondary | ICD-10-CM | POA: Diagnosis not present

## 2020-06-10 DIAGNOSIS — Z08 Encounter for follow-up examination after completed treatment for malignant neoplasm: Secondary | ICD-10-CM | POA: Diagnosis not present

## 2020-06-10 DIAGNOSIS — Z85828 Personal history of other malignant neoplasm of skin: Secondary | ICD-10-CM | POA: Diagnosis not present

## 2020-06-18 ENCOUNTER — Other Ambulatory Visit: Payer: Self-pay

## 2020-06-22 ENCOUNTER — Ambulatory Visit: Payer: Medicare HMO | Admitting: Endocrinology

## 2020-06-22 ENCOUNTER — Encounter: Payer: Self-pay | Admitting: Endocrinology

## 2020-06-22 ENCOUNTER — Other Ambulatory Visit: Payer: Self-pay | Admitting: Cardiovascular Disease

## 2020-06-22 ENCOUNTER — Other Ambulatory Visit: Payer: Self-pay

## 2020-06-22 ENCOUNTER — Other Ambulatory Visit: Payer: Self-pay | Admitting: Endocrinology

## 2020-06-22 VITALS — BP 142/84 | HR 70 | Ht 70.5 in | Wt 230.0 lb

## 2020-06-22 DIAGNOSIS — E11311 Type 2 diabetes mellitus with unspecified diabetic retinopathy with macular edema: Secondary | ICD-10-CM

## 2020-06-22 LAB — POCT GLYCOSYLATED HEMOGLOBIN (HGB A1C): Hemoglobin A1C: 7 % — AB (ref 4.0–5.6)

## 2020-06-22 MED ORDER — ONETOUCH ULTRA VI STRP: 1.0000 | ORAL_STRIP | Freq: Two times a day (BID) | 3 refills | Status: DC

## 2020-06-22 NOTE — Patient Instructions (Addendum)
Your blood pressure is high today.  Please see your primary care provider soon, to have it rechecked Please continue the same diabetes medications.  check your blood sugar once a day.  vary the time of day when you check, between before the 3 meals, and at bedtime.  also check if you have symptoms of your blood sugar being too high or too low.  please keep a record of the readings and bring it to your next appointment here (or you can bring the meter itself).  You can write it on any piece of paper.  please call us sooner if your blood sugar goes below 70, or if you have a lot of readings over 200. Please come back for a follow-up appointment in 5 months.   

## 2020-06-22 NOTE — Progress Notes (Signed)
Subjective:    Patient ID: Ryan Morgan, male    DOB: Sep 22, 1940, 80 y.o.   MRN: [id number]  HPI Pt returns for f/u of diabetes mellitus:   DM type: 2 Dx'ed: 6378 Complications: PN, DR, and CAD.   Therapy: 3 oral meds DKA: never Severe hypoglycemia: never.   Pancreatitis: never Pancreatic imaging: never SDOH: he cannot afford brand name meds.  Other: he takes intermittent prednisone for Wegener's granulomatosis.   Interval history: no recent steroids.  He says cbg varies from 89-178.  It is in general higher as the day goes on. He takes meds as rx'ed.  pt states he feels well in general. Past Medical History:  Diagnosis Date  . Coronary artery disease    a. prior LAD stenting 2000; b. [place] 2003 LAD 40, D1 50; c. Lexiscan 02/2017: large defect of severe severity in the basal inferoseptal, basal inferior, mid inferoseptal, mid inferior and apical inferior location, high risk; d. LHC 03/29/17: LM nl, patent LAD stent w/ 40% ISR, D1 100,  OM1 90, RPDA 100 w/ L-R collats, post atrio 80% s/p PCI/DES    . Diabetes mellitus   . Hyperlipidemia   . Hypertension   . Ischemic cardiomyopathy    a. TTE 02/2017: EF 30-35%, diffuse HK, normal LV diastolic function parameters, mild AI/MR, RV systolic function normal, PASP normal  . Lung nodule    a. s/p prior thoracotomy  . Morbid obesity ([place])   . Wegener's granulomatosis 2007    Past Surgical History:  Procedure Laterality Date  . BASAL CELL CARCINOMA EXCISION     removed from face x 3  . CARDIAC CATHETERIZATION    . CARDIAC SURGERY  2000   stent in heart  . CORONARY ANGIOPLASTY  09/22/1998   s/p stent placement; [facility] 3.0 x 18 mm Ref [id number]  . CORONARY STENT INTERVENTION N/A 03/29/2017   Procedure: CORONARY STENT INTERVENTION;  Surgeon: Wellington Hampshire, MD;  Location: [facility];  Service: Cardiovascular;  Laterality: N/A;  . LEFT HEART CATH AND CORONARY ANGIOGRAPHY N/A 03/29/2017   Procedure: LEFT HEART CATH AND CORONARY  ANGIOGRAPHY;  Surgeon: Wellington Hampshire, MD;  Location: [facility];  Service: Cardiovascular;  Laterality: N/A;  . 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 ULTRASOUND GUIDANCE FOR VASCULAR ACCESS  03/29/2017   Procedure: Ultrasound Guidance For Vascular Access;  Surgeon: Wellington Hampshire, MD;  Location: [facility];  Service: Cardiovascular;;    Social History   Socioeconomic History  . Marital status: Married    Spouse name: Not on file  . Number of children: Not on file  . Years of education: Not on file  . Highest education level: Not on file  Occupational History  . Not on file  Tobacco Use  . Smoking status: Former Smoker    Packs/day: 0.50    Years: 20.00    Pack years: 10.00    Types: Cigarettes    Quit date: 05/30/1978    Years since quitting: 42.0  . Smokeless tobacco: Never Used  Vaping Use  . Vaping Use: Never used  Substance and Sexual Activity  . Alcohol use: No    Alcohol/week: 0.0 standard drinks  . Drug use: No  . Sexual activity: Not Currently  Other Topics Concern  . Not on file  Social History Narrative   Regular exercise: yes   Caffeine use: very little   Social Determinants of Health  Financial Resource Strain: Low Risk   . Difficulty of Paying Living Expenses: Not hard at all  Food Insecurity: No Food Insecurity  . Worried About Charity fundraiser in the Last Year: Never true  . Ran Out of Food in the Last Year: Never true  Transportation Needs: No Transportation Needs  . Lack of Transportation (Medical): No  . Lack of Transportation (Non-Medical): No  Physical Activity: Inactive  . Days of Exercise per Week: 0 days  . Minutes of Exercise per Session: 0 min  Stress: No Stress Concern Present  . Feeling of Stress : Not at all  Social Connections: Not on file  Intimate Partner Violence: Not At Risk  . Fear of Current or Ex-Partner: No  . Emotionally Abused: No  . Physically Abused: No  . Sexually  Abused: No    Current Outpatient Medications on File Prior to Visit  Medication Sig Dispense Refill  . acarbose (PRECOSE) 25 MG tablet Take 1 tablet (25 mg total) by mouth 3 (three) times daily with meals. (Patient not taking: Reported on 12/24/2019) 270 tablet 3  . aspirin EC 81 MG tablet Take 81 mg by mouth daily.    Marland Kitchen atorvastatin (LIPITOR) 20 MG tablet Take 1 tablet (20 mg total) by mouth daily. 90 tablet 3  . carvedilol (COREG) 6.25 MG tablet Take 1 tablet (6.25 mg total) by mouth 2 (two) times daily with a meal. 180 tablet 3  . cetirizine (ZYRTEC) 10 MG tablet Take 10 mg by mouth 2 (two) times daily.     . cholecalciferol (VITAMIN D3) 25 MCG (1000 UNIT) tablet Take 3,000 Units by mouth daily.    . clobetasol cream (TEMOVATE) [id number] % Apply 1 application topically 2 (two) times daily. To affected areas (Patient taking differently: Apply 1 application topically 2 (two) times daily as needed (itching/rash). ) 30 g 1  . clopidogrel (PLAVIX) 75 MG tablet Take 1 tablet by mouth once daily 90 tablet 2  . LANCETS ULTRA THIN 30G MISC by Does not apply route. One Touch    . metFORMIN (GLUCOPHAGE-XR) 500 MG 24 hr tablet Take 1 tablet (500 mg total) by mouth daily with breakfast. 90 tablet 3  . Multiple Vitamin (MULTIVITAMIN WITH MINERALS) TABS tablet Take 1 tablet by mouth daily.    . Multiple Vitamins-Minerals (AIRBORNE) TBEF Take 1 tablet by mouth daily as needed (for immune system support). Immune Health/Support    . nystatin cream (MYCOSTATIN) Apply 1 application topically 2 (two) times daily as needed for dry skin.    Marland Kitchen sacubitril-valsartan (ENTRESTO) 49-51 MG Take 1 tablet by mouth 2 (two) times daily. 60 tablet 2  . tetrahydrozoline 0.05 % ophthalmic solution Place 1-2 drops into both eyes 3 (three) times daily as needed (for dry/irritated eyes).    . Triamcinolone Acetonide (NASACORT ALLERGY 24HR NA) Place 2 sprays into the nose at bedtime as needed (allergies).     . [DISCONTINUED] nebivolol  (BYSTOLIC) 5 MG tablet Take 1 tablet (5 mg total) by mouth 2 (two) times daily. 180 tablet 3   No current facility-administered medications on file prior to visit.    Allergies  Allergen Reactions  . Metformin And Related Itching  . Benicar Hct [Olmesartan Medoxomil-Hctz] Itching    Painful and itchy knots on palms of hands  . Bystolic [Nebivolol Hcl] Other (See Comments)    headache  . Dapsone Hives  . Lovaza [Omega-3-Acid Ethyl Esters] Other (See Comments)    Unknown reaction  . Niaspan Durene Cal  Er] Other (See Comments)    flushing  . Peanut-Containing Drug Products     Rash on hands from peanuts  . Septra [Bactrim] Hives    Family History  Problem Relation Age of Onset  . Cancer Mother        tumor on tonsil  . Stroke Maternal Grandfather   . Diabetes Neg Hx     BP (!) 142/84   Pulse 70   Ht 5' 10.5" (1.791 m)   Wt 230 lb (104.3 kg)   SpO2 99%   BMI 32.54 kg/m    Review of Systems He denies hypoglycemia    Objective:   Physical Exam VITAL SIGNS:  See vs page GENERAL: no distress Pulses: dorsalis pedis intact bilat.   MSK: no deformity of the feet CV: 1+ bilat leg edema Skin:  no ulcer on the feet, but the skin is dry and scaly.  normal color and temp on the feet.  There is spotty hyperpigmentation on the legs and feet.   Neuro: sensation is intact to touch on the feet.  Ext: there is bilateral onychomycosis of the toenails.   Lab Results  Component Value Date   HGBA1C 7.0 (A) 06/22/2020       Assessment & Plan:  Type 2 DM, with CAD: well-controlled.   HTN: is noted today.   Patient Instructions  Your blood pressure is high today.  Please see your primary care provider soon, to have it rechecked Please continue the same diabetes medications check your blood sugar once a day.  vary the time of day when you check, between before the 3 meals, and at bedtime.  also check if you have symptoms of your blood sugar being too high or too low.  please keep a  record of the readings and bring it to your next appointment here (or you can bring the meter itself).  You can write it on any piece of paper.  please call us sooner if your blood sugar goes below 70, or if you have a lot of readings over 200.  Please come back for a follow-up appointment in 5 months.

## 2020-07-01 ENCOUNTER — Telehealth: Payer: Self-pay

## 2020-07-01 NOTE — Chronic Care Management (AMB) (Addendum)
Chronic Care Management Pharmacy Assistant   Name: Ryan Morgan  MRN: [id number] DOB: 1941/01/07  Reason for Encounter: Disease State - General Adherence  Patient Question:  1.  Have you seen any other providers since your last visit? Yes 06/22/20- Dr. Renato Shin- Endocrinology 05/05/20- Dr. Elige Radon- Nephrology   PCP : Abner Greenspan, MD  Allergies:   Allergies  Allergen Reactions   Metformin And Related Itching   Benicar Hct [Olmesartan Medoxomil-Hctz] Itching    Painful and itchy knots on palms of hands   Bystolic [Nebivolol Hcl] Other (See Comments)    headache   Dapsone Hives   Lovaza [Omega-3-Acid Ethyl Esters] Other (See Comments)    Unknown reaction   Niaspan [Niacin Er] Other (See Comments)    flushing   Peanut-Containing Drug Products     Rash on hands from peanuts   Septra [Bactrim] Hives    Medications: Outpatient Encounter Medications as of 07/01/2020  Medication Sig Note   acarbose (PRECOSE) 25 MG tablet Take 1 tablet (25 mg total) by mouth 3 (three) times daily with meals. (Patient not taking: Reported on 12/24/2019)    aspirin EC 81 MG tablet Take 81 mg by mouth daily.    atorvastatin (LIPITOR) 20 MG tablet Take 1 tablet (20 mg total) by mouth daily.    carvedilol (COREG) 6.25 MG tablet Take 1 tablet (6.25 mg total) by mouth 2 (two) times daily with a meal.    cetirizine (ZYRTEC) 10 MG tablet Take 10 mg by mouth 2 (two) times daily.     cholecalciferol (VITAMIN D3) 25 MCG (1000 UNIT) tablet Take 3,000 Units by mouth daily.    clobetasol cream (TEMOVATE) 4.33 % Apply 1 application topically 2 (two) times daily. To affected areas (Patient taking differently: Apply 1 application topically 2 (two) times daily as needed (itching/rash). )    clopidogrel (PLAVIX) 75 MG tablet Take 1 tablet by mouth once daily    ezetimibe (ZETIA) 10 MG tablet Take 1 tablet by mouth once daily    glucose blood (ONETOUCH ULTRA) test strip 1 each by Other route in the morning and  at bedtime. And lancets 2/day    LANCETS ULTRA THIN 30G MISC by Does not apply route. One Touch    metFORMIN (GLUCOPHAGE-XR) 500 MG 24 hr tablet Take 1 tablet (500 mg total) by mouth daily with breakfast.    Multiple Vitamin (MULTIVITAMIN WITH MINERALS) TABS tablet Take 1 tablet by mouth daily.    Multiple Vitamins-Minerals (AIRBORNE) TBEF Take 1 tablet by mouth daily as needed (for immune system support). Immune Health/Support    nystatin cream (MYCOSTATIN) Apply 1 application topically 2 (two) times daily as needed for dry skin.    repaglinide (PRANDIN) 2 MG tablet TAKE 1 TABLET BY MOUTH THREE TIMES DAILY BEFORE MEAL(S)    sacubitril-valsartan (ENTRESTO) 49-51 MG Take 1 tablet by mouth 2 (two) times daily.    spironolactone (ALDACTONE) 25 MG tablet Take 1 tablet by mouth once daily    tetrahydrozoline 0.05 % ophthalmic solution Place 1-2 drops into both eyes 3 (three) times daily as needed (for dry/irritated eyes).    Triamcinolone Acetonide (NASACORT ALLERGY 24HR NA) Place 2 sprays into the nose at bedtime as needed (allergies).     [DISCONTINUED] nebivolol (BYSTOLIC) 5 MG tablet Take 1 tablet (5 mg total) by mouth 2 (two) times daily. 08/16/2011: headache   No facility-administered encounter medications on file as of 07/01/2020.    Current Diagnosis: Patient Active Problem  List   Diagnosis Date Noted   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/31/2019   Right leg pain 12/17/2018   Ischemic cardiomyopathy 09/10/2018   Hip joint effusion, left 05/03/2018   Laceration of arm 12/04/2017   Current chronic use of systemic steroids 11/15/2016   Routine general medical examination at a health care facility 11/09/2015   Hearing loss 11/09/2015   History of colonic polyps 11/09/2015   Rash of hands 03/10/2015   Colon cancer screening 06/09/2014   Blepharitis, bilateral 06/02/2014   Benign paroxysmal positional vertigo 12/14/2013   Bursitis of right hip 10/17/2013   Coronary artery  disease of native artery of native heart with stable angina pectoris ([place]) 03/19/2013   Encounter for Medicare annual wellness exam 03/05/2013   Adverse effect of glucocorticoid or synthetic analogue 03/05/2013   Osteopenia 03/05/2013   Class 1 obesity with serious comorbidity and body mass index (BMI) of 33.0 to 33.9 in adult 07/15/2011   Prostate cancer screening 07/10/2011   Hypertension 08/30/2010   Hyperlipidemia associated with type 2 diabetes mellitus ([place]) 08/30/2010   Diabetes mellitus type 2 with retinopathy ([place]) 08/30/2010   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   Benign essential hypertension 02/13/2008   Pure hypercholesterolemia 02/13/2008   Lab Results  Component Value Date/Time   HGBA1C 7.0 (A) 06/22/2020 09:48 AM   HGBA1C 6.9 (A) 02/21/2020 09:13 AM   HGBA1C 8.4 (H) 11/26/2018 11:27 AM   HGBA1C 7.6 (H) 08/24/2018 09:25 PM   BP Readings from Last 3 Encounters:  06/22/20 (!) 142/84  12/24/19 (!) 104/64  12/17/19 122/76    Contacted Ryan Morgan for general adherence call.  Since last visit with Debbora Dus, CPP on 04/13/20, no interventions have been made.  The patient has not had an ED visit since their last CPP follow up. The patients current Chronic and PDC medications are: Atorvastatin 20 mg he takes 1 tablet daily, metformin 500 mg takes 1 tablet daily,  Entresto 49-51 mg 1 tablet twice daily.  Debbora Dus to review for a greater than 5 day gap between last fill. The patient denies any recent problems with their health. The patient denies any problems with their pharmacy or any side effects from their medicines. The patient has no recommendations for improvements in managing care. Ryan Morgan states that last time he checked his blood pressure about 1 week ago and it was a little high, 140/80. He did not record a pulse rate. He states his recent fasting blood sugars are as follows: 149, 107, 137, 155, 122, 149.  Follow-Up:  Pharmacist Review  Debbora Dus, CPP  notified  Margaretmary Dys, [place] Assistant [phone]  Reviewed endocrinology note from 06/22/20, A1c controlled no med changes. BP elevated, endo recommend follow up with PCP. Will have CMA check in next month to review his home BP readings.   Debbora Dus, PharmD Clinical Pharmacist [facility] at [facility] [phone]

## 2020-08-05 ENCOUNTER — Telehealth: Payer: Self-pay

## 2020-08-05 NOTE — Chronic Care Management (AMB) (Addendum)
Chronic Care Management Pharmacy Assistant   Name: Ryan Morgan  MRN: [id number] DOB: [birth year]  Reason for Encounter: Disease State- Hypertension   Conditions to be addressed/monitored: HTN  Recent office visits:  No recent office   Recent consult visits:  No recent consults  Hospital visits:  None in previous 6 months  Medications: Outpatient Encounter Medications as of 08/05/2020  Medication Sig Note   acarbose (PRECOSE) 25 MG tablet Take 1 tablet (25 mg total) by mouth 3 (three) times daily with meals. (Patient not taking: Reported on 12/24/2019)    aspirin EC 81 MG tablet Take 81 mg by mouth daily.    atorvastatin (LIPITOR) 20 MG tablet Take 1 tablet (20 mg total) by mouth daily.    carvedilol (COREG) 6.25 MG tablet Take 1 tablet (6.25 mg total) by mouth 2 (two) times daily with a meal.    cetirizine (ZYRTEC) 10 MG tablet Take 10 mg by mouth 2 (two) times daily.     cholecalciferol (VITAMIN D3) 25 MCG (1000 UNIT) tablet Take 3,000 Units by mouth daily.    clobetasol cream (TEMOVATE) 9.35 % Apply 1 application topically 2 (two) times daily. To affected areas (Patient taking differently: Apply 1 application topically 2 (two) times daily as needed (itching/rash). )    clopidogrel (PLAVIX) 75 MG tablet Take 1 tablet by mouth once daily    ezetimibe (ZETIA) 10 MG tablet Take 1 tablet by mouth once daily    glucose blood (ONETOUCH ULTRA) test strip 1 each by Other route in the morning and at bedtime. And lancets 2/day    LANCETS ULTRA THIN 30G MISC by Does not apply route. One Touch    metFORMIN (GLUCOPHAGE-XR) 500 MG 24 hr tablet Take 1 tablet (500 mg total) by mouth daily with breakfast.    Multiple Vitamin (MULTIVITAMIN WITH MINERALS) TABS tablet Take 1 tablet by mouth daily.    Multiple Vitamins-Minerals (AIRBORNE) TBEF Take 1 tablet by mouth daily as needed (for immune system support). Immune Health/Support    nystatin cream (MYCOSTATIN) Apply 1 application topically 2 (two)  times daily as needed for dry skin.    repaglinide (PRANDIN) 2 MG tablet TAKE 1 TABLET BY MOUTH THREE TIMES DAILY BEFORE MEAL(S)    sacubitril-valsartan (ENTRESTO) 49-51 MG Take 1 tablet by mouth 2 (two) times daily.    spironolactone (ALDACTONE) 25 MG tablet Take 1 tablet by mouth once daily    tetrahydrozoline 0.05 % ophthalmic solution Place 1-2 drops into both eyes 3 (three) times daily as needed (for dry/irritated eyes).    Triamcinolone Acetonide (NASACORT ALLERGY 24HR NA) Place 2 sprays into the nose at bedtime as needed (allergies).     [DISCONTINUED] nebivolol (BYSTOLIC) 5 MG tablet Take 1 tablet (5 mg total) by mouth 2 (two) times daily. 08/16/2011: headache   No facility-administered encounter medications on file as of 08/05/2020.    Recent Office Vitals: BP Readings from Last 3 Encounters:  06/22/20 (!) 142/84  12/24/19 (!) 104/64  12/17/19 122/76   Pulse Readings from Last 3 Encounters:  06/22/20 70  02/21/20 66  12/24/19 66    Wt Readings from Last 3 Encounters:  06/22/20 230 lb (104.3 kg)  02/21/20 235 lb (106.6 kg)  12/24/19 (!) 229 lb 6.4 oz (104.1 kg)     Kidney Function Lab Results  Component Value Date/Time   CREATININE 0.90 12/03/2019 09:39 AM   CREATININE 0.95 09/12/2019 09:28 AM   CREATININE 0.89 12/26/2012 11:06 AM   GFR 81.75  12/03/2019 09:39 AM   GFRNONAA >60 09/12/2019 09:28 AM   GFRAA >60 09/12/2019 09:28 AM    BMP Latest Ref Rng & Units 12/03/2019 09/12/2019 11/26/2018  Glucose 70 - 99 mg/dL 171(H) 188(H) 156(H)  BUN 6 - 23 mg/dL 17 7(L) 9  Creatinine 0.40 - 1.50 mg/dL 0.90 0.95 0.84  BUN/Creat Ratio 10 - 24 - - -  Sodium 135 - 145 mEq/L 140 140 141  Potassium 3.5 - 5.1 mEq/L 4.3 4.5 4.1  Chloride 96 - 112 mEq/L 104 103 104  CO2 19 - 32 mEq/L 29 27 29   Calcium 8.4 - 10.5 mg/dL 8.7 9.0 8.5    Current antihypertensive regimen: (CHF) Spironolactone 25 mg - 1 tablet daily Carvedilol 6.25 mg - 1 tablet BID  How often are you checking your Blood  Pressure? infrequently  he checks his blood pressure in the evening after taking his medication.  Current home BP readings: States he checks BP occasionally and has not been recording his BP readings.  Wrist or arm cuff: Arm Caffeine intake: Very little Salt intake: Avoids salt  OTC medications including pseudoephedrine or NSAIDs?No  What recent interventions/DTPs have been made by any provider to improve Blood Pressure control since last CPP Visit: No  Any recent hospitalizations or ED visits since last visit with CPP? No  What diet changes have been made to improve Blood Pressure Control?  No changes  What exercise is being done to improve your Blood Pressure Control?  States exercise has decreased due to Covid-19  Adherence Review: Is the patient currently on ACE/ARB medication? Yes - in [place] Does the patient have >5 day gap between last estimated fill dates? CPP to review   Star Rating Drugs: Atorvastatin 20 mg- last fill 06/01/20 90 DS  (stopped 2 weeks ago due to leg pain) Metformin 500 mg- last fill 06/01/20 90 DS Repaglinide 2 mg- Last fill 06/26/20 90 DS Entresto 49-51 mg- last fill 06/02/20 90 DS  Patient has not been recording his blood pressure readings. Asked him to start checking about once a week and record the readings. Will follow back up mid April. Patient also notes that he stopped taking atorvastatin about 2 weeks ago due to leg cramps. Denies speaking with his doctor regarding this. Notes leg pain has improved.   Follow-Up:  Pharmacist Review  Debbora Dus, CPP notified  Margaretmary Dys, [place] Assistant [phone]  ------  Attempted to call patient to discuss statin concerns. Left voicemail for patient to call back. Due to CAD and diabetes, would like to try to resume statin at a lower dose and ensure patient is discuss ways to prevent leg cramps.  Debbora Dus, PharmD Clinical Pharmacist [facility] at [facility] [phone]  Total Time spent for month: 40

## 2020-08-19 ENCOUNTER — Other Ambulatory Visit: Payer: Self-pay | Admitting: Cardiovascular Disease

## 2020-08-20 NOTE — Telephone Encounter (Signed)
Rx request sent to pharmacy.  

## 2020-08-31 ENCOUNTER — Other Ambulatory Visit: Payer: Self-pay | Admitting: Endocrinology

## 2020-08-31 ENCOUNTER — Other Ambulatory Visit: Payer: Self-pay | Admitting: Cardiovascular Disease

## 2020-09-01 ENCOUNTER — Telehealth: Payer: Self-pay

## 2020-09-01 NOTE — Chronic Care Management (AMB) (Addendum)
Chronic Care Management Pharmacy Assistant   Name: Ryan Morgan  MRN: [id number] DOB: [birth year]   Reason for Encounter: Disease State Hypertension call   Conditions to be addressed/monitored: HTN  Recent office visits:  None since last CCM contact  Recent consult visits:  None since last CCM contact  Hospital visits:  None in previous 6 months  Medications: Outpatient Encounter Medications as of 09/01/2020  Medication Sig Note   acarbose (PRECOSE) 25 MG tablet Take 1 tablet (25 mg total) by mouth 3 (three) times daily with meals. (Patient not taking: Reported on 12/24/2019)    aspirin EC 81 MG tablet Take 81 mg by mouth daily.    atorvastatin (LIPITOR) 20 MG tablet Take 1 tablet (20 mg total) by mouth daily.    carvedilol (COREG) 6.25 MG tablet TAKE 1 TABLET BY MOUTH TWICE DAILY WITH MEALS    cetirizine (ZYRTEC) 10 MG tablet Take 10 mg by mouth 2 (two) times daily.     cholecalciferol (VITAMIN D3) 25 MCG (1000 UNIT) tablet Take 3,000 Units by mouth daily.    clobetasol cream (TEMOVATE) 9.62 % Apply 1 application topically 2 (two) times daily. To affected areas (Patient taking differently: Apply 1 application topically 2 (two) times daily as needed (itching/rash). )    clopidogrel (PLAVIX) 75 MG tablet Take 1 tablet by mouth once daily    ezetimibe (ZETIA) 10 MG tablet Take 1 tablet by mouth once daily    glucose blood (ONETOUCH ULTRA) test strip 1 each by Other route in the morning and at bedtime. And lancets 2/day    LANCETS ULTRA THIN 30G MISC by Does not apply route. One Touch    metFORMIN (GLUCOPHAGE-XR) 500 MG 24 hr tablet Take 1 tablet (500 mg total) by mouth daily with breakfast.    Multiple Vitamin (MULTIVITAMIN WITH MINERALS) TABS tablet Take 1 tablet by mouth daily.    Multiple Vitamins-Minerals (AIRBORNE) TBEF Take 1 tablet by mouth daily as needed (for immune system support). Immune Health/Support    nystatin cream (MYCOSTATIN) Apply 1 application topically 2 (two)  times daily as needed for dry skin.    repaglinide (PRANDIN) 2 MG tablet TAKE 1 TABLET BY MOUTH THREE TIMES DAILY BEFORE MEAL(S)    sacubitril-valsartan (ENTRESTO) 49-51 MG Take 1 tablet by mouth 2 (two) times daily.    spironolactone (ALDACTONE) 25 MG tablet Take 1 tablet by mouth once daily    tetrahydrozoline 0.05 % ophthalmic solution Place 1-2 drops into both eyes 3 (three) times daily as needed (for dry/irritated eyes).    Triamcinolone Acetonide (NASACORT ALLERGY 24HR NA) Place 2 sprays into the nose at bedtime as needed (allergies).     [DISCONTINUED] nebivolol (BYSTOLIC) 5 MG tablet Take 1 tablet (5 mg total) by mouth 2 (two) times daily. 08/16/2011: headache   No facility-administered encounter medications on file as of 09/01/2020.    Recent Office Vitals: BP Readings from Last 3 Encounters:  06/22/20 (!) 142/84  12/24/19 (!) 104/64  12/17/19 122/76   Pulse Readings from Last 3 Encounters:  06/22/20 70  02/21/20 66  12/24/19 66    Wt Readings from Last 3 Encounters:  06/22/20 230 lb (104.3 kg)  02/21/20 235 lb (106.6 kg)  12/24/19 (!) 229 lb 6.4 oz (104.1 kg)     Kidney Function Lab Results  Component Value Date/Time   CREATININE 0.90 12/03/2019 09:39 AM   CREATININE 0.95 09/12/2019 09:28 AM   CREATININE 0.89 12/26/2012 11:06 AM   GFR 81.75 12/03/2019  09:39 AM   GFRNONAA >60 09/12/2019 09:28 AM   GFRAA >60 09/12/2019 09:28 AM    BMP Latest Ref Rng & Units 12/03/2019 09/12/2019 11/26/2018  Glucose 70 - 99 mg/dL 171(H) 188(H) 156(H)  BUN 6 - 23 mg/dL 17 7(L) 9  Creatinine 0.40 - 1.50 mg/dL 0.90 0.95 0.84  BUN/Creat Ratio 10 - 24 - - -  Sodium 135 - 145 mEq/L 140 140 141  Potassium 3.5 - 5.1 mEq/L 4.3 4.5 4.1  Chloride 96 - 112 mEq/L 104 103 104  CO2 19 - 32 mEq/L 29 27 29   Calcium 8.4 - 10.5 mg/dL 8.7 9.0 8.5     Current antihypertensive regimen:   Spironolactone 25 mg - 1 tablet daily  Carvedilol 6.25 mg - 1 tablet BID   Patient verbally confirms he is  taking the above medications as directed. Yes  How often are you checking your Blood Pressure? twice daily  he checks his blood pressure in the morning after taking his medication.  Current home BP readings:   DATE:             BP               PULSE 08/31/2020 139/75 08/25/2020 136/76 08/22/2020 147/80 08/21/2020 130/81 08/20/2020 145/82 08/19/2020 136/83   Wrist or arm cuff: arm cuff Caffeine intake: none Salt intake: limits OTC medications including pseudoephedrine or NSAIDs? Last 5 days on prednisone for  wegeners   Any readings above 180/120? No If yes any symptoms of hypertensive emergency? patient denies any symptoms of high blood pressure   What recent interventions/DTPs have been made by any provider to improve Blood Pressure control since last CPP Visit:   none identified  Any recent hospitalizations or ED visits since last visit with CPP? No  What diet changes have been made to improve Blood Pressure Control?   He is watching his salt intake, eating healthy meals  What exercise is being done to improve your Blood Pressure Control?    He is trying to get out and work in the yard for exercise due to warmer weather.  Adherence Review: Is the patient currently on ACE/ARB medication? Yes Does the patient have >5 day gap between last estimated fill dates? CPP to review   Star Rating Drugs:  Medication:  Last Fill: Day Supply Atorvastatin 20mg  06/01/2020 90ds  Metformin XR 500 mg.08/31/2020 90ds prandin 2 mg.  06/26/2020 90ds Entresto 49-51mg . 06/02/2020 90ds  Pt. Also provided some BG readings as follows:  90 day avg.135, 30 day avg. 131 pt. States elevation due to short course of prednisone   Follow-Up:  Pharmacist Review  Debbora Dus, CPP notified  Avel Sensor, [place] Assistant [phone]  I have reviewed the care management and care coordination activities outlined in this encounter and I am certifying that I agree with the content of this  note. No further action required.  Debbora Dus, PharmD Clinical Pharmacist [facility] at [facility] [phone]

## 2020-09-08 ENCOUNTER — Encounter: Payer: Self-pay | Admitting: Family Medicine

## 2020-09-21 IMAGING — US RIGHT LOWER EXTREMITY VENOUS ULTRASOUND
1 series · 13 of 24 positions shown · non-contrast
Comparison: None.

CLINICAL DATA: Right lower extremity pain for the past 3 days.
Evaluate for DVT.



[Series 1: right lower extremity venous ultrasound · 0.08mm/px · 13 of 37 slices shown]
[im 1/37]
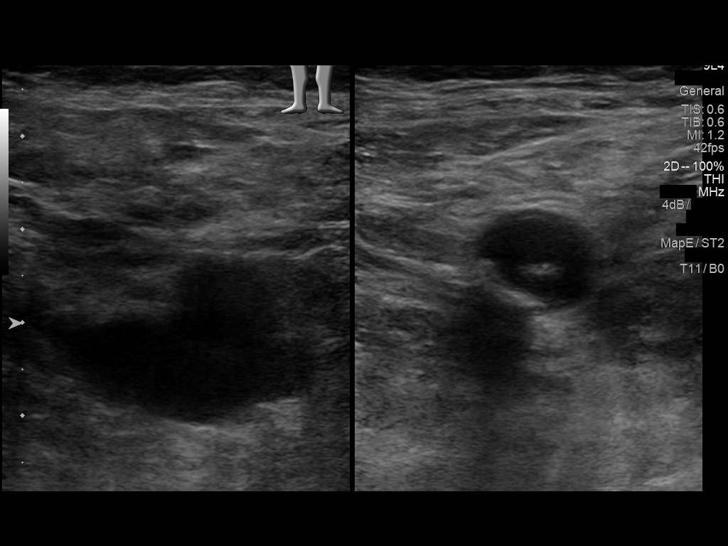
[im 4/37]
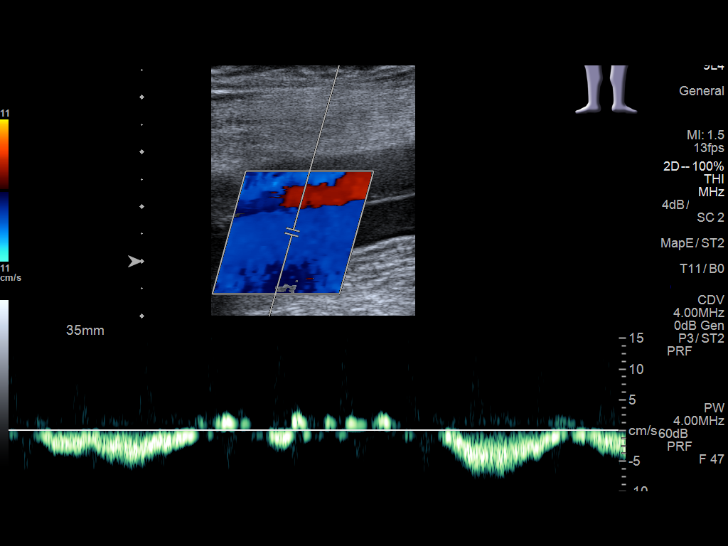
[im 7/37]
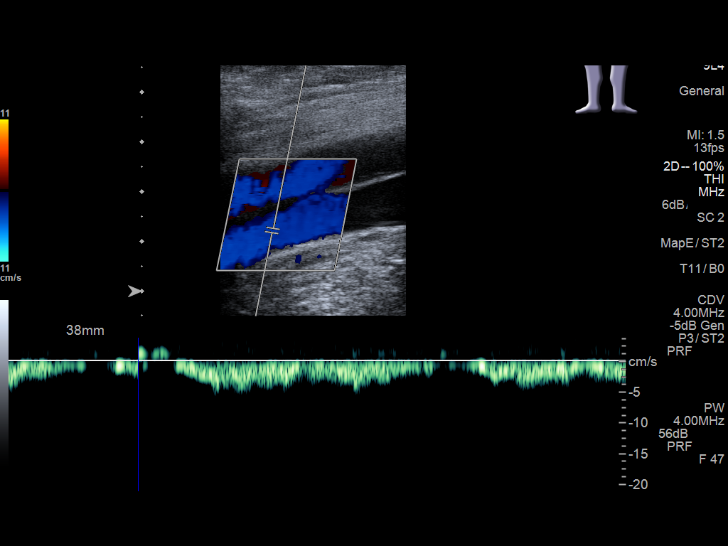
[im 10/37]
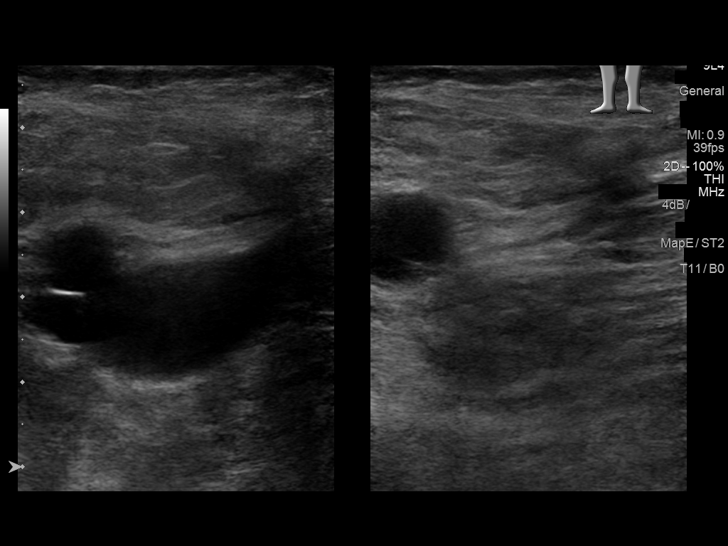
[im 13/37]
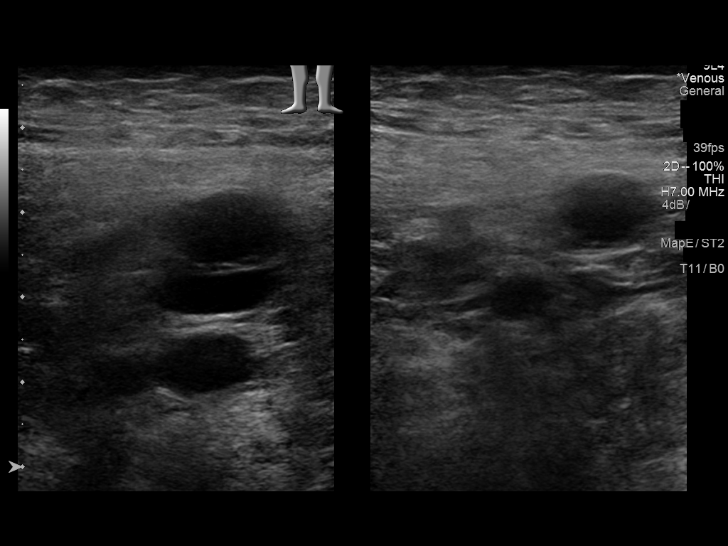
[im 16/37]
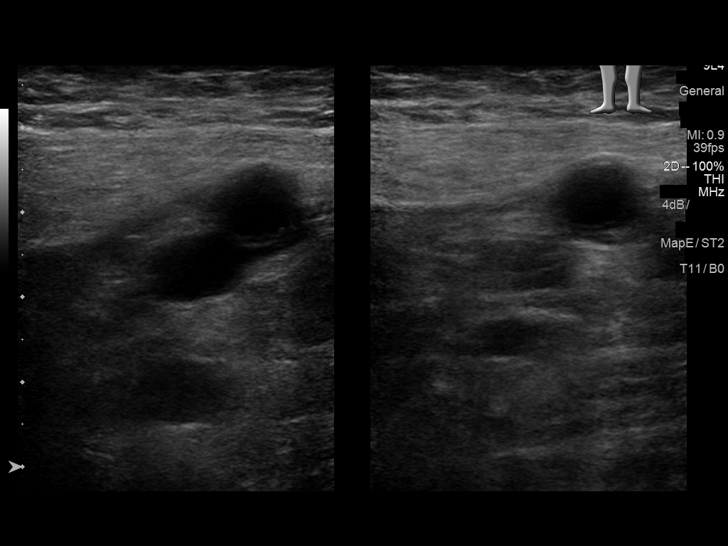
[im 19/37]
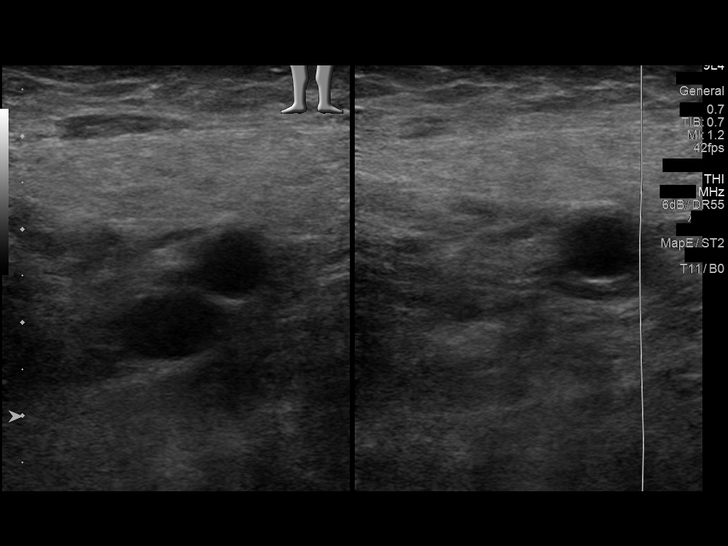
[im 21/37]
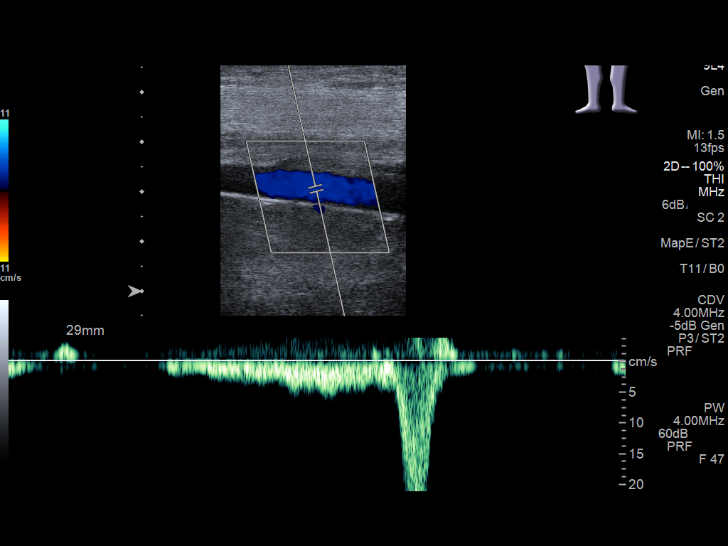
[im 24/37]
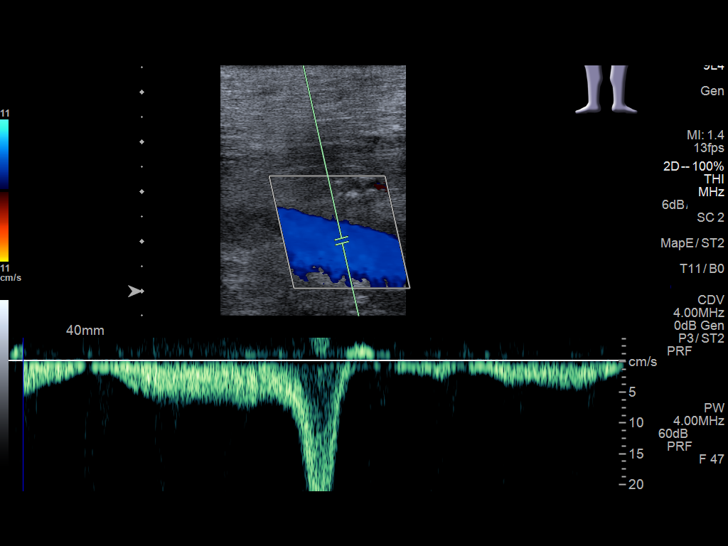
[im 27/37]
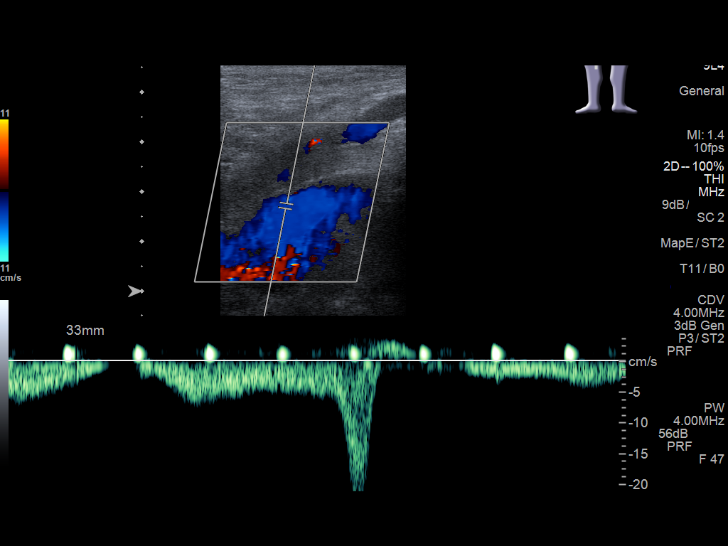
[im 30/37]
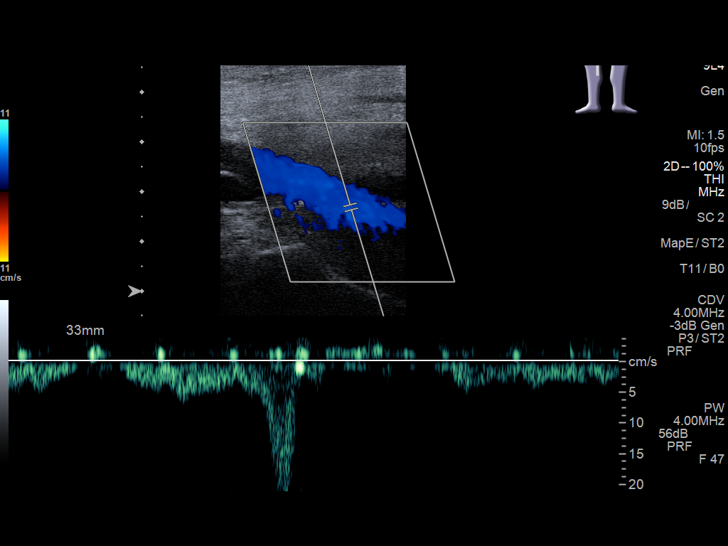
[im 33/37]
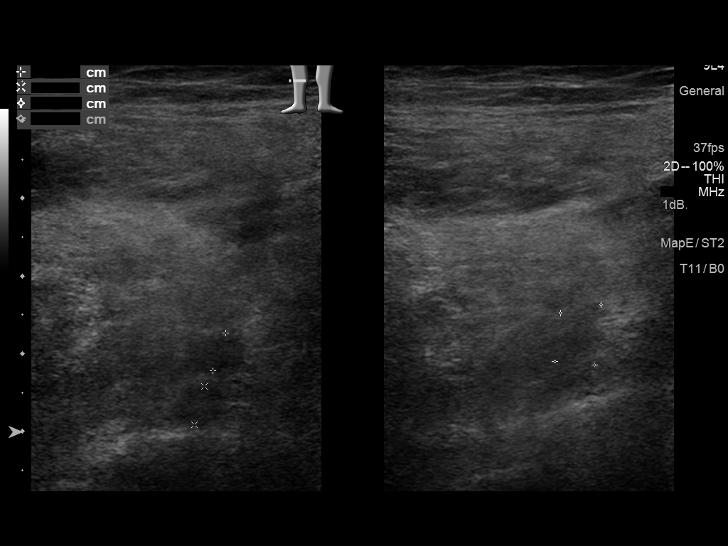
[im 37/37]
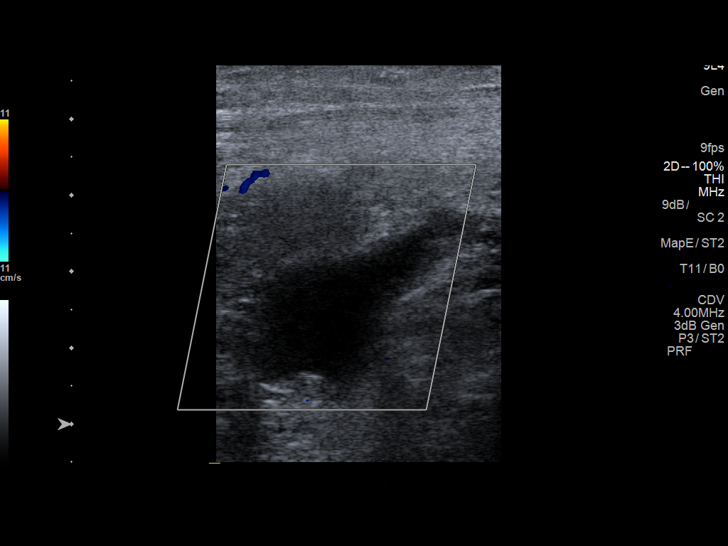

[13 of 24 positions shown; findings below may reference images not displayed]

FINDINGS: Contralateral Common Femoral Vein: Respiratory phasicity is normal
and symmetric with the symptomatic side. No evidence of thrombus.
Normal compressibility.

Common Femoral Vein: No evidence of thrombus. Normal
compressibility, respiratory phasicity and response to augmentation.

Saphenofemoral Junction: No evidence of thrombus. Normal
compressibility and flow on color Doppler imaging.

Profunda Femoral Vein: No evidence of thrombus. Normal
compressibility and flow on color Doppler imaging.

Femoral Vein: No evidence of thrombus. Normal compressibility,
respiratory phasicity and response to augmentation.

Popliteal Vein: No evidence of thrombus. Normal compressibility,
respiratory phasicity and response to augmentation.

Calf Veins: No evidence of thrombus. Normal compressibility and flow
on color Doppler imaging.

Superficial Great Saphenous Vein: No evidence of thrombus. Normal
compressibility.

Venous Reflux:  None.

Other Findings: Note made of an approximately 3.6 x 2.6 x 1.7 cm
serpiginous fluid collection with the right popliteal fossa
compatible with a Baker cyst.
IMPRESSION: 1. No evidence of DVT within the right lower extremity.
2. Incidentally noted approximately 3.6 cm right-sided Baker's cyst.

## 2020-09-22 NOTE — Progress Notes (Signed)
Date:  09/23/2020   ID:  [name], DOB Mar 07, 1941, MRN [id number]  Patient Location:  [place] [address]   Provider location:   [facility], [place] office  PCP:  Glori Bickers, Wynelle Fanny, MD  Cardiologist:  Arvid Right [facility]  Chief Complaint  Patient presents with  . 12 month follow up     Patient stopped atorvastatin due to muscle aches for about two months now. Medications reviewed by the patient verbally.      History of Present Illness:    Ryan Morgan is a 80 y.o. male  past medical history of DM,  smoking hx,  coronary artery disease,  stent placed to his LAD in 2000,  (Lad stent 3.0 x 18 in 2000) repeat catheterization in January 2003 with 40% LAD, 50% diagonal disease, history of Wegener's granulomatosis, on chronic steroids diabetes. Prior thoracotomy surgery on the left for lung nodule, chronic pain on the left that is episodic, stable over several years Chronic shortness of breath, sedentary lifestyle, obesity/deconditioning Stent October 2018 for shortness of breath, chest tightness, fatigue Echocardiogram September 12, 2017 Ejection fraction 30 to 35% 2018:Successful angioplasty and drug-eluting stent placement to the ostial right posterior AV groove artery. who presents for follow up of his CAD and shortness of breath   In follow-up today, reports he is active, likes to go camping once a month with a RV group, goes with his wife No regular exercise program  Reports having some myalgias past several months, stopped Lipitor, symptoms resolved " Cholesterol not that bad anyway"  Reports feeling better on the Entresto Denies angina  Lab work reviewed A1C 7.0  Last echocardiogram June 2021 ejection fraction 35 to 40% up from 30 to 35%  EKG personally reviewed by myself on todays visit Shows NSR rate 68 bpm, old inferior MI  Other past medical hx March 2020 Did not feel well, may have had some nausea, diaphoresis Got  inside, nearsyncope  Felt by the hospital team that he could have had a vagal event given the  prodrome of nausea and sweating Tropon negative x3. Lab work looks normal, no sense of dehydration He does not remember much of a prodrome of nausea or sweating before feeling dizzy Heart rate 55 and higher in the hospital, into the 60s  Prior CV studies:   The following studies were reviewed today:  Other past medical history reviewed Echocardiogram October 2018 ejection fraction 30 to 35%  Stress test October 2018 prior MI Prior MI, no significant ischemia Ejection fraction 36%  Cardiac catheterization October 2018 Patent LAD stent mild to moderate in-stent restenosis Occluded diagonal #1 Occluded right PDA with left-to-right collaterals Severe disease right posterior AV groove artery with collaterals Ejection fraction 40% Successful angioplasty stent to ostial right posterior AV groove artery Ejection fraction at that time 40%  Echocardiogram September 12, 2017 Ejection fraction 30 to 35%  Stress test from January 2009 was a treadmill study, ejection fraction estimated at 45%, with medium-sized area of moderate to severe hypoperfusion involving the septal region.  Past Medical History:  Diagnosis Date  . Coronary artery disease    a. prior LAD stenting 2000; b. [place] 2003 LAD 40, D1 50; c. Lexiscan 02/2017: large defect of severe severity in the basal inferoseptal, basal inferior, mid inferoseptal, mid inferior and apical inferior location, high risk; d. LHC 03/29/17: LM nl, patent LAD stent w/ 40% ISR, D1 100,  OM1 90, RPDA 100 w/ L-R collats,  post atrio 80% s/p PCI/DES    . Diabetes mellitus   . Hyperlipidemia   . Hypertension   . Ischemic cardiomyopathy    a. TTE 02/2017: EF 30-35%, diffuse HK, normal LV diastolic function parameters, mild AI/MR, RV systolic function normal, PASP normal  . Lung nodule    a. s/p prior thoracotomy  . Morbid obesity ([place])   . Wegener's  granulomatosis 2007   Past Surgical History:  Procedure Laterality Date  . BASAL CELL CARCINOMA EXCISION     removed from face x 3  . CARDIAC CATHETERIZATION    . CARDIAC SURGERY  2000   stent in heart  . CORONARY ANGIOPLASTY  09/22/1998   s/p stent placement; [facility]istar 3.0 x 18 mm Ref [id number]  . CORONARY STENT INTERVENTION N/A 03/29/2017   Procedure: CORONARY STENT INTERVENTION;  Surgeon: Wellington Hampshire, MD;  Location: [facility];  Service: Cardiovascular;  Laterality: N/A;  . LEFT HEART CATH AND CORONARY ANGIOGRAPHY N/A 03/29/2017   Procedure: LEFT HEART CATH AND CORONARY ANGIOGRAPHY;  Surgeon: Wellington Hampshire, MD;  Location: [facility] [facility];  Service: Cardiovascular;  Laterality: N/A;  . 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 ULTRASOUND GUIDANCE FOR VASCULAR ACCESS  03/29/2017   Procedure: Ultrasound Guidance For Vascular Access;  Surgeon: Wellington Hampshire, MD;  Location: [place];  Service: Cardiovascular;;     Current Meds  Medication Sig  . acarbose (PRECOSE) 25 MG tablet Take 1 tablet (25 mg total) by mouth 3 (three) times daily with meals.  Marland Kitchen aspirin EC 81 MG tablet Take 81 mg by mouth daily.  . carvedilol (COREG) 6.25 MG tablet TAKE 1 TABLET BY MOUTH TWICE DAILY WITH MEALS  . cetirizine (ZYRTEC) 10 MG tablet Take 10 mg by mouth 2 (two) times daily.   . cholecalciferol (VITAMIN D3) 25 MCG (1000 UNIT) tablet Take 3,000 Units by mouth daily.  . clobetasol cream (TEMOVATE) [id number] % Apply 1 application topically 2 (two) times daily. To affected areas  . clopidogrel (PLAVIX) 75 MG tablet Take 1 tablet by mouth once daily  . ezetimibe (ZETIA) 10 MG tablet Take 1 tablet by mouth once daily  . glucose blood (ONETOUCH ULTRA) test strip 1 each by Other route in the morning and at bedtime. And lancets 2/day  . LANCETS ULTRA THIN 30G MISC by Does not apply route. One Touch  . metFORMIN (GLUCOPHAGE-XR) 500 MG 24 hr tablet Take 1 tablet (500  mg total) by mouth daily with breakfast.  . Multiple Vitamin (MULTIVITAMIN WITH MINERALS) TABS tablet Take 1 tablet by mouth daily.  . Multiple Vitamins-Minerals (AIRBORNE) TBEF Take 1 tablet by mouth daily as needed (for immune system support). Immune Health/Support  . nystatin cream (MYCOSTATIN) Apply 1 application topically 2 (two) times daily as needed for dry skin.  . repaglinide (PRANDIN) 2 MG tablet TAKE 1 TABLET BY MOUTH THREE TIMES DAILY BEFORE MEAL(S)  . sacubitril-valsartan (ENTRESTO) 49-51 MG Take 1 tablet by mouth 2 (two) times daily.  Marland Kitchen spironolactone (ALDACTONE) 25 MG tablet Take 1 tablet by mouth once daily  . tetrahydrozoline 0.05 % ophthalmic solution Place 1-2 drops into both eyes 3 (three) times daily as needed (for dry/irritated eyes).  . Triamcinolone Acetonide (NASACORT ALLERGY 24HR NA) Place 2 sprays into the nose at bedtime as needed (allergies).      Allergies:   Metformin and related, Benicar hct [olmesartan medoxomil-hctz], Bystolic [nebivolol hcl], Dapsone, Lovaza [omega-3-acid ethyl esters],  Niaspan [niacin er], Peanut-containing drug products, and Septra [bactrim]   Social History   Tobacco Use  . Smoking status: Former Smoker    Packs/day: 0.50    Years: 20.00    Pack years: 10.00    Types: Cigarettes    Quit date: 05/30/1978    Years since quitting: 42.3  . Smokeless tobacco: Never Used  Vaping Use  . Vaping Use: Never used  Substance Use Topics  . Alcohol use: No    Alcohol/week: 0.0 standard drinks  . Drug use: No     Family Hx: The patient's family history includes Cancer in his mother; Stroke in his maternal grandfather. There is no history of Diabetes.  ROS:   Please see the history of present illness.    Review of Systems  Constitutional: Negative.   Respiratory: Negative.   Cardiovascular: Negative.   Gastrointestinal: Negative.   Musculoskeletal: Negative.   Neurological: Positive for dizziness and loss of consciousness.   Psychiatric/Behavioral: Negative.   All other systems reviewed and are negative.    Labs/Other Tests and Data Reviewed:    Recent Labs: 12/03/2019: ALT 12; BUN 17; Creatinine, Ser 0.90; Hemoglobin 12.3; Platelets 151.0; Potassium 4.3; Sodium 140; TSH 1.30   Recent Lipid Panel Lab Results  Component Value Date/Time   CHOL 114 12/03/2019 09:39 AM   TRIG 175.0 (H) 12/03/2019 09:39 AM   HDL 37.20 (L) 12/03/2019 09:39 AM   CHOLHDL 3 12/03/2019 09:39 AM   LDLCALC 42 12/03/2019 09:39 AM   LDLDIRECT 100.0 05/17/2017 01:49 PM    Wt Readings from Last 3 Encounters:  09/23/20 232 lb 6 oz (105.4 kg)  06/22/20 230 lb (104.3 kg)  02/21/20 235 lb (106.6 kg)     Exam:    Vital Signs: Vital signs may also be detailed in the HPI BP 128/80 (BP Location: Left Arm, Patient Position: Sitting, Cuff Size: Normal)   Pulse 68   Ht 5' 10.5" (1.791 m)   Wt 232 lb 6 oz (105.4 kg)   SpO2 98%   BMI 32.87 kg/m    Constitutional:  oriented to person, place, and time. No distress.  HENT:  Head: Grossly normal Eyes:  no discharge. No scleral icterus.  Neck: No JVD, no carotid bruits  Cardiovascular: Regular rate and rhythm, no murmurs appreciated Pulmonary/Chest: Clear to auscultation bilaterally, no wheezes or rails Abdominal: Soft.  no distension.  no tenderness.  Musculoskeletal: Normal range of motion Neurological:  normal muscle tone. Coordination normal. No atrophy Skin: Skin warm and dry Psychiatric: normal affect, pleasant   ASSESSMENT & PLAN:    Coronary artery disease of native artery of native heart with stable angina pectoris (HCC) No further episodes of near syncope or syncope Denies anginal symptoms, stopped Lipitor secondary to myalgias Discussed various options for cholesterol management, Order placed for lipid recheck, goal LDL less than 70 Discussed alternate statin such as Crestor, he even Discussed PCSK9 inhibitors.  Not particularly eager.  He will remain on Zetia at this  time  Granulomatosis with polyangiitis, unspecified whether renal involvement ([place]) Stable symptoms for the patient  Morbid obesity ([place]) Sedentary, poor diet, Recommend he try to do regular exercise This was also discussed with patient's wife who is also patient, need for regular walking for gait stability  Essential hypertension Blood pressure is well controlled on today's visit. No changes made to the medications. Denies orthostasis  Shortness of breath Recommend walking for conditioning  Ischemic cardiomyopathy Continue Entresto, carvedilol, spironolactone Recommend he discuss [place] and  Jardiance with endocrinology Significant cardiac benefits  Near syncope No further episodes near syncope or vagal episodes   Total encounter time more than 25 minutes  Greater than 50% was spent in counseling and coordination of care with the patient  Signed, Ida Rogue, MD  09/23/2020 8:44 AM    [place] [facility] [address], [place], [place] [address]

## 2020-09-23 ENCOUNTER — Other Ambulatory Visit: Payer: Self-pay

## 2020-09-23 ENCOUNTER — Encounter: Payer: Self-pay | Admitting: Cardiovascular Disease

## 2020-09-23 ENCOUNTER — Ambulatory Visit (INDEPENDENT_AMBULATORY_CARE_PROVIDER_SITE_OTHER): Payer: Medicare HMO | Admitting: Cardiovascular Disease

## 2020-09-23 ENCOUNTER — Other Ambulatory Visit: Payer: Self-pay | Admitting: Cardiovascular Disease

## 2020-09-23 VITALS — BP 128/80 | HR 68 | Ht 70.5 in | Wt 232.4 lb

## 2020-09-23 DIAGNOSIS — T466X5A Adverse effect of antihyperlipidemic and antiarteriosclerotic drugs, initial encounter: Secondary | ICD-10-CM

## 2020-09-23 DIAGNOSIS — M791 Myalgia, unspecified site: Secondary | ICD-10-CM

## 2020-09-23 DIAGNOSIS — E11319 Type 2 diabetes mellitus with unspecified diabetic retinopathy without macular edema: Secondary | ICD-10-CM

## 2020-09-23 DIAGNOSIS — E7800 Pure hypercholesterolemia, unspecified: Secondary | ICD-10-CM

## 2020-09-23 DIAGNOSIS — R0602 Shortness of breath: Secondary | ICD-10-CM

## 2020-09-23 DIAGNOSIS — I25118 Atherosclerotic heart disease of native coronary artery with other forms of angina pectoris: Secondary | ICD-10-CM | POA: Diagnosis not present

## 2020-09-23 DIAGNOSIS — I255 Ischemic cardiomyopathy: Secondary | ICD-10-CM | POA: Diagnosis not present

## 2020-09-23 DIAGNOSIS — I10 Essential (primary) hypertension: Secondary | ICD-10-CM

## 2020-09-23 NOTE — Patient Instructions (Addendum)
Medication Instructions:  STOP aspirin  Ask endocrine about Wilder Glade or jardiance  If you need a refill on your cardiac medications before your next appointment, please call your pharmacy.    Lab work: lipids   LABS WILL APPEAR ON MYCHART, ABNORMAL RESULTS  WILL BE CALLED  Testing/Procedures: No new testing needed  Follow-Up:  . You will need a follow up appointment in 6 months  . Providers on your designated Care Team:   . Murray Hodgkins, NP . Christell Faith, PA-C . Marrianne Mood, PA-C  COVID-19 Vaccine Information can be found at: ShippingScam.co.uk For questions related to vaccine distribution or appointments, please email [email or web address] .[email or web address] or call 6464760615.

## 2020-09-24 LAB — LIPID PANEL
Chol/HDL Ratio: 5.4 ratio — ABNORMAL HIGH (ref 0.0–5.0)
Cholesterol, Total: 185 mg/dL (ref 100–199)
HDL: 34 mg/dL — ABNORMAL LOW (ref >39)
LDL Chol Calc (NIH): 104 mg/dL — ABNORMAL HIGH (ref 0–99)
Triglycerides: 274 mg/dL — ABNORMAL HIGH (ref 0–149)
VLDL Cholesterol Cal: 47 mg/dL — ABNORMAL HIGH (ref 5–40)

## 2020-09-29 ENCOUNTER — Telehealth: Payer: Self-pay | Admitting: Cardiovascular Disease

## 2020-09-29 MED ORDER — ROSUVASTATIN CALCIUM 10 MG PO TABS: 10.0000 mg | ORAL_TABLET | Freq: Every day | ORAL | 3 refills | Status: DC

## 2020-09-29 NOTE — Telephone Encounter (Signed)
Able to reach pt regarding his recent lab work, Dr. Rockey Situ had a chance to review his results and advised   "Cholesterol elevated 185 need less than 150  LDL elevated 104 need less than 70 preferably 60  Only on Zetia, stopped Lipitor secondary to muscle ache  Would start Crestor 10 mg daily  Or could choose the injection medication such as Repatha or Praluent"  Ryan Morgan is agreeable to adding Crestor 10 mg daily to his Zetia daily to help control/redice his cholesterol levels, along with diet and exercise. Crestor sent in to [place], advise will re-check lipids at next f/u appt. Pt verbalized understanding, all questions or concerns were address and no additional concerns at this time. Agreeable to plan, will call back for anything further.

## 2020-09-29 NOTE — Telephone Encounter (Signed)
Minna Merritts, MD  09/28/2020 5:25 PM EDT      Cholesterol elevated 185 need less than 150 LDL elevated 104 need less than 70 preferably 60 Only on Zetia, stopped Lipitor secondary to muscle ache Would start Crestor 10 mg daily Or could choose the injection medication such as Repatha or Praluent

## 2020-09-29 NOTE — Telephone Encounter (Signed)
Patient returning call.

## 2020-09-29 NOTE — Telephone Encounter (Signed)
Attempted to call the patient. Per his wife, he will need to call us back- she will give him the message.

## 2020-10-05 ENCOUNTER — Telehealth: Payer: Self-pay

## 2020-10-05 NOTE — Chronic Care Management (AMB) (Addendum)
    Chronic Care Management Pharmacy Assistant   Name: Ryan Morgan  MRN: [id number] DOB: Jul 10, 1940  Reason for Encounter: Reminder for CCM Appointment 10/12/20  Medications: Outpatient Encounter Medications as of 10/05/2020  Medication Sig   acarbose (PRECOSE) 25 MG tablet Take 1 tablet (25 mg total) by mouth 3 (three) times daily with meals.   carvedilol (COREG) 6.25 MG tablet TAKE 1 TABLET BY MOUTH TWICE DAILY WITH MEALS   cetirizine (ZYRTEC) 10 MG tablet Take 10 mg by mouth 2 (two) times daily.    cholecalciferol (VITAMIN D3) 25 MCG (1000 UNIT) tablet Take 3,000 Units by mouth daily.   clobetasol cream (TEMOVATE) 4.92 % Apply 1 application topically 2 (two) times daily. To affected areas   clopidogrel (PLAVIX) 75 MG tablet Take 1 tablet by mouth once daily   ENTRESTO 49-51 MG Take 1 tablet by mouth twice daily   ezetimibe (ZETIA) 10 MG tablet Take 1 tablet by mouth once daily   glucose blood (ONETOUCH ULTRA) test strip 1 each by Other route in the morning and at bedtime. And lancets 2/day   LANCETS ULTRA THIN 30G MISC by Does not apply route. One Touch   metFORMIN (GLUCOPHAGE-XR) 500 MG 24 hr tablet Take 1 tablet (500 mg total) by mouth daily with breakfast.   Multiple Vitamin (MULTIVITAMIN WITH MINERALS) TABS tablet Take 1 tablet by mouth daily.   Multiple Vitamins-Minerals (AIRBORNE) TBEF Take 1 tablet by mouth daily as needed (for immune system support). Immune Health/Support   nystatin cream (MYCOSTATIN) Apply 1 application topically 2 (two) times daily as needed for dry skin.   repaglinide (PRANDIN) 2 MG tablet TAKE 1 TABLET BY MOUTH THREE TIMES DAILY BEFORE MEAL(S)   rosuvastatin (CRESTOR) 10 MG tablet Take 1 tablet (10 mg total) by mouth daily.   spironolactone (ALDACTONE) 25 MG tablet Take 1 tablet by mouth once daily   tetrahydrozoline 0.05 % ophthalmic solution Place 1-2 drops into both eyes 3 (three) times daily as needed (for dry/irritated eyes).   Triamcinolone Acetonide  (NASACORT ALLERGY 24HR NA) Place 2 sprays into the nose at bedtime as needed (allergies).    [DISCONTINUED] nebivolol (BYSTOLIC) 5 MG tablet Take 1 tablet (5 mg total) by mouth 2 (two) times daily.   No facility-administered encounter medications on file as of 10/05/2020.   Ryan Morgan was contacted to remind him of his upcoming telephone visit with Debbora Dus on 10/12/20 at 11:00 AM.  He was reminded to have all medications, supplements and any blood glucose and blood pressure readings available for review at appointment.  Are you having any problems with your medications? No   What concerns would you like to discuss with the pharmacist? Denies any concerns at this time.  Star Rating Drugs: Medication:  Last Fill: Day Supply Entresto 49-51 mg 09/23/20 90 Metformin 500 mg 08/31/20  90 Repaglinide 2 mg 09/14/20 90 Rosuvastatin 10 mg No fill history available  Debbora Dus, CPP notified  Margaretmary Dys, [place] [phone]  I have reviewed the care management and care coordination activities outlined in this encounter and I am certifying that I agree with the content of this note. No further action required.  Debbora Dus, PharmD Clinical Pharmacist [facility] at [facility] [phone]

## 2020-10-12 ENCOUNTER — Telehealth: Payer: Medicare HMO

## 2020-10-27 NOTE — Progress Notes (Signed)
Attempted contact with [name] 3 times on 10/27/2020 Unsuccessful outreach. Will attempt contact next month. Debbora Dus, CPP notified  Avel Sensor, [place] Assistant [phone]  Total time spent for month CPA: 20 min.

## 2020-11-17 DIAGNOSIS — I776 Arteritis, unspecified: Secondary | ICD-10-CM | POA: Diagnosis not present

## 2020-11-17 DIAGNOSIS — Z79899 Other long term (current) drug therapy: Secondary | ICD-10-CM | POA: Diagnosis not present

## 2020-11-20 ENCOUNTER — Other Ambulatory Visit: Payer: Self-pay | Admitting: Cardiovascular Disease

## 2020-11-25 ENCOUNTER — Other Ambulatory Visit: Payer: Self-pay

## 2020-11-25 ENCOUNTER — Ambulatory Visit (INDEPENDENT_AMBULATORY_CARE_PROVIDER_SITE_OTHER): Payer: Medicare HMO | Admitting: Endocrinology

## 2020-11-25 VITALS — BP 132/78 | HR 70 | Ht 70.0 in | Wt 226.6 lb

## 2020-11-25 DIAGNOSIS — E11311 Type 2 diabetes mellitus with unspecified diabetic retinopathy with macular edema: Secondary | ICD-10-CM | POA: Diagnosis not present

## 2020-11-25 LAB — POCT GLYCOSYLATED HEMOGLOBIN (HGB A1C): Hemoglobin A1C: 6.5 % — AB (ref 4.0–5.6)

## 2020-11-25 NOTE — Progress Notes (Signed)
Subjective:    Patient ID: Ryan Morgan, male    DOB: 1940/09/26, 80 y.o.   MRN: [id number]  HPI Pt returns for f/u of diabetes mellitus:  DM type: 2 Dx'ed: 6237 Complications: PN, DR, and CAD.   Therapy: 3 oral meds DKA: never Severe hypoglycemia: never.   Pancreatitis: never Pancreatic imaging: never SDOH: he cannot afford brand name meds.  Other: he takes intermittent prednisone for Wegener's granulomatosis.   Interval history: last steroid rx was 3 mos ago.  He brings a record of his cbg's which I have reviewed today.  cbg varies from 89-158.  He takes meds as rx'ed.  pt states he feels well in general. Past Medical History:  Diagnosis Date   Coronary artery disease    a. prior LAD stenting 2000; b. [place] 2003 LAD 40, D1 50; c. Lexiscan 02/2017: large defect of severe severity in the basal inferoseptal, basal inferior, mid inferoseptal, mid inferior and apical inferior location, high risk; d. LHC 03/29/17: LM nl, patent LAD stent w/ 40% ISR, D1 100,  OM1 90, RPDA 100 w/ L-R collats, post atrio 80% s/p PCI/DES     Diabetes mellitus    Hyperlipidemia    Hypertension    Ischemic cardiomyopathy    a. TTE 02/2017: EF 30-35%, diffuse HK, normal LV diastolic function parameters, mild AI/MR, RV systolic function normal, PASP normal   Lung nodule    a. s/p prior thoracotomy   Morbid obesity ([place])    Wegener's granulomatosis 2007    Past Surgical History:  Procedure Laterality Date   BASAL CELL CARCINOMA EXCISION     removed from face x 3   [place]   stent in heart   CORONARY ANGIOPLASTY  09/22/1998   s/p stent placement; [facility] 3.0 x 18 mm Ref [id number]   CORONARY STENT INTERVENTION N/A 03/29/2017   Procedure: CORONARY STENT INTERVENTION;  Surgeon: Wellington Hampshire, MD;  Location: [facility];  Service: Cardiovascular;  Laterality: N/A;   LEFT HEART CATH AND CORONARY ANGIOGRAPHY N/A 03/29/2017   Procedure: LEFT HEART CATH AND  CORONARY ANGIOGRAPHY;  Surgeon: Wellington Hampshire, MD;  Location: [facility];  Service: Cardiovascular;  Laterality: N/A;   LUNG SURGERY     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   ULTRASOUND GUIDANCE FOR VASCULAR ACCESS  03/29/2017   Procedure: Ultrasound Guidance For Vascular Access;  Surgeon: Wellington Hampshire, MD;  Location: [facility];  Service: Cardiovascular;;    Social History   Socioeconomic History   Marital status: Married    Spouse name: Not on file   Number of children: Not on file   Years of education: Not on file   Highest education level: Not on file  Occupational History   Not on file  Tobacco Use   Smoking status: Former    Packs/day: 0.50    Years: 20.00    Pack years: 10.00    Types: Cigarettes    Quit date: 05/30/1978    Years since quitting: 42.5   Smokeless tobacco: Never  Vaping Use   Vaping Use: Never used  Substance and Sexual Activity   Alcohol use: No    Alcohol/week: 0.0 standard drinks   Drug use: No   Sexual activity: Not Currently  Other Topics Concern   Not on file  Social History Narrative   Regular exercise: yes   Caffeine use: very little   Social Determinants of  Health   Financial Resource Strain: Low Risk    Difficulty of Paying Living Expenses: Not hard at all  Food Insecurity: No Food Insecurity   Worried About [place] in the Last Year: Never true   Ran Out of Food in the Last Year: Never true  Transportation Needs: No Transportation Needs   Lack of Transportation (Medical): No   Lack of Transportation (Non-Medical): No  Physical Activity: Inactive   Days of Exercise per Week: 0 days   Minutes of Exercise per Session: 0 min  Stress: No Stress Concern Present   Feeling of Stress : Not at all  Social Connections: Not on file  Intimate Partner Violence: Not At Risk   Fear of Current or Ex-Partner: No   Emotionally Abused: No   Physically Abused: No   Sexually Abused: No    Current  Outpatient Medications on File Prior to Visit  Medication Sig Dispense Refill   acarbose (PRECOSE) 25 MG tablet Take 1 tablet (25 mg total) by mouth 3 (three) times daily with meals. 270 tablet 3   carvedilol (COREG) 6.25 MG tablet TAKE 1 TABLET BY MOUTH TWICE DAILY WITH MEALS 180 tablet 0   cetirizine (ZYRTEC) 10 MG tablet Take 10 mg by mouth 2 (two) times daily.      cholecalciferol (VITAMIN D3) 25 MCG (1000 UNIT) tablet Take 3,000 Units by mouth daily.     clobetasol cream (TEMOVATE) 7.59 % Apply 1 application topically 2 (two) times daily. To affected areas 30 g 1   clopidogrel (PLAVIX) 75 MG tablet Take 1 tablet by mouth once daily 90 tablet 0   ENTRESTO 49-51 MG Take 1 tablet by mouth twice daily 180 tablet 2   ezetimibe (ZETIA) 10 MG tablet Take 1 tablet by mouth once daily 90 tablet 0   glucose blood (ONETOUCH ULTRA) test strip 1 each by Other route in the morning and at bedtime. And lancets 2/day 200 each 3   LANCETS ULTRA THIN 30G MISC by Does not apply route. One Touch     metFORMIN (GLUCOPHAGE-XR) 500 MG 24 hr tablet Take 1 tablet (500 mg total) by mouth daily with breakfast. 90 tablet 3   Multiple Vitamin (MULTIVITAMIN WITH MINERALS) TABS tablet Take 1 tablet by mouth daily.     Multiple Vitamins-Minerals (AIRBORNE) TBEF Take 1 tablet by mouth daily as needed (for immune system support). Immune Health/Support     nystatin cream (MYCOSTATIN) Apply 1 application topically 2 (two) times daily as needed for dry skin.     repaglinide (PRANDIN) 2 MG tablet TAKE 1 TABLET BY MOUTH THREE TIMES DAILY BEFORE MEAL(S) 270 tablet 0   rosuvastatin (CRESTOR) 10 MG tablet Take 1 tablet (10 mg total) by mouth daily. 90 tablet 3   spironolactone (ALDACTONE) 25 MG tablet Take 1 tablet by mouth once daily 90 tablet 0   tetrahydrozoline 0.05 % ophthalmic solution Place 1-2 drops into both eyes 3 (three) times daily as needed (for dry/irritated eyes).     Triamcinolone Acetonide (NASACORT ALLERGY 24HR NA)  Place 2 sprays into the nose at bedtime as needed (allergies).      [DISCONTINUED] nebivolol (BYSTOLIC) 5 MG tablet Take 1 tablet (5 mg total) by mouth 2 (two) times daily. 180 tablet 3   No current facility-administered medications on file prior to visit.    Allergies  Allergen Reactions   Metformin And Related Itching   Benicar Hct [Olmesartan Medoxomil-Hctz] Itching    Painful and itchy knots on  palms of hands   Bystolic [Nebivolol Hcl] Other (See Comments)    headache   Dapsone Hives   Lovaza [Omega-3-Acid Ethyl Esters] Other (See Comments)    Unknown reaction   Niaspan [Niacin Er] Other (See Comments)    flushing   Peanut-Containing Drug Products     Rash on hands from peanuts   Septra [Bactrim] Hives    Family History  Problem Relation Age of Onset   Cancer Mother        tumor on tonsil   Stroke Maternal Grandfather    Diabetes Neg Hx     BP 132/78   Pulse 70   Ht 5\' 10"  (1.778 m)   Wt 226 lb 9.6 oz (102.8 kg)   SpO2 97%   BMI 32.51 kg/m    Review of Systems He denies hypoglycemia    Objective:   Physical Exam Pulses: dorsalis pedis intact bilat.   MSK: no deformity of the feet CV: 1+ bilat leg edema Skin:  no ulcer on the feet, but the skin is dry and scaly.  normal color and temp on the feet.  There is spotty hyperpigmentation on the legs and feet.   Neuro: sensation is intact to touch on the feet.  Ext: there is bilateral onychomycosis of the toenails.    Lab Results  Component Value Date   HGBA1C 6.5 (A) 11/25/2020       Assessment & Plan:  Type 2 DM: well-controlled  Patient Instructions  Please continue the same diabetes medications.   check your blood sugar once a day.  vary the time of day when you check, between before the 3 meals, and at bedtime.  also check if you have symptoms of your blood sugar being too high or too low.  please keep a record of the readings and bring it to your next appointment here (or you can bring the meter  itself).  You can write it on any piece of paper.  please call us sooner if your blood sugar goes below 70, or if you have a lot of readings over 200.   Please come back for a follow-up appointment in 7 months.

## 2020-11-25 NOTE — Patient Instructions (Addendum)
Please continue the same diabetes medications.   check your blood sugar once a day.  vary the time of day when you check, between before the 3 meals, and at bedtime.  also check if you have symptoms of your blood sugar being too high or too low.  please keep a record of the readings and bring it to your next appointment here (or you can bring the meter itself).  You can write it on any piece of paper.  please call us sooner if your blood sugar goes below 70, or if you have a lot of readings over 200.   Please come back for a follow-up appointment in 7 months.

## 2020-11-27 ENCOUNTER — Other Ambulatory Visit: Payer: Self-pay | Admitting: Endocrinology

## 2020-11-27 ENCOUNTER — Other Ambulatory Visit: Payer: Self-pay | Admitting: Cardiovascular Disease

## 2020-12-01 ENCOUNTER — Telehealth: Payer: Self-pay

## 2020-12-01 NOTE — Chronic Care Management (AMB) (Addendum)
Chronic Care Management Pharmacy Assistant   Name: Ryan Morgan  MRN: [id number] DOB: 1940-08-25   Reason for Encounter: Disease State HTN    Recent office visits:  None since last CCM contact  Recent consult visits:  11/25/20 - Endocrinology - No medication changes 11/17/20 - Nephrology - No medication changes 09/29/20 - Cardiology, telephone call - Medication change stop atorvastatin and change to Rosuvastatin 10mg  take 1 tablet daily due to muscle aches. 09/23/20 - Cardiology - Recommend he discuss Wilder Glade and Jardiance with endocrinology. Stop taking aspirin.  Labs ordered  Hospital visits:  None in previous 6 months  Medications: Outpatient Encounter Medications as of 12/01/2020  Medication Sig   acarbose (PRECOSE) 25 MG tablet TAKE 1 TABLET BY MOUTH THREE TIMES DAILY WITH MEALS   carvedilol (COREG) 6.25 MG tablet TAKE 1 TABLET BY MOUTH TWICE DAILY WITH MEALS   cetirizine (ZYRTEC) 10 MG tablet Take 10 mg by mouth 2 (two) times daily.    cholecalciferol (VITAMIN D3) 25 MCG (1000 UNIT) tablet Take 3,000 Units by mouth daily.   clobetasol cream (TEMOVATE) 2.45 % Apply 1 application topically 2 (two) times daily. To affected areas   clopidogrel (PLAVIX) 75 MG tablet Take 1 tablet by mouth once daily   ENTRESTO 49-51 MG Take 1 tablet by mouth twice daily   ezetimibe (ZETIA) 10 MG tablet Take 1 tablet by mouth once daily   glucose blood (ONETOUCH ULTRA) test strip 1 each by Other route in the morning and at bedtime. And lancets 2/day   LANCETS ULTRA THIN 30G MISC by Does not apply route. One Touch   metFORMIN (GLUCOPHAGE-XR) 500 MG 24 hr tablet Take 1 tablet by mouth once daily with breakfast   Multiple Vitamin (MULTIVITAMIN WITH MINERALS) TABS tablet Take 1 tablet by mouth daily.   Multiple Vitamins-Minerals (AIRBORNE) TBEF Take 1 tablet by mouth daily as needed (for immune system support). Immune Health/Support   nystatin cream (MYCOSTATIN) Apply 1 application topically 2 (two)  times daily as needed for dry skin.   repaglinide (PRANDIN) 2 MG tablet TAKE 1 TABLET BY MOUTH THREE TIMES DAILY BEFORE MEAL(S)   rosuvastatin (CRESTOR) 10 MG tablet Take 1 tablet (10 mg total) by mouth daily.   spironolactone (ALDACTONE) 25 MG tablet Take 1 tablet by mouth once daily   tetrahydrozoline 0.05 % ophthalmic solution Place 1-2 drops into both eyes 3 (three) times daily as needed (for dry/irritated eyes).   Triamcinolone Acetonide (NASACORT ALLERGY 24HR NA) Place 2 sprays into the nose at bedtime as needed (allergies).    [DISCONTINUED] nebivolol (BYSTOLIC) 5 MG tablet Take 1 tablet (5 mg total) by mouth 2 (two) times daily.   No facility-administered encounter medications on file as of 12/01/2020.    Recent Office Vitals: BP Readings from Last 3 Encounters:  11/25/20 132/78  09/23/20 128/80  06/22/20 (!) 142/84   Pulse Readings from Last 3 Encounters:  11/25/20 70  09/23/20 68  06/22/20 70    Wt Readings from Last 3 Encounters:  11/25/20 226 lb 9.6 oz (102.8 kg)  09/23/20 232 lb 6 oz (105.4 kg)  06/22/20 230 lb (104.3 kg)     Kidney Function Lab Results  Component Value Date/Time   CREATININE 0.90 12/03/2019 09:39 AM   CREATININE 0.95 09/12/2019 09:28 AM   CREATININE 0.89 12/26/2012 11:06 AM   GFR 81.75 12/03/2019 09:39 AM   GFRNONAA >60 09/12/2019 09:28 AM   GFRAA >60 09/12/2019 09:28 AM    BMP Latest Ref  Rng & Units 12/03/2019 09/12/2019 11/26/2018  Glucose 70 - 99 mg/dL 171(H) 188(H) 156(H)  BUN 6 - 23 mg/dL 17 7(L) 9  Creatinine 0.40 - 1.50 mg/dL 0.90 0.95 0.84  BUN/Creat Ratio 10 - 24 - - -  Sodium 135 - 145 mEq/L 140 140 141  Potassium 3.5 - 5.1 mEq/L 4.3 4.5 4.1  Chloride 96 - 112 mEq/L 104 103 104  CO2 19 - 32 mEq/L 29 27 29   Calcium 8.4 - 10.5 mg/dL 8.7 9.0 8.5   Current antihypertensive regimen:   Spironolactone 25 mg - 1 tablet daily       Carvedilol 6.25 mg - 1 tablet 2 times daily   Entresto 49-51mg  1 tablet 2 times daily    Patient verbally  confirms he is taking the above medications as directed. Yes the patient states this how he take his BP medication   How often are you checking your Blood Pressure? 1-2x per week  Current home BP readings:  DATE:             BP               PULSE 11/27/20  127/73  67 11/28/20  121/74  66 11/30/20  128/77  67   Wrist or arm cuff: arm  Caffeine intake: no caffeine use Salt intake: limits adding to foods OTC medications including pseudoephedrine or NSAIDs? no  Any readings above 180/120? No  What recent interventions/DTPs have been made by any provider to improve Blood Pressure control since last CPP Visit: none identified the patient reports he feels well with his current readings   Any recent hospitalizations or ED visits since last visit with CPP? No  What diet changes have been made to improve Blood Pressure Control? None identified   What exercise is being done to improve your Blood Pressure Control? The patient reports he is active with gardening and mowing grass and has a motor home that he is cleaning for a trip [place] the [place] in a few days    Adherence Review: Is the patient currently on ACE/ARB medication? Yes Does the patient have >5 day gap between last estimated fill dates? CPP to review   Star Rating Drugs: Medication:  Last Fill: Day Supply    Metformin XR 500 mg.11/30/2020         90ds Prandin 2 mg.              09/14/2020        90ds Entresto 49-51mg .      09/23/2020        90ds Rosuvastatin 10mg  09/29/2020 90ds   The patient will follow up Debbora Dus CPP for telephone follow-up on 12/15/20 at 1:15pm. The patient reports the rosuvastatin also is causing his legs to ache and cramp as well.  Follow-Up:  Pharmacist Review  Debbora Dus, CPP notified  Avel Sensor, [place] Assistant [phone]   I have reviewed the care management and care coordination activities outlined in this encounter and I am certifying that I agree with the content  of this note. Will discuss at f/u visit as scheduled.  Debbora Dus, PharmD Clinical Pharmacist [facility] at [facility] [phone]

## 2020-12-03 ENCOUNTER — Ambulatory Visit: Payer: Medicare HMO

## 2020-12-15 ENCOUNTER — Ambulatory Visit: Payer: Medicare HMO

## 2020-12-15 ENCOUNTER — Telehealth: Payer: Medicare HMO

## 2020-12-28 ENCOUNTER — Ambulatory Visit (INDEPENDENT_AMBULATORY_CARE_PROVIDER_SITE_OTHER): Payer: Medicare HMO

## 2020-12-28 ENCOUNTER — Other Ambulatory Visit: Payer: Self-pay

## 2020-12-28 ENCOUNTER — Telehealth: Payer: Self-pay

## 2020-12-28 DIAGNOSIS — E785 Hyperlipidemia, unspecified: Secondary | ICD-10-CM

## 2020-12-28 DIAGNOSIS — E1169 Type 2 diabetes mellitus with other specified complication: Secondary | ICD-10-CM

## 2020-12-28 DIAGNOSIS — I10 Essential (primary) hypertension: Secondary | ICD-10-CM | POA: Diagnosis not present

## 2020-12-28 DIAGNOSIS — E11311 Type 2 diabetes mellitus with unspecified diabetic retinopathy with macular edema: Secondary | ICD-10-CM | POA: Diagnosis not present

## 2020-12-28 NOTE — Patient Instructions (Signed)
Dear Ryan Morgan,  Below is a summary of the goals we discussed during our follow up appointment on December 28, 2020. Please contact me anytime with questions or concerns.   Visit Information  Patient Care Plan: CCM Pharmacy Care Plan     Problem Identified: CHL AMB "PATIENT-SPECIFIC PROBLEM"      Long-Range Goal: Disease Management   Start Date: 12/28/2020  Priority: High  Note:   Current Barriers:  Unable to independently afford treatment regimen - Entresto  Pharmacist Clinical Goal(s):  Patient will verbalize ability to afford treatment regimen through collaboration with PharmD and provider.   Interventions: 1:1 collaboration with Ryan Morgan, Wynelle Fanny, MD regarding development and update of comprehensive plan of care as evidenced by provider attestation and co-signature Inter-disciplinary care team collaboration (see longitudinal plan of care) Comprehensive medication review performed; medication list updated in electronic medical record  Diabetes (A1c goal <7%) -Controlled - A1c 6.5% (10/2020) -Followed by endo, next f/u 7 months from June 2022 -Current medications: Repaglinide 2 mg - 1 tablet TID before meals Acarbose 25 mg - 1/2 tablet TID with meals Metformin 500 mg ER - 1 tablet daily -Medications previously tried: none, Jardiance - cost, [place] - cost, Januvia - cost -Current home glucose readings - checking routinely, did not review logs today -Denies hypoglycemic/hyperglycemic symptoms -Educated on Assistance programs available for Ryan Morgan which would benefit his CHF and CAD in addition to diabetes. Will revisit this if he is approved for Entresto. -Counseled to check feet daily and get yearly eye exams -Recommended to continue current medication; Revisit SGLT-2 inhibitor at next visit.  Hyperlipidemia: (LDL goal < 70) -Not ideally controlled - No longer on statin, stopped due to muscle aches 10/2020 -Current treatment: Rosuvastatin 10 mg - 1 tablet daily - start  09/29/20 - stopped 30 days later Zetia 10 mg - 1 tablet daily -Medications previously tried: Atorvastatin, muscle aches -Educated on Cholesterol goals;  Exercise goal of 150 minutes per week; -Will consult PCP/cardio - consider lipid clinic referral for [employer]-9i   Heart Failure (Goal: manage symptoms and prevent exacerbations) -Controlled -Last ejection fraction: 30-35% (Date: April 2019) -HF type: Systolic -NYHA Class: I (no actitivty limitation) -AHA HF Stage: C (Heart disease and symptoms present) -Current treatment: Spironolactone 25 mg - 1 tablet daily Entresto 49-51 mg - 1 tablet BID Carvedilol 6.25 mg - 1 tablet BID -Medications previously tried: none  -Current home BP/HR readings:  124/76, 112/81, 126/78 (July 2022) DATE:              BP               PULSE 11/27/20              127/73             67 11/28/20              121/74             66 11/30/20              128/77             67 Wrist or arm cuff: arm  Caffeine intake: no caffeine use Salt intake: limits adding to foods OTC medications including pseudoephedrine or NSAIDs? no Oxygen 98% daily No SOB, no swelling  -Current exercise habits: activities include gardening, mowing grass -Educated on Importance of weighing daily; if you gain more than 3 pounds in one day or 5 pounds in one week, call office -Recommended  to continue current medication; Apply for Entresto PAP.  Patient Goals/Self-Care Activities Patient will:  - collaborate with provider on medication access solutions  Follow Up Plan: Telephone follow up appointment with care management team member scheduled for:  1 month CMA call for Entresto PAP update      Patient verbalizes understanding of instructions provided today and agrees to view in [place].   Debbora Dus, PharmD Clinical Pharmacist [facility] at [facility] [phone]

## 2020-12-28 NOTE — Telephone Encounter (Signed)
Patient reports stopped rosuvastatin in June. He was unable to tolerate due to severe muscle pain (unable to sleep). Also did not tolerate atorvastatin in the past. He continues Zetia daily. Updating cardiology.   Debbora Dus, PharmD Clinical Pharmacist [facility] at [facility] [phone]

## 2020-12-28 NOTE — Progress Notes (Signed)
 Chronic Care Management Pharmacy Note  12/28/2020 Name:  Ryan Morgan MRN:  [id number] DOB:  10/04/1940  Summary: --HLD, CAD - Patient stopped rosuvastatin June 2022 due to severe muscle pain. This has mostly resolved off statin.  He continues Zetia monotherapy. No adherence concerns otherwise.  --CHF - He is having difficulty affording Entresto. No symptoms reported, weight stable. Followed by cardiology.  --DM - Followed by endo. Controlled, A1c 6.5% - cost has prohibited SGLT-2 in the past. Not on meds with CAD/CHF benefit. I believe he may be eligible for Farxiga assistance program.  Recommendations/Changes made from today's visit: Will start Entresto assistance program. Consider Farxiga PAP if patient interested at next visit. Update cardiology regarding statin intolerance.   Plan: CCM Team follow up 1 month for Entresto PAP status  Subjective: Ryan Morgan is an 80 y.o. year old male who is a primary patient of Tower, Marne A, MD.  The CCM team was consulted for assistance with disease management and care coordination needs.    Engaged with patient by telephone for follow up visit in response to provider referral for pharmacy case management and/or care coordination services.   Consent to Services:  The patient was given information about Chronic Care Management services, agreed to services, and gave verbal consent prior to initiation of services.  Please see initial visit note for detailed documentation.   Patient Care Team: Tower, Marne A, MD as PCP - General (Family Medicine) Gollan, Timothy J, MD as Consulting Physician (Cardiology) Isenstein, Arin L, MD as Consulting Physician (Dermatology) McQuaid, Douglas B, MD as Consulting Physician (Pulmonary Disease) Adams, Michelle, RPH as Pharmacist (Pharmacist)  Recent office visits:  None since last CCM contact   Recent consult visits:  11/25/20 - Endocrinology - Pt presented for DM follow up. No medication changes. Follow up  7 months. 11/17/20 - Nephrology - Pt presented for ANCA vasculitis. No medication changes. Follow up in 1 year. 09/29/20 - Cardiology, telephone call - Medication change stop atorvastatin and change to Rosuvastatin 10mg take 1 tablet daily due to muscle aches. 09/23/20 - Cardiology - Recommend he discuss Farxiga and Jardiance with endocrinology. Stop taking aspirin.  Labs ordered   Hospital visits:  None in previous 6 months   Objective:  Lab Results  Component Value Date   CREATININE 0.90 12/03/2019   BUN 17 12/03/2019   GFR 81.75 12/03/2019   GFRNONAA >60 09/12/2019   GFRAA >60 09/12/2019   NA 140 12/03/2019   K 4.3 12/03/2019   CALCIUM 8.7 12/03/2019   CO2 29 12/03/2019   GLUCOSE 171 (H) 12/03/2019    Lab Results  Component Value Date/Time   HGBA1C 6.5 (A) 11/25/2020 09:40 AM   HGBA1C 7.0 (A) 06/22/2020 09:48 AM   HGBA1C 8.4 (H) 11/26/2018 11:27 AM   HGBA1C 7.6 (H) 08/24/2018 09:25 PM   FRUCTOSAMINE 333 (H) 06/15/2017 03:51 PM   GFR 81.75 12/03/2019 09:39 AM   GFR 88.77 11/26/2018 11:27 AM   MICROALBUR 1.8 11/26/2018 11:27 AM   MICROALBUR <0.7 11/20/2017 11:26 AM    Last diabetic Eye exam:  Lab Results  Component Value Date/Time   HMDIABEYEEXA No Retinopathy 03/27/2020 12:00 AM    Last diabetic Foot exam: Followed by endocrinology   Lab Results  Component Value Date   CHOL 185 09/23/2020   HDL 34 (L) 09/23/2020   LDLCALC 104 (H) 09/23/2020   LDLDIRECT 100.0 05/17/2017   TRIG 274 (H) 09/23/2020   CHOLHDL 5.4 (H) 09/23/2020    Hepatic   Function Latest Ref Rng & Units 12/03/2019 11/26/2018 11/20/2017  Total Protein 6.0 - 8.3 g/dL 5.8(L) 5.9(L) 6.1  Albumin 3.5 - 5.2 g/dL 3.9 3.9 4.0  AST 0 - 37 U/L _0 ALT 0 - 53 U/L _1 Alk Phosphatase 39 - 117 U/L 62 71 64  Total Bilirubin 0.2 - 1.2 mg/dL 0.6 0.5 0.5    Lab Results  Component Value Date/Time   TSH 1.30 12/03/2019 09:39 AM   TSH 1.115 08/24/2018 03:28 PM   TSH 0.91 11/20/2017 11:26 AM    CBC  Latest Ref Rng & Units 12/03/2019 09/12/2019 11/26/2018  WBC 4.0 - 10.5 K/uL 3.5(L) 4.6 3.5(L)  Hemoglobin 13.0 - 17.0 g/dL 12.3(L) 12.4(L) 13.1  Hematocrit 39.0 - 52.0 % 35.2(L) 36.5(L) 38.3(L)  Platelets 150.0 - 400.0 K/uL 151.0 159 175.0    Lab Results  Component Value Date/Time   VD25OH 27.61 (L) 06/02/2014 08:44 AM   Clinical ASCVD: Yes  The 10-year ASCVD risk score Mikey Bussing DC Jr., et al., 2013) is: 59.8%   Values used to calculate the score:     Age: 50 years     Sex: Male     Is Non-Hispanic African American: No     Diabetic: Yes     Tobacco smoker: No     Systolic Blood Pressure: 283 mmHg     Is BP treated: Yes     HDL Cholesterol: 34 mg/dL     Total Cholesterol: 185 mg/dL    Depression screen [facility] 2/9 12/03/2019 11/23/2018 11/20/2017  Decreased Interest 0 0 0  Down, Depressed, Hopeless 0 0 0  PHQ - 2 Score 0 0 0  Altered sleeping 0 0 0  Tired, decreased energy 0 0 0  Change in appetite 0 0 0  Feeling bad or failure about yourself  0 0 0  Trouble concentrating 0 0 0  Moving slowly or fidgety/restless 0 0 0  Suicidal thoughts 0 0 0  PHQ-9 Score 0 0 0  Difficult doing work/chores Not difficult at all Not difficult at all Not difficult at all  Some recent data might be hidden    Social History   Tobacco Use  Smoking Status Former   Packs/day: 0.50   Years: 20.00   Pack years: 10.00   Types: Cigarettes   Quit date: 05/30/1978   Years since quitting: 42.6  Smokeless Tobacco Never   BP Readings from Last 3 Encounters:  11/25/20 132/78  09/23/20 128/80  06/22/20 (!) 142/84   Pulse Readings from Last 3 Encounters:  11/25/20 70  09/23/20 68  06/22/20 70   Wt Readings from Last 3 Encounters:  11/25/20 226 lb 9.6 oz (102.8 kg)  09/23/20 232 lb 6 oz (105.4 kg)  06/22/20 230 lb (104.3 kg)   BMI Readings from Last 3 Encounters:  11/25/20 32.51 kg/m  09/23/20 32.87 kg/m  06/22/20 32.54 kg/m    Assessment/Interventions: Review of patient past medical history,  allergies, medications, health status, including review of consultants reports, laboratory and other test data, was performed as part of comprehensive evaluation and provision of chronic care management services.   SDOH:  (Social Determinants of Health) assessments and interventions performed: Yes SDOH Interventions    Flowsheet Row Most Recent Value  SDOH Interventions   Financial Strain Interventions Other (Comment)  [Entresto assistance]      SDOH Screenings   Alcohol Screen: Not on file  Depression (TDV7-6): Not on file  Financial Resource Strain: Medium Risk  Difficulty of Paying Living Expenses: Somewhat hard  Food Insecurity: Not on file  Housing: Not on file  Physical Activity: Not on file  Social Connections: Not on file  Stress: Not on file  Tobacco Use: Medium Risk   Smoking Tobacco Use: Former   Smokeless Tobacco Use: Never  Transportation Needs: Not on file    CCM Care Plan  Allergies  Allergen Reactions   Metformin And Related Itching   Benicar Hct [Olmesartan Medoxomil-Hctz] Itching    Painful and itchy knots on palms of hands   Bystolic [Nebivolol Hcl] Other (See Comments)    headache   Dapsone Hives   Lovaza [Omega-3-Acid Ethyl Esters] Other (See Comments)    Unknown reaction   Niaspan [Niacin Er] Other (See Comments)    flushing   Peanut-Containing Drug Products     Rash on hands from peanuts   Septra [Bactrim] Hives    Medications Reviewed Today     Reviewed by Adams, Michelle, RPH (Pharmacist) on 12/28/20 at 1134  Med List Status: <None>   Medication Order Taking? Sig Documenting Provider Last Dose Status Informant  acarbose (PRECOSE) 25 MG tablet [id number] Yes TAKE 1 TABLET BY MOUTH THREE TIMES DAILY WITH MEALS Ellison, Sean, MD Taking Active   carvedilol (COREG) 6.25 MG tablet [id number]  TAKE 1 TABLET BY MOUTH TWICE DAILY WITH MEALS Gollan, Timothy J, MD  Active   cetirizine (ZYRTEC) 10 MG tablet [id number]  Take 10 mg by mouth 2 (two) times  daily.  [provider]  Active Multiple Informants  cholecalciferol (VITAMIN D3) 25 MCG (1000 UNIT) tablet [id number] Yes Take 3,000 Units by mouth daily. [provider] Taking Active Multiple Informants  clobetasol cream (TEMOVATE) 0.05 % [id number]  Apply 1 application topically 2 (two) times daily. To affected areas Gutierrez, Javier, MD  Active Multiple Informants  clopidogrel (PLAVIX) 75 MG tablet [id number]  Take 1 tablet by mouth once daily Gollan, Timothy J, MD  Active   ENTRESTO 49-51 MG [id number]  Take 1 tablet by mouth twice daily Gollan, Timothy J, MD  Active   ezetimibe (ZETIA) 10 MG tablet [id number] Yes Take 1 tablet by mouth once daily Gollan, Timothy J, MD Taking Active   glucose blood (ONETOUCH ULTRA) test strip [id number]  1 each by Other route in the morning and at bedtime. And lancets 2/day Ellison, Sean, MD  Active   LANCETS ULTRA THIN 30G MISC [id number]  by Does not apply route. One Touch [provider]  Active Multiple Informants  metFORMIN (GLUCOPHAGE-XR) 500 MG 24 hr tablet [id number]  Take 1 tablet by mouth once daily with breakfast Ellison, Sean, MD  Active   Multiple Vitamin (MULTIVITAMIN WITH MINERALS) TABS tablet [id number]  Take 1 tablet by mouth daily. [provider]  Active Multiple Informants  Multiple Vitamins-Minerals (AIRBORNE) TBEF [id number]  Take 1 tablet by mouth daily as needed (for immune system support). Immune Health/Support [provider]  Active Multiple Informants    Discontinued 08/16/11 1044 (Side effect (s))            Med Note (CRESPO, SHARON G   Wed Sep 23, 2020  8:20 AM)    nystatin cream (MYCOSTATIN) [id number]  Apply 1 application topically 2 (two) times daily as needed for dry skin. [provider]  Active Multiple Informants  repaglinide (PRANDIN) 2 MG tablet [id number]  TAKE 1 TABLET BY MOUTH THREE TIMES DAILY BEFORE MEAL(S) Ellison, Sean, MD  Active   rosuvastatin (CRESTOR) 10 MG tablet  [id number]   No Take 1 tablet (10 mg total) by mouth daily.  Patient not taking: Reported on 12/28/2020   Minna Merritts, MD Not Taking Consider Medication Status and Discontinue   spironolactone (ALDACTONE) 25 MG tablet [id number]  Take 1 tablet by mouth once daily Minna Merritts, MD  Active   tetrahydrozoline 0.05 % ophthalmic solution [id number]  Place 1-2 drops into both eyes 3 (three) times daily as needed (for dry/irritated eyes). [provider]  Active Multiple Informants  Triamcinolone Acetonide (NASACORT ALLERGY 24HR NA) [id number]  Place 2 sprays into the nose at bedtime as needed (allergies).  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   Right leg pain 12/17/2018   Ischemic cardiomyopathy 09/10/2018   Hip joint effusion, left 05/03/2018   Laceration of arm 12/04/2017   Current chronic use of systemic steroids 11/15/2016   Routine general medical examination at a health care facility 11/09/2015   Hearing loss 11/09/2015   History of colonic polyps 11/09/2015   Rash of hands 03/10/2015   Colon cancer screening 06/09/2014   Blepharitis, bilateral 06/02/2014   Benign paroxysmal positional vertigo 12/14/2013   Bursitis of right hip 10/17/2013   Coronary artery disease of native artery of native heart with stable angina pectoris ([place]) 03/19/2013   Encounter for Medicare annual wellness exam 03/05/2013   Adverse effect of glucocorticoid or synthetic analogue 03/05/2013   Osteopenia 03/05/2013   Class 1 obesity with serious comorbidity and body mass index (BMI) of 33.0 to 33.9 in adult 07/15/2011   Prostate cancer screening 07/10/2011   Hypertension 08/30/2010   Hyperlipidemia associated with type 2 diabetes mellitus ([place]) 08/30/2010   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/30/2010   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    Benign essential hypertension 02/13/2008   Pure hypercholesterolemia 02/13/2008    Immunization History  Administered Date(s) Administered   Fluad Quad(high Dose 65+) 03/13/2019   Influenza Split 05/16/2011, 02/16/2012   Influenza, High Dose Seasonal PF 02/28/2017   Influenza,inj,Quad PF,6+ Mos 03/05/2013, 03/10/2014, 05/20/2015, 03/30/2016, 05/10/2018   PFIZER(Purple Top)SARS-COV-2 Vaccination 07/12/2019, 08/02/2019, 01/17/2020   Pneumococcal Conjugate-13 06/09/2014   Pneumococcal Polysaccharide-23 07/15/2011   Td 11/29/2017    Conditions to be addressed/monitored:  Hypertension, Diabetes, and Heart Failure  Care Plan : [place]  Updates made by Debbora Dus, [place] since 12/28/2020 12:00 AM     Problem: CHL AMB "PATIENT-SPECIFIC PROBLEM"      Long-Range Goal: Disease Management   Start Date: 12/28/2020  Priority: High  Note:   Current Barriers:  Unable to independently afford treatment regimen - Entresto  Pharmacist Clinical Goal(s):  Patient will verbalize ability to afford treatment regimen through collaboration with PharmD and provider.   Interventions: 1:1 collaboration with Tower, Wynelle Fanny, MD regarding development and update of comprehensive plan of care as evidenced by provider attestation and co-signature Inter-disciplinary care team collaboration (see longitudinal plan of care) Comprehensive medication review performed; medication list updated in electronic medical record  Diabetes (A1c goal <7%) -Controlled - A1c 6.5% (10/2020) -Followed by endo, next f/u 7 months from June 2022 -Current medications: Repaglinide 2 mg - 1 tablet TID before meals Acarbose 25 mg - 1/2 tablet TID with meals Metformin 500 mg ER - 1 tablet daily -Medications previously tried: none, Jardiance - cost, [place] - cost, Januvia - cost -Current home glucose readings -  checking routinely, did not review logs today -Denies hypoglycemic/hyperglycemic symptoms -Educated on  Assistance programs available for [place] which would benefit his CHF and CAD in addition to diabetes. Will revisit this if he is approved for Entresto. -Counseled to check feet daily and get yearly eye exams -Recommended to continue current medication; Revisit SGLT-2 inhibitor at next visit.  Hyperlipidemia: (LDL goal < 70) -Not ideally controlled - No longer on statin, stopped due to muscle aches 10/2020 -Current treatment: Rosuvastatin 10 mg - 1 tablet daily - start 09/29/20 - stopped 30 days later Zetia 10 mg - 1 tablet daily -Medications previously tried: Atorvastatin, muscle aches -Educated on Cholesterol goals;  Exercise goal of 150 minutes per week; -Will consult PCP/cardio - consider lipid clinic referral for [employer]-9i   Heart Failure (Goal: manage symptoms and prevent exacerbations) -Controlled -Last ejection fraction: 30-35% (Date: April 2019) -HF type: Systolic -NYHA Class: I (no actitivty limitation) -AHA HF Stage: C (Heart disease and symptoms present) -Current treatment: Spironolactone 25 mg - 1 tablet daily Entresto 49-51 mg - 1 tablet BID Carvedilol 6.25 mg - 1 tablet BID -Medications previously tried: none  -Current home BP/HR readings:  124/76, 112/81, 126/78 (July 2022) DATE:              BP               PULSE 11/27/20              127/73             67 11/28/20              121/74             66 11/30/20              128/77             67 Wrist or arm cuff: arm  Caffeine intake: no caffeine use Salt intake: limits adding to foods OTC medications including pseudoephedrine or NSAIDs? no Oxygen 98% daily No SOB, no swelling  -Current exercise habits: activities include gardening, mowing grass -Educated on Importance of weighing daily; if you gain more than 3 pounds in one day or 5 pounds in one week, call office -Recommended to continue current medication; Apply for Entresto PAP.  Patient Goals/Self-Care Activities Patient will:  - collaborate with provider on  medication access solutions  Follow Up Plan: Telephone follow up appointment with care management team member scheduled for:  1 month CMA call for Entresto PAP update    Medication Assistance:  Patient needs assistance with Delene Loll cost. Will start paperwork. He will come into office tomorrow to sign.  Compliance/Adherence/Medication fill history: Care Gaps: Shingrix, COVID-19 booster, Influenza vaccine   Star-Rating Drugs: Medication:                Last Fill:         Day Supply    Metformin XR 500 mg.11/30/2020         90ds Prandin 2 mg.              12/11/2020        90ds Entresto 49-14m.      12/21/2020        90ds Rosuvastatin 150m    09/29/2020          90ds - not taking   [facility]one: [phone]  Fax: [phone]  Uses pill box? Yes Pt endorses 100% compliance   Care Plan and Follow Up Patient Decision:  Patient agrees to Care Plan and Follow-up.  Michelle Adams, PharmD Clinical Pharmacist [facility] [place] [phone]      

## 2020-12-28 NOTE — Progress Notes (Addendum)
Patient assistance forms completed and uploaded for patient to sign and provide information to the [employer] program for the medication Entresto.  Debbora Dus, CPP notified  Avel Sensor, [place] Assistant [phone]  Reviewed forms and sent to front desk for printing. Pt will come in office tomorrow to sign.  Debbora Dus, PharmD Clinical Pharmacist [facility] [phone]

## 2020-12-31 ENCOUNTER — Other Ambulatory Visit: Payer: Self-pay | Admitting: Cardiovascular Disease

## 2021-01-05 NOTE — Telephone Encounter (Signed)
Application reviewed and patient portion faxed to [employer] Patient [employer]. Provider portion faxed to Ida Rogue, MD for signature and fax.  Debbora Dus, PharmD Clinical Pharmacist [facility] [phone]

## 2021-01-06 ENCOUNTER — Ambulatory Visit (INDEPENDENT_AMBULATORY_CARE_PROVIDER_SITE_OTHER): Payer: Medicare HMO | Admitting: Family Medicine

## 2021-01-06 ENCOUNTER — Encounter: Payer: Self-pay | Admitting: Family Medicine

## 2021-01-06 ENCOUNTER — Other Ambulatory Visit: Payer: Self-pay

## 2021-01-06 VITALS — BP 110/70 | HR 67 | Temp 96.2°F | Ht 70.0 in | Wt 230.2 lb

## 2021-01-06 DIAGNOSIS — M5416 Radiculopathy, lumbar region: Secondary | ICD-10-CM | POA: Diagnosis not present

## 2021-01-06 DIAGNOSIS — R200 Anesthesia of skin: Secondary | ICD-10-CM

## 2021-01-06 MED ORDER — PREDNISONE 20 MG PO TABS: ORAL_TABLET | ORAL | 0 refills | Status: DC

## 2021-01-06 NOTE — Progress Notes (Signed)
Ryan Morgan T. Ryan Lightner, MD, Ryan Morgan, [id number]  Phone: [phone]  FAX: [place] - 80 y.o. male  MRN [id number]  Date of Birth: Jun 04, 1940  Date: 01/06/2021  PCP: Abner Greenspan, MD  Referral: Abner Greenspan, MD  Chief Complaint  Patient presents with   Leg Numbness    Bilateral   Sciatica    Bilateral    This visit occurred during the SARS-CoV-2 public health emergency.  Safety protocols were in place, including screening questions prior to the visit, additional usage of staff PPE, and extensive cleaning of exam room while observing appropriate contact time as indicated for disinfecting solutions.   Subjective:   Ryan Morgan is a 80 y.o. very pleasant male patient with Body mass index is 33.04 kg/m. who presents with the following:  B leg numbness and sciatica.  He is a very well-known gentleman, and he is having some numbness and tingling going down his both legs.  This happens when he is standing for any period of time after about 1 minute or so.  This is in the posterior aspect of the legs.  Does feel better when he is resting and sitting down.  This pretty much numbness or numbness without any Significant pain or tingling.  He does not describe any strength changes. Is not having any incontinence or saddle anesthesia.  Ryan Morgan has had back pain in the past, this is not been an ongoing chronic problem for the patient.  He has never had surgery.  Pred x 10  Lab Results  Component Value Date   HGBA1C 6.5 (A) 11/25/2020     Review of Systems is noted in the HPI, as appropriate   Objective:   BP 110/70   Pulse 67   Temp (!) 96.2 F (35.7 C) (Temporal)   Ht '5\' 10"'$  (1.778 m)   Wt 230 lb 4 oz (104.4 kg)   SpO2 98%   BMI 33.04 kg/m    Range of motion at  the waist: Flexion, extension, lateral bending and rotation: Forward flexion is to 100.  Extension is  preserved.  Lateral bending is preserved as well.  No echymosis or edema Rises to examination table with mild difficulty Gait: minimally antalgic  Inspection/Deformity: N Paraspinus Tenderness: Mild tenderness from L3-S1 bilaterally  B Ankle Dorsiflexion (L5,4): 5/5 B Great Toe Dorsiflexion (L5,4): 5/5 Heel Walk (L5): WNL Toe Walk (S1): WNL Rise/Squat (L4): WNL, mild pain  SENSORY B Medial Foot (L4): WNL B Dorsum (L5): WNL B Lateral (S1): WNL Light Touch: WNL Pinprick: WNL  B SLR, seated: neg B SLR, supine: neg B FABER: neg B Greater Troch: NT B Log Roll: neg  Radiology: No results found.  Assessment and Plan:     ICD-10-CM   1. Bilateral leg numbness  R20.0     2. Lumbar radiculopathy, acute  M54.16      Acute back pain with some prior back pain historically. Pretty classic history for some radicular symptoms with numbness bilaterally.  Most likely combination of degenerative changes in the spine with combination of spondyloarthropathy and disc disease.  Given with basic motion.  Move as able normally.  I am going to do a burst of some steroids now, if the symptoms persist to 4 weeks follow-up.  Meds ordered this encounter  Medications   predniSONE (DELTASONE) 20 MG tablet    Sig: 2 tabs po daily for  5 days, then 1 tab po daily for 5 days    Dispense:  15 tablet    Refill:  0   There are no discontinued medications. No orders of the defined types were placed in this encounter.   Follow-up: No follow-ups on file.  Dragon [employer] One speech-to-text software was used for transcription in this dictation.  Possible transcriptional errors can occur using Editor, commissioning.   Signed,  Ryan Morgan. Nilsa Macht, MD   Outpatient Encounter Medications as of 01/06/2021  Medication Sig   acarbose (PRECOSE) 25 MG tablet TAKE 1 TABLET BY MOUTH THREE TIMES DAILY WITH MEALS   carvedilol (COREG) 6.25 MG tablet TAKE 1 TABLET BY MOUTH TWICE DAILY WITH MEALS   cetirizine  (ZYRTEC) 10 MG tablet Take 10 mg by mouth 2 (two) times daily.    cholecalciferol (VITAMIN D3) 25 MCG (1000 UNIT) tablet Take 3,000 Units by mouth daily.   clobetasol cream (TEMOVATE) [id number] % Apply 1 application topically 2 (two) times daily. To affected areas   clopidogrel (PLAVIX) 75 MG tablet Take 1 tablet by mouth once daily   ENTRESTO 49-51 MG Take 1 tablet by mouth twice daily   ezetimibe (ZETIA) 10 MG tablet Take 1 tablet (10 mg total) by mouth daily. PLEASE SCHEDULE OFFICE VISIT FOR FURTHER REFILLS. THANK YOU!   glucose blood (ONETOUCH ULTRA) test strip 1 each by Other route in the morning and at bedtime. And lancets 2/day   LANCETS ULTRA THIN 30G MISC by Does not apply route. One Touch   metFORMIN (GLUCOPHAGE-XR) 500 MG 24 hr tablet Take 1 tablet by mouth once daily with breakfast   Multiple Vitamin (MULTIVITAMIN WITH MINERALS) TABS tablet Take 1 tablet by mouth daily.   Multiple Vitamins-Minerals (AIRBORNE) TBEF Take 1 tablet by mouth daily as needed (for immune system support). Immune Health/Support   nystatin cream (MYCOSTATIN) Apply 1 application topically 2 (two) times daily as needed for dry skin.   predniSONE (DELTASONE) 20 MG tablet 2 tabs po daily for 5 days, then 1 tab po daily for 5 days   repaglinide (PRANDIN) 2 MG tablet TAKE 1 TABLET BY MOUTH THREE TIMES DAILY BEFORE MEAL(S)   spironolactone (ALDACTONE) 25 MG tablet Take 1 tablet by mouth once daily   tetrahydrozoline 0.05 % ophthalmic solution Place 1-2 drops into both eyes 3 (three) times daily as needed (for dry/irritated eyes).   Triamcinolone Acetonide (NASACORT ALLERGY 24HR NA) Place 2 sprays into the nose at bedtime as needed (allergies).    rosuvastatin (CRESTOR) 10 MG tablet Take 1 tablet (10 mg total) by mouth daily. (Patient not taking: Reported on 01/06/2021)   [DISCONTINUED] nebivolol (BYSTOLIC) 5 MG tablet Take 1 tablet (5 mg total) by mouth 2 (two) times daily.   No facility-administered encounter medications  on file as of 01/06/2021.

## 2021-01-10 ENCOUNTER — Encounter: Payer: Self-pay | Admitting: Family Medicine

## 2021-01-11 ENCOUNTER — Telehealth: Payer: Self-pay

## 2021-01-11 NOTE — Telephone Encounter (Signed)
PA application for [facility] prescriber information was faxed from [facility], [facility] PharmD  Dr. Rockey Situ signed form PA application faxed to [employer]

## 2021-02-18 ENCOUNTER — Other Ambulatory Visit: Payer: Self-pay | Admitting: Endocrinology

## 2021-02-18 ENCOUNTER — Other Ambulatory Visit: Payer: Self-pay | Admitting: Cardiovascular Disease

## 2021-02-27 ENCOUNTER — Other Ambulatory Visit: Payer: Self-pay | Admitting: Cardiovascular Disease

## 2021-03-08 ENCOUNTER — Ambulatory Visit: Payer: Medicare HMO | Admitting: Family Medicine

## 2021-03-11 ENCOUNTER — Other Ambulatory Visit: Payer: Self-pay | Admitting: Endocrinology

## 2021-03-15 ENCOUNTER — Ambulatory Visit (INDEPENDENT_AMBULATORY_CARE_PROVIDER_SITE_OTHER): Payer: Medicare HMO | Admitting: Family Medicine

## 2021-03-15 ENCOUNTER — Other Ambulatory Visit: Payer: Self-pay

## 2021-03-15 VITALS — BP 106/60 | HR 66 | Temp 97.6°F | Ht 70.0 in | Wt 234.2 lb

## 2021-03-15 DIAGNOSIS — M5416 Radiculopathy, lumbar region: Secondary | ICD-10-CM

## 2021-03-15 DIAGNOSIS — R200 Anesthesia of skin: Secondary | ICD-10-CM | POA: Diagnosis not present

## 2021-03-15 DIAGNOSIS — Z23 Encounter for immunization: Secondary | ICD-10-CM

## 2021-03-15 MED ORDER — GABAPENTIN 100 MG PO CAPS: 100.0000 mg | ORAL_CAPSULE | Freq: Every day | ORAL | 3 refills | Status: DC

## 2021-03-15 MED ORDER — PREDNISONE 20 MG PO TABS: ORAL_TABLET | ORAL | 0 refills | Status: DC

## 2021-03-15 NOTE — Progress Notes (Signed)
Ryan Holian T. Rebel Willcutt, MD, [name] at [facility] [name], [address]  Phone: [phone]  FAX: [place] - 80 y.o. male  MRN [id number]  Date of Birth: 30-Nov-1940  Date: 03/15/2021  PCP: Ryan Greenspan, MD  Referral: Ryan Greenspan, MD  Chief Complaint  Patient presents with   Follow-up    Leg Numbness/Sciatica   Numbness    In feet    This visit occurred during the SARS-CoV-2 public health emergency.  Safety protocols were in place, including screening questions prior to the visit, additional usage of staff PPE, and extensive cleaning of exam room while observing appropriate contact time as indicated for disinfecting solutions.   Subjective:   Ryan Morgan is a 80 y.o. very pleasant male patient with Body mass index is 33.61 kg/m. who presents with the following:  B leg numbness with back pain and radicular pain  He continues to have some sciatica symptoms with radicular pain on the left side and pain in and continued numbness in the feet.  This is improved quite a bit compared to the last time I saw him.  He noticed some relief of symptoms right away after starting his prednisone.  He did remove some mats in the kitchen due to his wife having some significant neuropathy, and he was worried about hitting these himself too.  01/06/2021 Last OV with Ryan Loffler, MD  B leg numbness and sciatica.  He is a very well-known gentleman, and he is having some numbness and tingling going down his both legs.  This happens when he is standing for any period of time after about 1 minute or so.  This is in the posterior aspect of the legs.  Does feel better when he is resting and sitting down.  This pretty much numbness or numbness without any Significant pain or tingling.  He does not describe any strength changes. Is not having any incontinence or saddle anesthesia.   Ryan Morgan has had back pain in the past,  this is not been an ongoing chronic problem for the patient.  He has never had surgery.   Pred x 10   Review of Systems is noted in the HPI, as appropriate   Objective:   BP 106/60   Pulse 66   Temp 97.6 F (36.4 C) (Temporal)   Ht 5\' 10"  (1.778 m)   Wt 234 lb 4 oz (106.3 kg)   SpO2 97%   BMI 33.61 kg/m   GEN: No acute distress; alert,appropriate. PULM: Breathing comfortably in no respiratory distress PSYCH: Normally interactive.    Relatively nontender in the lumbar spine region, does have no tenderness in the GTB and in the buttocks region.  He has a negative straight leg raise.  Strength is grossly preserved bilateral lower extremities as well as sensation.  Is otherwise neurovascularly intact.  Radiology: No results found.  Assessment and Plan:     ICD-10-CM   1. Lumbar radiculopathy, acute  M54.16     2. Bilateral leg numbness  R20.0     3. Need for immunization against influenza  Z23 Flu Vaccine QUAD High Dose(Fluad)     Improving radicular symptoms with some tingling in the feet region.  I am going to give him some additional prednisone and put him on some low-dose gabapentin.  If this helps, he certainly could go up quite a bit on this.  Meds ordered this encounter  Medications  predniSONE (DELTASONE) 20 MG tablet    Sig: 2 tabs po daily for 5 days, then 1 tab po daily for 5 days    Dispense:  15 tablet    Refill:  0   gabapentin (NEURONTIN) 100 MG capsule    Sig: Take 1 capsule (100 mg total) by mouth at bedtime.    Dispense:  30 capsule    Refill:  3   Medications Discontinued During This Encounter  Medication Reason   rosuvastatin (CRESTOR) 10 MG tablet Side effect (s)   predniSONE (DELTASONE) 20 MG tablet Completed Course   Orders Placed This Encounter  Procedures   Flu Vaccine QUAD High Dose(Fluad)    Follow-up: No follow-ups on file.  Dragon [employer] One speech-to-text software was used for transcription in this dictation.  Possible  transcriptional errors can occur using Editor, commissioning.   Signed,  Ryan Morgan. Garo Heidelberg, MD   Outpatient Encounter Medications as of 03/15/2021  Medication Sig   acarbose (PRECOSE) 25 MG tablet TAKE 1 TABLET BY MOUTH THREE TIMES DAILY WITH MEALS   carvedilol (COREG) 6.25 MG tablet TAKE 1 TABLET BY MOUTH TWICE DAILY WITH MEALS   cetirizine (ZYRTEC) 10 MG tablet Take 10 mg by mouth 2 (two) times daily.    cholecalciferol (VITAMIN D3) 25 MCG (1000 UNIT) tablet Take 3,000 Units by mouth daily.   clobetasol cream (TEMOVATE) 3.08 % Apply 1 application topically 2 (two) times daily. To affected areas   clopidogrel (PLAVIX) 75 MG tablet Take 1 tablet by mouth once daily   ENTRESTO 49-51 MG Take 1 tablet by mouth twice daily   ezetimibe (ZETIA) 10 MG tablet Take 1 tablet (10 mg total) by mouth daily. PLEASE SCHEDULE OFFICE VISIT FOR FURTHER REFILLS. THANK YOU!   gabapentin (NEURONTIN) 100 MG capsule Take 1 capsule (100 mg total) by mouth at bedtime.   glucose blood (ONETOUCH ULTRA) test strip 1 each by Other route in the morning and at bedtime. And lancets 2/day   LANCETS ULTRA THIN 30G MISC by Does not apply route. One Touch   metFORMIN (GLUCOPHAGE-XR) 500 MG 24 hr tablet Take 1 tablet by mouth once daily with breakfast   Multiple Vitamin (MULTIVITAMIN WITH MINERALS) TABS tablet Take 1 tablet by mouth daily.   Multiple Vitamins-Minerals (AIRBORNE) TBEF Take 1 tablet by mouth daily as needed (for immune system support). Immune Health/Support   nystatin cream (MYCOSTATIN) Apply 1 application topically 2 (two) times daily as needed for dry skin.   predniSONE (DELTASONE) 20 MG tablet 2 tabs po daily for 5 days, then 1 tab po daily for 5 days   repaglinide (PRANDIN) 2 MG tablet TAKE 1 TABLET BY MOUTH THREE TIMES DAILY BEFORE MEAL(S)   spironolactone (ALDACTONE) 25 MG tablet Take 1 tablet by mouth once daily   tetrahydrozoline 0.05 % ophthalmic solution Place 1-2 drops into both eyes 3 (three) times daily  as needed (for dry/irritated eyes).   Triamcinolone Acetonide (NASACORT ALLERGY 24HR NA) Place 2 sprays into the nose at bedtime as needed (allergies).    [DISCONTINUED] nebivolol (BYSTOLIC) 5 MG tablet Take 1 tablet (5 mg total) by mouth 2 (two) times daily.   [DISCONTINUED] predniSONE (DELTASONE) 20 MG tablet 2 tabs po daily for 5 days, then 1 tab po daily for 5 days   [DISCONTINUED] rosuvastatin (CRESTOR) 10 MG tablet Take 1 tablet (10 mg total) by mouth daily. (Patient not taking: No sig reported)   No facility-administered encounter medications on file as of 03/15/2021.

## 2021-03-16 ENCOUNTER — Encounter: Payer: Self-pay | Admitting: Family Medicine

## 2021-03-19 ENCOUNTER — Other Ambulatory Visit: Payer: Self-pay | Admitting: Cardiovascular Disease

## 2021-03-19 NOTE — Telephone Encounter (Signed)
Scheduled

## 2021-03-19 NOTE — Telephone Encounter (Signed)
Please contact pt for future appointment. Pt due for 6 month f/u. 

## 2021-03-31 DIAGNOSIS — E113392 Type 2 diabetes mellitus with moderate nonproliferative diabetic retinopathy without macular edema, left eye: Secondary | ICD-10-CM | POA: Diagnosis not present

## 2021-03-31 LAB — HM DIABETES EYE EXAM

## 2021-04-01 ENCOUNTER — Other Ambulatory Visit: Payer: Self-pay | Admitting: Cardiovascular Disease

## 2021-04-16 ENCOUNTER — Ambulatory Visit (INDEPENDENT_AMBULATORY_CARE_PROVIDER_SITE_OTHER): Payer: Medicare HMO | Admitting: Cardiovascular Disease

## 2021-04-16 ENCOUNTER — Other Ambulatory Visit: Payer: Self-pay | Admitting: Endocrinology

## 2021-04-16 ENCOUNTER — Other Ambulatory Visit: Payer: Self-pay

## 2021-04-16 ENCOUNTER — Encounter: Payer: Self-pay | Admitting: Cardiovascular Disease

## 2021-04-16 VITALS — BP 120/60 | HR 69 | Ht 70.0 in | Wt 233.2 lb

## 2021-04-16 DIAGNOSIS — E11319 Type 2 diabetes mellitus with unspecified diabetic retinopathy without macular edema: Secondary | ICD-10-CM

## 2021-04-16 DIAGNOSIS — R0602 Shortness of breath: Secondary | ICD-10-CM

## 2021-04-16 DIAGNOSIS — T466X5A Adverse effect of antihyperlipidemic and antiarteriosclerotic drugs, initial encounter: Secondary | ICD-10-CM

## 2021-04-16 DIAGNOSIS — M791 Myalgia, unspecified site: Secondary | ICD-10-CM | POA: Diagnosis not present

## 2021-04-16 DIAGNOSIS — T466X5D Adverse effect of antihyperlipidemic and antiarteriosclerotic drugs, subsequent encounter: Secondary | ICD-10-CM

## 2021-04-16 DIAGNOSIS — I10 Essential (primary) hypertension: Secondary | ICD-10-CM

## 2021-04-16 DIAGNOSIS — I25118 Atherosclerotic heart disease of native coronary artery with other forms of angina pectoris: Secondary | ICD-10-CM

## 2021-04-16 DIAGNOSIS — E7800 Pure hypercholesterolemia, unspecified: Secondary | ICD-10-CM | POA: Diagnosis not present

## 2021-04-16 DIAGNOSIS — I255 Ischemic cardiomyopathy: Secondary | ICD-10-CM

## 2021-04-16 MED ORDER — EZETIMIBE 10 MG PO TABS: ORAL_TABLET | ORAL | 3 refills | Status: DC

## 2021-04-16 MED ORDER — CLOPIDOGREL BISULFATE 75 MG PO TABS: 75.0000 mg | ORAL_TABLET | Freq: Every day | ORAL | 3 refills | Status: DC

## 2021-04-16 MED ORDER — SPIRONOLACTONE 25 MG PO TABS: 25.0000 mg | ORAL_TABLET | Freq: Every day | ORAL | 3 refills | Status: DC

## 2021-04-16 MED ORDER — CARVEDILOL 6.25 MG PO TABS: 6.2500 mg | ORAL_TABLET | Freq: Two times a day (BID) | ORAL | 3 refills | Status: DC

## 2021-04-16 NOTE — Progress Notes (Signed)
Date:  04/17/2021   ID:  [name], DOB 1940-09-06, MRN [id number]  Patient Location:  [place] [address]   Provider location:   [facility], [place] office  PCP:  Glori Bickers, Wynelle Fanny, MD  Cardiologist:  Arvid Right [facility]  Chief Complaint  Patient presents with   6 month follow up     Patient c/o leg pain when standing too long. Medications reviewed by the patient verbally.      History of Present Illness:    Ryan Morgan is a 80 y.o. male with  past medical history of DM,  smoking hx,  coronary artery disease,  stent placed to his LAD in 2000,  (Lad stent 3.0 x 18 in 2000) repeat catheterization in January 2003 with 40% LAD, 50% diagonal disease, history of Wegener's granulomatosis, on chronic steroids diabetes. Prior thoracotomy surgery on the left for lung nodule, chronic pain on the left that is episodic, stable over several years Chronic shortness of breath, sedentary lifestyle, obesity/deconditioning Stent October 2018 for shortness of breath, chest tightness, fatigue Echocardiogram September 12, 2017 Ejection fraction 30 to 35% 2018:Successful angioplasty and drug-eluting stent placement to the ostial right posterior AV groove artery. who presents for follow up of his CAD and shortness of breath   In follow-up today Reports things are about the same, has back and sciatic pain if he stands in one place for too long Able to walk good distance without issues Denies chest pain, shortness of breath concerning for angina  Has been able to get his weight down through dietary changes No regular exercise program  Likes to go camping in the RV, has a trip scheduled next week  Previously declined statin, compliant with his Zetia Lab work reviewed A1C 6.5  Last echocardiogram June 2021 ejection fraction 35 to 40% up from 30 to 35%   EKG personally reviewed by myself on todays visit Shows NSR rate 69 bpm, old inferior MI  Other  past medical hx March 2020 Did not feel well, may have had some nausea, diaphoresis Got inside, nearsyncope  Felt by the hospital team that he could have had a vagal event given the  prodrome of nausea and sweating Tropon negative x3.  Lab work looks normal, no sense of dehydration He does not remember much of a prodrome of nausea or sweating before feeling dizzy Heart rate 55 and higher in the hospital, into the 60s  Prior CV studies:   The following studies were reviewed today:  Other past medical history reviewed Echocardiogram October 2018 ejection fraction 30 to 35%   Stress test October 2018 prior MI Prior MI, no significant ischemia Ejection fraction 36%   Cardiac catheterization October 2018 Patent LAD stent mild to moderate in-stent restenosis Occluded diagonal #1 Occluded right PDA with left-to-right collaterals Severe disease right posterior AV groove artery with collaterals Ejection fraction 40% Successful angioplasty stent to ostial right posterior AV groove artery Ejection fraction at that time 40%   Echocardiogram September 12, 2017 Ejection fraction 30 to 35%   Stress test from January 2009 was a treadmill study, ejection fraction estimated at 45%, with medium-sized area of moderate to severe hypoperfusion involving the septal region.  Past Medical History:  Diagnosis Date   Coronary artery disease    a. prior LAD stenting 2000; b. [place] 2003 LAD 40, D1 50; c. Lexiscan 02/2017: large defect of severe severity in the basal inferoseptal, basal inferior, mid inferoseptal, mid inferior and  apical inferior location, high risk; d. LHC 03/29/17: LM nl, patent LAD stent w/ 40% ISR, D1 100,  OM1 90, RPDA 100 w/ L-R collats, post atrio 80% s/p PCI/DES     Diabetes mellitus    Hyperlipidemia    Hypertension    Ischemic cardiomyopathy    a. TTE 02/2017: EF 30-35%, diffuse HK, normal LV diastolic function parameters, mild AI/MR, RV systolic function normal, PASP normal   Lung  nodule    a. s/p prior thoracotomy   Morbid obesity ([place])    Wegener's granulomatosis 2007   Past Surgical History:  Procedure Laterality Date   BASAL CELL CARCINOMA EXCISION     removed from face x 3   [place]   stent in heart   CORONARY ANGIOPLASTY  09/22/1998   s/p stent placement; [facility] 3.0 x 18 mm Ref [id number]   CORONARY STENT INTERVENTION N/A 03/29/2017   Procedure: CORONARY STENT INTERVENTION;  Surgeon: Wellington Hampshire, MD;  Location: [place] [facility];  Service: Cardiovascular;  Laterality: N/A;   LEFT HEART CATH AND CORONARY ANGIOGRAPHY N/A 03/29/2017   Procedure: LEFT HEART CATH AND CORONARY ANGIOGRAPHY;  Surgeon: Wellington Hampshire, MD;  Location: [place] [facility];  Service: Cardiovascular;  Laterality: N/A;   LUNG SURGERY     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   ULTRASOUND GUIDANCE FOR VASCULAR ACCESS  03/29/2017   Procedure: Ultrasound Guidance For Vascular Access;  Surgeon: Wellington Hampshire, MD;  Location: [place] [facility];  Service: Cardiovascular;;     Current Meds  Medication Sig   acarbose (PRECOSE) 25 MG tablet TAKE 1 TABLET BY MOUTH THREE TIMES DAILY WITH MEALS   cetirizine (ZYRTEC) 10 MG tablet Take 10 mg by mouth 2 (two) times daily.    cholecalciferol (VITAMIN D3) 25 MCG (1000 UNIT) tablet Take 3,000 Units by mouth daily.   clobetasol cream (TEMOVATE) 2.75 % Apply 1 application topically 2 (two) times daily. To affected areas   ENTRESTO 49-51 MG Take 1 tablet by mouth twice daily   fexofenadine (ALLEGRA) 180 MG tablet Take 180 mg by mouth daily.   gabapentin (NEURONTIN) 100 MG capsule Take 1 capsule (100 mg total) by mouth at bedtime.   glucose blood (ONETOUCH ULTRA) test strip 1 each by Other route in the morning and at bedtime. And lancets 2/day   LANCETS ULTRA THIN 30G MISC by Does not apply route. One Touch   metFORMIN (GLUCOPHAGE-XR) 500 MG 24 hr tablet Take 1 tablet by mouth once daily with  breakfast   Multiple Vitamin (MULTIVITAMIN WITH MINERALS) TABS tablet Take 1 tablet by mouth daily.   Multiple Vitamins-Minerals (AIRBORNE) TBEF Take 1 tablet by mouth daily as needed (for immune system support). Immune Health/Support   nystatin cream (MYCOSTATIN) Apply 1 application topically 2 (two) times daily as needed for dry skin.   predniSONE (DELTASONE) 20 MG tablet 2 tabs po daily for 5 days, then 1 tab po daily for 5 days   repaglinide (PRANDIN) 2 MG tablet TAKE 1 TABLET BY MOUTH THREE TIMES DAILY BEFORE MEAL(S)   rosuvastatin (CRESTOR) 5 MG tablet Take 1 tablet (5 mg total) by mouth daily.   tetrahydrozoline 0.05 % ophthalmic solution Place 1-2 drops into both eyes 3 (three) times daily as needed (for dry/irritated eyes).   Triamcinolone Acetonide (NASACORT ALLERGY 24HR NA) Place 2 sprays into the nose at bedtime as needed (allergies).    [DISCONTINUED] carvedilol (COREG) 6.25 MG tablet  TAKE 1 TABLET BY MOUTH TWICE DAILY WITH MEALS   [DISCONTINUED] clopidogrel (PLAVIX) 75 MG tablet Take 1 tablet by mouth once daily   [DISCONTINUED] ezetimibe (ZETIA) 10 MG tablet TAKE 1 TABLET BY MOUTH ONCE DAILY . APPOINTMENT REQUIRED FOR FUTURE REFILLS   [DISCONTINUED] spironolactone (ALDACTONE) 25 MG tablet Take 1 tablet by mouth once daily     Allergies:   Metformin and related, Benicar hct [olmesartan medoxomil-hctz], Bystolic [nebivolol hcl], Dapsone, Lovaza [omega-3-acid ethyl esters], Niaspan [niacin er], Peanut-containing drug products, and Septra [bactrim]   Social History   Tobacco Use   Smoking status: Former    Packs/day: 0.50    Years: 20.00    Pack years: 10.00    Types: Cigarettes    Quit date: 05/30/1978    Years since quitting: 42.9   Smokeless tobacco: Never  Vaping Use   Vaping Use: Never used  Substance Use Topics   Alcohol use: No    Alcohol/week: 0.0 standard drinks   Drug use: No     Family Hx: The patient's family history includes Cancer in his mother; Stroke in  his maternal grandfather. There is no history of Diabetes.  ROS:   Please see the history of present illness.    Review of Systems  Constitutional: Negative.   Respiratory: Negative.    Cardiovascular: Negative.   Gastrointestinal: Negative.   Musculoskeletal:  Positive for back pain.  Neurological: Negative.   Psychiatric/Behavioral: Negative.    All other systems reviewed and are negative.   Labs/Other Tests and Data Reviewed:    Recent Labs: No results found for requested labs within last 8760 hours.   Recent Lipid Panel Lab Results  Component Value Date/Time   CHOL 185 09/23/2020 09:14 AM   TRIG 274 (H) 09/23/2020 09:14 AM   HDL 34 (L) 09/23/2020 09:14 AM   CHOLHDL 5.4 (H) 09/23/2020 09:14 AM   CHOLHDL 3 12/03/2019 09:39 AM   LDLCALC 104 (H) 09/23/2020 09:14 AM   LDLDIRECT 100.0 05/17/2017 01:49 PM    Wt Readings from Last 3 Encounters:  04/16/21 233 lb 4 oz (105.8 kg)  03/15/21 234 lb 4 oz (106.3 kg)  01/06/21 230 lb 4 oz (104.4 kg)     Exam:    Vital Signs: Vital signs may also be detailed in the HPI BP 120/60 (BP Location: Left Arm, Patient Position: Sitting, Cuff Size: Normal)   Pulse 69   Ht 5\' 10"  (1.778 m)   Wt 233 lb 4 oz (105.8 kg)   SpO2 (!) 69%   BMI 33.47 kg/m    Constitutional:  oriented to person, place, and time. No distress.  Obese HENT:  Head: Grossly normal Eyes:  no discharge. No scleral icterus.  Neck: No JVD, no carotid bruits  Cardiovascular: Regular rate and rhythm, no murmurs appreciated Pulmonary/Chest: Clear to auscultation bilaterally, no wheezes or rails Abdominal: Soft.  no distension.  no tenderness.  Musculoskeletal: Normal range of motion Neurological:  normal muscle tone. Coordination normal. No atrophy Skin: Skin warm and dry Psychiatric: normal affect, pleasant   ASSESSMENT & PLAN:    Coronary artery disease of native artery of native heart with stable angina pectoris (HCC) No further episodes of near syncope or  syncope Currently with no symptoms of angina. No further workup at this time. Continue current medication regimen. Zetia 10 daily, willing to retry low-dose statin, start crestor 5 daily (or as tolerated)  Granulomatosis with polyangiitis, unspecified whether renal involvement ([place]) Stable symptoms for the patient  Morbid  obesity ([place]) We have encouraged continued exercise, careful diet management in an effort to lose weight. -Weight has been trending down  Essential hypertension Blood pressure is well controlled on today's visit. No changes made to the medications. Denies orthostasis  Shortness of breath Recommend walking for conditioning  Ischemic cardiomyopathy Continue Entresto, carvedilol, spironolactone Previously discussed [place] and [place] with endocrinology, too expensive  Near syncope No further episodes near syncope or vagal episodes   Total encounter time more than 25 minutes  Greater than 50% was spent in counseling and coordination of care with the patient  Signed, Ida Rogue, MD  04/17/2021 5:03 PM    [place] Office [address], [place], [place] [address]

## 2021-04-16 NOTE — Patient Instructions (Addendum)
Medication Instructions:  Try crestor 5 mg daily  If you need a refill on your cardiac medications before your next appointment, please call your pharmacy.   Lab work: No new labs needed  Testing/Procedures: No new testing needed  Follow-Up: At [facility], you and your health needs are our priority.  As part of our continuing mission to provide you with exceptional heart care, we have created designated Provider Care Teams.  These Care Teams include your primary Cardiologist (physician) and Advanced Practice Providers (APPs -  Physician Assistants and Nurse Practitioners) who all work together to provide you with the care you need, when you need it.  You will need a follow up appointment in 12 months  Providers on your designated Care Team:   Murray Hodgkins, NP Christell Faith, PA-C Cadence Kathlen Mody, [place]  COVID-19 Vaccine Information can be found at: [email or web address] For questions related to vaccine distribution or appointments, please email [email or web address] or call [phone].

## 2021-04-19 ENCOUNTER — Other Ambulatory Visit: Payer: Self-pay | Admitting: Endocrinology

## 2021-04-19 ENCOUNTER — Encounter: Payer: Self-pay | Admitting: Family Medicine

## 2021-05-06 ENCOUNTER — Telehealth: Payer: Self-pay

## 2021-05-06 NOTE — Chronic Care Management (AMB) (Addendum)
Chronic Care Management Pharmacy Assistant   Name: Ryan Morgan  MRN: [id number] DOB: 04-Oct-1940  Reason for Encounter:Cholesterol  Disease State    Recent office visits:  03/15/21-Family Medicine-Patient presented for follow up sciatica and leg pain.started prednisone 20mg  take 2 tablets daily for 5 days then 1 daily for 5 days,start Gabapentin 100mg  take 1 capsule at  bedtime. 01/06/21-Family Medicine-Patient presented for leg numbness,start prednisone 20mg  taper 2 tablets daily for 5 days then 1 tablet daily   Recent consult visits:  04/16/21-Cardiology-Patient presented for follow up heart disease.Labs,EKG reviewed.Retry Rosuvastatin 5mg  once daily,increase walking to condition breathing .  Hospital visits:  None in previous 6 months  Medications: Outpatient Encounter Medications as of 05/06/2021  Medication Sig   acarbose (PRECOSE) 25 MG tablet TAKE 1 TABLET BY MOUTH THREE TIMES DAILY WITH MEALS   carvedilol (COREG) 6.25 MG tablet Take 1 tablet (6.25 mg total) by mouth 2 (two) times daily with a meal.   cetirizine (ZYRTEC) 10 MG tablet Take 10 mg by mouth 2 (two) times daily.    cholecalciferol (VITAMIN D3) 25 MCG (1000 UNIT) tablet Take 3,000 Units by mouth daily.   clobetasol cream (TEMOVATE) 8.25 % Apply 1 application topically 2 (two) times daily. To affected areas   clopidogrel (PLAVIX) 75 MG tablet Take 1 tablet (75 mg total) by mouth daily.   ENTRESTO 49-51 MG Take 1 tablet by mouth twice daily   ezetimibe (ZETIA) 10 MG tablet TAKE 1 TABLET BY MOUTH ONCE DAILY . APPOINTMENT REQUIRED FOR FUTURE REFILLS   fexofenadine (ALLEGRA) 180 MG tablet Take 180 mg by mouth daily.   gabapentin (NEURONTIN) 100 MG capsule Take 1 capsule (100 mg total) by mouth at bedtime.   glucose blood (ONETOUCH ULTRA) test strip 1 each by Other route in the morning and at bedtime. And lancets 2/day   LANCETS ULTRA THIN 30G MISC by Does not apply route. One Touch   metFORMIN (GLUCOPHAGE-XR) 500 MG 24  hr tablet Take 1 tablet by mouth once daily with breakfast   Multiple Vitamin (MULTIVITAMIN WITH MINERALS) TABS tablet Take 1 tablet by mouth daily.   Multiple Vitamins-Minerals (AIRBORNE) TBEF Take 1 tablet by mouth daily as needed (for immune system support). Immune Health/Support   nystatin cream (MYCOSTATIN) Apply 1 application topically 2 (two) times daily as needed for dry skin.   predniSONE (DELTASONE) 20 MG tablet 2 tabs po daily for 5 days, then 1 tab po daily for 5 days   repaglinide (PRANDIN) 2 MG tablet TAKE 1 TABLET BY MOUTH THREE TIMES DAILY BEFORE MEAL(S)   rosuvastatin (CRESTOR) 5 MG tablet Take 1 tablet (5 mg total) by mouth daily.   spironolactone (ALDACTONE) 25 MG tablet Take 1 tablet (25 mg total) by mouth daily.   tetrahydrozoline 0.05 % ophthalmic solution Place 1-2 drops into both eyes 3 (three) times daily as needed (for dry/irritated eyes).   Triamcinolone Acetonide (NASACORT ALLERGY 24HR NA) Place 2 sprays into the nose at bedtime as needed (allergies).    [DISCONTINUED] nebivolol (BYSTOLIC) 5 MG tablet Take 1 tablet (5 mg total) by mouth 2 (two) times daily.   No facility-administered encounter medications on file as of 05/06/2021.   05/06/2021 Name: Ryan Morgan MRN: [id number] DOB: 06-09-1940 Ryan Morgan is a 80 y.o. year old male who is a primary care patient of Tower, Wynelle Fanny, MD.  Comprehensive medication review performed; Spoke to patient regarding cholesterol   Contacted patient on 05/18/21 to discuss hyperlipidemia.  Lipid  Panel    Component Value Date/Time   CHOL 185 09/23/2020 0914   TRIG 274 (H) 09/23/2020 0914   HDL 34 (L) 09/23/2020 0914   LDLCALC 104 (H) 09/23/2020 0914   LDLDIRECT 100.0 05/17/2017 1349    10-year ASCVD risk score: The 10-year ASCVD risk score (Arnett DK, et al., 2019) is: 54.2%   Values used to calculate the score:     Age: 75 years     Sex: Male     Is Non-Hispanic African American: No     Diabetic: Yes     Tobacco smoker:  Yes     Systolic Blood Pressure: 536 mmHg     Is BP treated: Yes     HDL Cholesterol: 34 mg/dL     Total Cholesterol: 185 mg/dL  Current antihyperlipidemic regimen:  Rosuvastatin 5 mg - take 1 tablet daily Ezetimibe 10 mg - take 1 tablet daily   Is patient taking above medications as prescribed? The patient reports he is only taking rosuvastatin every now and then. He is unsure of how he is tolerating this medication.  Previous antihyperlipidemic medications tried: atorvastatin  ASCVD risk enhancing conditions: age >85, DM, and HTN  What recent interventions/DTPs have been made by any provider to improve Cholesterol control since last CPP Visit:  Cardiology retrial - rosuvastatin 5 mg daily as tolerated  Any recent hospitalizations or ED visits since last visit with CPP? No  The patient is not feeling well for a few days, been in the bed, no appetite. The patient will have a virtual visit with PCP today .  What diet changes have been made to improve Cholesterol?  None identified  What exercise is being done to improve Cholesterol?       The patient stays active with travel and maintenance of his home.  Adherence Review: Does the patient have >5 day gap between last estimated fill dates? No  Annual wellness visit in last year? No Most Recent BP reading: 120/60  69-P 04/16/21  If Diabetic: Most recent A1C reading: 6.5% Last eye exam / retinopathy screening: 03/31/21 Last diabetic foot exam: 11/25/20  Endocrinology appointment on 06/28/21 The patient has a virtual visit with PCP today. He is not feeling well.   Star Rating Drugs Medication Name/Dose: Last fill date Days supply Rosuvastatin 5mg   04/01/21 90 Entresto 49-51mg   03/20/21 90 Metformin 500mg   03/11/21 Ryan Morgan, CPP notified  Ryan Morgan, Ryan Morgan Assistant [phone]  I have reviewed the care management and care coordination activities outlined in this encounter and I am certifying  that I agree with the content of this note. Will revisit adherence to statin in 1 week.  Ryan Morgan, PharmD Clinical Pharmacist [facility] [phone]

## 2021-05-13 ENCOUNTER — Telehealth: Payer: Self-pay | Admitting: Endocrinology

## 2021-05-13 NOTE — Telephone Encounter (Signed)
MEDICATION: Repaglinide   PHARMACY:  [facility] [address] in [place] CONTACTED B and E?  YES, was advised told by pharmacy that we would not refill until he has an appointment with Dr Loanne Drilling - PATIENT HAS AN APPOINTMENT ON 06/28/2021 - THAT WAS SCHEDULED AT HIS CHECK OUT IN June 2022.  IS THIS A 90 DAY SUPPLY : YES  IS PATIENT OUT OF MEDICATION: YES  IF NOT; HOW MUCH IS LEFT:   LAST APPOINTMENT DATE: @11 /21/2022  NEXT APPOINTMENT DATE:@1 /30/2023  DO WE HAVE YOUR PERMISSION TO LEAVE A DETAILED MESSAGE?: YES  OTHER COMMENTS: PLEASE CALL PATIENT WHEN RX IS SENT SO THAT HE CAN BE SURE TO PICK UP

## 2021-05-13 NOTE — Telephone Encounter (Signed)
Pt will need an OV before more refills be given. I sent a message thru Mychart for him to contact the office to schedule an appt.

## 2021-05-18 ENCOUNTER — Other Ambulatory Visit (INDEPENDENT_AMBULATORY_CARE_PROVIDER_SITE_OTHER): Payer: Medicare HMO

## 2021-05-18 ENCOUNTER — Encounter: Payer: Self-pay | Admitting: Family Medicine

## 2021-05-18 ENCOUNTER — Other Ambulatory Visit: Payer: Self-pay

## 2021-05-18 ENCOUNTER — Telehealth: Payer: Self-pay | Admitting: Family Medicine

## 2021-05-18 ENCOUNTER — Other Ambulatory Visit: Payer: Self-pay | Admitting: Family Medicine

## 2021-05-18 ENCOUNTER — Telehealth (INDEPENDENT_AMBULATORY_CARE_PROVIDER_SITE_OTHER): Payer: Medicare HMO | Admitting: Family Medicine

## 2021-05-18 DIAGNOSIS — J069 Acute upper respiratory infection, unspecified: Secondary | ICD-10-CM | POA: Insufficient documentation

## 2021-05-18 LAB — POC INFLUENZA A&B (BINAX/QUICKVUE)
Influenza A, POC: NEGATIVE
Influenza B, POC: NEGATIVE

## 2021-05-18 LAB — POC COVID19 BINAXNOW: SARS Coronavirus 2 Ag: POSITIVE — AB

## 2021-05-18 MED ORDER — MOLNUPIRAVIR EUA 200MG CAPSULE: 4.0000 | ORAL_CAPSULE | Freq: Two times a day (BID) | ORAL | 0 refills | Status: AC

## 2021-05-18 NOTE — Patient Instructions (Signed)
Drink fluids and rest  Get some tylenol (acetaminophen) to use for pain or fever symptoms  Continue diabetic tussin if helpful   The office will call you to set up testing for flu and covid Continue to isolate yourself  We will make a plan based on results   If you suddenly get worse or have trouble breathing please go to the ER  Update if not starting to improve in a week or if worsening

## 2021-05-18 NOTE — Telephone Encounter (Signed)
Positive covid test  Sent molnupiravir to his pharmacy  Drink fluids and rest   Take as directed  Diarrhea may be side effect  If not tolerated stop med and let us know   Update if not starting to improve in a week or if worsening

## 2021-05-18 NOTE — Telephone Encounter (Signed)
Pt/wife notified of positive results and Dr. Marliss Coots comments and instructions and Rx was sent to pharmacy. Pt will keep [place] posted

## 2021-05-18 NOTE — Progress Notes (Signed)
Virtual Visit via Telephone Note  I connected with Ryan Morgan on 05/18/21 at 12:30 PM EST by telephone and verified that I am speaking with the correct person using two identifiers.  Location: Patient: home Provider: office    I discussed the limitations, risks, security and privacy concerns of performing an evaluation and management service by telephone and the availability of in person appointments. I also discussed with the patient that there may be a patient responsible charge related to this service. The patient expressed understanding and agreed to proceed.  Parties involved in encounter  Patient: Ryan Morgan   Provider:  Loura Pardon MD   History of Present Illness: Pt presents with fever and uri symptoms  Symptoms started Saturday  Head cold symptoms 2-3 days  By Monday- exhausted, and very weak , some aches  Temp 98.3 (high for him)  A little better today  (ibuprofen today)  Little nasal congestion  Cough- worse sat/Sunday and part of Monday- better today  Not sob, no trouble breathing  A little diarrhea No n/v  No headache  Ears are ok  No sore throat   He did not do a covid test   No sick contacts  No one is sick at home    Otc DM tussin  Ibuprofen Some fluids    Had flu shot in oct Covid immunized  Utd pna vaccines  Patient Active Problem List   Diagnosis Date Noted   Viral URI with cough 05/18/2021   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/31/2019   Right leg pain 12/17/2018   Ischemic cardiomyopathy 09/10/2018   Hip joint effusion, left 05/03/2018   Laceration of arm 12/04/2017   Current chronic use of systemic steroids 11/15/2016   Routine general medical examination at a health care facility 11/09/2015   Hearing loss 11/09/2015   History of colonic polyps 11/09/2015   Rash of hands 03/10/2015   Colon cancer screening 06/09/2014   Blepharitis, bilateral 06/02/2014   Benign paroxysmal positional vertigo 12/14/2013   Bursitis  of right hip 10/17/2013   Coronary artery disease of native artery of native heart with stable angina pectoris ([place]) 03/19/2013   Encounter for Medicare annual wellness exam 03/05/2013   Adverse effect of glucocorticoid or synthetic analogue 03/05/2013   Osteopenia 03/05/2013   Class 1 obesity with serious comorbidity and body mass index (BMI) of 33.0 to 33.9 in adult 07/15/2011   Prostate cancer screening 07/10/2011   Hypertension 08/30/2010   Hyperlipidemia associated with type 2 diabetes mellitus ([place]) 08/30/2010   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 prior LAD stenting 2000; b. [place] 2003 LAD 40, D1 50; c. Lexiscan 02/2017: large defect of severe severity in the basal inferoseptal, basal inferior, mid inferoseptal, mid inferior and apical inferior location, high risk; d. LHC 03/29/17: LM nl, patent LAD stent w/ 40% ISR, D1 100,  OM1 90, RPDA 100 w/ L-R collats, post atrio 80% s/p PCI/DES     Diabetes mellitus    Hyperlipidemia    Hypertension    Ischemic cardiomyopathy    a. TTE 02/2017: EF 30-35%, diffuse HK, normal LV diastolic function parameters, mild AI/MR, RV systolic function normal, PASP normal   Lung nodule    a. s/p prior thoracotomy   Morbid obesity ([place])    Wegener's granulomatosis  2007   Past Surgical History:  Procedure Laterality Date   BASAL CELL CARCINOMA EXCISION     removed from face x 3   [place]   stent in heart   CORONARY ANGIOPLASTY  09/22/1998   s/p stent placement; [facility] 3.0 x 18 mm Ref [id number]   CORONARY STENT INTERVENTION N/A 03/29/2017   Procedure: CORONARY STENT INTERVENTION;  Surgeon: Wellington Hampshire, MD;  Location: [facility];  Service: Cardiovascular;  Laterality: N/A;   LEFT HEART  CATH AND CORONARY ANGIOGRAPHY N/A 03/29/2017   Procedure: LEFT HEART CATH AND CORONARY ANGIOGRAPHY;  Surgeon: Wellington Hampshire, MD;  Location: [facility];  Service: Cardiovascular;  Laterality: N/A;   LUNG SURGERY     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   ULTRASOUND GUIDANCE FOR VASCULAR ACCESS  03/29/2017   Procedure: Ultrasound Guidance For Vascular Access;  Surgeon: Wellington Hampshire, MD;  Location: [facility];  Service: Cardiovascular;;   Social History   Tobacco Use   Smoking status: Former    Packs/day: 0.50    Years: 20.00    Pack years: 10.00    Types: Cigarettes    Quit date: 05/30/1978    Years since quitting: 42.9   Smokeless tobacco: Never  Vaping Use   Vaping Use: Never used  Substance Use Topics   Alcohol use: No    Alcohol/week: 0.0 standard drinks   Drug use: No   Family History  Problem Relation Age of Onset   Cancer Mother        tumor on tonsil   Stroke Maternal Grandfather    Diabetes Neg Hx    Allergies  Allergen Reactions   Metformin And Related Itching   Benicar Hct [Olmesartan Medoxomil-Hctz] Itching    Painful and itchy knots on palms of hands   Bystolic [Nebivolol Hcl] Other (See Comments)    headache   Dapsone Hives   Lovaza [Omega-3-Acid Ethyl Esters] Other (See Comments)    Unknown reaction   Niaspan [Niacin Er] Other (See Comments)    flushing   Peanut-Containing Drug Products     Rash on hands from peanuts   Septra [Bactrim] Hives   Current Outpatient Medications on File Prior to Visit  Medication Sig Dispense Refill   acarbose (PRECOSE) 25 MG tablet TAKE 1 TABLET BY MOUTH THREE TIMES DAILY WITH MEALS 270 tablet 0   carvedilol (COREG) 6.25 MG tablet Take 1 tablet (6.25 mg total) by mouth 2 (two) times daily with a meal. 180 tablet 3   cetirizine (ZYRTEC) 10 MG tablet Take 10 mg by mouth 2 (two) times daily.      cholecalciferol (VITAMIN D3) 25 MCG (1000 UNIT) tablet Take 3,000 Units by mouth daily.      clobetasol cream (TEMOVATE) 9.21 % Apply 1 application topically 2 (two) times daily. To affected areas 30 g 1   clopidogrel (PLAVIX) 75 MG tablet Take 1 tablet (75 mg total) by mouth daily. 90 tablet 3   ENTRESTO 49-51 MG Take 1 tablet by mouth twice daily 180 tablet 2   ezetimibe (ZETIA) 10 MG tablet TAKE 1 TABLET BY MOUTH ONCE DAILY . APPOINTMENT REQUIRED FOR FUTURE REFILLS 90 tablet 3   fexofenadine (ALLEGRA) 180 MG tablet Take 180 mg by mouth daily.     gabapentin (NEURONTIN) 100 MG capsule Take 1 capsule (100 mg total) by mouth at bedtime. 30 capsule 3   glucose blood (ONETOUCH ULTRA)  test strip 1 each by Other route in the morning and at bedtime. And lancets 2/day 200 each 3   LANCETS ULTRA THIN 30G MISC by Does not apply route. One Touch     metFORMIN (GLUCOPHAGE-XR) 500 MG 24 hr tablet Take 1 tablet by mouth once daily with breakfast 90 tablet 0   Multiple Vitamin (MULTIVITAMIN WITH MINERALS) TABS tablet Take 1 tablet by mouth daily.     Multiple Vitamins-Minerals (AIRBORNE) TBEF Take 1 tablet by mouth daily as needed (for immune system support). Immune Health/Support     nystatin cream (MYCOSTATIN) Apply 1 application topically 2 (two) times daily as needed for dry skin.     repaglinide (PRANDIN) 2 MG tablet TAKE 1 TABLET BY MOUTH THREE TIMES DAILY BEFORE MEAL(S) 90 tablet 0   rosuvastatin (CRESTOR) 5 MG tablet Take 1 tablet (5 mg total) by mouth daily. 90 tablet 3   spironolactone (ALDACTONE) 25 MG tablet Take 1 tablet (25 mg total) by mouth daily. 90 tablet 3   tetrahydrozoline 0.05 % ophthalmic solution Place 1-2 drops into both eyes 3 (three) times daily as needed (for dry/irritated eyes).     Triamcinolone Acetonide (NASACORT ALLERGY 24HR NA) Place 2 sprays into the nose at bedtime as needed (allergies).      [DISCONTINUED] nebivolol (BYSTOLIC) 5 MG tablet Take 1 tablet (5 mg total) by mouth 2 (two) times daily. 180 tablet 3   No current facility-administered medications on file  prior to visit.   Review of Systems  Constitutional:  Positive for chills and malaise/fatigue. Negative for fever.  HENT:  Positive for congestion. Negative for ear pain, sinus pain and sore throat.   Eyes:  Negative for blurred vision, discharge and redness.  Respiratory:  Positive for cough and sputum production. Negative for shortness of breath, wheezing and stridor.   Cardiovascular:  Negative for chest pain, palpitations and leg swelling.  Gastrointestinal:  Negative for abdominal pain, diarrhea, nausea and vomiting.  Musculoskeletal:  Negative for myalgias.  Skin:  Negative for rash.  Neurological:  Negative for dizziness and headaches.   Observations/Objective: Pt sounds well, not in distress Mildly hoarse  Sounds congested/nasal Clears throat, coughs mildly /dry No audible wheeze or sob with speech Nl cognition, good historian Nl mood  Assessment and Plan: Problem List Items Addressed This Visit       Respiratory   Viral URI with cough    Day 3  Felt feverish and weak/little improvement today  Recommend fluids and rest and isolation  Tussin prn  Encouraged him to get some tylenol for aches/fever  Ordered covid and flu testing for drive through today  Will inst further after result  ER precautions discussed Update if not starting to improve in a week or if worsening          Follow Up Instructions: Drink fluids and rest  Get some tylenol (acetaminophen) to use for pain or fever symptoms  Continue diabetic tussin if helpful   The office will call you to set up testing for flu and covid Continue to isolate yourself  We will make a plan based on results   If you suddenly get worse or have trouble breathing please go to the ER  Update if not starting to improve in a week or if worsening    I discussed the assessment and treatment plan with the patient. The patient was provided an opportunity to ask questions and all were answered. The patient agreed with the  plan and demonstrated  an understanding of the instructions.   The patient was advised to call back or seek an in-person evaluation if the symptoms worsen or if the condition fails to improve as anticipated.  I provided 17 minutes of non-face-to-face time during this encounter.   Loura Pardon, MD

## 2021-05-18 NOTE — Assessment & Plan Note (Signed)
Day 3  Felt feverish and weak/little improvement today  Recommend fluids and rest and isolation  Tussin prn  Encouraged him to get some tylenol for aches/fever  Ordered covid and flu testing for drive through today  Will inst further after result  ER precautions discussed Update if not starting to improve in a week or if worsening

## 2021-05-18 NOTE — Telephone Encounter (Signed)
Please call patient back in about 1 week to discuss rosuvastatin - he should try taking it every day. Let us know if he has any concerns. Wait 1 week since patient not feeling well today.

## 2021-05-20 LAB — SARS-COV-2, NAA 2 DAY TAT

## 2021-05-20 LAB — NOVEL CORONAVIRUS, NAA: SARS-CoV-2, NAA: DETECTED — AB

## 2021-05-21 ENCOUNTER — Telehealth: Payer: Self-pay | Admitting: Endocrinology

## 2021-05-21 MED ORDER — REPAGLINIDE 2 MG PO TABS: ORAL_TABLET | ORAL | 0 refills | Status: DC

## 2021-05-21 NOTE — Telephone Encounter (Signed)
Script sent  

## 2021-05-21 NOTE — Telephone Encounter (Signed)
MEDICATION: repaglinide (PRANDIN) 2 MG tablet  PHARMACY:   [facility], [place] Phone:  [phone]  Fax:  [phone]      HAS THE PATIENT CONTACTED THEIR PHARMACY?  YES  IS THIS A 90 DAY SUPPLY : YES  IS PATIENT OUT OF MEDICATION: Will be out on Thursday 05/27/21  IF NOT; HOW MUCH IS LEFT:   LAST APPOINTMENT DATE: @12 /15/2022  NEXT APPOINTMENT DATE:@1 /30/2023  DO WE HAVE YOUR PERMISSION TO LEAVE A DETAILED MESSAGE?:  OTHER COMMENTS: When picked up PT only received a total of 60 instead of 270.   **Let patient know to contact pharmacy at the end of the day to make sure medication is ready. **  ** Please notify patient to allow 48-72 hours to process**  **Encourage patient to contact the pharmacy for refills or they can request refills through [facility]**

## 2021-05-25 NOTE — Progress Notes (Signed)
Subjective:   Ryan Morgan is a 80 y.o. male who presents for Medicare Annual/Subsequent preventive examination.  I connected with Kari Baars today by telephone and verified that I am speaking with the correct person using two identifiers. Location patient: home Location provider: work Persons participating in the virtual visit: patient, Marine scientist.    I discussed the limitations, risks, security and privacy concerns of performing an evaluation and management service by telephone and the availability of in person appointments. I also discussed with the patient that there may be a patient responsible charge related to this service. The patient expressed understanding and verbally consented to this telephonic visit.    Interactive audio and video telecommunications were attempted between this provider and patient, however failed, due to patient having technical difficulties OR patient did not have access to video capability.  We continued and completed visit with audio only.  Some vital signs may be absent or patient reported.   Time Spent with patient on telephone encounter: 20 minutes  Review of Systems     Cardiac Risk Factors include: advanced age (>15men, >23 women);diabetes mellitus;dyslipidemia;hypertension     Objective:    Today's Vitals   05/26/21 1332  Weight: 224 lb (101.6 kg)  Height: 5\' 10"  (1.778 m)   Body mass index is 32.14 kg/m.  Advanced Directives 05/26/2021 12/03/2019 09/12/2019 07/26/2019 11/23/2018 08/24/2018 08/24/2018  Does Patient Have a Medical Advance Directive? No No No No No No No  Does patient want to make changes to medical advance directive? Yes (MAU/Ambulatory/Procedural Areas - Information given) - - - - No - Patient declined No - Patient declined  Would patient like information on creating a medical advance directive? - No - Patient declined No - Patient declined No - Patient declined No - Patient declined No - Patient declined No - Patient declined     Current Medications (verified) Outpatient Encounter Medications as of 05/26/2021  Medication Sig   acarbose (PRECOSE) 25 MG tablet TAKE 1 TABLET BY MOUTH THREE TIMES DAILY WITH MEALS   carvedilol (COREG) 6.25 MG tablet Take 1 tablet (6.25 mg total) by mouth 2 (two) times daily with a meal.   cetirizine (ZYRTEC) 10 MG tablet Take 10 mg by mouth 2 (two) times daily.    cholecalciferol (VITAMIN D3) 25 MCG (1000 UNIT) tablet Take 3,000 Units by mouth daily.   clobetasol cream (TEMOVATE) 8.56 % Apply 1 application topically 2 (two) times daily. To affected areas   clopidogrel (PLAVIX) 75 MG tablet Take 1 tablet (75 mg total) by mouth daily.   ENTRESTO 49-51 MG Take 1 tablet by mouth twice daily   ezetimibe (ZETIA) 10 MG tablet TAKE 1 TABLET BY MOUTH ONCE DAILY . APPOINTMENT REQUIRED FOR FUTURE REFILLS   fexofenadine (ALLEGRA) 180 MG tablet Take 180 mg by mouth daily.   gabapentin (NEURONTIN) 100 MG capsule Take 1 capsule (100 mg total) by mouth at bedtime.   glucose blood (ONETOUCH ULTRA) test strip 1 each by Other route in the morning and at bedtime. And lancets 2/day   LANCETS ULTRA THIN 30G MISC by Does not apply route. One Touch   metFORMIN (GLUCOPHAGE-XR) 500 MG 24 hr tablet Take 1 tablet by mouth once daily with breakfast   Multiple Vitamin (MULTIVITAMIN WITH MINERALS) TABS tablet Take 1 tablet by mouth daily.   Multiple Vitamins-Minerals (AIRBORNE) TBEF Take 1 tablet by mouth daily as needed (for immune system support). Immune Health/Support   nystatin cream (MYCOSTATIN) Apply 1 application topically 2 (two)  times daily as needed for dry skin.   repaglinide (PRANDIN) 2 MG tablet TAKE 1 TABLET BY MOUTH THREE TIMES DAILY BEFORE MEAL(S)   rosuvastatin (CRESTOR) 5 MG tablet Take 1 tablet (5 mg total) by mouth daily.   spironolactone (ALDACTONE) 25 MG tablet Take 1 tablet (25 mg total) by mouth daily.   tetrahydrozoline 0.05 % ophthalmic solution Place 1-2 drops into both eyes 3 (three)  times daily as needed (for dry/irritated eyes).   Triamcinolone Acetonide (NASACORT ALLERGY 24HR NA) Place 2 sprays into the nose at bedtime as needed (allergies).    [DISCONTINUED] nebivolol (BYSTOLIC) 5 MG tablet Take 1 tablet (5 mg total) by mouth 2 (two) times daily.   No facility-administered encounter medications on file as of 05/26/2021.    Allergies (verified) Metformin and related, Benicar hct [olmesartan medoxomil-hctz], Bystolic [nebivolol hcl], Dapsone, Lovaza [omega-3-acid ethyl esters], Niaspan [niacin er], Peanut-containing drug products, and Septra [bactrim]   History: Past Medical History:  Diagnosis Date   Coronary artery disease    a. prior LAD stenting 2000; b. [place] 2003 LAD 40, D1 50; c. Lexiscan 02/2017: large defect of severe severity in the basal inferoseptal, basal inferior, mid inferoseptal, mid inferior and apical inferior location, high risk; d. LHC 03/29/17: LM nl, patent LAD stent w/ 40% ISR, D1 100,  OM1 90, RPDA 100 w/ L-R collats, post atrio 80% s/p PCI/DES     Diabetes mellitus    Hyperlipidemia    Hypertension    Ischemic cardiomyopathy    a. TTE 02/2017: EF 30-35%, diffuse HK, normal LV diastolic function parameters, mild AI/MR, RV systolic function normal, PASP normal   Lung nodule    a. s/p prior thoracotomy   Morbid obesity ([place])    Wegener's granulomatosis 2007   Past Surgical History:  Procedure Laterality Date   BASAL CELL CARCINOMA EXCISION     removed from face x 3   [place]   stent in heart   CORONARY ANGIOPLASTY  09/22/1998   s/p stent placement; [facility] 3.0 x 18 mm Ref [id number]   CORONARY STENT INTERVENTION N/A 03/29/2017   Procedure: CORONARY STENT INTERVENTION;  Surgeon: Wellington Hampshire, MD;  Location: [place] [facility];  Service: Cardiovascular;  Laterality: N/A;   LEFT HEART CATH AND CORONARY ANGIOGRAPHY N/A 03/29/2017   Procedure: LEFT HEART CATH AND CORONARY ANGIOGRAPHY;  Surgeon:  Wellington Hampshire, MD;  Location: [place] [facility];  Service: Cardiovascular;  Laterality: N/A;   LUNG SURGERY     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   ULTRASOUND GUIDANCE FOR VASCULAR ACCESS  03/29/2017   Procedure: Ultrasound Guidance For Vascular Access;  Surgeon: Wellington Hampshire, MD;  Location: [place] [facility];  Service: Cardiovascular;;   Family History  Problem Relation Age of Onset   Cancer Mother        tumor on tonsil   Stroke Maternal Grandfather    Diabetes Neg Hx    Social History   Socioeconomic History   Marital status: Married    Spouse name: Not on file   Number of children: Not on file   Years of education: Not on file   Highest education level: Not on file  Occupational History   Not on file  Tobacco Use   Smoking status: Former    Packs/day: 0.50    Years: 20.00    Pack years: 10.00    Types: Cigarettes    Quit date: 05/30/1978  Years since quitting: 43.0   Smokeless tobacco: Never  Vaping Use   Vaping Use: Never used  Substance and Sexual Activity   Alcohol use: No    Alcohol/week: 0.0 standard drinks   Drug use: No   Sexual activity: Not Currently  Other Topics Concern   Not on file  Social History Narrative   Regular exercise: yes   Caffeine use: very little   Social Determinants of Health   Financial Resource Strain: Low Risk    Difficulty of Paying Living Expenses: Not hard at all  Food Insecurity: Not on file  Transportation Needs: No Transportation Needs   Lack of Transportation (Medical): No   Lack of Transportation (Non-Medical): No  Physical Activity: Inactive   Days of Exercise per Week: 0 days   Minutes of Exercise per Session: 0 min  Stress: No Stress Concern Present   Feeling of Stress : Not at all  Social Connections: Moderately Isolated   Frequency of Communication with Friends and Family: More than three times a week   Frequency of Social Gatherings with Friends and Family: Three times a week   Attends  Religious Services: Never   Active Member of Clubs or Organizations: No   Attends Music therapist: Never   Marital Status: Married    Tobacco Counseling Counseling given: Not Answered   Clinical Intake:  Pre-visit preparation completed: Yes  Pain : No/denies pain     BMI - recorded: 32.14 Nutritional Status: BMI > 30  Obese Nutritional Risks: None Diabetes: Yes CBG done?: No Did pt. bring in CBG monitor from home?: No  How often do you need to have someone help you when you read instructions, pamphlets, or other written materials from your doctor or pharmacy?: 1 - Never Diabetes:  Is the patient diabetic?  Yes  If diabetic, was a CBG obtained today?  No  Did the patient bring in their glucometer from home?  No  How often do you monitor your CBG's? daily.   Financial Strains and Diabetes Management:  Are you having any financial strains with the device, your supplies or your medication? No .  Does the patient want to be seen by Chronic Care Management for management of their diabetes?  No  Would the patient like to be referred to a Nutritionist or for Diabetic Management?  No   Diabetic Exams:  Diabetic Eye Exam: Completed 03/31/21.   Diabetic Foot Exam: Completed 11/25/20.   Interpreter Needed?: No  Information entered by :: Orrin Brigham LPN   Activities of Daily Living In your present state of health, do you have any difficulty performing the following activities: 05/26/2021  Hearing? N  Vision? N  Difficulty concentrating or making decisions? N  Walking or climbing stairs? N  Dressing or bathing? N  Doing errands, shopping? N  Preparing Food and eating ? N  Using the Toilet? N  In the past six months, have you accidently leaked urine? N  Do you have problems with loss of bowel control? N  Managing your Medications? N  Managing your Finances? N  Housekeeping or managing your Housekeeping? N  Some recent data might be hidden    Patient  Care Team: Tower, Wynelle Fanny, MD as PCP - General (Family Medicine) Rockey Situ, Kathlene November, MD as Consulting Physician (Cardiology) Oneta Rack, MD as Consulting Physician (Dermatology) Juanito Doom, MD as Consulting Physician (Pulmonary Disease) Debbora Dus, [facility] as Pharmacist (Pharmacist)  Indicate any recent Medical Services you may have  received from other than [facility] providers in the past year (date may be approximate).     Assessment:   This is a routine wellness examination for Ryan Morgan.  Hearing/Vision screen Hearing Screening - Comments:: No issues Vision Screening - Comments:: Last exam 03/31/21, Dr. Lu Duffel  Dietary issues and exercise activities discussed: Current Exercise Habits: The patient does not participate in regular exercise at present   Goals Addressed             This Visit's Progress    Patient Stated       Would like to walk more       Depression Screen PHQ 2/9 Scores 05/26/2021 12/03/2019 11/23/2018 11/20/2017 11/15/2016 10/09/2015 06/09/2014  PHQ - 2 Score 0 0 0 0 0 0 0  PHQ- 9 Score - 0 0 0 - - -    Fall Risk Fall Risk  05/26/2021 12/03/2019 11/23/2018 11/20/2017 11/15/2016  Falls in the past year? 0 1 0 No No  Comment - tripped and fell outside - - -  Number falls in past yr: 0 0 - - -  Injury with Fall? 0 1 - - -  Comment - head injury - - -  Risk for fall due to : No Fall Risks Medication side effect - - -  Follow up Falls prevention discussed Falls evaluation completed;Falls prevention discussed - - -    FALL RISK PREVENTION PERTAINING TO THE HOME:  Any stairs in or around the home? Yes  If so, are there any without handrails? No  Home free of loose throw rugs in walkways, pet beds, electrical cords, etc? Yes  Adequate lighting in your home to reduce risk of falls? Yes   ASSISTIVE DEVICES UTILIZED TO PREVENT FALLS:  Life alert? No  Use of a cane, walker or w/c? No  Grab bars in the bathroom? No  Shower chair or bench in shower? No   Elevated toilet seat or a handicapped toilet? Yes   TIMED UP AND GO:  Was the test performed? No .    Cognitive Function: Normal cognitive status assessed by this Nurse Health Advisor. No abnormalities found.   MMSE - Mini Mental State Exam 12/03/2019 11/23/2018 11/20/2017 11/15/2016 10/09/2015  Orientation to time 5 5 5 5 5   Orientation to Place 5 5 5 5 5   Registration 3 3 3 3 3   Attention/ Calculation 5 0 0 0 0  Recall 3 3 2 3 3   Recall-comments - - unable to recall 1 of 3 words - -  Language- name 2 objects - 0 0 0 0  Language- repeat 1 1 1 1 1   Language- follow 3 step command - 0 3 3 3   Language- read & follow direction - 0 0 0 0  Write a sentence - 0 0 0 0  Copy design - 0 0 0 0  Total score - 17 19 20 20         Immunizations Immunization History  Administered Date(s) Administered   Fluad Quad(high Dose 65+) 03/13/2019, 03/15/2021   Influenza Split 05/16/2011, 02/16/2012   Influenza, High Dose Seasonal PF 02/28/2017   Influenza,inj,Quad PF,6+ Mos 03/05/2013, 03/10/2014, 05/20/2015, 03/30/2016, 05/10/2018   PFIZER(Purple Top)SARS-COV-2 Vaccination 07/12/2019, 08/02/2019, 01/17/2020   Pneumococcal Conjugate-13 06/09/2014   Pneumococcal Polysaccharide-23 07/15/2011   Td 11/29/2017    TDAP status: Up to date  Flu Vaccine status: Up to date  Pneumococcal vaccine status: Up to date  Covid-19 vaccine status: Declined, Education has been provided regarding the importance of  this vaccine but patient still declined. Advised may receive this vaccine at local pharmacy or Health Dept.or vaccine clinic. Aware to provide a copy of the vaccination record if obtained from local pharmacy or Health Dept. Verbalized acceptance and understanding.  Qualifies for Shingles Vaccine? Yes   Zostavax completed No   Shingrix Completed?: No.    Education has been provided regarding the importance of this vaccine. Patient has been advised to call insurance company to determine out of pocket  expense if they have not yet received this vaccine. Advised may also receive vaccine at local pharmacy or Health Dept. Verbalized acceptance and understanding.  Screening Tests Health Maintenance  Topic Date Due   Zoster Vaccines- Shingrix (1 of 2) Never done   COVID-19 Vaccine (4 - Booster for Pfizer series) 03/13/2020   HEMOGLOBIN A1C  05/27/2021   FOOT EXAM  11/25/2021   OPHTHALMOLOGY EXAM  03/31/2022   TETANUS/TDAP  11/30/2027   Pneumonia Vaccine 91+ Years old  Completed   INFLUENZA VACCINE  Completed   HPV VACCINES  Aged Out   Hepatitis C Screening  Discontinued    Health Maintenance  Health Maintenance Due  Topic Date Due   Zoster Vaccines- Shingrix (1 of 2) Never done   COVID-19 Vaccine (4 - Booster for Pfizer series) 03/13/2020    Colorectal cancer screening: No longer required.   Lung Cancer Screening: (Low Dose CT Chest recommended if Age 25-80 years, 30 pack-year currently smoking OR have quit w/in 15years.) does not qualify.     Additional Screening:  Hepatitis C Screening: does not qualify  Vision Screening: Recommended annual ophthalmology exams for early detection of glaucoma and other disorders of the eye. Is the patient up to date with their annual eye exam?  Yes  Who is the provider or what is the name of the office in which the patient attends annual eye exams? Dr. Lu Duffel  Dental Screening: Recommended annual dental exams for proper oral hygiene  Community Resource Referral / Chronic Care Management: CRR required this visit?  No   CCM required this visit?  No      Plan:     I have personally reviewed and noted the following in the patients chart:   Medical and social history Use of alcohol, tobacco or illicit drugs  Current medications and supplements including opioid prescriptions. Patient is not currently taking opioid prescriptions. Functional ability and status Nutritional status Physical activity Advanced directives List of  other physicians Hospitalizations, surgeries, and ER visits in previous 12 months Vitals Screenings to include cognitive, depression, and falls Referrals and appointments  In addition, I have reviewed and discussed with patient certain preventive protocols, quality metrics, and best practice recommendations. A written personalized care plan for preventive services as well as general preventive health recommendations were provided to patient.   Due to this being a telephonic visit, the after visit summary with patients personalized plan was offered to patient via mail or my-chart. Patient would like to access on my-chart.     Loma Messing, LPN   [id number]   Nurse Health Advisor  Nurse Notes: none

## 2021-05-26 ENCOUNTER — Encounter: Payer: Self-pay | Admitting: Gastroenterology

## 2021-05-26 ENCOUNTER — Ambulatory Visit (INDEPENDENT_AMBULATORY_CARE_PROVIDER_SITE_OTHER): Payer: Medicare HMO

## 2021-05-26 VITALS — Ht 70.0 in | Wt 224.0 lb

## 2021-05-26 DIAGNOSIS — Z0000 Encounter for general adult medical examination without abnormal findings: Secondary | ICD-10-CM

## 2021-05-26 NOTE — Patient Instructions (Signed)
[name] , Thank you for taking time to complete your Medicare Wellness Visit. I appreciate your ongoing commitment to your health goals. Please review the following plan we discussed and let me know if I can assist you in the future.   Screening recommendations/referrals: Colonoscopy: no longer required  Recommended yearly ophthalmology/optometry visit for glaucoma screening and checkup Recommended yearly dental visit for hygiene and checkup  Vaccinations: Influenza vaccine: up to date Pneumococcal vaccine: up to date Tdap vaccine: up to date Shingles vaccine: Discuss with pharmacy if interested   Covid-19: Discuss with pharmacy if interested    Advanced directives: information available at your next appointment  Conditions/risks identified: see problem list  Next appointment: Follow up in one year for your annual wellness visit.   Preventive Care 78 Years and Older, Male Preventive care refers to lifestyle choices and visits with your health care provider that can promote health and wellness. What does preventive care include? A yearly physical exam. This is also called an annual well check. Dental exams once or twice a year. Routine eye exams. Ask your health care provider how often you should have your eyes checked. Personal lifestyle choices, including: Daily care of your teeth and gums. Regular physical activity. Eating a healthy diet. Avoiding tobacco and drug use. Limiting alcohol use. Practicing safe sex. Taking low doses of aspirin every day. Taking vitamin and mineral supplements as recommended by your health care provider. What happens during an annual well check? The services and screenings done by your health care provider during your annual well check will depend on your age, overall health, lifestyle risk factors, and family history of disease. Counseling  Your health care provider may ask you questions about your: Alcohol use. Tobacco use. Drug  use. Emotional well-being. Home and relationship well-being. Sexual activity. Eating habits. History of falls. Memory and ability to understand (cognition). Work and work Statistician. Screening  You may have the following tests or measurements: Height, weight, and BMI. Blood pressure. Lipid and cholesterol levels. These may be checked every 5 years, or more frequently if you are over 41 years old. Skin check. Lung cancer screening. You may have this screening every year starting at age 51 if you have a 30-pack-year history of smoking and currently smoke or have quit within the past 15 years. Fecal occult blood test (FOBT) of the stool. You may have this test every year starting at age 89. Flexible sigmoidoscopy or colonoscopy. You may have a sigmoidoscopy every 5 years or a colonoscopy every 10 years starting at age 64. Prostate cancer screening. Recommendations will vary depending on your family history and other risks. Hepatitis C blood test. Hepatitis B blood test. Sexually transmitted disease (STD) testing. Diabetes screening. This is done by checking your blood sugar (glucose) after you have not eaten for a while (fasting). You may have this done every 1-3 years. Abdominal aortic aneurysm (AAA) screening. You may need this if you are a current or former smoker. Osteoporosis. You may be screened starting at age 53 if you are at high risk. Talk with your health care provider about your test results, treatment options, and if necessary, the need for more tests. Vaccines  Your health care provider may recommend certain vaccines, such as: Influenza vaccine. This is recommended every year. Tetanus, diphtheria, and acellular pertussis (Tdap, Td) vaccine. You may need a Td booster every 10 years. Zoster vaccine. You may need this after age 37. Pneumococcal 13-valent conjugate (PCV13) vaccine. One dose is recommended after  age 19. Pneumococcal polysaccharide (PPSV23) vaccine. One dose is  recommended after age 58. Talk to your health care provider about which screenings and vaccines you need and how often you need them. This information is not intended to replace advice given to you by your health care provider. Make sure you discuss any questions you have with your health care provider. Document Released: 06/12/2015 Document Revised: 02/03/2016 Document Reviewed: 03/17/2015 Elsevier Interactive Patient Education  2017 [place] Prevention in the Home Falls can cause injuries. They can happen to people of all ages. There are many things you can do to make your home safe and to help prevent falls. What can I do on the outside of my home? Regularly fix the edges of walkways and driveways and fix any cracks. Remove anything that might make you trip as you walk through a door, such as a raised step or threshold. Trim any bushes or trees on the path to your home. Use bright outdoor lighting. Clear any walking paths of anything that might make someone trip, such as rocks or tools. Regularly check to see if handrails are loose or broken. Make sure that both sides of any steps have handrails. Any raised decks and porches should have guardrails on the edges. Have any leaves, snow, or ice cleared regularly. Use sand or salt on walking paths during winter. Clean up any spills in your garage right away. This includes oil or grease spills. What can I do in the bathroom? Use night lights. Install grab bars by the toilet and in the tub and shower. Do not use towel bars as grab bars. Use non-skid mats or decals in the tub or shower. If you need to sit down in the shower, use a plastic, non-slip stool. Keep the floor dry. Clean up any water that spills on the floor as soon as it happens. Remove soap buildup in the tub or shower regularly. Attach bath mats securely with double-sided non-slip rug tape. Do not have throw rugs and other things on the floor that can make you  trip. What can I do in the bedroom? Use night lights. Make sure that you have a light by your bed that is easy to reach. Do not use any sheets or blankets that are too big for your bed. They should not hang down onto the floor. Have a firm chair that has side arms. You can use this for support while you get dressed. Do not have throw rugs and other things on the floor that can make you trip. What can I do in the kitchen? Clean up any spills right away. Avoid walking on wet floors. Keep items that you use a lot in easy-to-reach places. If you need to reach something above you, use a strong step stool that has a grab bar. Keep electrical cords out of the way. Do not use floor polish or wax that makes floors slippery. If you must use wax, use non-skid floor wax. Do not have throw rugs and other things on the floor that can make you trip. What can I do with my stairs? Do not leave any items on the stairs. Make sure that there are handrails on both sides of the stairs and use them. Fix handrails that are broken or loose. Make sure that handrails are as long as the stairways. Check any carpeting to make sure that it is firmly attached to the stairs. Fix any carpet that is loose or worn. Avoid having throw rugs  at the top or bottom of the stairs. If you do have throw rugs, attach them to the floor with carpet tape. Make sure that you have a light switch at the top of the stairs and the bottom of the stairs. If you do not have them, ask someone to add them for you. What else can I do to help prevent falls? Wear shoes that: Do not have high heels. Have rubber bottoms. Are comfortable and fit you well. Are closed at the toe. Do not wear sandals. If you use a stepladder: Make sure that it is fully opened. Do not climb a closed stepladder. Make sure that both sides of the stepladder are locked into place. Ask someone to hold it for you, if possible. Clearly mark and make sure that you can  see: Any grab bars or handrails. First and last steps. Where the edge of each step is. Use tools that help you move around (mobility aids) if they are needed. These include: Canes. Walkers. Scooters. Crutches. Turn on the lights when you go into a dark area. Replace any light bulbs as soon as they burn out. Set up your furniture so you have a clear path. Avoid moving your furniture around. If any of your floors are uneven, fix them. If there are any pets around you, be aware of where they are. Review your medicines with your doctor. Some medicines can make you feel dizzy. This can increase your chance of falling. Ask your doctor what other things that you can do to help prevent falls. This information is not intended to replace advice given to you by your health care provider. Make sure you discuss any questions you have with your health care provider. Document Released: 03/12/2009 Document Revised: 10/22/2015 Document Reviewed: 06/20/2014 Elsevier Interactive Patient Education  2017 [employer].

## 2021-05-28 NOTE — Telephone Encounter (Signed)
Multiple attempts to reach out to the patient. Unsuccessful outreach to discuss rosuvastatin adherence  Avel Sensor, CMA

## 2021-06-04 ENCOUNTER — Other Ambulatory Visit: Payer: Self-pay | Admitting: Endocrinology

## 2021-06-10 DIAGNOSIS — D2271 Melanocytic nevi of right lower limb, including hip: Secondary | ICD-10-CM | POA: Diagnosis not present

## 2021-06-10 DIAGNOSIS — D225 Melanocytic nevi of trunk: Secondary | ICD-10-CM | POA: Diagnosis not present

## 2021-06-10 DIAGNOSIS — Z85828 Personal history of other malignant neoplasm of skin: Secondary | ICD-10-CM | POA: Diagnosis not present

## 2021-06-10 DIAGNOSIS — L821 Other seborrheic keratosis: Secondary | ICD-10-CM | POA: Diagnosis not present

## 2021-06-10 DIAGNOSIS — L570 Actinic keratosis: Secondary | ICD-10-CM | POA: Diagnosis not present

## 2021-06-10 DIAGNOSIS — D2272 Melanocytic nevi of left lower limb, including hip: Secondary | ICD-10-CM | POA: Diagnosis not present

## 2021-06-28 ENCOUNTER — Encounter: Payer: Self-pay | Admitting: Endocrinology

## 2021-06-28 ENCOUNTER — Telehealth: Payer: Self-pay

## 2021-06-28 ENCOUNTER — Other Ambulatory Visit: Payer: Self-pay

## 2021-06-28 ENCOUNTER — Ambulatory Visit: Payer: Medicare HMO | Admitting: Endocrinology

## 2021-06-28 ENCOUNTER — Ambulatory Visit: Payer: Medicare HMO | Admitting: Gastroenterology

## 2021-06-28 ENCOUNTER — Encounter: Payer: Self-pay | Admitting: Gastroenterology

## 2021-06-28 VITALS — BP 134/84 | HR 74 | Ht 70.0 in | Wt 230.6 lb

## 2021-06-28 VITALS — BP 126/68 | HR 84 | Ht 70.5 in | Wt 230.0 lb

## 2021-06-28 DIAGNOSIS — Z86010 Personal history of colonic polyps: Secondary | ICD-10-CM

## 2021-06-28 DIAGNOSIS — E11311 Type 2 diabetes mellitus with unspecified diabetic retinopathy with macular edema: Secondary | ICD-10-CM

## 2021-06-28 LAB — POCT GLYCOSYLATED HEMOGLOBIN (HGB A1C): Hemoglobin A1C: 7 % — AB (ref 4.0–5.6)

## 2021-06-28 MED ORDER — ACARBOSE 25 MG PO TABS: 25.0000 mg | ORAL_TABLET | Freq: Three times a day (TID) | ORAL | 3 refills | Status: DC

## 2021-06-28 MED ORDER — PLENVU 140 G PO SOLR
1.0000 | Freq: Once | ORAL | 0 refills | Status: AC
Start: 2021-06-28 — End: 2021-06-28

## 2021-06-28 MED ORDER — METFORMIN HCL ER 500 MG PO TB24: 500.0000 mg | ORAL_TABLET | Freq: Every day | ORAL | 3 refills | Status: DC

## 2021-06-28 MED ORDER — REPAGLINIDE 2 MG PO TABS: 2.0000 mg | ORAL_TABLET | Freq: Three times a day (TID) | ORAL | 3 refills | Status: DC

## 2021-06-28 NOTE — Patient Instructions (Signed)
Please continue the same diabetes medications.   check your blood sugar once a day.  vary the time of day when you check, between before the 3 meals, and at bedtime.  also check if you have symptoms of your blood sugar being too high or too low.  please keep a record of the readings and bring it to your next appointment here (or you can bring the meter itself).  You can write it on any piece of paper.  please call us sooner if your blood sugar goes below 70, or if you have a lot of readings over 200.   Please come back for a follow-up appointment in 7 months.

## 2021-06-28 NOTE — Telephone Encounter (Signed)
[facility] Pre-operative Risk Assessment     Request for surgical clearance:     Endoscopy Procedure  What type of surgery is being performed?     COLONOSCOPY  When is this surgery scheduled?     08-04-2021  What type of clearance is required ?   Pharmacy  Are there any medications that need to be held prior to surgery and how long? PLAVIX 5 DAYS  Practice name and name of physician performing surgery?      [facility] DR Pat Kocher  What is your office phone and fax number?      Phone- [phone]  Fax- [phone] ATTN: Tia Alert, CMA  Anesthesia type (None, local, MAC, general) ?       MAC   THANK YOU

## 2021-06-28 NOTE — Progress Notes (Signed)
Ryan Morgan    [id number]    [birth year]  Primary Care Physician:Tower, Wynelle Fanny, MD  Referring Physician: Tower, Wynelle Fanny, MD [place],   [id number]   Chief complaint: History of colon polyps  HPI: 81 year old very pleasant gentleman here for new patient visit to discuss surveillance colonoscopy  His last colonoscopy was done at outside hospital, had polyps removed and he was sent for recall colonoscopy later in 2021  Records are not available during this visit to review.  He has history of adenomatous colon polyps  Other relevant medical history includes hypertension, hyperlipidemia, type 2 diabetes, Wegener's granulomatosis, CAD.  He is on chronic antiplatelet therapy, Plavix  Denies any nausea, vomiting, abdominal pain, melena or bright red blood per rectum  No family history of GI malignancy  Outpatient Encounter Medications as of 06/28/2021  Medication Sig   acarbose (PRECOSE) 25 MG tablet Take 1 tablet (25 mg total) by mouth 3 (three) times daily with meals.   carvedilol (COREG) 6.25 MG tablet Take 1 tablet (6.25 mg total) by mouth 2 (two) times daily with a meal.   cetirizine (ZYRTEC) 10 MG tablet Take 10 mg by mouth 2 (two) times daily.    cholecalciferol (VITAMIN D3) 25 MCG (1000 UNIT) tablet Take 3,000 Units by mouth daily.   clobetasol cream (TEMOVATE) 6.27 % Apply 1 application topically 2 (two) times daily. To affected areas   clopidogrel (PLAVIX) 75 MG tablet Take 1 tablet (75 mg total) by mouth daily.   ENTRESTO 49-51 MG Take 1 tablet by mouth twice daily   ezetimibe (ZETIA) 10 MG tablet TAKE 1 TABLET BY MOUTH ONCE DAILY . APPOINTMENT REQUIRED FOR FUTURE REFILLS   fexofenadine (ALLEGRA) 180 MG tablet Take 180 mg by mouth daily.   gabapentin (NEURONTIN) 100 MG capsule Take 1 capsule (100 mg total) by mouth at bedtime.   glucose blood (ONETOUCH ULTRA) test strip 1 each by Other route in the morning and at bedtime. And lancets  2/day   LANCETS ULTRA THIN 30G MISC by Does not apply route. One Touch   metFORMIN (GLUCOPHAGE-XR) 500 MG 24 hr tablet Take 1 tablet (500 mg total) by mouth daily with breakfast.   Multiple Vitamin (MULTIVITAMIN WITH MINERALS) TABS tablet Take 1 tablet by mouth daily.   Multiple Vitamins-Minerals (AIRBORNE) TBEF Take 1 tablet by mouth daily as needed (for immune system support). Immune Health/Support   nystatin cream (MYCOSTATIN) Apply 1 application topically 2 (two) times daily as needed for dry skin.   repaglinide (PRANDIN) 2 MG tablet Take 1 tablet (2 mg total) by mouth 3 (three) times daily before meals.   rosuvastatin (CRESTOR) 5 MG tablet Take 1 tablet (5 mg total) by mouth daily.   spironolactone (ALDACTONE) 25 MG tablet Take 1 tablet (25 mg total) by mouth daily.   tetrahydrozoline 0.05 % ophthalmic solution Place 1-2 drops into both eyes 3 (three) times daily as needed (for dry/irritated eyes).   Triamcinolone Acetonide (NASACORT ALLERGY 24HR NA) Place 2 sprays into the nose at bedtime as needed (allergies).    [DISCONTINUED] nebivolol (BYSTOLIC) 5 MG tablet Take 1 tablet (5 mg total) by mouth 2 (two) times daily.   No facility-administered encounter medications on file as of 06/28/2021.    Allergies as of 06/28/2021 - Review Complete 06/28/2021  Allergen Reaction Noted   Metformin and related Itching 06/01/2017   Benicar hct [olmesartan medoxomil-hctz] Itching 03/50/0938   Bystolic [  nebivolol hcl] Other (See Comments) 08/16/2011   Dapsone Hives 05/03/2018   Lovaza [omega-3-acid ethyl esters] Other (See Comments) 03/24/2017   Niaspan [niacin er] Other (See Comments) 08/30/2010   Peanut-containing drug products  12/04/2018   Septra [bactrim] Hives 08/30/2010    Past Medical History:  Diagnosis Date   Coronary artery disease    a. prior LAD stenting 2000; b. [place] 2003 LAD 40, D1 50; c. Lexiscan 02/2017: large defect of severe severity in the basal inferoseptal, basal inferior, mid  inferoseptal, mid inferior and apical inferior location, high risk; d. LHC 03/29/17: LM nl, patent LAD stent w/ 40% ISR, D1 100,  OM1 90, RPDA 100 w/ L-R collats, post atrio 80% s/p PCI/DES     Diabetes mellitus    Hyperlipidemia    Hypertension    Ischemic cardiomyopathy    a. TTE 02/2017: EF 30-35%, diffuse HK, normal LV diastolic function parameters, mild AI/MR, RV systolic function normal, PASP normal   Lung nodule    a. s/p prior thoracotomy   Morbid obesity ([place])    Wegener's granulomatosis 2007    Past Surgical History:  Procedure Laterality Date   BASAL CELL CARCINOMA EXCISION     removed from face x 3   [place]   stent in heart   CORONARY ANGIOPLASTY  09/22/1998   s/p stent placement; [facility] 3.0 x 18 mm Ref [id number]   CORONARY STENT INTERVENTION N/A 03/29/2017   Procedure: CORONARY STENT INTERVENTION;  Surgeon: Wellington Hampshire, MD;  Location: [place] [facility];  Service: Cardiovascular;  Laterality: N/A;   LEFT HEART CATH AND CORONARY ANGIOGRAPHY N/A 03/29/2017   Procedure: LEFT HEART CATH AND CORONARY ANGIOGRAPHY;  Surgeon: Wellington Hampshire, MD;  Location: [place] [facility];  Service: Cardiovascular;  Laterality: N/A;   LUNG SURGERY     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   ULTRASOUND GUIDANCE FOR VASCULAR ACCESS  03/29/2017   Procedure: Ultrasound Guidance For Vascular Access;  Surgeon: Wellington Hampshire, MD;  Location: [facility];  Service: Cardiovascular;;    Family History  Problem Relation Age of Onset   Cancer Mother        tumor on tonsil   Stroke Maternal Grandfather    Diabetes Neg Hx     Social History   Socioeconomic History   Marital status: Married    Spouse name: Not on file   Number of children: Not on file   Years of education: Not on file   Highest education level: Not on file  Occupational History   Not on file  Tobacco Use   Smoking status: Former    Packs/day: 0.50    Years:  20.00    Pack years: 10.00    Types: Cigarettes    Quit date: 05/30/1978    Years since quitting: 43.1   Smokeless tobacco: Never  Vaping Use   Vaping Use: Never used  Substance and Sexual Activity   Alcohol use: No    Alcohol/week: 0.0 standard drinks   Drug use: No   Sexual activity: Not Currently  Other Topics Concern   Not on file  Social History Narrative   Regular exercise: yes   Caffeine use: very little   Social Determinants of Health   Financial Resource Strain: Low Risk    Difficulty of Paying Living Expenses: Not hard at all  Food Insecurity: Not on file  Transportation Needs: No Transportation Needs   Lack  of Transportation (Medical): No   Lack of Transportation (Non-Medical): No  Physical Activity: Inactive   Days of Exercise per Week: 0 days   Minutes of Exercise per Session: 0 min  Stress: No Stress Concern Present   Feeling of Stress : Not at all  Social Connections: Moderately Isolated   Frequency of Communication with Friends and Family: More than three times a week   Frequency of Social Gatherings with Friends and Family: Three times a week   Attends Religious Services: Never   Active Member of Clubs or Organizations: No   Attends Archivist Meetings: Never   Marital Status: Married  Human resources officer Violence: Not At Risk   Fear of Current or Ex-Partner: No   Emotionally Abused: No   Physically Abused: No   Sexually Abused: No      Review of systems: All other review of systems negative except as mentioned in the HPI.   Physical Exam: Vitals:   06/28/21 1400  BP: 126/68  Pulse: 84   Body mass index is 32.54 kg/m. Gen:      No acute distress HEENT:  sclera anicteric Abd:      soft, non-tender; no palpable masses, no distension Ext:    No edema Neuro: alert and oriented x 3 Psych: normal mood and affect  Data Reviewed:  Reviewed labs, radiology imaging, old records and pertinent past GI work up   Assessment and  Plan/Recommendations:  81 year old very pleasant gentleman with history of adenomatous colon polyps here to discuss surveillance colonoscopy  Schedule for surveillance colonoscopy The risks and benefits as well as alternatives of endoscopic procedure(s) have been discussed and reviewed. All questions answered. The patient agrees to proceed.  We will request clearance from cardiology to hold Plavix for 5 days prior to the procedure  Will try to obtain records of prior colonoscopy and pathology from his previous gastroenterology office  Return after colonoscopy as needed  The patient was provided an opportunity to ask questions and all were answered. The patient agreed with the plan and demonstrated an understanding of the instructions.  Damaris Hippo , MD    CC: Tower, Wynelle Fanny, MD

## 2021-06-28 NOTE — Telephone Encounter (Signed)
Ryan Morgan 81 year old male is requesting preoperative cardiac evaluation for colonoscopy.  He was last seen in the clinic on 04/16/2021.  During that time he denied chest pain shortness of breath.  He reported that he is back to his baseline activity.  His PMH includes coronary artery disease status post stenting to his LAD in 2000, repeat catheterization 1/03 which showed 40% LAD, 50% diagonal, PMH also includes Wegener's granulomatosis, on chronic steroids, and diabetes.   May his Plavix be held prior to his procedure?  Please direct your response to CV DIV preop pool.  Thank you for your help.  Jossie Ng. Marguerite Barba NP-C    06/28/2021, 4:09 PM [facility] [place] [address] Office [phone] Fax [phone]

## 2021-06-28 NOTE — Patient Instructions (Signed)
If you are age 79 or older, your body mass index should be between 23-30. Your Body mass index is 32.54 kg/m. If this is out of the aforementioned range listed, please consider follow up with your Primary Care Provider.  If you are age 61 or younger, your body mass index should be between 19-25. Your Body mass index is 32.54 kg/m. If this is out of the aformentioned range listed, please consider follow up with your Primary Care Provider.   ________________________________________________________  The [facility] providers would like to encourage you to use [facility] to communicate with providers for non-urgent requests or questions.  Due to long hold times on the telephone, sending your provider a message by [facility] may be a faster and more efficient way to get a response.  Please allow 48 business hours for a response.  Please remember that this is for non-urgent requests.  _______________________________________________________  Ryan Morgan have been scheduled for a colonoscopy. Please follow written instructions given to you at your visit today.  Please pick up your prep supplies at the pharmacy within the next 1-3 days. If you use inhalers (even only as needed), please bring them with you on the day of your procedure.   You will be contacted by our office prior to your procedure for directions on holding your Plavix.  If you do not hear from our office 1 week prior to your scheduled procedure, please call [phone] to discuss.  I appreciate the opportunity to care for you. Thank you for choosing me and [facility],  Dr. Harl Bowie

## 2021-06-28 NOTE — Progress Notes (Signed)
Subjective:    Patient ID: Ryan Morgan, male    DOB: Apr 05, 1941, 81 y.o.   MRN: [id number]  HPI Pt returns for f/u of diabetes mellitus:  DM type: 2 Dx'ed: 9381 Complications: PN, DR, and CAD.   Therapy: 3 oral meds DKA: never Severe hypoglycemia: never.   Pancreatitis: never Pancreatic imaging: never SDOH: he cannot afford brand name meds.   Other: he takes intermittent prednisone for Wegener's granulomatosis.   Interval history: last steroid rx was 11/22.  Meter is downloaded today, and the printout is scanned into the record.  cbg varies from 78-159.  He takes meds as rx'ed.  pt states he feels well in general. Past Medical History:  Diagnosis Date   Coronary artery disease    a. prior LAD stenting 2000; b. [place] 2003 LAD 40, D1 50; c. Lexiscan 02/2017: large defect of severe severity in the basal inferoseptal, basal inferior, mid inferoseptal, mid inferior and apical inferior location, high risk; d. LHC 03/29/17: LM nl, patent LAD stent w/ 40% ISR, D1 100,  OM1 90, RPDA 100 w/ L-R collats, post atrio 80% s/p PCI/DES     Diabetes mellitus    Hyperlipidemia    Hypertension    Ischemic cardiomyopathy    a. TTE 02/2017: EF 30-35%, diffuse HK, normal LV diastolic function parameters, mild AI/MR, RV systolic function normal, PASP normal   Lung nodule    a. s/p prior thoracotomy   Morbid obesity ([place])    Wegener's granulomatosis 2007    Past Surgical History:  Procedure Laterality Date   BASAL CELL CARCINOMA EXCISION     removed from face x 3   [place]   stent in heart   CORONARY ANGIOPLASTY  09/22/1998   s/p stent placement; [facility] 3.0 x 18 mm Ref [id number]   CORONARY STENT INTERVENTION N/A 03/29/2017   Procedure: CORONARY STENT INTERVENTION;  Surgeon: Wellington Hampshire, MD;  Location: [facility];  Service: Cardiovascular;  Laterality: N/A;   LEFT HEART CATH AND CORONARY ANGIOGRAPHY N/A 03/29/2017   Procedure: LEFT HEART CATH  AND CORONARY ANGIOGRAPHY;  Surgeon: Wellington Hampshire, MD;  Location: [facility];  Service: Cardiovascular;  Laterality: N/A;   LUNG SURGERY     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   ULTRASOUND GUIDANCE FOR VASCULAR ACCESS  03/29/2017   Procedure: Ultrasound Guidance For Vascular Access;  Surgeon: Wellington Hampshire, MD;  Location: [facility];  Service: Cardiovascular;;    Social History   Socioeconomic History   Marital status: Married    Spouse name: Not on file   Number of children: Not on file   Years of education: Not on file   Highest education level: Not on file  Occupational History   Not on file  Tobacco Use   Smoking status: Former    Packs/day: 0.50    Years: 20.00    Pack years: 10.00    Types: Cigarettes    Quit date: 05/30/1978    Years since quitting: 43.1   Smokeless tobacco: Never  Vaping Use   Vaping Use: Never used  Substance and Sexual Activity   Alcohol use: No    Alcohol/week: 0.0 standard drinks   Drug use: No   Sexual activity: Not Currently  Other Topics Concern   Not on file  Social History Narrative   Regular exercise: yes   Caffeine use: very little   Social Determinants of Health  Financial Resource Strain: Low Risk    Difficulty of Paying Living Expenses: Not hard at all  Food Insecurity: Not on file  Transportation Needs: No Transportation Needs   Lack of Transportation (Medical): No   Lack of Transportation (Non-Medical): No  Physical Activity: Inactive   Days of Exercise per Week: 0 days   Minutes of Exercise per Session: 0 min  Stress: No Stress Concern Present   Feeling of Stress : Not at all  Social Connections: Moderately Isolated   Frequency of Communication with Friends and Family: More than three times a week   Frequency of Social Gatherings with Friends and Family: Three times a week   Attends Religious Services: Never   Active Member of Clubs or Organizations: No   Attends Archivist  Meetings: Never   Marital Status: Married  Human resources officer Violence: Not At Risk   Fear of Current or Ex-Partner: No   Emotionally Abused: No   Physically Abused: No   Sexually Abused: No    Current Outpatient Medications on File Prior to Visit  Medication Sig Dispense Refill   carvedilol (COREG) 6.25 MG tablet Take 1 tablet (6.25 mg total) by mouth 2 (two) times daily with a meal. 180 tablet 3   cetirizine (ZYRTEC) 10 MG tablet Take 10 mg by mouth 2 (two) times daily.      cholecalciferol (VITAMIN D3) 25 MCG (1000 UNIT) tablet Take 3,000 Units by mouth daily.     clobetasol cream (TEMOVATE) 6.60 % Apply 1 application topically 2 (two) times daily. To affected areas 30 g 1   clopidogrel (PLAVIX) 75 MG tablet Take 1 tablet (75 mg total) by mouth daily. 90 tablet 3   ENTRESTO 49-51 MG Take 1 tablet by mouth twice daily 180 tablet 2   ezetimibe (ZETIA) 10 MG tablet TAKE 1 TABLET BY MOUTH ONCE DAILY . APPOINTMENT REQUIRED FOR FUTURE REFILLS 90 tablet 3   fexofenadine (ALLEGRA) 180 MG tablet Take 180 mg by mouth daily.     gabapentin (NEURONTIN) 100 MG capsule Take 1 capsule (100 mg total) by mouth at bedtime. 30 capsule 3   glucose blood (ONETOUCH ULTRA) test strip 1 each by Other route in the morning and at bedtime. And lancets 2/day 200 each 3   LANCETS ULTRA THIN 30G MISC by Does not apply route. One Touch     Multiple Vitamin (MULTIVITAMIN WITH MINERALS) TABS tablet Take 1 tablet by mouth daily.     Multiple Vitamins-Minerals (AIRBORNE) TBEF Take 1 tablet by mouth daily as needed (for immune system support). Immune Health/Support     nystatin cream (MYCOSTATIN) Apply 1 application topically 2 (two) times daily as needed for dry skin.     rosuvastatin (CRESTOR) 5 MG tablet Take 1 tablet (5 mg total) by mouth daily. 90 tablet 3   spironolactone (ALDACTONE) 25 MG tablet Take 1 tablet (25 mg total) by mouth daily. 90 tablet 3   tetrahydrozoline 0.05 % ophthalmic solution Place 1-2 drops into  both eyes 3 (three) times daily as needed (for dry/irritated eyes).     Triamcinolone Acetonide (NASACORT ALLERGY 24HR NA) Place 2 sprays into the nose at bedtime as needed (allergies).      [DISCONTINUED] nebivolol (BYSTOLIC) 5 MG tablet Take 1 tablet (5 mg total) by mouth 2 (two) times daily. 180 tablet 3   No current facility-administered medications on file prior to visit.    Allergies  Allergen Reactions   Metformin And Related Itching   Benicar Hct [  Olmesartan Medoxomil-Hctz] Itching    Painful and itchy knots on palms of hands   Bystolic [Nebivolol Hcl] Other (See Comments)    headache   Dapsone Hives   Lovaza [Omega-3-Acid Ethyl Esters] Other (See Comments)    Unknown reaction   Niaspan [Niacin Er] Other (See Comments)    flushing   Peanut-Containing Drug Products     Rash on hands from peanuts   Septra [Bactrim] Hives    Family History  Problem Relation Age of Onset   Cancer Mother        tumor on tonsil   Stroke Maternal Grandfather    Diabetes Neg Hx     BP 134/84 (BP Location: Left Arm, Patient Position: Sitting, Cuff Size: Normal)    Pulse 74    Ht 5\' 10"  (1.778 m)    Wt 230 lb 9.6 oz (104.6 kg)    SpO2 98%    BMI 33.09 kg/m    Review of Systems He denies hypoglycemia.      Objective:   Physical Exam    Lab Results  Component Value Date   HGBA1C 7.0 (A) 06/28/2021   Lab Results  Component Value Date   CREATININE 0.90 12/03/2019   BUN 17 12/03/2019   NA 140 12/03/2019   K 4.3 12/03/2019   CL 104 12/03/2019   CO2 29 12/03/2019       Assessment & Plan:  Type 2 DM: well-controlled  Patient Instructions  Please continue the same diabetes medications.   check your blood sugar once a day.  vary the time of day when you check, between before the 3 meals, and at bedtime.  also check if you have symptoms of your blood sugar being too high or too low.  please keep a record of the readings and bring it to your next appointment here (or you can bring  the meter itself).  You can write it on any piece of paper.  please call us sooner if your blood sugar goes below 70, or if you have a lot of readings over 200.   Please come back for a follow-up appointment in 7 months.

## 2021-07-05 NOTE — Telephone Encounter (Signed)
° ° °  Patient Name: Ryan Morgan  DOB: 03/09/1941 MRN: [id number]  Primary Cardiologist: None  Chart reviewed as part of pre-operative protocol coverage. Patient has upcoming colonoscopy planned and we were asked to give our recommendations for holding Plavix. Per Dr. Rockey Situ: Patient is at acceptable risk for procedure. Can stop Plavix 5 days prior to colonoscopy. When not taking Plavix, would take Aspirin 81 mg daily. Restart Plavix following procedure per guidance from GI physician, likely can start evening of procedure.  I will route this recommendation to the requesting party via Epic fax function and remove from pre-op pool.  Please call with questions.  Darreld Mclean, PA-C 07/05/2021, 11:28 AM

## 2021-07-05 NOTE — Telephone Encounter (Signed)
Called patient and left detailed message that he has been approved by Cardiology to hold Plavix starting on 3-3 for his procedure with Dr. Silverio Decamp on 3-8. He will need to take an 81 mg aspirin on those days.  Asked patient to call back to confirm understanding.

## 2021-07-07 NOTE — Telephone Encounter (Signed)
Called patient and spoke to him. He expressed understanding to hold Plavix from 3-3 under his procedure with Dr. Silverio Decamp.

## 2021-07-08 ENCOUNTER — Other Ambulatory Visit: Payer: Self-pay | Admitting: Cardiovascular Disease

## 2021-07-15 ENCOUNTER — Encounter: Payer: Self-pay | Admitting: Gastroenterology

## 2021-07-29 ENCOUNTER — Telehealth: Payer: Self-pay

## 2021-07-29 NOTE — Progress Notes (Signed)
? ? ?  Chronic Care Management ?Pharmacy Assistant  ? ?Name: Ryan Morgan  MRN: [id number] DOB: [birth year] ? ?Reason for Encounter: CCM Counsellor) ?  ?Medications: ?Outpatient Encounter Medications as of 07/29/2021  ?Medication Sig  ? acarbose (PRECOSE) 25 MG tablet Take 1 tablet (25 mg total) by mouth 3 (three) times daily with meals.  ? carvedilol (COREG) 6.25 MG tablet Take 1 tablet (6.25 mg total) by mouth 2 (two) times daily with a meal.  ? cetirizine (ZYRTEC) 10 MG tablet Take 10 mg by mouth 2 (two) times daily.   ? cholecalciferol (VITAMIN D3) 25 MCG (1000 UNIT) tablet Take 3,000 Units by mouth daily.  ? clobetasol cream (TEMOVATE) 3.71 % Apply 1 application topically 2 (two) times daily. To affected areas  ? clopidogrel (PLAVIX) 75 MG tablet Take 1 tablet (75 mg total) by mouth daily.  ? ENTRESTO 49-51 MG Take 1 tablet by mouth twice daily  ? ezetimibe (ZETIA) 10 MG tablet TAKE 1 TABLET BY MOUTH ONCE DAILY . APPOINTMENT REQUIRED FOR FUTURE REFILLS  ? fexofenadine (ALLEGRA) 180 MG tablet Take 180 mg by mouth daily.  ? gabapentin (NEURONTIN) 100 MG capsule Take 1 capsule (100 mg total) by mouth at bedtime.  ? glucose blood (ONETOUCH ULTRA) test strip 1 each by Other route in the morning and at bedtime. And lancets 2/day  ? LANCETS ULTRA THIN 30G MISC by Does not apply route. One Touch  ? metFORMIN (GLUCOPHAGE-XR) 500 MG 24 hr tablet Take 1 tablet (500 mg total) by mouth daily with breakfast.  ? Multiple Vitamin (MULTIVITAMIN WITH MINERALS) TABS tablet Take 1 tablet by mouth daily.  ? Multiple Vitamins-Minerals (AIRBORNE) TBEF Take 1 tablet by mouth daily as needed (for immune system support). Immune Health/Support  ? nystatin cream (MYCOSTATIN) Apply 1 application topically 2 (two) times daily as needed for dry skin.  ? repaglinide (PRANDIN) 2 MG tablet Take 1 tablet (2 mg total) by mouth 3 (three) times daily before meals.  ? rosuvastatin (CRESTOR) 5 MG tablet Take 1 tablet (5 mg total) by mouth daily.   ? spironolactone (ALDACTONE) 25 MG tablet Take 1 tablet (25 mg total) by mouth daily.  ? tetrahydrozoline 0.05 % ophthalmic solution Place 1-2 drops into both eyes 3 (three) times daily as needed (for dry/irritated eyes).  ? Triamcinolone Acetonide (NASACORT ALLERGY 24HR NA) Place 2 sprays into the nose at bedtime as needed (allergies).   ? [DISCONTINUED] nebivolol (BYSTOLIC) 5 MG tablet Take 1 tablet (5 mg total) by mouth 2 (two) times daily.  ? ?No facility-administered encounter medications on file as of 07/29/2021.  ? ?[name] was contacted to remind of upcoming telephone visit with Charlene Brooke on 08/03/2021 at 11:00 am. Patient was reminded to have all medications, supplements and any blood glucose and blood pressure readings available for review at appointment. If unable to reach, a voicemail was left for patient.  ? ?Are you having any problems with your medications? No  ? ?Do you have any concerns you like to discuss with the pharmacist? No ? ?Star Rating Drugs: ?Medication:    Last Fill: Day Supply ?Metformin 500 mg   06/06/2021 90 ?Rosuvastatin 10 mg   04/01/2021 90 ?Sacubitril-Valsartan 49-51 mg 07/08/2021 90 ? ?Fill dates verified with [facility]  ? ?Charlene Brooke, CPP notified ? ?Marijean Niemann, RMA ?Clinical Pharmacy Assistant ?[phone] ? ?Time Spent: 10 Minutes ? ? ? ? ?

## 2021-08-03 ENCOUNTER — Other Ambulatory Visit: Payer: Self-pay

## 2021-08-03 ENCOUNTER — Ambulatory Visit (INDEPENDENT_AMBULATORY_CARE_PROVIDER_SITE_OTHER): Payer: Medicare HMO | Admitting: Pharmacist

## 2021-08-03 DIAGNOSIS — I10 Essential (primary) hypertension: Secondary | ICD-10-CM

## 2021-08-03 DIAGNOSIS — I5022 Chronic systolic (congestive) heart failure: Secondary | ICD-10-CM

## 2021-08-03 DIAGNOSIS — I25118 Atherosclerotic heart disease of native coronary artery with other forms of angina pectoris: Secondary | ICD-10-CM

## 2021-08-03 DIAGNOSIS — E11311 Type 2 diabetes mellitus with unspecified diabetic retinopathy with macular edema: Secondary | ICD-10-CM

## 2021-08-03 DIAGNOSIS — E1169 Type 2 diabetes mellitus with other specified complication: Secondary | ICD-10-CM

## 2021-08-03 DIAGNOSIS — E785 Hyperlipidemia, unspecified: Secondary | ICD-10-CM

## 2021-08-03 NOTE — Progress Notes (Signed)
Chronic Care Management Pharmacy Note  08/03/2021 Name:  Ryan Morgan MRN:  [id number] DOB:  [birth year]  Summary: CCM F/U visit -HLD, CAD - Patient reports adherence with rosuvastatin 5 mg 2x per week, he seems to be tolerating well. Last LDL 104 (4/022), he is due for repeat lipid panel -CHF - Entresto PAP was denied however he reports his copay has decreased in 2023 and is reasonable -DM - Followed by endo. A1c 7.0% (increased from 6.5%). Cost has prohibited SGLT-2 in the past, he does not qualify for pt assistance based on income  Recommendations/Changes made from today's visit: -No med changes  Plan: -Perrin will call patient 3 months for adherence review -Pharmacist follow up televisit scheduled for 6 months    Subjective: Ryan Morgan is an 81 y.o. year old male who is a primary patient of Tower, Wynelle Fanny, MD.  The CCM team was consulted for assistance with disease management and care coordination needs.    Engaged with patient by telephone for follow up visit in response to provider referral for pharmacy case management and/or care coordination services.   Consent to Services:  The patient was given information about Chronic Care Management services, agreed to services, and gave verbal consent prior to initiation of services.  Please see initial visit note for detailed documentation.   Patient Care Team: Tower, Wynelle Fanny, MD as PCP - General (Family Medicine) Rockey Situ, Kathlene November, MD as Consulting Physician (Cardiology) Oneta Rack, MD as Consulting Physician (Dermatology) Juanito Doom, MD as Consulting Physician (Pulmonary Disease) Charlton Haws, [facility] as Pharmacist (Pharmacist)  Recent office visits:  05/18/21 Dr Glori Bickers VV: covid positive. Rx'd molnupiravir. 03/15/21 Dr Lorelei Pont OV: f/u back pain. Rx'd gabapentin 100 mg HS. 01/06/21 Dr Lorelei Pont OV: acute back pain; Rx'd prednisone burst.   Recent consult visits:  06/28/21 Dr Silverio Decamp (GI):  referral for colonoscopy 06/28/21 Dr Loanne Drilling (endocrine): f/u DM. A1c 7.0. No med changes. 04/16/21 Dr Rockey Situ (cardiology): f/u CAD. Retry low dose statin - rosuvastatin 5 mg   11/25/20 - Endocrinology - Pt presented for DM follow up. No medication changes. Follow up 7 months. 11/17/20 - Nephrology - Pt presented for ANCA vasculitis. No medication changes. Follow up in 1 year. 09/29/20 - Cardiology, telephone call - Medication change stop atorvastatin and change to Rosuvastatin 58m take 1 tablet daily due to muscle aches. 09/23/20 - Cardiology - Recommend he discuss FWilder Gladeand Jardiance with endocrinology. Stop taking aspirin.  Labs ordered   Hospital visits:  None in previous 6 months   Objective:  Lab Results  Component Value Date   CREATININE 0.90 12/03/2019   BUN 17 12/03/2019   GFR 81.75 12/03/2019   GFRNONAA >60 09/12/2019   GFRAA >60 09/12/2019   NA 140 12/03/2019   K 4.3 12/03/2019   CALCIUM 8.7 12/03/2019   CO2 29 12/03/2019   GLUCOSE 171 (H) 12/03/2019    Lab Results  Component Value Date/Time   HGBA1C 7.0 (A) 06/28/2021 10:29 AM   HGBA1C 6.5 (A) 11/25/2020 09:40 AM   HGBA1C 8.4 (H) 11/26/2018 11:27 AM   HGBA1C 7.6 (H) 08/24/2018 09:25 PM   FRUCTOSAMINE 333 (H) 06/15/2017 03:51 PM   GFR 81.75 12/03/2019 09:39 AM   GFR 88.77 11/26/2018 11:27 AM   MICROALBUR 1.8 11/26/2018 11:27 AM   MICROALBUR <0.7 11/20/2017 11:26 AM    Last diabetic Eye exam:  Lab Results  Component Value Date/Time   HMDIABEYEEXA No Retinopathy 03/31/2021 12:00 AM  Last diabetic Foot exam: Followed by endocrinology   Lab Results  Component Value Date   CHOL 185 09/23/2020   HDL 34 (L) 09/23/2020   LDLCALC 104 (H) 09/23/2020   LDLDIRECT 100.0 05/17/2017   TRIG 274 (H) 09/23/2020   CHOLHDL 5.4 (H) 09/23/2020    Hepatic Function Latest Ref Rng & Units 12/03/2019 11/26/2018 11/20/2017  Total Protein 6.0 - 8.3 g/dL 5.8(L) 5.9(L) 6.1  Albumin 3.5 - 5.2 g/dL 3.9 3.9 4.0  AST 0 - 37 U/L _0 ALT 0 - 53 U/L _1 Alk Phosphatase 39 - 117 U/L 62 71 64  Total Bilirubin 0.2 - 1.2 mg/dL 0.6 0.5 0.5    Lab Results  Component Value Date/Time   TSH 1.30 12/03/2019 09:39 AM   TSH 1.115 08/24/2018 03:28 PM   TSH 0.91 11/20/2017 11:26 AM    CBC Latest Ref Rng & Units 12/03/2019 09/12/2019 11/26/2018  WBC 4.0 - 10.5 K/uL 3.5(L) 4.6 3.5(L)  Hemoglobin 13.0 - 17.0 g/dL 12.3(L) 12.4(L) 13.1  Hematocrit 39.0 - 52.0 % 35.2(L) 36.5(L) 38.3(L)  Platelets 150.0 - 400.0 K/uL 151.0 159 175.0    Lab Results  Component Value Date/Time   VD25OH 27.61 (L) 06/02/2014 08:44 AM   Clinical ASCVD: Yes  The 10-year ASCVD risk score (Arnett DK, et al., 2019) is: 58.7%   Values used to calculate the score:     Age: 81 years     Sex: Male     Is Non-Hispanic African American: No     Diabetic: Yes     Tobacco smoker: Yes     Systolic Blood Pressure: 110 mmHg     Is BP treated: Yes     HDL Cholesterol: 34 mg/dL     Total Cholesterol: 185 mg/dL    Depression screen [facility] 2/9 05/26/2021 12/03/2019 11/23/2018  Decreased Interest 0 0 0  Down, Depressed, Hopeless 0 0 0  PHQ - 2 Score 0 0 0  Altered sleeping - 0 0  Tired, decreased energy - 0 0  Change in appetite - 0 0  Feeling bad or failure about yourself  - 0 0  Trouble concentrating - 0 0  Moving slowly or fidgety/restless - 0 0  Suicidal thoughts - 0 0  PHQ-9 Score - 0 0  Difficult doing work/chores - Not difficult at all Not difficult at all  Some recent data might be hidden    Social History   Tobacco Use  Smoking Status Former   Packs/day: 0.50   Years: 20.00   Pack years: 10.00   Types: Cigarettes   Quit date: 05/30/1978   Years since quitting: 43.2  Smokeless Tobacco Never   BP Readings from Last 3 Encounters:  06/28/21 126/68  06/28/21 134/84  05/18/21 130/73   Pulse Readings from Last 3 Encounters:  06/28/21 84  06/28/21 74  04/16/21 69   Wt Readings from Last 3 Encounters:  06/28/21 230 lb (104.3 kg)  06/28/21  230 lb 9.6 oz (104.6 kg)  05/26/21 224 lb (101.6 kg)   BMI Readings from Last 3 Encounters:  06/28/21 32.54 kg/m  06/28/21 33.09 kg/m  05/26/21 32.14 kg/m    Assessment/Interventions: Review of patient past medical history, allergies, medications, health status, including review of consultants reports, laboratory and other test data, was performed as part of comprehensive evaluation and provision of chronic care management services.   SDOH:  (Social Determinants of Health) assessments and interventions performed: Yes   SDOH Screenings  Alcohol Screen: Low Risk    Last Alcohol Screening Score (AUDIT): 0  Depression (PHQ2-9): Low Risk    PHQ-2 Score: 0  Financial Resource Strain: Low Risk    Difficulty of Paying Living Expenses: Not hard at all  Food Insecurity: Not on file  Housing: [place] Risk Score: 0  Physical Activity: Inactive   Days of Exercise per Week: 0 days   Minutes of Exercise per Session: 0 min  Social Connections: Moderately Isolated   Frequency of Communication with Friends and Family: More than three times a week   Frequency of Social Gatherings with Friends and Family: Three times a week   Attends Religious Services: Never   Active Member of Clubs or Organizations: No   Attends Archivist Meetings: Never   Marital Status: Married  Stress: No Stress Concern Present   Feeling of Stress : Not at all  Tobacco Use: Medium Risk   Smoking Tobacco Use: Former   Smokeless Tobacco Use: Never   Passive Exposure: Not on file  Transportation Needs: No Transportation Needs   Lack of Transportation (Medical): No   Lack of Transportation (Non-Medical): No    CCM Care Plan  Allergies  Allergen Reactions   Metformin And Related Itching   Benicar Hct [Olmesartan Medoxomil-Hctz] Itching    Painful and itchy knots on palms of hands   Bystolic [Nebivolol Hcl] Other (See Comments)    headache   Dapsone Hives   Lovaza [Omega-3-Acid Ethyl  Esters] Other (See Comments)    Unknown reaction   Niaspan [Niacin Er] Other (See Comments)    flushing   Peanut-Containing Drug Products     Rash on hands from peanuts   Septra [Bactrim] Hives    Medications Reviewed Today     Reviewed by Charlton Haws, RPH (Pharmacist) on 08/03/21 at 1122  Med List Status: <None>   Medication Order Taking? Sig Documenting Provider Last Dose Status Informant  acarbose (PRECOSE) 25 MG tablet [id number] Yes Take 1 tablet (25 mg total) by mouth 3 (three) times daily with meals. Renato Shin, MD Taking Active   carvedilol (COREG) 6.25 MG tablet [id number] Yes Take 1 tablet (6.25 mg total) by mouth 2 (two) times daily with a meal. Gollan, Kathlene November, MD Taking Active   cetirizine (ZYRTEC) 10 MG tablet [id number] Yes Take 10 mg by mouth 2 (two) times daily.  [provider] Taking Active Multiple Informants  cholecalciferol (VITAMIN D3) 25 MCG (1000 UNIT) tablet [id number] Yes Take 3,000 Units by mouth daily. [provider] Taking Active Multiple Informants  clobetasol cream (TEMOVATE) 0.05 % [id number] Yes Apply 1 application topically 2 (two) times daily. To affected areas Ria Bush, MD Taking Active Multiple Informants  clopidogrel (PLAVIX) 75 MG tablet [id number] Yes Take 1 tablet (75 mg total) by mouth daily. Minna Merritts, MD Taking Active   ENTRESTO 49-51 MG [id number] Yes Take 1 tablet by mouth twice daily Gollan, Kathlene November, MD Taking Active   ezetimibe (ZETIA) 10 MG tablet [id number] Yes TAKE 1 TABLET BY MOUTH ONCE DAILY . APPOINTMENT REQUIRED FOR FUTURE REFILLS Minna Merritts, MD Taking Active   fexofenadine (ALLEGRA) 180 MG tablet [id number] Yes Take 180 mg by mouth daily. [provider] Taking Active   gabapentin (NEURONTIN) 100 MG capsule [id number] Yes Take 1 capsule (100 mg total) by mouth at bedtime. Copland, Frederico Hamman, MD Taking Active   glucose blood (ONETOUCH ULTRA) test strip [id number] Yes 1 each by Other  route in the morning and at bedtime. And lancets 2/day Renato Shin, MD Taking Active   LANCETS ULTRA THIN 30G [id number] Yes by Does not apply route. One Touch [provider] Taking Active Multiple Informants  metFORMIN (GLUCOPHAGE-XR) 500 MG 24 hr tablet [id number] Yes Take 1 tablet (500 mg total) by mouth daily with breakfast. Renato Shin, MD Taking Active   Multiple Vitamin (MULTIVITAMIN WITH MINERALS) TABS tablet [id number] Yes Take 1 tablet by mouth daily. [provider] Taking Active Multiple Informants  Multiple Vitamins-Minerals (AIRBORNE) TBEF [id number] Yes Take 1 tablet by mouth daily as needed (for immune system support). Immune Health/Support [provider] Taking Active Multiple Informants    Discontinued 08/16/11 1044 (Side effect (s))            Med Note (CRESPO, SHARON G   Wed Sep 23, 2020  8:20 AM)    nystatin cream (MYCOSTATIN) [id number] Yes Apply 1 application topically 2 (two) times daily as needed for dry skin. [provider] Taking Active Multiple Informants  repaglinide (PRANDIN) 2 MG tablet [id number] Yes Take 1 tablet (2 mg total) by mouth 3 (three) times daily before meals. Renato Shin, MD Taking Active   rosuvastatin (CRESTOR) 5 MG tablet [id number] Yes Take 5 mg by mouth 2 (two) times a week. Minna Merritts, MD Taking Active   spironolactone (ALDACTONE) 25 MG tablet [id number] Yes Take 1 tablet (25 mg total) by mouth daily. Minna Merritts, MD Taking Active   tetrahydrozoline 0.05 % ophthalmic solution [id number] Yes Place 1-2 drops into both eyes 3 (three) times daily as needed (for dry/irritated eyes). [provider] Taking Active Multiple Informants  Triamcinolone Acetonide (NASACORT ALLERGY 24HR NA) [id number] Yes Place 2 sprays into the nose at bedtime as needed (allergies).  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/31/2019   Right leg pain 12/17/2018   Ischemic cardiomyopathy 09/10/2018   Hip joint effusion, left 05/03/2018   Laceration of arm 12/04/2017   Current chronic use of systemic steroids 11/15/2016   Routine general medical examination at a health care facility 11/09/2015   Hearing loss 11/09/2015   History of colonic polyps 11/09/2015   Rash of hands 03/10/2015   Colon cancer screening 06/09/2014   Blepharitis, bilateral 06/02/2014   Benign paroxysmal positional vertigo 12/14/2013   Bursitis of right hip 10/17/2013   Coronary artery disease of native artery of native heart with stable angina pectoris ([place]) 03/19/2013   Encounter for Medicare annual wellness exam 03/05/2013   Adverse effect of glucocorticoid or synthetic analogue 03/05/2013   Osteopenia 03/05/2013   Class 1 obesity with serious comorbidity and body mass index (BMI) of 33.0 to 33.9 in adult 07/15/2011   Prostate cancer screening 07/10/2011   Hypertension 08/30/2010   Hyperlipidemia associated with type 2 diabetes mellitus ([place]) 08/30/2010   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/2010   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   Benign essential hypertension 02/13/2008   Pure hypercholesterolemia 02/13/2008    Immunization History  Administered Date(s) Administered   Fluad Quad(high Dose 65+) 03/13/2019, 03/15/2021   Influenza Split 05/16/2011, 02/16/2012   Influenza, High Dose Seasonal PF 02/28/2017   Influenza,inj,Quad PF,6+ Mos 03/05/2013, 03/10/2014, 05/20/2015, 03/30/2016, 05/10/2018   PFIZER(Purple Top)SARS-COV-2 Vaccination 07/12/2019, 08/02/2019, 01/17/2020   Pneumococcal Conjugate-13 06/09/2014  Pneumococcal Polysaccharide-23 07/15/2011   Td 11/29/2017    Conditions to be addressed/monitored:  Hypertension, Hyperlipidemia, Diabetes, Heart Failure, and Coronary Artery Disease  Care Plan : [place]   Updates made by Charlton Haws, [place] since 08/03/2021 12:00 AM     Problem: Hypertension, Hyperlipidemia, Diabetes, Heart Failure, and Coronary Artery Disease   Priority: High     Long-Range Goal: Disease Management   Start Date: 12/28/2020  Priority: High  Note:   Current Barriers:  Unable to independently monitor therapeutic efficacy  Pharmacist Clinical Goal(s):  Patient will achieve adherence to monitoring guidelines and medication adherence to achieve therapeutic efficacy through collaboration with PharmD and provider.   Interventions: 1:1 collaboration with Tower, Wynelle Fanny, MD regarding development and update of comprehensive plan of care as evidenced by provider attestation and co-signature Inter-disciplinary care team collaboration (see longitudinal plan of care) Comprehensive medication review performed; medication list updated in electronic medical record  Diabetes (A1c goal <7%) -Controlled - A1c 7.0% (05/2021); he has not been able to afford brand name medications and did not qualify for pt assistance -Followed by endocrine (Dr Loanne Drilling) -Current home BG readings: 115, 114, 116, 101, 125, 138, 110. 30day avg 124 -Current medications: Repaglinide 2 mg TID w/ meals - Appropriate, Effective, Safe, Accessible Acarbose 25 mg 1/2 tab TID w/ meals - Appropriate, Effective, Safe, Accessible Metformin 500 mg ER daily - Appropriate, Effective, Safe, Accessible -Medications previously tried: [place] - cost, [place] - cost, Januvia - cost -Counseled to check feet daily and get yearly eye exams -Recommended to continue current medication  Hyperlipidemia: (LDL goal < 70) -Not ideally controlled - LDL 104 (08/2020); he restarted rosuvastatin in Nov 2022 and has not had lipids rechecked; Pt endorses compliance with rosuvastatin a couple times a week, he has not been able to tolerate higher doses;  -Hx CAD -Current treatment: Rosuvastatin 5 mg 2x weekly - Appropriate, Query  Effective Ezetimibe 10 mg daily -  Appropriate, Query Effective -Medications previously tried: Atorvastatin, muscle aches -Educated on Cholesterol goals; Exercise goal of 150 minutes per week; -Recommend to continue current medication; recheck lipid panel  Heart Failure / Hypertension (BP goal < 130/80) -Controlled -Last ejection fraction: 35-40% (Date: 11/12/19) - improved from 54-65% -HF type: Systolic; NYHA Class: I (no actitivty limitation) -Current home BP/HR readings: 121/72, 130/73 -Entresto PAP was denied due to income too high per patient; he reports copay is lower and more reasonable in 2023 -Current treatment: Spironolactone 25 mg daily - Appropriate, Effective, Safe, Accessible Entresto 49-51 mg BID - Appropriate, Effective, Safe, Accessible Carvedilol 6.25 mg BID -Appropriate, Effective, Safe, Accessible -Medications previously tried: none  -Current exercise habits: activities include gardening, mowing grass -Educated on Importance of weighing daily; if you gain more than 3 pounds in one day or 5 pounds in one week, call office -Recommended to continue current medication  Patient Goals/Self-Care Activities Patient will:  - take medications as prescribed as evidenced by patient report and record review focus on medication adherence by routine check glucose daily, document, and provide at future appointments check blood pressure weekly, document, and provide at future appointments      Medication Assistance: None required.  Patient affirms current coverage meets needs.  Compliance/Adherence/Medication fill history: Care Gaps: None  Star-Rating Drugs: Metformin XR 500 mg - PDC 94% Repaglinide 2 mg - PDC 100% Acarbose 25 mg - PDC 100% Entresto 49-77m - PDC 90% Rosuvastatin 193m- PDC 100%   Patient's preferred pharmacy is:  [facility]  [place] [place] [facility] [place] [address] [place] [address] Phone: [phone] Fax:  [phone]  Uses pill box? Yes Pt endorses 100% compliance   Care Plan and Follow Up Patient Decision:  Patient agrees to Care Plan and Follow-up.  Follow Up Plan: Telephone follow up appointment with care management team member scheduled for: 6 months  Charlene Brooke, PharmD, BCACP Clinical Pharmacist [facility] ([place] [phone]

## 2021-08-03 NOTE — Patient Instructions (Signed)
Visit Information ? ?Phone number for Pharmacist: [phone] ? ? Goals Addressed   ? ?  ?  ?  ?  ? This Visit's Progress  ?  Monitor and Manage My Blood Sugar-Diabetes Type 2     ?  Timeframe:  Long-Range Goal ?Priority:  Medium ?Start Date:      08/03/21                       ?Expected End Date:  08/04/22                    ? ?Follow Up Date Sept 2023 ?  ?- check blood sugar at prescribed times ?- check blood sugar if I feel it is too high or too low ?- enter blood sugar readings and medication or insulin into daily log ?- take the blood sugar log to all doctor visits  ?  ?Why is this important?   ?Checking your blood sugar at home helps to keep it from getting very high or very low.  ?Writing the results in a diary or log helps the doctor know how to care for you.  ?Your blood sugar log should have the time, date and the results.  ?Also, write down the amount of insulin or other medicine that you take.  ?Other information, like what you ate, exercise done and how you were feeling, will also be helpful.   ?  ?Notes:  ?  ? ?  ? ? ?Care Plan : [place]  ?Updates made by Charlton Haws, RPH since 08/03/2021 12:00 AM  ?  ? ?Problem: Hypertension, Hyperlipidemia, Diabetes, Heart Failure, and Coronary Artery Disease   ?Priority: High  ?  ? ?Long-Range Goal: Disease Management   ?Start Date: 12/28/2020  ?Priority: High  ?Note:   ?Current Barriers:  ?Unable to independently monitor therapeutic efficacy ? ?Pharmacist Clinical Goal(s):  ?Patient will achieve adherence to monitoring guidelines and medication adherence to achieve therapeutic efficacy through collaboration with PharmD and provider.  ? ?Interventions: ?1:1 collaboration with Tower, Wynelle Fanny, MD regarding development and update of comprehensive plan of care as evidenced by provider attestation and co-signature ?Inter-disciplinary care team collaboration (see longitudinal plan of care) ?Comprehensive medication review performed; medication list  updated in electronic medical record ? ?Diabetes (A1c goal <7%) ?-Controlled - A1c 7.0% (05/2021); he has not been able to afford brand name medications and did not qualify for pt assistance ?-Followed by endocrine (Dr Loanne Drilling) ?-Current home BG readings: 115, 114, 116, 101, 125, 138, 110. 30day avg 124 ?-Current medications: ?Repaglinide 2 mg TID w/ meals - Appropriate, Effective, Safe, Accessible ?Acarbose 25 mg 1/2 tab TID w/ meals - Appropriate, Effective, Safe, Accessible ?Metformin 500 mg ER daily - Appropriate, Effective, Safe, Accessible ?-Medications previously tried: [place] - cost, [place] - cost, Januvia - cost ?-Counseled to check feet daily and get yearly eye exams ?-Recommended to continue current medication ? ?Hyperlipidemia: (LDL goal < 70) ?-Not ideally controlled - LDL 104 (08/2020); he restarted rosuvastatin in Nov 2022 and has not had lipids rechecked; Pt endorses compliance with rosuvastatin a couple times a week, he has not been able to tolerate higher doses;  ?-Hx CAD ?-Current treatment: ?Rosuvastatin 5 mg 2x weekly - Appropriate, Query Effective ?Ezetimibe 10 mg daily -  Appropriate, Query Effective ?-Medications previously tried: Atorvastatin, muscle aches ?-Educated on Cholesterol goals; Exercise goal of 150 minutes per week; ?-Recommend to continue current medication; recheck lipid panel ? ?Heart Failure /  Hypertension (BP goal < 130/80) ?-Controlled ?-Last ejection fraction: 35-40% (Date: 11/12/19) - improved from 30-35% ?-HF type: Systolic; NYHA Class: I (no actitivty limitation) ?-Current home BP/HR readings: 121/72, 130/73 ?-Entresto PAP was denied due to income too high per patient; he reports copay is lower and more reasonable in 2023 ?-Current treatment: ?Spironolactone 25 mg daily - Appropriate, Effective, Safe, Accessible ?Entresto 49-51 mg BID - Appropriate, Effective, Safe, Accessible ?Carvedilol 6.25 mg BID -Appropriate, Effective, Safe, Accessible ?-Medications previously  tried: none  ?-Current exercise habits: activities include gardening, mowing grass ?-Educated on Importance of weighing daily; if you gain more than 3 pounds in one day or 5 pounds in one week, call office ?-Recommended to continue current medication ? ?Patient Goals/Self-Care Activities ?Patient will:  ?- take medications as prescribed as evidenced by patient report and record review ?focus on medication adherence by routine ?check glucose daily, document, and provide at future appointments ?check blood pressure weekly, document, and provide at future appointments ? ?  ?  ? ?Patient verbalizes understanding of instructions and care plan provided today and agrees to view in [place]. Active MyChart status confirmed with patient.   ?Telephone follow up appointment with pharmacy team member scheduled for: 6 months ? ?Charlene Brooke, PharmD, BCACP ?Clinical Pharmacist ?[facility] at [facility] ?[phone] ?  ?

## 2021-08-04 ENCOUNTER — Encounter: Payer: Self-pay | Admitting: Gastroenterology

## 2021-08-04 ENCOUNTER — Ambulatory Visit (AMBULATORY_SURGERY_CENTER): Payer: Medicare HMO | Admitting: Gastroenterology

## 2021-08-04 VITALS — BP 123/69 | HR 65 | Temp 96.8°F | Resp 12 | Ht 70.0 in | Wt 230.0 lb

## 2021-08-04 DIAGNOSIS — D122 Benign neoplasm of ascending colon: Secondary | ICD-10-CM | POA: Diagnosis not present

## 2021-08-04 DIAGNOSIS — Z86010 Personal history of colonic polyps: Secondary | ICD-10-CM

## 2021-08-04 DIAGNOSIS — D120 Benign neoplasm of cecum: Secondary | ICD-10-CM

## 2021-08-04 MED ORDER — SODIUM CHLORIDE 0.9 % IV SOLN: 500.0000 mL | INTRAVENOUS | Status: DC

## 2021-08-04 NOTE — Patient Instructions (Signed)
Handouts given for Diverticulosis and polyps.  Resume previous diet and present medications.  Await pathology.  No repeat colonoscopy due to age.  Resume Plavix at prior dose tomorrow. ? ?YOU HAD AN ENDOSCOPIC PROCEDURE TODAY AT [facility]:   Refer to the procedure report that was given to you for any specific questions about what was found during the examination.  If the procedure report does not answer your questions, please call your gastroenterologist to clarify.  If you requested that your care partner not be given the details of your procedure findings, then the procedure report has been included in a sealed envelope for you to review at your convenience later. ? ?YOU SHOULD EXPECT: Some feelings of bloating in the abdomen. Passage of more gas than usual.  Walking can help get rid of the air that was put into your GI tract during the procedure and reduce the bloating. If you had a lower endoscopy (such as a colonoscopy or flexible sigmoidoscopy) you may notice spotting of blood in your stool or on the toilet paper. If you underwent a bowel prep for your procedure, you may not have a normal bowel movement for a few days. ? ?Please Note:  You might notice some irritation and congestion in your nose or some drainage.  This is from the oxygen used during your procedure.  There is no need for concern and it should clear up in a day or so. ? ?SYMPTOMS TO REPORT IMMEDIATELY: ? ?Following lower endoscopy (colonoscopy or flexible sigmoidoscopy): ? Excessive amounts of blood in the stool ? Significant tenderness or worsening of abdominal pains ? Swelling of the abdomen that is new, acute ? Fever of 100?F or higher ? ? ?For urgent or emergent issues, a gastroenterologist can be reached at any hour by calling [phone]. ?Do not use MyChart messaging for urgent concerns.  ? ? ?DIET:  We do recommend a small meal at first, but then you may proceed to your regular diet.  Drink plenty of fluids but you  should avoid alcoholic beverages for 24 hours. ? ?ACTIVITY:  You should plan to take it easy for the rest of today and you should NOT DRIVE or use heavy machinery until tomorrow (because of the sedation medicines used during the test).   ? ?FOLLOW UP: ?Our staff will call the number listed on your records 48-72 hours following your procedure to check on you and address any questions or concerns that you may have regarding the information given to you following your procedure. If we do not reach you, we will leave a message.  We will attempt to reach you two times.  During this call, we will ask if you have developed any symptoms of COVID 19. If you develop any symptoms (ie: fever, flu-like symptoms, shortness of breath, cough etc.) before then, please call [phone].  If you test positive for Covid 19 in the 2 weeks post procedure, please call and report this information to [place].   ? ?If any biopsies were taken you will be contacted by phone or by letter within the next 1-3 weeks.  Please call us at [phone] if you have not heard about the biopsies in 3 weeks.  ? ? ?SIGNATURES/CONFIDENTIALITY: ?You and/or your care partner have signed paperwork which will be entered into your electronic medical record.  These signatures attest to the fact that that the information above on your After Visit Summary has been reviewed and is understood.  Full responsibility of  the confidentiality of this discharge information lies with you and/or your care-partner.  ?

## 2021-08-04 NOTE — Progress Notes (Signed)
To pacu, VSS. Report to [email or web address] 

## 2021-08-04 NOTE — Progress Notes (Signed)
[facility] History and Physical   Primary Care Physician:  Tower, Wynelle Fanny, MD   Reason for Procedure:  History of adenomatous colon polyps  Plan:    Surveillance colonoscopy with possible interventions as needed     HPI: Ryan Morgan is a very pleasant 81 y.o. male here for surveillance colonoscopy. Denies any nausea, vomiting, abdominal pain, melena or bright red blood per rectum  The risks and benefits as well as alternatives of endoscopic procedure(s) have been discussed and reviewed. All questions answered. The patient agrees to proceed.    Past Medical History:  Diagnosis Date   Cancer ([place])    Basal cell ca of nose   Clotting disorder (H[facility])    to prevent CVS per pt - stent x2   Coronary artery disease    a. prior LAD stenting 2000; b. [place] 2003 LAD 40, D1 50; c. Lexiscan 02/2017: large defect of severe severity in the basal inferoseptal, basal inferior, mid inferoseptal, mid inferior and apical inferior location, high risk; d. LHC 03/29/17: LM nl, patent LAD stent w/ 40% ISR, D1 100,  OM1 90, RPDA 100 w/ L-R collats, post atrio 80% s/p PCI/DES     Diabetes mellitus    Hyperlipidemia    Hypertension    Ischemic cardiomyopathy    a. TTE 02/2017: EF 30-35%, diffuse HK, normal LV diastolic function parameters, mild AI/MR, RV systolic function normal, PASP normal   Lung nodule    a. s/p prior thoracotomy   Morbid obesity ([place])    Neuromuscular disorder ([place])    nerves lower back - causes numbness in bil feet & ankles   Wegener's granulomatosis 2007    Past Surgical History:  Procedure Laterality Date   BASAL CELL CARCINOMA EXCISION     removed from face x 3   CARDIAC CATHETERIZATION     CARDIAC SURGERY  05/30/1998   stent in heart   COLONOSCOPY     CORONARY ANGIOPLASTY  09/22/1998   s/p stent placement; [facility] 3.0 x 18 mm Ref [id number]   CORONARY STENT INTERVENTION N/A 03/29/2017   Procedure: CORONARY STENT INTERVENTION;  Surgeon: Wellington Hampshire,  MD;  Location: [facility];  Service: Cardiovascular;  Laterality: N/A;   LEFT HEART CATH AND CORONARY ANGIOGRAPHY N/A 03/29/2017   Procedure: LEFT HEART CATH AND CORONARY ANGIOGRAPHY;  Surgeon: Wellington Hampshire, MD;  Location: [facility];  Service: Cardiovascular;  Laterality: N/A;   LUNG SURGERY     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   POLYPECTOMY     ULTRASOUND GUIDANCE FOR VASCULAR ACCESS  03/29/2017   Procedure: Ultrasound Guidance For Vascular Access;  Surgeon: Wellington Hampshire, MD;  Location: [facility];  Service: Cardiovascular;;    Prior to Admission medications   Medication Sig Start Date End Date Taking? Authorizing Provider  glucose blood (ONETOUCH ULTRA) test strip 1 each by Other route in the morning and at bedtime. And lancets 2/day 06/22/20  Yes Renato Shin, MD  LANCETS ULTRA THIN 30G MISC by Does not apply route. One Touch   Yes [provider]  acarbose (PRECOSE) 25 MG tablet Take 1 tablet (25 mg total) by mouth 3 (three) times daily with meals. 06/28/21   Renato Shin, MD  carvedilol (COREG) 6.25 MG tablet Take 1 tablet (6.25 mg total) by mouth 2 (two) times daily with a meal. 04/16/21   Gollan, Kathlene November, MD  cetirizine (ZYRTEC) 10 MG tablet Take 10 mg by mouth 2 (two)  times daily.     [provider]  cholecalciferol (VITAMIN D3) 25 MCG (1000 UNIT) tablet Take 3,000 Units by mouth daily.    [provider]  clobetasol cream (TEMOVATE) 2.35 % Apply 1 application topically 2 (two) times daily. To affected areas 04/19/17   Ria Bush, MD  clopidogrel (PLAVIX) 75 MG tablet Take 1 tablet (75 mg total) by mouth daily. 04/16/21   Minna Merritts, MD  ENTRESTO 49-51 MG Take 1 tablet by mouth twice daily 07/08/21   Minna Merritts, MD  ezetimibe (ZETIA) 10 MG tablet TAKE 1 TABLET BY MOUTH ONCE DAILY . APPOINTMENT REQUIRED FOR FUTURE REFILLS 04/16/21   Minna Merritts, MD  fexofenadine (ALLEGRA) 180 MG tablet  Take 180 mg by mouth daily. Patient not taking: Reported on 08/04/2021    [provider]  gabapentin (NEURONTIN) 100 MG capsule Take 1 capsule (100 mg total) by mouth at bedtime. 03/15/21   Copland, Frederico Hamman, MD  metFORMIN (GLUCOPHAGE-XR) 500 MG 24 hr tablet Take 1 tablet (500 mg total) by mouth daily with breakfast. 06/28/21   Renato Shin, MD  Multiple Vitamin (MULTIVITAMIN WITH MINERALS) TABS tablet Take 1 tablet by mouth daily.    [provider]  Multiple Vitamins-Minerals (AIRBORNE) TBEF Take 1 tablet by mouth daily as needed (for immune system support). Immune Health/Support    [provider]  nystatin cream (MYCOSTATIN) Apply 1 application topically 2 (two) times daily as needed for dry skin.    [provider]  repaglinide (PRANDIN) 2 MG tablet Take 1 tablet (2 mg total) by mouth 3 (three) times daily before meals. 06/28/21   Renato Shin, MD  rosuvastatin (CRESTOR) 5 MG tablet Take 5 mg by mouth 2 (two) times a week. 04/16/21   Minna Merritts, MD  spironolactone (ALDACTONE) 25 MG tablet Take 1 tablet (25 mg total) by mouth daily. 04/16/21   Minna Merritts, MD  tetrahydrozoline 0.05 % ophthalmic solution Place 1-2 drops into both eyes 3 (three) times daily as needed (for dry/irritated eyes).    [provider]  Triamcinolone Acetonide (NASACORT ALLERGY 24HR NA) Place 2 sprays into the nose at bedtime as needed (allergies).     [provider]  nebivolol (BYSTOLIC) 5 MG tablet Take 1 tablet (5 mg total) by mouth 2 (two) times daily. 07/15/11 05/03/18  Tower, Wynelle Fanny, MD    Current Outpatient Medications  Medication Sig Dispense Refill   glucose blood (ONETOUCH ULTRA) test strip 1 each by Other route in the morning and at bedtime. And lancets 2/day 200 each 3   LANCETS ULTRA THIN 30G MISC by Does not apply route. One Touch     acarbose (PRECOSE) 25 MG tablet Take 1 tablet (25 mg total) by mouth 3 (three) times daily with meals. 300  tablet 3   carvedilol (COREG) 6.25 MG tablet Take 1 tablet (6.25 mg total) by mouth 2 (two) times daily with a meal. 180 tablet 3   cetirizine (ZYRTEC) 10 MG tablet Take 10 mg by mouth 2 (two) times daily.      cholecalciferol (VITAMIN D3) 25 MCG (1000 UNIT) tablet Take 3,000 Units by mouth daily.     clobetasol cream (TEMOVATE) 5.73 % Apply 1 application topically 2 (two) times daily. To affected areas 30 g 1   clopidogrel (PLAVIX) 75 MG tablet Take 1 tablet (75 mg total) by mouth daily. 90 tablet 3   ENTRESTO 49-51 MG Take 1 tablet by mouth twice daily 180 tablet  2   ezetimibe (ZETIA) 10 MG tablet TAKE 1 TABLET BY MOUTH ONCE DAILY . APPOINTMENT REQUIRED FOR FUTURE REFILLS 90 tablet 3   fexofenadine (ALLEGRA) 180 MG tablet Take 180 mg by mouth daily. (Patient not taking: Reported on 08/04/2021)     gabapentin (NEURONTIN) 100 MG capsule Take 1 capsule (100 mg total) by mouth at bedtime. 30 capsule 3   metFORMIN (GLUCOPHAGE-XR) 500 MG 24 hr tablet Take 1 tablet (500 mg total) by mouth daily with breakfast. 100 tablet 3   Multiple Vitamin (MULTIVITAMIN WITH MINERALS) TABS tablet Take 1 tablet by mouth daily.     Multiple Vitamins-Minerals (AIRBORNE) TBEF Take 1 tablet by mouth daily as needed (for immune system support). Immune Health/Support     nystatin cream (MYCOSTATIN) Apply 1 application topically 2 (two) times daily as needed for dry skin.     repaglinide (PRANDIN) 2 MG tablet Take 1 tablet (2 mg total) by mouth 3 (three) times daily before meals. 300 tablet 3   rosuvastatin (CRESTOR) 5 MG tablet Take 5 mg by mouth 2 (two) times a week. 90 tablet 3   spironolactone (ALDACTONE) 25 MG tablet Take 1 tablet (25 mg total) by mouth daily. 90 tablet 3   tetrahydrozoline 0.05 % ophthalmic solution Place 1-2 drops into both eyes 3 (three) times daily as needed (for dry/irritated eyes).     Triamcinolone Acetonide (NASACORT ALLERGY 24HR NA) Place 2 sprays into the nose at bedtime as needed (allergies).       Current Facility-Administered Medications  Medication Dose Route Frequency Provider Last Rate Last Admin   0.9 %  sodium chloride infusion  500 mL Intravenous Continuous Geoge Lawrance, Venia Minks, MD        Allergies as of 08/04/2021 - Review Complete 08/04/2021  Allergen Reaction Noted   Benicar hct [olmesartan medoxomil-hctz] Itching 67/61/9509   Bystolic [nebivolol hcl] Other (See Comments) 08/16/2011   Dapsone Hives 05/03/2018   Lovaza [omega-3-acid ethyl esters] Other (See Comments) 03/24/2017   Niaspan [niacin er] Other (See Comments) 08/30/2010   Peanut-containing drug products  12/04/2018   Septra [bactrim] Hives 08/30/2010    Family History  Problem Relation Age of Onset   Cancer Mother        tumor on tonsil   Stroke Maternal Grandfather    Colon cancer Neg Hx    Esophageal cancer Neg Hx    Rectal cancer Neg Hx    Stomach cancer Neg Hx     Social History   Socioeconomic History   Marital status: Married    Spouse name: Not on file   Number of children: Not on file   Years of education: Not on file   Highest education level: Not on file  Occupational History   Not on file  Tobacco Use   Smoking status: Former    Packs/day: 0.50    Years: 20.00    Pack years: 10.00    Types: Cigarettes    Quit date: 05/30/1978    Years since quitting: 43.2   Smokeless tobacco: Never  Vaping Use   Vaping Use: Never used  Substance and Sexual Activity   Alcohol use: No    Alcohol/week: 0.0 standard drinks   Drug use: No   Sexual activity: Not Currently  Other Topics Concern   Not on file  Social History Narrative   Regular exercise: yes   Caffeine use: very little   Social Determinants of Health   Financial Resource Strain: Low Risk    Difficulty  of Paying Living Expenses: Not hard at all  Food Insecurity: Not on file  Transportation Needs: No Transportation Needs   Lack of Transportation (Medical): No   Lack of Transportation (Non-Medical): No  Physical Activity:  Inactive   Days of Exercise per Week: 0 days   Minutes of Exercise per Session: 0 min  Stress: No Stress Concern Present   Feeling of Stress : Not at all  Social Connections: Moderately Isolated   Frequency of Communication with Friends and Family: More than three times a week   Frequency of Social Gatherings with Friends and Family: Three times a week   Attends Religious Services: Never   Active Member of Clubs or Organizations: No   Attends Archivist Meetings: Never   Marital Status: Married  Human resources officer Violence: Not At Risk   Fear of Current or Ex-Partner: No   Emotionally Abused: No   Physically Abused: No   Sexually Abused: No    Review of Systems:  All other review of systems negative except as mentioned in the HPI.  Physical Exam: Vital signs in last 24 hours: BP 98/65    Pulse 74    Temp (!) 96.8 F (36 C)    Ht '5\' 10"'$  (1.778 m)    Wt 230 lb (104.3 kg)    SpO2 96%    BMI 33.00 kg/m  General:   Alert, NAD Lungs:  Clear .   Heart:  Regular rate and rhythm Abdomen:  Soft, nontender and nondistended. Neuro/Psych:  Alert and cooperative. Normal mood and affect. A and O x 3  Reviewed labs, radiology imaging, old records and pertinent past GI work up  Patient is appropriate for planned procedure(s) and anesthesia in an ambulatory setting   K. Denzil Magnuson , MD [phone]

## 2021-08-04 NOTE — Op Note (Addendum)
[place] ?Patient Name: Ryan Morgan ?Procedure Date: 08/04/2021 2:07 PM ?MRN: [id number] ?Endoscopist: Mauri Pole , MD ?Age: 81 ?Referring MD:  ?Date of Birth: 1941/01/05 ?Gender: Male ?Account #: [id number] ?Procedure:                Colonoscopy ?Indications:              High risk colon cancer surveillance: Personal  ?                          history of colonic polyps ?Medicines:                Monitored Anesthesia Care ?Procedure:                Pre-Anesthesia Assessment: ?                          - Prior to the procedure, a History and Physical  ?                          was performed, and patient medications and  ?                          allergies were reviewed. The patient's tolerance of  ?                          previous anesthesia was also reviewed. The risks  ?                          and benefits of the procedure and the sedation  ?                          options and risks were discussed with the patient.  ?                          All questions were answered, and informed consent  ?                          was obtained. Prior Anticoagulants: The patient  ?                          last took Plavix (clopidogrel) 5 days prior to the  ?                          procedure. ASA Grade Assessment: III - A patient  ?                          with severe systemic disease. After reviewing the  ?                          risks and benefits, the patient was deemed in  ?                          satisfactory condition to undergo the procedure. ?  After obtaining informed consent, the colonoscope  ?                          was passed under direct vision. Throughout the  ?                          procedure, the patient's blood pressure, pulse, and  ?                          oxygen saturations were monitored continuously. The  ?                          Olympus PCF-H190DL ([id number]) Colonoscope was  ?                          introduced through the anus and  advanced to the the  ?                          cecum, identified by appendiceal orifice and  ?                          ileocecal valve. The colonoscopy was performed  ?                          without difficulty. The patient tolerated the  ?                          procedure well. The quality of the bowel  ?                          preparation was good. The ileocecal valve,  ?                          appendiceal orifice, and rectum were photographed. ?Scope In: 2:12:54 PM ?Scope Out: 2:35:55 PM ?Scope Withdrawal Time: 0 hours 13 minutes 19 seconds  ?Total Procedure Duration: 0 hours 23 minutes 1 second  ?Findings:                 The perianal and digital rectal examinations were  ?                          normal. ?                          Two sessile polyps were found in the ascending  ?                          colon and cecum. The polyps were 5 to 10 mm in  ?                          size. These polyps were removed with a cold snare.  ?                          Resection were complete and only of the polyps was  ?  retrieved. ?                          Scattered small-mouthed diverticula were found in  ?                          the sigmoid colon. ?                          Non-bleeding external and internal hemorrhoids were  ?                          found during retroflexion. The hemorrhoids were  ?                          small. ?Complications:            No immediate complications. ?Estimated Blood Loss:     Estimated blood loss was minimal. ?Impression:               - Two 5 to 10 mm polyps in the ascending colon and  ?                          in the cecum, removed with a cold snare. Resected  ?                          and retrieved. ?                          - Diverticulosis in the sigmoid colon. ?                          - Non-bleeding external and internal hemorrhoids. ?Recommendation:           - Patient has a contact number available for  ?                           emergencies. The signs and symptoms of potential  ?                          delayed complications were discussed with the  ?                          patient. Return to normal activities tomorrow.  ?                          Written discharge instructions were provided to the  ?                          patient. ?                          - Resume previous diet. ?                          - Continue present medications. ?                          -  Await pathology results. ?                          - No repeat colonoscopy due to age. ?                          - Resume Plavix (clopidogrel) at prior dose  ?                          tomorrow. ?Mauri Pole, MD ?08/04/2021 2:40:34 PM ?This report has been signed electronically. ?

## 2021-08-04 NOTE — Progress Notes (Signed)
DT - VS ? ?Last dose of PLAVIX 07/29/21.  Sign hanging from IV pole. ?

## 2021-08-04 NOTE — Progress Notes (Signed)
Called to room to assist during endoscopic procedure.  Patient ID and intended procedure confirmed with present staff. Received instructions for my participation in the procedure from the performing physician.  

## 2021-08-06 ENCOUNTER — Telehealth: Payer: Self-pay

## 2021-08-06 NOTE — Telephone Encounter (Signed)
?  Follow up Call- ? ?Call back number 08/04/2021  ?Post procedure Call Back phone  # [phone] cell  ?Permission to leave phone message Yes  ?Some recent data might be hidden  ?  ? ?Patient questions: ? ?Do you have a fever, pain , or abdominal swelling? No. ?Pain Score  0 * ? ?Have you tolerated food without any problems? Yes.   ? ?Have you been able to return to your normal activities? Yes.   ? ?Do you have any questions about your discharge instructions: ?Diet   No. ?Medications  No. ?Follow up visit  No. ? ?Do you have questions or concerns about your Care? No. ? ?Actions: ?* If pain score is 4 or above: ?No action needed, pain <4. ? ? ?

## 2021-08-06 NOTE — Telephone Encounter (Signed)
Left message on follow up call. 

## 2021-08-12 ENCOUNTER — Encounter: Payer: Self-pay | Admitting: Gastroenterology

## 2021-08-27 DIAGNOSIS — E1169 Type 2 diabetes mellitus with other specified complication: Secondary | ICD-10-CM

## 2021-08-27 DIAGNOSIS — E11311 Type 2 diabetes mellitus with unspecified diabetic retinopathy with macular edema: Secondary | ICD-10-CM

## 2021-08-27 DIAGNOSIS — E785 Hyperlipidemia, unspecified: Secondary | ICD-10-CM | POA: Diagnosis not present

## 2021-08-27 DIAGNOSIS — I110 Hypertensive heart disease with heart failure: Secondary | ICD-10-CM | POA: Diagnosis not present

## 2021-08-27 DIAGNOSIS — I25118 Atherosclerotic heart disease of native coronary artery with other forms of angina pectoris: Secondary | ICD-10-CM | POA: Diagnosis not present

## 2021-09-15 ENCOUNTER — Other Ambulatory Visit: Payer: Self-pay | Admitting: Endocrinology

## 2021-09-15 ENCOUNTER — Other Ambulatory Visit: Payer: Self-pay | Admitting: Family Medicine

## 2021-09-15 DIAGNOSIS — E11311 Type 2 diabetes mellitus with unspecified diabetic retinopathy with macular edema: Secondary | ICD-10-CM

## 2021-09-15 NOTE — Telephone Encounter (Signed)
Last office visit 05/18/2021 (virtual) with Dr. Glori Bickers for URI with cough.  Last refilled 03/15/2021 for #30 with 3 refills by Dr. Lorelei Pont.  Next Appt: 09/17/2021 for ear pain with Dr. Glori Bickers.  ?

## 2021-09-17 ENCOUNTER — Ambulatory Visit (INDEPENDENT_AMBULATORY_CARE_PROVIDER_SITE_OTHER): Payer: Medicare HMO | Admitting: Family Medicine

## 2021-09-17 ENCOUNTER — Encounter: Payer: Self-pay | Admitting: Family Medicine

## 2021-09-17 VITALS — BP 112/68 | HR 64 | Temp 98.0°F | Ht 70.0 in | Wt 230.1 lb

## 2021-09-17 DIAGNOSIS — E1169 Type 2 diabetes mellitus with other specified complication: Secondary | ICD-10-CM

## 2021-09-17 DIAGNOSIS — J309 Allergic rhinitis, unspecified: Secondary | ICD-10-CM | POA: Insufficient documentation

## 2021-09-17 DIAGNOSIS — S99929A Unspecified injury of unspecified foot, initial encounter: Secondary | ICD-10-CM

## 2021-09-17 DIAGNOSIS — E785 Hyperlipidemia, unspecified: Secondary | ICD-10-CM

## 2021-09-17 DIAGNOSIS — H9201 Otalgia, right ear: Secondary | ICD-10-CM

## 2021-09-17 DIAGNOSIS — J301 Allergic rhinitis due to pollen: Secondary | ICD-10-CM | POA: Diagnosis not present

## 2021-09-17 LAB — LIPID PANEL
Cholesterol: 98 mg/dL (ref 0–200)
HDL: 37.5 mg/dL — ABNORMAL LOW (ref >39.00)
LDL Cholesterol: 28 mg/dL (ref 0–99)
NonHDL: 60.07
Total CHOL/HDL Ratio: 3
Triglycerides: 161 mg/dL — ABNORMAL HIGH (ref 0.0–149.0)
VLDL: 32.2 mg/dL (ref 0.0–40.0)

## 2021-09-17 MED ORDER — ROSUVASTATIN CALCIUM 5 MG PO TABS: 5.0000 mg | ORAL_TABLET | ORAL | 0 refills | Status: DC

## 2021-09-17 MED ORDER — FLUTICASONE PROPIONATE 50 MCG/ACT NA SUSP: 2.0000 | Freq: Every day | NASAL | 6 refills | Status: AC

## 2021-09-17 NOTE — Assessment & Plan Note (Signed)
Pt has black color to cuticle and lateral nail (R great toe lateral)   ?Resembles injury/ecchymosis  ?Small scab at base of 2nd nail also  ?Strongly suspect trauma though he doe snot remember it  ?On plavix/bruises easily  ?Will continue to watch this and make sure it grows out ?

## 2021-09-17 NOTE — Assessment & Plan Note (Signed)
Taking zyrted ?Now some ETD symptoms  ?Added flonase  ?Will update if not improved  ?Adv to avoid pollen when able (spring is worse for him) ?

## 2021-09-17 NOTE — Assessment & Plan Note (Signed)
Suspect due to ETD and causing some dizziness as well ?Reassuring exam  ?Adv to start generic flonase ns 2 sp per nostril daily =for 1-2 months or during all season ? ?Update if not starting to improve in a week or if worsening   ? ?

## 2021-09-17 NOTE — Progress Notes (Signed)
? ?Subjective:  ? ? Patient ID: Ryan Morgan, male    DOB: 1941-05-16, 81 y.o.   MRN: [id number] ? ?HPI ?Pt presents for right ear pain  ?Also side effect from crestor  ? ?Also darkness of lateral L big toenail=about a month ?No pain  ?No trauma  ?On plavix ? ? ?Wt Readings from Last 3 Encounters:  ?09/17/21 230 lb 2 oz (104.4 kg)  ?08/04/21 230 lb (104.3 kg)  ?06/28/21 230 lb (104.3 kg)  ? ?33.02 kg/m? ? ?R ear pain  on /off for 4-5 weeks  ?Worse when lying on that side  (that can make him dizzy as well) -not quite to the point of vertigo  ?Feels full  ?Sounded like he was talking in a barrel   ?Hearing is muffled at times  ?Little bump in entrance of ear- was sore/used abx oint and it got better  ? ?Allergies : pollen  ?Takes zyrtec  ?Worse in the spring  ?A little sniffling if outside  ?A little congested  ?A few headaches in 2 wk/top of head  ? ?No more cough than usual  ?No wheezing  ? ? ?Taking crestor 5 mg daily  ?Is a re trial from cardiology in the fall  ?Having side effects - causing leg pain  ? ? ?Has h/o CAD  ?Lab Results  ?Component Value Date  ? CHOL 185 09/23/2020  ? HDL 34 (L) 09/23/2020  ? LDLCALC 104 (H) 09/23/2020  ? LDLDIRECT 100.0 05/17/2017  ? TRIG 274 (H) 09/23/2020  ? CHOLHDL 5.4 (H) 09/23/2020  ? ?Can tolerate it three times per week  ? ?Patient Active Problem List  ? Diagnosis Date Noted  ? Allergic rhinitis 09/17/2021  ? Injury of nail bed of toe 09/17/2021  ? 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/31/2019  ? Right leg pain 12/17/2018  ? Ischemic cardiomyopathy 09/10/2018  ? Hip joint effusion, left 05/03/2018  ? Laceration of arm 12/04/2017  ? Current chronic use of systemic steroids 11/15/2016  ? Routine general medical examination at a health care facility 11/09/2015  ? Hearing loss 11/09/2015  ? History of colonic polyps 11/09/2015  ? Rash of hands 03/10/2015  ? Right ear pain 06/09/2014  ? Colon cancer screening 06/09/2014  ? Blepharitis, bilateral 06/02/2014  ? Benign  paroxysmal positional vertigo 12/14/2013  ? Bursitis of right hip 10/17/2013  ? Coronary artery disease of native artery of native heart with stable angina pectoris ([place]) 03/19/2013  ? Encounter for Medicare annual wellness exam 03/05/2013  ? Adverse effect of glucocorticoid or synthetic analogue 03/05/2013  ? Osteopenia 03/05/2013  ? Class 1 obesity with serious comorbidity and body mass index (BMI) of 33.0 to 33.9 in adult 07/15/2011  ? Prostate cancer screening 07/10/2011  ? Hypertension 08/30/2010  ? Hyperlipidemia associated with type 2 diabetes mellitus ([place]) 08/30/2010  ? Diabetes mellitus type 2 with retinopathy ([place]) 08/30/2010  ? 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 Benign essential hypertension 02/13/2008  ? Pure hypercholesterolemia 02/13/2008  ? ?Past Medical History:  ?Diagnosis Date  ? Cancer [facility])   ? Basal cell ca of nose  ? Clotting disorder ([place])   ? to prevent CVS per pt - stent x2  ? Coronary artery disease   ? a. prior LAD stenting 2000; b. [place] 2003 LAD 40, D1 50; c. Lexiscan 02/2017: large defect of severe severity in the basal inferoseptal, basal inferior, mid inferoseptal, mid inferior and apical inferior location, high risk; d. LHC 03/29/17: LM nl, patent LAD stent  w/ 40% ISR, D1 100,  OM1 90, RPDA 100 w/ L-R collats, post atrio 80% s/p PCI/DES    ? Diabetes mellitus   ? Hyperlipidemia   ? Hypertension   ? Ischemic cardiomyopathy   ? a. TTE 02/2017: EF 30-35%, diffuse HK, normal LV diastolic function parameters, mild AI/MR, RV systolic function normal, PASP normal  ? Lung nodule   ? a. s/p prior thoracotomy  ? Morbid obesity ([place])   ? Neuromuscular disorder ([place])   ? nerves lower back - causes numbness in bil feet & ankles  ? Wegener's granulomatosis 2007  ? ?Past Surgical History:  ?Procedure Laterality Date  ? BASAL CELL CARCINOMA EXCISION    ? removed from face x 3  ? CARDIAC CATHETERIZATION    ? CARDIAC SURGERY  05/30/1998  ? stent in heart  ? COLONOSCOPY    ? CORONARY ANGIOPLASTY   09/22/1998  ? s/p stent placement; [facility] 3.0 x 18 mm Ref [id number]  ? CORONARY STENT INTERVENTION N/A 03/29/2017  ? Procedure: CORONARY STENT INTERVENTION;  Surgeon: Wellington Hampshire, MD;  Location: [place] [facility];  Service: Cardiovascular;  Laterality: N/A;  ? LEFT HEART CATH AND CORONARY ANGIOGRAPHY N/A 03/29/2017  ? Procedure: LEFT HEART CATH AND CORONARY ANGIOGRAPHY;  Surgeon: Wellington Hampshire, MD;  Location: [place] [facility];  Service: Cardiovascular;  Laterality: N/A;  ? LUNG SURGERY    ? 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  ? POLYPECTOMY    ? ULTRASOUND GUIDANCE FOR VASCULAR ACCESS  03/29/2017  ? Procedure: Ultrasound Guidance For Vascular Access;  Surgeon: Wellington Hampshire, MD;  Location: [place] [facility];  Service: Cardiovascular;;  ? ?Social History  ? ?Tobacco Use  ? Smoking status: Former  ?  Packs/day: 0.50  ?  Years: 20.00  ?  Pack years: 10.00  ?  Types: Cigarettes  ?  Quit date: 05/30/1978  ?  Years since quitting: 43.3  ? Smokeless tobacco: Never  ?Vaping Use  ? Vaping Use: Never used  ?Substance Use Topics  ? Alcohol use: No  ?  Alcohol/week: 0.0 standard drinks  ? Drug use: No  ? ?Family History  ?Problem Relation Age of Onset  ? Cancer Mother   ?     tumor on tonsil  ? Stroke Maternal Grandfather   ? Colon cancer Neg Hx   ? Esophageal cancer Neg Hx   ? Rectal cancer Neg Hx   ? Stomach cancer Neg Hx   ? ?Allergies  ?Allergen Reactions  ? Benicar Hct [Olmesartan Medoxomil-Hctz] Itching  ?  Painful and itchy knots on palms of hands  ? Bystolic [Nebivolol Hcl] Other (See Comments)  ?  headache  ? Dapsone Hives  ? Lovaza [Omega-3-Acid Ethyl Esters] Other (See Comments)  ?  Unknown reaction  ? Niaspan [Niacin Er] Other (See Comments)  ?  flushing  ? Peanut-Containing Drug Products   ?  Rash on hands from peanuts  ? Septra [Bactrim] Hives  ? ?Current Outpatient Medications on File Prior to Visit  ?Medication Sig Dispense Refill  ? acarbose (PRECOSE) 25 MG tablet Take 1 tablet (25 mg  total) by mouth 3 (three) times daily with meals. 300 tablet 3  ? carvedilol (COREG) 6.25 MG tablet Take 1 tablet (6.25 mg total) by mouth 2 (two) times daily with a meal. 180 tablet 3  ? cetirizine (ZYRTEC) 10 MG tablet Take 10 mg by mouth 2 (two) times daily.     ? cholecalciferol (VITAMIN D3) 25 MCG (  1000 UNIT) tablet Take 3,000 Units by mouth daily.    ? clobetasol cream (TEMOVATE) 5.00 % Apply 1 application topically 2 (two) times daily. To affected areas 30 g 1  ? clopidogrel (PLAVIX) 75 MG tablet Take 1 tablet (75 mg total) by mouth daily. 90 tablet 3  ? ENTRESTO 49-51 MG Take 1 tablet by mouth twice daily 180 tablet 2  ? ezetimibe (ZETIA) 10 MG tablet TAKE 1 TABLET BY MOUTH ONCE DAILY . APPOINTMENT REQUIRED FOR FUTURE REFILLS 90 tablet 3  ? fexofenadine (ALLEGRA) 180 MG tablet Take 180 mg by mouth daily.    ? gabapentin (NEURONTIN) 100 MG capsule Take 1 capsule by mouth at bedtime 30 capsule 0  ? LANCETS ULTRA THIN 30G MISC by Does not apply route. One Touch    ? metFORMIN (GLUCOPHAGE-XR) 500 MG 24 hr tablet Take 1 tablet (500 mg total) by mouth daily with breakfast. 100 tablet 3  ? Multiple Vitamin (MULTIVITAMIN WITH MINERALS) TABS tablet Take 1 tablet by mouth daily.    ? Multiple Vitamins-Minerals (AIRBORNE) TBEF Take 1 tablet by mouth daily as needed (for immune system support). Immune Health/Support    ? nystatin cream (MYCOSTATIN) Apply 1 application topically 2 (two) times daily as needed for dry skin.    ? ONETOUCH ULTRA test strip USE 1 STRIP TO CHECK GLUCOSE IN THE MORNING AND 1 AT BEDTIME 200 each 0  ? repaglinide (PRANDIN) 2 MG tablet Take 1 tablet (2 mg total) by mouth 3 (three) times daily before meals. 300 tablet 3  ? spironolactone (ALDACTONE) 25 MG tablet Take 1 tablet (25 mg total) by mouth daily. 90 tablet 3  ? tetrahydrozoline 0.05 % ophthalmic solution Place 1-2 drops into both eyes 3 (three) times daily as needed (for dry/irritated eyes).    ? ?No current facility-administered  medications on file prior to visit.  ?  ?Review of Systems  ?Constitutional:  Negative for activity change, appetite change, fatigue, fever and unexpected weight change.  ?HENT:  Positive for congestion and ear pain.

## 2021-09-17 NOTE — Assessment & Plan Note (Signed)
Was unable to tolerate crestor 5 daily but does ok with it three times weekly  ? ?Will continue this  ?Lab today for lipids  ?

## 2021-09-17 NOTE — Patient Instructions (Addendum)
Continue the rosuvastatin three times weekly if that is all you can tolerate  ? ?Eat a lot cholesterol diet also ?Avoid red meat/ fried foods/ egg yolks/ fatty breakfast meats/ butter, cheese and high fat dairy/ and shellfish  ?Let's check your cholesterol today  ? ?For the ear symptoms and dizziness  ?Try flonase nasal spray 2 sprays in each nostril twice daily for 1-2 months or during allergy season  ? ?This may be due to allergies/ ear tube dysfunction  ? ?Update if not starting to improve in a week or if worsening   ? ?If the toenail does not improve in the next few months let us know  ?I think it is a bruise  ? ? ?

## 2021-09-21 ENCOUNTER — Encounter: Payer: Self-pay | Admitting: *Deleted

## 2021-10-26 DIAGNOSIS — I7782 Antineutrophilic cytoplasmic antibody (ANCA) vasculitis: Secondary | ICD-10-CM | POA: Diagnosis not present

## 2021-10-26 DIAGNOSIS — R768 Other specified abnormal immunological findings in serum: Secondary | ICD-10-CM | POA: Diagnosis not present

## 2021-10-28 ENCOUNTER — Telehealth: Payer: Self-pay

## 2021-10-28 NOTE — Chronic Care Management (AMB) (Signed)
Chronic Care Management Pharmacy Assistant   Name: Ryan Morgan  MRN: [id number] DOB: 27-Mar-1941  Reason for Encounter: General Adherence    Recent office visits:  09/17/21-Marne Tower,MD(PCP)-right ear pain,dizziness-Adv to start generic flonase ns 2 sp per nostril daily =for 1-2 months or during all season,labs ordered (Cholesterol is controlled )  Recent consult visits:  08/04/21-Kavitha Nandigam,MD(gastro)-colonoscopy   Hospital visits:  None in previous 6 months  Medications: Outpatient Encounter Medications as of 10/28/2021  Medication Sig   acarbose (PRECOSE) 25 MG tablet Take 1 tablet (25 mg total) by mouth 3 (three) times daily with meals.   carvedilol (COREG) 6.25 MG tablet Take 1 tablet (6.25 mg total) by mouth 2 (two) times daily with a meal.   cetirizine (ZYRTEC) 10 MG tablet Take 10 mg by mouth 2 (two) times daily.    cholecalciferol (VITAMIN D3) 25 MCG (1000 UNIT) tablet Take 3,000 Units by mouth daily.   clobetasol cream (TEMOVATE) 6.31 % Apply 1 application topically 2 (two) times daily. To affected areas   clopidogrel (PLAVIX) 75 MG tablet Take 1 tablet (75 mg total) by mouth daily.   ENTRESTO 49-51 MG Take 1 tablet by mouth twice daily   ezetimibe (ZETIA) 10 MG tablet TAKE 1 TABLET BY MOUTH ONCE DAILY . APPOINTMENT REQUIRED FOR FUTURE REFILLS   fexofenadine (ALLEGRA) 180 MG tablet Take 180 mg by mouth daily.   fluticasone (FLONASE) 50 MCG/ACT nasal spray Place 2 sprays into both nostrils daily.   gabapentin (NEURONTIN) 100 MG capsule Take 1 capsule by mouth at bedtime   LANCETS ULTRA THIN 30G MISC by Does not apply route. One Touch   metFORMIN (GLUCOPHAGE-XR) 500 MG 24 hr tablet Take 1 tablet (500 mg total) by mouth daily with breakfast.   Multiple Vitamin (MULTIVITAMIN WITH MINERALS) TABS tablet Take 1 tablet by mouth daily.   Multiple Vitamins-Minerals (AIRBORNE) TBEF Take 1 tablet by mouth daily as needed (for immune system support). Immune Health/Support    nystatin cream (MYCOSTATIN) Apply 1 application topically 2 (two) times daily as needed for dry skin.   ONETOUCH ULTRA test strip USE 1 STRIP TO CHECK GLUCOSE IN THE MORNING AND 1 AT BEDTIME   repaglinide (PRANDIN) 2 MG tablet Take 1 tablet (2 mg total) by mouth 3 (three) times daily before meals.   rosuvastatin (CRESTOR) 5 MG tablet Take 1 tablet (5 mg total) by mouth 3 (three) times a week.   spironolactone (ALDACTONE) 25 MG tablet Take 1 tablet (25 mg total) by mouth daily.   tetrahydrozoline 0.05 % ophthalmic solution Place 1-2 drops into both eyes 3 (three) times daily as needed (for dry/irritated eyes).   No facility-administered encounter medications on file as of 10/28/2021.    Contacted [name] on 10/29/21 for general disease state and medication adherence call.   Patient is not more than 5 days past due for refill on the following medications per chart history:  Star Medications: Medication Name/mg Last Fill Days Supply Rosuvastatin '5mg'$   09/24/21 90 Entresto 49-51 mg  10/04/21  90 Metformin '500mg'$   08/31/21  100     What concerns do you have about your medications?no concerns at this time  The patient denies side effects with their medications.   How often do you forget or accidentally miss a dose? Never  Do you use a pillbox? Yes  Are you having any problems getting your medications from your pharmacy? No  Has the cost of your medications been a concern? No  Since last visit with CPP, the following interventions have been made. The patient is compliant with taking rosuvastatin 3 times a week. New lipid panel 09/17/21.  The patient has not had an ED visit since last contact.   The patient denies problems with their health.   Patient denies concerns or questions for Charlene Brooke, PharmD at this time.   Counseled patient on:  [place] job taking medications, Importance of taking medication daily without missed doses, Benefits of adherence packaging or a pillbox, and  Access to CCM team for any cost, medication or pharmacy concerns.    Care Gaps: Annual wellness visit in last year? No Most Recent BP reading:112/68  64-P   If Diabetic: Most recent A1C reading: 7.0 06/28/21 Last eye exam / retinopathy screening:UTD Last diabetic foot exam:UTD  Upcoming appointments: CCM appointment on 02/02/22   Charlene Brooke, CPP notified  Avel Sensor, [place]  [phone]

## 2021-11-28 ENCOUNTER — Other Ambulatory Visit: Payer: Self-pay | Admitting: Family Medicine

## 2021-11-29 NOTE — Telephone Encounter (Signed)
Last filled 09-15-21. Started by Dr Lorelei Pont.

## 2021-12-03 ENCOUNTER — Telehealth: Payer: Self-pay

## 2021-12-03 NOTE — Telephone Encounter (Signed)
Attempted to contact the patient in regards to rescheduling with another provider, mailbox is full.

## 2022-01-20 ENCOUNTER — Telehealth: Payer: Self-pay | Admitting: Pharmacist

## 2022-01-20 NOTE — Telephone Encounter (Signed)
[employer] grant approved for Entresto: 12/21/21 - 12/21/22  ID: [id number]   CARD STATUS Active   BIN [id number]   PCN [id number]   PC GROUP [id number]

## 2022-01-26 NOTE — Progress Notes (Signed)
Completed for future refills

## 2022-01-28 ENCOUNTER — Telehealth: Payer: Self-pay

## 2022-01-28 NOTE — Chronic Care Management (AMB) (Signed)
Chronic Care Management Pharmacy Assistant   Name: Ryan Morgan  MRN: [id number] DOB: 04/09/1941  Reason for Encounter: Reminder Call   Conditions to be addressed/monitored: HTN, HLD, and DMII  Medications: Outpatient Encounter Medications as of 01/28/2022  Medication Sig   acarbose (PRECOSE) 25 MG tablet Take 1 tablet (25 mg total) by mouth 3 (three) times daily with meals.   carvedilol (COREG) 6.25 MG tablet Take 1 tablet (6.25 mg total) by mouth 2 (two) times daily with a meal.   cetirizine (ZYRTEC) 10 MG tablet Take 10 mg by mouth 2 (two) times daily.    cholecalciferol (VITAMIN D3) 25 MCG (1000 UNIT) tablet Take 3,000 Units by mouth daily.   clobetasol cream (TEMOVATE) 2.19 % Apply 1 application topically 2 (two) times daily. To affected areas   clopidogrel (PLAVIX) 75 MG tablet Take 1 tablet (75 mg total) by mouth daily.   ENTRESTO 49-51 MG Take 1 tablet by mouth twice daily   ezetimibe (ZETIA) 10 MG tablet TAKE 1 TABLET BY MOUTH ONCE DAILY . APPOINTMENT REQUIRED FOR FUTURE REFILLS   fexofenadine (ALLEGRA) 180 MG tablet Take 180 mg by mouth daily.   fluticasone (FLONASE) 50 MCG/ACT nasal spray Place 2 sprays into both nostrils daily.   gabapentin (NEURONTIN) 100 MG capsule Take 1 capsule by mouth at bedtime   LANCETS ULTRA THIN 30G MISC by Does not apply route. One Touch   metFORMIN (GLUCOPHAGE-XR) 500 MG 24 hr tablet Take 1 tablet (500 mg total) by mouth daily with breakfast.   Multiple Vitamin (MULTIVITAMIN WITH MINERALS) TABS tablet Take 1 tablet by mouth daily.   Multiple Vitamins-Minerals (AIRBORNE) TBEF Take 1 tablet by mouth daily as needed (for immune system support). Immune Health/Support   nystatin cream (MYCOSTATIN) Apply 1 application topically 2 (two) times daily as needed for dry skin.   ONETOUCH ULTRA test strip USE 1 STRIP TO CHECK GLUCOSE IN THE MORNING AND 1 AT BEDTIME   repaglinide (PRANDIN) 2 MG tablet Take 1 tablet (2 mg total) by mouth 3 (three) times daily  before meals.   rosuvastatin (CRESTOR) 5 MG tablet Take 1 tablet (5 mg total) by mouth 3 (three) times a week.   spironolactone (ALDACTONE) 25 MG tablet Take 1 tablet (25 mg total) by mouth daily.   tetrahydrozoline 0.05 % ophthalmic solution Place 1-2 drops into both eyes 3 (three) times daily as needed (for dry/irritated eyes).   No facility-administered encounter medications on file as of 01/28/2022.   [name] was contacted to remind of upcoming telephone visit with Charlene Brooke on 02/02/22 at 11:00. Patient was reminded to have any blood glucose and blood pressure readings available for review at appointment.   Message was left reminding patient of appointment.   CCM referral has been placed prior to visit?  Yes   Star Rating Drugs: Medication:   Last Fill: Day Supply Rosuvastatin '5mg'$                    09/24/21            90 Entresto 49-51 mg                  01/05/22              90 Metformin '500mg'$                     12/04/21              100  Summary: CCM F/U visit -HLD, CAD - Patient reports adherence with rosuvastatin 5 mg 2x per week, he seems to be tolerating well. Last LDL 104 (4/022), he is due for repeat lipid panel -CHF - Entresto PAP was denied however he reports his copay has decreased in 2023 and is reasonable -DM - Followed by endo. A1c 7.0% (increased from 6.5%). Cost has prohibited SGLT-2 in the past, he does not qualify for pt assistance based on income   Recommendations/Changes made from today's visit: -No med changes  Charlene Brooke, CPP notified  Avel Sensor, [place]  [phone]

## 2022-02-02 ENCOUNTER — Ambulatory Visit (INDEPENDENT_AMBULATORY_CARE_PROVIDER_SITE_OTHER): Payer: Medicare HMO | Admitting: Pharmacist

## 2022-02-02 ENCOUNTER — Telehealth: Payer: Self-pay | Admitting: Pharmacist

## 2022-02-02 DIAGNOSIS — I5022 Chronic systolic (congestive) heart failure: Secondary | ICD-10-CM

## 2022-02-02 DIAGNOSIS — G629 Polyneuropathy, unspecified: Secondary | ICD-10-CM | POA: Insufficient documentation

## 2022-02-02 DIAGNOSIS — I10 Essential (primary) hypertension: Secondary | ICD-10-CM

## 2022-02-02 DIAGNOSIS — I25118 Atherosclerotic heart disease of native coronary artery with other forms of angina pectoris: Secondary | ICD-10-CM

## 2022-02-02 DIAGNOSIS — E7800 Pure hypercholesterolemia, unspecified: Secondary | ICD-10-CM

## 2022-02-02 DIAGNOSIS — G6289 Other specified polyneuropathies: Secondary | ICD-10-CM

## 2022-02-02 DIAGNOSIS — E11311 Type 2 diabetes mellitus with unspecified diabetic retinopathy with macular edema: Secondary | ICD-10-CM

## 2022-02-02 MED ORDER — GABAPENTIN 100 MG PO CAPS: 200.0000 mg | ORAL_CAPSULE | Freq: Every day | ORAL | 5 refills | Status: DC

## 2022-02-02 NOTE — Progress Notes (Signed)
Chronic Care Management Pharmacy Note  02/02/2022 Name:  Ryan Morgan MRN:  [id number] DOB:  [birth year]  Summary: CCM F/U visit -HLD, CAD - LDL improved significantly with intermittent-dosed rosuvastatin -CHF - Entresto assistance obtained with [employer] for $0 copay through July 2024 -DM - Followed by endo. A1c 7.0% (from 05/2021); He is overdue for follow up and needs to schedule with new provider -Neuropathy: pt reports gabapentin 100 mg is not effective, he asks about increasing dose  Recommendations/Changes made from today's visit: -Advised pt to contact endocrine to schedule with new provider -Recommend trial of gabapentin 200 mg at bedtime - consulting with PCP  Plan: -[place] will call patient 3 months for adherence review -Pharmacist follow up televisit scheduled for 6 months    Subjective: Ryan Morgan is an 81 y.o. year old male who is a primary patient of Tower, Wynelle Fanny, MD.  The CCM team was consulted for assistance with disease management and care coordination needs.    Engaged with patient by telephone for follow up visit in response to provider referral for pharmacy case management and/or care coordination services.   Consent to Services:  The patient was given information about Chronic Care Management services, agreed to services, and gave verbal consent prior to initiation of services.  Please see initial visit note for detailed documentation.   Patient Care Team: Tower, Wynelle Fanny, MD as PCP - General (Family Medicine) Rockey Situ, Kathlene November, MD as Consulting Physician (Cardiology) Oneta Rack, MD as Consulting Physician (Dermatology) Juanito Doom, MD as Consulting Physician (Pulmonary Disease) Charlton Haws, [facility] as Pharmacist (Pharmacist)  Recent office visits:  09/17/21 Dr Glori Bickers OV: ear pain; LDL 28; updated rosuvastatin to 3x weekly; d/c nebivolol (side effect). Rx'd Flonase nasal spray.  05/18/21 Dr Glori Bickers VV: covid positive.  Rx'd molnupiravir. 03/15/21 Dr Lorelei Pont OV: f/u back pain. Rx'd gabapentin 100 mg HS. 01/06/21 Dr Lorelei Pont OV: acute back pain; Rx'd prednisone burst.   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 06/28/21 Dr Silverio Decamp (GI): referral for colonoscopy 06/28/21 Dr Loanne Drilling (endocrine): f/u DM. A1c 7.0. No med changes. 04/16/21 Dr Rockey Situ (cardiology): f/u CAD. Retry low dose statin - rosuvastatin 5 mg    Hospital visits:  None in previous 6 months   Objective:  Lab Results  Component Value Date   CREATININE 0.90 12/03/2019   BUN 17 12/03/2019   GFR 81.75 12/03/2019   GFRNONAA >60 09/12/2019   GFRAA >60 09/12/2019   NA 140 12/03/2019   K 4.3 12/03/2019   CALCIUM 8.7 12/03/2019   CO2 29 12/03/2019   GLUCOSE 171 (H) 12/03/2019    Lab Results  Component Value Date/Time   HGBA1C 7.0 (A) 06/28/2021 10:29 AM   HGBA1C 6.5 (A) 11/25/2020 09:40 AM   HGBA1C 8.4 (H) 11/26/2018 11:27 AM   HGBA1C 7.6 (H) 08/24/2018 09:25 PM   FRUCTOSAMINE 333 (H) 06/15/2017 03:51 PM   GFR 81.75 12/03/2019 09:39 AM   GFR 88.77 11/26/2018 11:27 AM   MICROALBUR 1.8 11/26/2018 11:27 AM   MICROALBUR <0.7 11/20/2017 11:26 AM    Last diabetic Eye exam:  Lab Results  Component Value Date/Time   HMDIABEYEEXA No Retinopathy 03/31/2021 12:00 AM    Last diabetic Foot exam: Followed by endocrinology   Lab Results  Component Value Date   CHOL 98 09/17/2021   HDL 37.50 (L) 09/17/2021   LDLCALC 28 09/17/2021   LDLDIRECT 100.0 05/17/2017  TRIG 161.0 (H) 09/17/2021   CHOLHDL 3 09/17/2021       Latest Ref Rng & Units 12/03/2019    9:39 AM 11/26/2018   11:27 AM 11/20/2017   11:26 AM  Hepatic Function  Total Protein 6.0 - 8.3 g/dL 5.8  5.9  6.1   Albumin 3.5 - 5.2 g/dL 3.9  3.9  4.0   AST 0 - 37 U/L '13  12  14   ' ALT 0 - 53 U/L '12  13  14   ' Alk Phosphatase 39 - 117 U/L 62  71  64   Total Bilirubin 0.2 - 1.2 mg/dL 0.6  0.5  0.5     Lab Results   Component Value Date/Time   TSH 1.30 12/03/2019 09:39 AM   TSH 1.115 08/24/2018 03:28 PM   TSH 0.91 11/20/2017 11:26 AM       Latest Ref Rng & Units 12/03/2019    9:39 AM 09/12/2019    9:28 AM 11/26/2018   11:27 AM  CBC  WBC 4.0 - 10.5 K/uL 3.5  4.6  3.5   Hemoglobin 13.0 - 17.0 g/dL 12.3  12.4  13.1   Hematocrit 39.0 - 52.0 % 35.2  36.5  38.3   Platelets 150.0 - 400.0 K/uL 151.0  159  175.0     Lab Results  Component Value Date/Time   VD25OH 27.61 (L) 06/02/2014 08:44 AM   Clinical ASCVD: Yes  The ASCVD Risk score (Arnett DK, et al., 2019) failed to calculate for the following reasons:   The valid total cholesterol range is 130 to 320 mg/dL       05/26/2021    1:38 PM 12/03/2019    1:16 PM 11/23/2018    9:40 AM  Depression screen PHQ 2/9  Decreased Interest 0 0 0  Down, Depressed, Hopeless 0 0 0  PHQ - 2 Score 0 0 0  Altered sleeping  0 0  Tired, decreased energy  0 0  Change in appetite  0 0  Feeling bad or failure about yourself   0 0  Trouble concentrating  0 0  Moving slowly or fidgety/restless  0 0  Suicidal thoughts  0 0  PHQ-9 Score  0 0  Difficult doing work/chores  Not difficult at all Not difficult at all    Social History   Tobacco Use  Smoking Status Former   Packs/day: 0.50   Years: 20.00   Total pack years: 10.00   Types: Cigarettes   Quit date: 05/30/1978   Years since quitting: 43.7  Smokeless Tobacco Never   BP Readings from Last 3 Encounters:  09/17/21 112/68  08/04/21 123/69  06/28/21 126/68   Pulse Readings from Last 3 Encounters:  09/17/21 64  08/04/21 65  06/28/21 84   Wt Readings from Last 3 Encounters:  09/17/21 230 lb 2 oz (104.4 kg)  08/04/21 230 lb (104.3 kg)  06/28/21 230 lb (104.3 kg)   BMI Readings from Last 3 Encounters:  09/17/21 33.02 kg/m  08/04/21 33.00 kg/m  06/28/21 32.54 kg/m    Assessment/Interventions: Review of patient past medical history, allergies, medications, health status, including review of  consultants reports, laboratory and other test data, was performed as part of comprehensive evaluation and provision of chronic care management services.   SDOH:  (Social Determinants of Health) assessments and interventions performed: Yes SDOH Interventions    Flowsheet Row Chronic Care Management from 02/02/2022 in [place] at [place] Management from 12/28/2020 in [facility]  at [place] from 12/03/2019 in [place] at [place] from 11/23/2018 in [place] at [place]  SDOH Interventions      Depression Interventions/Treatment  -- -- VVO1-6 Score <4 Follow-up Not Indicated PHQ2-9 Score <4 Follow-up Not Indicated  Financial Strain Interventions Other (Comment)  [Entresto - [employer] grant approved] Other (Comment)  [Entresto assistance] -- --       SDOH Screenings   Food Insecurity: No Food Insecurity (12/03/2019)  Housing: Low Risk  (05/26/2021)  Transportation Needs: No Transportation Needs (05/26/2021)  Alcohol Screen: Low Risk  (05/26/2021)  Depression (PHQ2-9): Low Risk  (05/26/2021)  Financial Resource Strain: Low Risk  (02/02/2022)  Physical Activity: Inactive (05/26/2021)  Social Connections: Moderately Isolated (05/26/2021)  Stress: No Stress Concern Present (05/26/2021)  Tobacco Use: Medium Risk (09/17/2021)    CCM Care Plan  Allergies  Allergen Reactions   Benicar Hct [Olmesartan Medoxomil-Hctz] Itching    Painful and itchy knots on palms of hands   Bystolic [Nebivolol Hcl] Other (See Comments)    headache   Dapsone Hives   Lovaza [Omega-3-Acid Ethyl Esters] Other (See Comments)    Unknown reaction   Niaspan [Niacin Er] Other (See Comments)    flushing   Peanut-Containing Drug Products     Rash on hands from peanuts   Septra [Bactrim] Hives    Medications Reviewed Today     Reviewed by Charlton Haws, RPH (Pharmacist) on 02/02/22 at 1150  Med List Status: <None>    Medication Order Taking? Sig Documenting Provider Last Dose Status Informant  acarbose (PRECOSE) 25 MG tablet [id number] Yes Take 1 tablet (25 mg total) by mouth 3 (three) times daily with meals. Renato Shin, MD Taking Active   carvedilol (COREG) 6.25 MG tablet [id number] Yes Take 1 tablet (6.25 mg total) by mouth 2 (two) times daily with a meal. Gollan, Kathlene November, MD Taking Active   cetirizine (ZYRTEC) 10 MG tablet [id number] Yes Take 10 mg by mouth 2 (two) times daily.  [provider] Taking Active Multiple Informants  cholecalciferol (VITAMIN D3) 25 MCG (1000 UNIT) tablet [id number] Yes Take 3,000 Units by mouth daily. [provider] Taking Active Multiple Informants  clobetasol cream (TEMOVATE) 0.05 % [id number] Yes Apply 1 application topically 2 (two) times daily. To affected areas Ria Bush, MD Taking Active Multiple Informants  clopidogrel (PLAVIX) 75 MG tablet [id number] Yes Take 1 tablet (75 mg total) by mouth daily. Minna Merritts, MD Taking Active   ENTRESTO 49-51 MG [id number] Yes Take 1 tablet by mouth twice daily Gollan, Kathlene November, MD Taking Active   ezetimibe (ZETIA) 10 MG tablet [id number] Yes TAKE 1 TABLET BY MOUTH ONCE DAILY . APPOINTMENT REQUIRED FOR FUTURE REFILLS Minna Merritts, MD Taking Active   fexofenadine (ALLEGRA) 180 MG tablet [id number] Yes Take 180 mg by mouth daily. [provider] Taking Active   fluticasone (FLONASE) 50 MCG/ACT nasal spray [id number] Yes Place 2 sprays into both nostrils daily. Tower, Wynelle Fanny, MD Taking Active   gabapentin (NEURONTIN) 100 MG capsule [id number] Yes Take 1 capsule by mouth at bedtime Tower, Wynelle Fanny, MD Taking Active   LANCETS ULTRA THIN [place] [id number] Yes by Does not apply route. One Touch [provider] Taking Active Multiple Informants  metFORMIN (GLUCOPHAGE-XR) 500 MG 24 hr tablet [id number] Yes Take 1 tablet (500 mg total) by mouth daily with breakfast. Renato Shin, MD Taking Active    Multiple Vitamin (MULTIVITAMIN WITH MINERALS) TABS tablet [id number]  Yes Take 1 tablet by mouth daily. [provider] Taking Active Multiple Informants  Multiple Vitamins-Minerals (AIRBORNE) TBEF [id number] Yes Take 1 tablet by mouth daily as needed (for immune system support). Immune Health/Support [provider] Taking Active Multiple Informants  nystatin cream (MYCOSTATIN) [id number] Yes Apply 1 application topically 2 (two) times daily as needed for dry skin. [provider] Taking Active Multiple Informants  ONETOUCH ULTRA test strip [id number] Yes USE 1 STRIP TO CHECK GLUCOSE IN THE MORNING AND 1 AT BEDTIME Renato Shin, MD Taking Active   repaglinide (PRANDIN) 2 MG tablet [id number] Yes Take 1 tablet (2 mg total) by mouth 3 (three) times daily before meals. Renato Shin, MD Taking Active   rosuvastatin (CRESTOR) 5 MG tablet [id number] Yes Take 1 tablet (5 mg total) by mouth 3 (three) times a week. Tower, Wynelle Fanny, MD Taking Active   spironolactone (ALDACTONE) 25 MG tablet [id number] Yes Take 1 tablet (25 mg total) by mouth daily. Minna Merritts, MD Taking Active   tetrahydrozoline 0.05 % ophthalmic solution [id number] Yes Place 1-2 drops into both eyes 3 (three) times daily as needed (for dry/irritated eyes). 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/31/2019   Right leg pain 12/17/2018   Ischemic cardiomyopathy 09/10/2018   Hip joint effusion, left 05/03/2018   Laceration of arm 12/04/2017   Current chronic use of systemic steroids 11/15/2016   Routine general medical examination at a health care facility 11/09/2015   Hearing loss 11/09/2015   History of colonic polyps 11/09/2015   Rash of hands 03/10/2015   Right ear pain 06/09/2014   Colon cancer  screening 06/09/2014   Blepharitis, bilateral 06/02/2014   Benign paroxysmal positional vertigo 12/14/2013   Bursitis of right hip 10/17/2013   Coronary artery disease of native artery of native heart with stable angina pectoris ([place]) 03/19/2013   Encounter for Medicare annual wellness exam 03/05/2013   Adverse effect of glucocorticoid or synthetic analogue 03/05/2013   Osteopenia 03/05/2013   Class 1 obesity with serious comorbidity and body mass index (BMI) of 33.0 to 33.9 in adult 07/15/2011   Prostate cancer screening 07/10/2011   Hypertension 08/30/2010   Hyperlipidemia associated with type 2 diabetes mellitus ([place]) 08/30/2010   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/30/2010   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   Benign essential hypertension 02/13/2008   Pure hypercholesterolemia 02/13/2008    Immunization History  Administered Date(s) Administered   Fluad Quad(high Dose 65+) 03/13/2019, 03/15/2021   Influenza Split 05/16/2011, 02/16/2012   Influenza, High Dose Seasonal PF 02/28/2017   Influenza,inj,Quad PF,6+ Mos 03/05/2013, 03/10/2014, 05/20/2015, 03/30/2016, 05/10/2018   PFIZER(Purple Top)SARS-COV-2 Vaccination 07/12/2019, 08/02/2019, 01/17/2020   Pneumococcal Conjugate-13 06/09/2014   Pneumococcal Polysaccharide-23 07/15/2011   Td 11/29/2017    Conditions to be addressed/monitored:  Hypertension, Hyperlipidemia, Diabetes, Heart Failure, and Coronary Artery Disease  Care Plan : [place]  Updates made by Charlton Haws, [place] since 02/02/2022 12:00 AM     Problem: Hypertension, Hyperlipidemia, Diabetes, Heart Failure, and Coronary Artery Disease   Priority: High     Long-Range Goal: Disease Management   Start Date: 12/28/2020  This Visit's Progress: On track  Priority: High  Note:   Current Barriers:  Unable to independently monitor therapeutic  efficacy  Pharmacist Clinical Goal(s):  Patient will achieve adherence to monitoring  guidelines and medication adherence to achieve therapeutic efficacy through collaboration with PharmD and provider.   Interventions: 1:1 collaboration with Tower, Wynelle Fanny, MD regarding development and update of comprehensive plan of care as evidenced by provider attestation and co-signature Inter-disciplinary care team collaboration (see longitudinal plan of care) Comprehensive medication review performed; medication list updated in electronic medical record  Diabetes (A1c goal <7%) -Controlled - A1c 7.0% (05/2021); he has not been able to afford brand name medications and did not qualify for pt assistance -Followed by endocrine (Dr Loanne Drilling - has not yet established with new provider, pt is overdue for follow up) -Current home BG readings: 90s -Current medications: Repaglinide 2 mg TID w/ meals - Appropriate, Effective, Safe, Accessible Acarbose 25 mg 1/2 tab TID w/ meals - Appropriate, Effective, Safe, Accessible Metformin 500 mg ER daily - Appropriate, Effective, Safe, Accessible -Medications previously tried: Jardiance, Farxiga, Celesta Gentile (all cost) -Reviewed benefits of metformin; discussed A1c is overdue, advised pt to contact endocrine to schedule with new provider -Recommended to continue current medication  Hyperlipidemia: (LDL goal < 70) -Controlled - LDL 28 (08/2021) improved from 104 on rosuvastatin -Hx CAD -Current treatment: Rosuvastatin 5 mg 3x weekly - Appropriate, Effective, Safe, Accessible Ezetimibe 10 mg daily -  Appropriate, Effective, Safe, Accessible Clopidogrel 75 mg daily - Appropriate, Effective, Safe, Accessible -Medications previously tried: Atorvastatin (muscle aches) -Educated on Cholesterol goals; Exercise goal of 150 minutes per week; -Recommend to continue current medication  Heart Failure / Hypertension (BP goal < 130/80) -Controlled - BP at goal -Last ejection fraction: 35-40% (Date: 11/12/19) - improved from 14-78% -HF type: Systolic; NYHA Class: I (no  actitivty limitation) -Current home BP/HR readings: 151/82, 122/73, 129/74, 131/77, 139/85; avg 127/78 -Entresto PAP was denied due to income too high per patient; he reports copay is lower and more reasonable in 2023 -Current treatment: Spironolactone 25 mg daily - Appropriate, Effective, Safe, Accessible Entresto 49-51 mg BID - Appropriate, Effective, Safe, Accessible Carvedilol 6.25 mg BID -Appropriate, Effective, Safe, Accessible -Medications previously tried: none  -Current exercise habits: activities include gardening, mowing grass -Educated on Importance of weighing daily; if you gain more than 3 pounds in one day or 5 pounds in one week, call office -Recommended to continue current medication  Patient Goals/Self-Care Activities Patient will:  - take medications as prescribed as evidenced by patient report and record review focus on medication adherence by routine check glucose daily, document, and provide at future appointments check blood pressure weekly, document, and provide at future appointments      Medication Assistance: None required.  Patient affirms current coverage meets needs.  Compliance/Adherence/Medication fill history: Care Gaps: A1c (12/26/21) Foot exam (11/25/21)  Star-Rating Drugs: Metformin XR 500 mg - PDC 100% Repaglinide 2 mg - PDC 100% Acarbose 25 mg - PDC 100% Entresto 49-27m - PDC 99% Rosuvastatin 175m- PDC 50% (LF 09/24/21 - #90) - taking 3x weekly, not daily   Medication Access: Within the past 30 days, how often has patient missed a dose of medication? 0 Is a pillbox or other method used to improve adherence? Yes  Factors that may affect medication adherence? no barriers identified Are meds synced by current pharmacy? No  Are meds delivered by current pharmacy? No  Does patient experience delays in picking up medications due to transportation concerns? No   [facility] Services Reviewed: Is patient disadvantaged to use [facility]?:  No  Current Rx insurance plan:  [employer] Name and location of Current pharmacy:  [facility] [address], [place] - [place] [address] Phone: [phone] Fax: [phone]  [facility] services reviewed with patient today?: No  Patient requests to transfer care to [facility]?: No  Reason patient declined to change pharmacies: Loyalty to other pharmacy/Patient preference   Care Plan and Follow Up Patient Decision:  Patient agrees to Care Plan and Follow-up.  Follow Up Plan: Telephone follow up appointment with care management team member scheduled for: 6 months  Charlene Brooke, PharmD, BCACP Clinical Pharmacist [facility] at [facility] [phone]

## 2022-02-02 NOTE — Telephone Encounter (Signed)
I sent it  Alert [place] if this does not help

## 2022-02-02 NOTE — Telephone Encounter (Signed)
Patient reports gabapentin 100 mg/day if not effective for neuropathy, he requests to increase to 200 mg daily. Routing to PCP for approval.  Preferred pharmacy: [facility] [address] [place] - [place] [address] Phone: [phone] Fax: [phone]

## 2022-02-02 NOTE — Patient Instructions (Signed)
Visit Information  Phone number for Pharmacist: [phone]   Goals Addressed   None     Care Plan : [place]  Updates made by Charlton Haws, [place] since 02/02/2022 12:00 AM     Problem: Hypertension, Hyperlipidemia, Diabetes, Heart Failure, and Coronary Artery Disease   Priority: High     Long-Range Goal: Disease Management   Start Date: 12/28/2020  This Visit's Progress: On track  Priority: High  Note:   Current Barriers:  Unable to independently monitor therapeutic efficacy  Pharmacist Clinical Goal(s):  Patient will achieve adherence to monitoring guidelines and medication adherence to achieve therapeutic efficacy through collaboration with PharmD and provider.   Interventions: 1:1 collaboration with Tower, Wynelle Fanny, MD regarding development and update of comprehensive plan of care as evidenced by provider attestation and co-signature Inter-disciplinary care team collaboration (see longitudinal plan of care) Comprehensive medication review performed; medication list updated in electronic medical record  Diabetes (A1c goal <7%) -Controlled - A1c 7.0% (05/2021); he has not been able to afford brand name medications and did not qualify for pt assistance -Followed by endocrine (Dr Loanne Drilling - has not yet established with new provider, pt is overdue for follow up) -Current home BG readings: 90s -Current medications: Repaglinide 2 mg TID w/ meals - Appropriate, Effective, Safe, Accessible Acarbose 25 mg 1/2 tab TID w/ meals - Appropriate, Effective, Safe, Accessible Metformin 500 mg ER daily - Appropriate, Effective, Safe, Accessible -Medications previously tried: Jardiance, Farxiga, Celesta Gentile (all cost) -Reviewed benefits of metformin; discussed A1c is overdue, advised pt to contact endocrine to schedule with new provider -Recommended to continue current medication  Hyperlipidemia: (LDL goal < 70) -Controlled - LDL 28 (08/2021) improved from 104 on  rosuvastatin -Hx CAD -Current treatment: Rosuvastatin 5 mg 3x weekly - Appropriate, Effective, Safe, Accessible Ezetimibe 10 mg daily -  Appropriate, Effective, Safe, Accessible Clopidogrel 75 mg daily - Appropriate, Effective, Safe, Accessible -Medications previously tried: Atorvastatin (muscle aches) -Educated on Cholesterol goals; Exercise goal of 150 minutes per week; -Recommend to continue current medication  Heart Failure / Hypertension (BP goal < 130/80) -Controlled - BP at goal -Last ejection fraction: 35-40% (Date: 11/12/19) - improved from 99-24% -HF type: Systolic; NYHA Class: I (no actitivty limitation) -Current home BP/HR readings: 151/82, 122/73, 129/74, 131/77, 139/85; avg 127/78 -Entresto PAP was denied due to income too high per patient; he reports copay is lower and more reasonable in 2023 -Current treatment: Spironolactone 25 mg daily - Appropriate, Effective, Safe, Accessible Entresto 49-51 mg BID - Appropriate, Effective, Safe, Accessible Carvedilol 6.25 mg BID -Appropriate, Effective, Safe, Accessible -Medications previously tried: none  -Current exercise habits: activities include gardening, mowing grass -Educated on Importance of weighing daily; if you gain more than 3 pounds in one day or 5 pounds in one week, call office -Recommended to continue current medication  Patient Goals/Self-Care Activities Patient will:  - take medications as prescribed as evidenced by patient report and record review focus on medication adherence by routine check glucose daily, document, and provide at future appointments check blood pressure weekly, document, and provide at future appointments       Patient verbalizes understanding of instructions and care plan provided today and agrees to view in [place]. Active MyChart status and patient understanding of how to access instructions and care plan via MyChart confirmed with patient.    Telephone follow up appointment with  pharmacy team member scheduled for: 6 months  Charlene Brooke, PharmD, BCACP Clinical Pharmacist [facility] at  [place] [phone]

## 2022-02-26 DIAGNOSIS — I5020 Unspecified systolic (congestive) heart failure: Secondary | ICD-10-CM

## 2022-02-26 DIAGNOSIS — Z7984 Long term (current) use of oral hypoglycemic drugs: Secondary | ICD-10-CM | POA: Diagnosis not present

## 2022-02-26 DIAGNOSIS — I110 Hypertensive heart disease with heart failure: Secondary | ICD-10-CM | POA: Diagnosis not present

## 2022-02-26 DIAGNOSIS — E785 Hyperlipidemia, unspecified: Secondary | ICD-10-CM | POA: Diagnosis not present

## 2022-02-26 DIAGNOSIS — E1159 Type 2 diabetes mellitus with other circulatory complications: Secondary | ICD-10-CM | POA: Diagnosis not present

## 2022-03-01 NOTE — Progress Notes (Unsigned)
    Ryan Mariani T. Romen Yutzy, MD, Ryan Morgan at [facility] [name], [address]  Phone: [phone]  FAX: [place] - 81 y.o. male  MRN [id number]  Date of Birth: Oct 19, 1940  Date: 03/02/2022  PCP: Ryan Greenspan, MD  Referral: Ryan Greenspan, MD  No chief complaint on file.  Subjective:   Ryan Morgan is a 81 y.o. very pleasant male patient with There is no height or weight on file to calculate BMI. who presents with the following:  Ryan Morgan is here with wrist pain:    Review of Systems is noted in the HPI, as appropriate  Objective:   There were no vitals taken for this visit.  GEN: No acute distress; alert,appropriate. PULM: Breathing comfortably in no respiratory distress PSYCH: Normally interactive.   Laboratory and Imaging Data:  Assessment and Plan:   ***

## 2022-03-02 ENCOUNTER — Encounter: Payer: Self-pay | Admitting: Family Medicine

## 2022-03-02 ENCOUNTER — Ambulatory Visit (INDEPENDENT_AMBULATORY_CARE_PROVIDER_SITE_OTHER): Payer: Medicare HMO | Admitting: Family Medicine

## 2022-03-02 VITALS — BP 102/68 | HR 58 | Temp 97.4°F | Ht 70.0 in | Wt 230.2 lb

## 2022-03-02 DIAGNOSIS — E11311 Type 2 diabetes mellitus with unspecified diabetic retinopathy with macular edema: Secondary | ICD-10-CM

## 2022-03-02 DIAGNOSIS — M25531 Pain in right wrist: Secondary | ICD-10-CM | POA: Diagnosis not present

## 2022-03-02 MED ORDER — PREDNISONE 10 MG PO TABS: ORAL_TABLET | ORAL | 0 refills | Status: DC

## 2022-03-02 NOTE — Progress Notes (Signed)
Chiquitta Matty T. Roque Schill, MD, [place] at [facility] [place], [address]  Phone: [phone]  FAX: [place] - 81 y.o. male  MRN [id number]  Date of Birth: Oct 20, 1940  Date: 03/02/2022  PCP: Abner Greenspan, MD  Referral: Abner Greenspan, MD  Chief Complaint  Patient presents with   Wrist Pain    Right   Subjective:   Ryan Morgan is a 81 y.o. very pleasant male patient with Body mass index is 33.04 kg/m. who presents with the following:  He is a very pleasant well-known gentleman, and he presents today with some right-sided wrist pain.  Over the weekend he was doing a lot of work outside in the yard and he subsequently was pulling a lot of weeds with his hands.  This was on Saturday, and the following day he did develop a lot of wrist pain and swelling.  He also wonders if he could have scratched his skin, and he does have a scratch or possibly bite mark near the wrist.  There is no redness, warmth, and this swelling and pain has improved somewhat since the initial pain.  He did take 30 mg of prednisone on Saturday, and that did seem to help.  He does think that today it is slightly worse than it was last night.  Review of Systems is noted in the HPI, as appropriate  Objective:   BP 102/68   Pulse (!) 58   Temp (!) 97.4 F (36.3 C) (Oral)   Ht '5\' 10"'$  (1.778 m)   Wt 230 lb 4 oz (104.4 kg)   SpO2 97%   BMI 33.04 kg/m   GEN: No acute distress; alert,appropriate. PULM: Breathing comfortably in no respiratory distress PSYCH: Normally interactive.   Right wrist does have a small effusion.  Nontender throughout all fingers, DIP, PIP, and MCP joints.  There is a fullness and some pain with palpation of the true wrist joint.  There is some pain with extension and to a lesser extent flexion.  No dramatic pain with ulnar and radial deviation.  He does have a good composite fist and he is able to grip 5/5.   Sensation is intact, and he is otherwise neurovascularly intact.  Full strength with extension and flexion.  Full range of motion, flexion, and extension as well as strength at the elbow on the right  Laboratory and Imaging Data:  Assessment and Plan:     ICD-10-CM   1. Acute pain of right wrist  M25.531     2. 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  Doubt this has much to do with his scratch noted on the hand, and suspect this is more overuse from gardening and pulling weeds.  Normal blood sugars recently, I do think he will probably tolerate a short course of some steroids.  Medication Management during today's office visit: Meds ordered this encounter  Medications   predniSONE (DELTASONE) 10 MG tablet    Sig: Take 3 tabs po for 3 days, then 2 tabs po for 3 days    Dispense:  15 tablet    Refill:  0   There are no discontinued medications.  Orders placed today for conditions managed today: No orders of the defined  types were placed in this encounter.   Disposition: No follow-ups on file.  Dragon [employer] One speech-to-text software was used for transcription in this dictation.  Possible transcriptional errors can occur using Editor, commissioning.   Signed,  Maud Deed. Stormi Vandevelde, MD   Outpatient Encounter Medications as of 03/02/2022  Medication Sig   acarbose (PRECOSE) 25 MG tablet Take 1 tablet (25 mg total) by mouth 3 (three) times daily with meals.   carvedilol (COREG) 6.25 MG tablet Take 1 tablet (6.25 mg total) by mouth 2 (two) times daily with a meal.   cetirizine (ZYRTEC) 10 MG tablet Take 10 mg by mouth 2 (two) times daily.    cholecalciferol (VITAMIN D3) 25 MCG (1000 UNIT) tablet Take 3,000 Units by mouth daily.   clobetasol cream (TEMOVATE) 5.17 % Apply 1  application topically 2 (two) times daily. To affected areas   clopidogrel (PLAVIX) 75 MG tablet Take 1 tablet (75 mg total) by mouth daily.   ENTRESTO 49-51 MG Take 1 tablet by mouth twice daily   ezetimibe (ZETIA) 10 MG tablet TAKE 1 TABLET BY MOUTH ONCE DAILY . APPOINTMENT REQUIRED FOR FUTURE REFILLS   fexofenadine (ALLEGRA) 180 MG tablet Take 180 mg by mouth daily.   fluticasone (FLONASE) 50 MCG/ACT nasal spray Place 2 sprays into both nostrils daily.   gabapentin (NEURONTIN) 100 MG capsule Take 2 capsules (200 mg total) by mouth at bedtime.   LANCETS ULTRA THIN 30G MISC by Does not apply route. One Touch   metFORMIN (GLUCOPHAGE-XR) 500 MG 24 hr tablet Take 1 tablet (500 mg total) by mouth daily with breakfast.   Multiple Vitamin (MULTIVITAMIN WITH MINERALS) TABS tablet Take 1 tablet by mouth daily.   Multiple Vitamins-Minerals (AIRBORNE) TBEF Take 1 tablet by mouth daily as needed (for immune system support). Immune Health/Support   nystatin cream (MYCOSTATIN) Apply 1 application topically 2 (two) times daily as needed for dry skin.   ONETOUCH ULTRA test strip USE 1 STRIP TO CHECK GLUCOSE IN THE MORNING AND 1 AT BEDTIME   predniSONE (DELTASONE) 10 MG tablet Take 3 tabs po for 3 days, then 2 tabs po for 3 days   repaglinide (PRANDIN) 2 MG tablet Take 1 tablet (2 mg total) by mouth 3 (three) times daily before meals.   rosuvastatin (CRESTOR) 5 MG tablet Take 1 tablet (5 mg total) by mouth 3 (three) times a week.   spironolactone (ALDACTONE) 25 MG tablet Take 1 tablet (25 mg total) by mouth daily.   tetrahydrozoline 0.05 % ophthalmic solution Place 1-2 drops into both eyes 3 (three) times daily as needed (for dry/irritated eyes).   No facility-administered encounter medications on file as of 03/02/2022.

## 2022-03-28 ENCOUNTER — Telehealth: Payer: Self-pay | Admitting: Family Medicine

## 2022-03-28 MED ORDER — GABAPENTIN 100 MG PO CAPS: 200.0000 mg | ORAL_CAPSULE | Freq: Every day | ORAL | 3 refills | Status: DC

## 2022-03-28 NOTE — Telephone Encounter (Signed)
Refill of gabapentin to [facility] Wife had called about it

## 2022-04-11 ENCOUNTER — Other Ambulatory Visit: Payer: Self-pay

## 2022-04-11 DIAGNOSIS — E11311 Type 2 diabetes mellitus with unspecified diabetic retinopathy with macular edema: Secondary | ICD-10-CM

## 2022-04-11 DIAGNOSIS — H02054 Trichiasis without entropian left upper eyelid: Secondary | ICD-10-CM | POA: Diagnosis not present

## 2022-04-11 DIAGNOSIS — E113392 Type 2 diabetes mellitus with moderate nonproliferative diabetic retinopathy without macular edema, left eye: Secondary | ICD-10-CM | POA: Diagnosis not present

## 2022-04-11 LAB — HM DIABETES EYE EXAM

## 2022-04-11 MED ORDER — ONETOUCH ULTRA VI STRP: ORAL_STRIP | 0 refills | Status: DC

## 2022-04-13 ENCOUNTER — Other Ambulatory Visit: Payer: Self-pay | Admitting: Cardiovascular Disease

## 2022-04-23 NOTE — Progress Notes (Signed)
Date:  04/25/2022   ID:  Ryan Morgan, DOB November 03, 1940, MRN [id number]  Patient Location:  [place] [address]   Provider location:   [facility], [place] office  PCP:  Ryan Greenspan, MD  Cardiologist:  Ryan Morgan [facility]  Chief Complaint  Patient presents with   12 month follow up     Patient c/o occasional chest pain and shortness of breath. Medications reviewed by the patient verbally.     History of Present Illness:    Ryan Morgan is a 81 y.o. male with  past medical history of DM,  smoking hx,  coronary artery disease,  stent placed to his LAD in 2000,  (3.0 x 18 mm) repeat catheterization in January 2003 with 40% LAD, 50% diagonal disease, history of Wegener's granulomatosis, on chronic steroids diabetes. Prior thoracotomy surgery on the left for lung nodule, chronic pain on the left that is episodic, stable over several years Chronic shortness of breath, sedentary lifestyle, obesity/deconditioning Stent October 2018 for shortness of breath, chest tightness, fatigue Echocardiogram September 12, 2017 Ejection fraction 30 to 35% 2018:Successful angioplasty and drug-eluting stent placement to the ostial Morgan posterior AV groove artery. Ejection fraction 35 to 40% in 2021 who presents for follow up of his CAD and shortness of breath   Last seen by myself in clinic November 2022 In follow-up today reports doing well Chronic back pain with sciatica No regular walking program  Rare sharp chest pain, not associated with exertion Chronic shortness of breath on exertion Weight running high but slow trend downward over the past 5 years  Likes to go camping in the RV,   Previously tried Youth worker, not on these medications possibly secondary to price but unable to exclude side effects Not on either of these medications at this time  Receiving Entresto through a grant, signed up by pharmacy  Lab work reviewed A1C  7.0 Total cholesterol less than 100  Last echocardiogram June 2021 ejection fraction 35 to 40% up from 30 to 35%   EKG personally reviewed by myself on todays visit Shows NSR rate 69 bpm, old inferior MI, unable to exclude old anterior MI No significant change from prior EKGs  Other past medical hx March 2020 Did not feel well, may have had some nausea, diaphoresis Got inside, nearsyncope  Felt by the hospital team that he could have had a vagal event given the  prodrome of nausea and sweating Tropon negative x3.  Lab work looks normal, no sense of dehydration He does not remember much of a prodrome of nausea or sweating before feeling dizzy Heart rate 55 and higher in the hospital, into the 60s  Prior CV studies:   The following studies were reviewed today:  Other past medical history reviewed Echocardiogram October 2018 ejection fraction 30 to 35%   Stress test October 2018 prior MI Prior MI, no significant ischemia Ejection fraction 36%   Cardiac catheterization October 2018 Patent LAD stent mild to moderate in-stent restenosis Occluded diagonal #1 Occluded Morgan PDA with left-to-Morgan collaterals Severe disease Morgan posterior AV groove artery with collaterals Ejection fraction 40% Successful angioplasty stent to ostial Morgan posterior AV groove artery Ejection fraction at that time 40%   Echocardiogram September 12, 2017 Ejection fraction 30 to 35%   Stress test from January 2009 was a treadmill study, ejection fraction estimated at 45%, with medium-sized area of moderate to severe hypoperfusion involving the septal region.  Past Medical History:  Diagnosis Date   Cancer ([place])    Basal cell ca of nose   Clotting disorder (HCC)    to prevent CVS per pt - stent x2   Coronary artery disease    a. prior LAD stenting 2000; b. [facility] 2003 LAD 40, D1 50; c. Lexiscan 02/2017: large defect of severe severity in the basal inferoseptal, basal inferior, mid inferoseptal, mid  inferior and apical inferior location, high risk; d. LHC 03/29/17: LM nl, patent LAD stent w/ 40% ISR, D1 100,  OM1 90, RPDA 100 w/ L-R collats, post atrio 80% s/p PCI/DES     Diabetes mellitus    Hyperlipidemia    Hypertension    Ischemic cardiomyopathy    a. TTE 02/2017: EF 30-35%, diffuse HK, normal LV diastolic function parameters, mild AI/MR, RV systolic function normal, PASP normal   Lung nodule    a. s/p prior thoracotomy   Morbid obesity ([place])    Neuromuscular disorder ([place])    nerves lower back - causes numbness in bil feet & ankles   Wegener's granulomatosis 2007   Past Surgical History:  Procedure Laterality Date   BASAL CELL CARCINOMA EXCISION     removed from face x 3   CARDIAC CATHETERIZATION     CARDIAC SURGERY  05/30/1998   stent in heart   COLONOSCOPY     CORONARY ANGIOPLASTY  09/22/1998   s/p stent placement; [facility] 3.0 x 18 mm Ref [id number]   CORONARY STENT INTERVENTION N/A 03/29/2017   Procedure: CORONARY STENT INTERVENTION;  Surgeon: Ryan Hampshire, MD;  Location: [facility];  Service: Cardiovascular;  Laterality: N/A;   LEFT HEART CATH AND CORONARY ANGIOGRAPHY N/A 03/29/2017   Procedure: LEFT HEART CATH AND CORONARY ANGIOGRAPHY;  Surgeon: Ryan Hampshire, MD;  Location: [place] [facility];  Service: Cardiovascular;  Laterality: N/A;   LUNG SURGERY     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   POLYPECTOMY     ULTRASOUND GUIDANCE FOR VASCULAR ACCESS  03/29/2017   Procedure: Ultrasound Guidance For Vascular Access;  Surgeon: Ryan Hampshire, MD;  Location: [place];  Service: Cardiovascular;;     Current Meds  Medication Sig   acarbose (PRECOSE) 25 MG tablet Take 1 tablet (25 mg total) by mouth 3 (three) times daily with meals.   carvedilol (COREG) 6.25 MG tablet Take 1 tablet (6.25 mg total) by mouth 2 (two) times daily with a meal.   cetirizine (ZYRTEC) 10 MG tablet Take 10 mg by mouth 2 (two) times daily.    cholecalciferol  (VITAMIN D3) 25 MCG (1000 UNIT) tablet Take 3,000 Units by mouth daily.   clobetasol cream (TEMOVATE) 8.33 % Apply 1 application topically 2 (two) times daily. To affected areas   clopidogrel (PLAVIX) 75 MG tablet Take 1 tablet (75 mg total) by mouth daily.   ezetimibe (ZETIA) 10 MG tablet TAKE 1 TABLET BY MOUTH ONCE DAILY . APPOINTMENT REQUIRED FOR FUTURE REFILLS   fexofenadine (ALLEGRA) 180 MG tablet Take 180 mg by mouth daily.   fluticasone (FLONASE) 50 MCG/ACT nasal spray Place 2 sprays into both nostrils daily.   gabapentin (NEURONTIN) 100 MG capsule Take 2 capsules (200 mg total) by mouth at bedtime.   glucose blood (ONETOUCH ULTRA) test strip USE 1 STRIP TO CHECK GLUCOSE IN THE MORNING AND 1 AT BEDTIME   LANCETS ULTRA THIN 30G MISC by Does not apply route. One Touch   metFORMIN (GLUCOPHAGE-XR) 500 MG 24 hr tablet Take 1 tablet (500 mg  total) by mouth daily with breakfast.   Multiple Vitamin (MULTIVITAMIN WITH MINERALS) TABS tablet Take 1 tablet by mouth daily.   Multiple Vitamins-Minerals (AIRBORNE) TBEF Take 1 tablet by mouth daily as needed (for immune system support). Immune Health/Support   nystatin cream (MYCOSTATIN) Apply 1 application topically 2 (two) times daily as needed for dry skin.   predniSONE (DELTASONE) 10 MG tablet Take 3 tabs po for 3 days, then 2 tabs po for 3 days   repaglinide (PRANDIN) 2 MG tablet Take 1 tablet (2 mg total) by mouth 3 (three) times daily before meals.   rosuvastatin (CRESTOR) 5 MG tablet Take 1 tablet (5 mg total) by mouth 3 (three) times a week.   sacubitril-valsartan (ENTRESTO) 49-51 MG Take 1 tablet by mouth twice daily   spironolactone (ALDACTONE) 25 MG tablet Take 1 tablet (25 mg total) by mouth daily.   tetrahydrozoline 0.05 % ophthalmic solution Place 1-2 drops into both eyes 3 (three) times daily as needed (for dry/irritated eyes).     Allergies:   Benicar hct [olmesartan medoxomil-hctz], Bystolic [nebivolol hcl], Dapsone, Lovaza  [omega-3-acid ethyl esters], Niaspan [niacin er], Peanut-containing drug products, and Septra [bactrim]   Social History   Tobacco Use   Smoking status: Former    Packs/day: 0.50    Years: 20.00    Total pack years: 10.00    Types: Cigarettes    Quit date: 05/30/1978    Years since quitting: 43.9   Smokeless tobacco: Never  Vaping Use   Vaping Use: Never used  Substance Use Topics   Alcohol use: No    Alcohol/week: 0.0 standard drinks of alcohol   Drug use: No     Family Hx: The patient's family history includes Cancer in his mother; Stroke in his maternal grandfather. There is no history of Colon cancer, Esophageal cancer, Rectal cancer, or Stomach cancer.  ROS:   Please see the history of present illness.    Review of Systems  Constitutional: Negative.   Respiratory: Negative.    Cardiovascular: Negative.   Gastrointestinal: Negative.   Musculoskeletal:  Positive for back pain.  Neurological: Negative.   Psychiatric/Behavioral: Negative.    All other systems reviewed and are negative.    Labs/Other Tests and Data Reviewed:    Recent Labs: No results found for requested labs within last 365 days.   Recent Lipid Panel Lab Results  Component Value Date/Time   CHOL 98 09/17/2021 12:26 PM   CHOL 185 09/23/2020 09:14 AM   TRIG 161.0 (H) 09/17/2021 12:26 PM   HDL 37.50 (L) 09/17/2021 12:26 PM   HDL 34 (L) 09/23/2020 09:14 AM   CHOLHDL 3 09/17/2021 12:26 PM   LDLCALC 28 09/17/2021 12:26 PM   LDLCALC 104 (H) 09/23/2020 09:14 AM   LDLDIRECT 100.0 05/17/2017 01:49 PM    Wt Readings from Last 3 Encounters:  04/25/22 230 lb 4 oz (104.4 kg)  03/02/22 230 lb 4 oz (104.4 kg)  09/17/21 230 lb 2 oz (104.4 kg)     Exam:    Vital Signs: Vital signs may also be detailed in the HPI BP 120/60 (BP Location: Left Arm, Patient Position: Sitting, Cuff Size: Normal)   Pulse 62   Ht 5' 10.5" (1.791 m)   Wt 230 lb 4 oz (104.4 kg)   SpO2 98%   BMI 32.57 kg/m   Constitutional:   oriented to person, place, and time. No distress.  HENT:  Head: Grossly normal Eyes:  no discharge. No scleral icterus.  Neck:  No JVD, no carotid bruits  Cardiovascular: Regular rate and rhythm, no murmurs appreciated Pulmonary/Chest: Clear to auscultation bilaterally, no wheezes or rails Abdominal: Soft.  no distension.  no tenderness.  Musculoskeletal: Normal range of motion Neurological:  normal muscle tone. Coordination normal. No atrophy Skin: Skin warm and dry Psychiatric: normal affect, pleasant  ASSESSMENT & PLAN:   Coronary artery disease of native artery of native heart with stable angina pectoris (HCC) Currently with no symptoms of angina. No further workup at this time. Continue current medication regimen. Cholesterol at goal  Granulomatosis with polyangiitis, unspecified whether renal involvement ([place]) Stable symptoms  Morbid obesity ([place]) We have encouraged continued exercise, careful diet management in an effort to lose weight. Slow trend down in the weight  Essential hypertension Blood pressure is well controlled on today's visit. No changes made to the medications.  Shortness of breath Recommend walking for conditioning and weight loss  Ischemic cardiomyopathy Continue Entresto, carvedilol, spironolactone [place] and Jardiance with endocrinology, too expensive Requesting updated echo, order placed  Near syncope No further episodes near syncope or vagal episodes   Total encounter time more than 30 minutes  Greater than 50% was spent in counseling and coordination of care with the patient  Signed, Ida Rogue, MD  04/25/2022 11:04 AM    [place] Office [address], [place], [place] [address]

## 2022-04-25 ENCOUNTER — Encounter: Payer: Self-pay | Admitting: Cardiovascular Disease

## 2022-04-25 ENCOUNTER — Ambulatory Visit: Payer: Medicare HMO | Attending: Cardiovascular Disease | Admitting: Cardiovascular Disease

## 2022-04-25 VITALS — BP 120/60 | HR 62 | Ht 70.5 in | Wt 230.2 lb

## 2022-04-25 DIAGNOSIS — M791 Myalgia, unspecified site: Secondary | ICD-10-CM | POA: Diagnosis not present

## 2022-04-25 DIAGNOSIS — I25118 Atherosclerotic heart disease of native coronary artery with other forms of angina pectoris: Secondary | ICD-10-CM

## 2022-04-25 DIAGNOSIS — I10 Essential (primary) hypertension: Secondary | ICD-10-CM

## 2022-04-25 DIAGNOSIS — E11319 Type 2 diabetes mellitus with unspecified diabetic retinopathy without macular edema: Secondary | ICD-10-CM | POA: Diagnosis not present

## 2022-04-25 DIAGNOSIS — R0602 Shortness of breath: Secondary | ICD-10-CM

## 2022-04-25 DIAGNOSIS — E7800 Pure hypercholesterolemia, unspecified: Secondary | ICD-10-CM | POA: Diagnosis not present

## 2022-04-25 DIAGNOSIS — I255 Ischemic cardiomyopathy: Secondary | ICD-10-CM

## 2022-04-25 DIAGNOSIS — T466X5D Adverse effect of antihyperlipidemic and antiarteriosclerotic drugs, subsequent encounter: Secondary | ICD-10-CM

## 2022-04-25 NOTE — Patient Instructions (Addendum)
Medication Instructions:  No changes  If you need a refill on your cardiac medications before your next appointment, please call your pharmacy.    Lab work: No new labs needed   Testing/Procedures: 1) Echocardiogram (for cardiomyopathy) - Your physician has requested that you have an echocardiogram. Echocardiography is a painless test that uses sound waves to create images of your heart. It provides your doctor with information about the size and shape of your heart and how well your heart's chambers and valves are working. This procedure takes approximately one hour. There are no restrictions for this procedure. There is a possibility that an IV may need to be started during your test to inject an image enhancing agent. This is done to obtain more optimal pictures of your heart. Therefore we ask that you do at least drink some water prior to coming in to hydrate your veins.   Please do NOT wear cologne, perfume, aftershave, or lotions (deodorant is allowed). Please arrive 15 minutes prior to your appointment time.    Follow-Up: At [facility], you and your health needs are our priority.  As part of our continuing mission to provide you with exceptional heart care, we have created designated Provider Care Teams.  These Care Teams include your primary Cardiologist (physician) and Advanced Practice Providers (APPs -  Physician Assistants and Nurse Practitioners) who all work together to provide you with the care you need, when you need it.  You will need a follow up appointment in 12 months  Providers on your designated Care Team:   Murray Hodgkins, NP Christell Faith, PA-C Cadence Kathlen Mody, [place]  COVID-19 Vaccine Information can be found at: ShippingScam.co.uk For questions related to vaccine distribution or appointments, please email [email or web address] .[email or web address] or call [phone].    Echocardiogram An echocardiogram is a test that  uses sound waves (ultrasound) to produce images of the heart. Images from an echocardiogram can provide important information about: Heart size and shape. The size and thickness and movement of your heart's walls. Heart muscle function and strength. Heart valve function or if you have stenosis. Stenosis is when the heart valves are too narrow. If blood is flowing backward through the heart valves (regurgitation). A tumor or infectious growth around the heart valves. Areas of heart muscle that are not working well because of poor blood flow or injury from a heart attack. Aneurysm detection. An aneurysm is a weak or damaged part of an artery wall. The wall bulges out from the normal force of blood pumping through the body. Tell a health care provider about: Any allergies you have. All medicines you are taking, including vitamins, herbs, eye drops, creams, and over-the-counter medicines. Any blood disorders you have. Any surgeries you have had. Any medical conditions you have. Whether you are pregnant or may be pregnant. What are the risks? Generally, this is a safe test. However, problems may occur, including an allergic reaction to dye (contrast) that may be used during the test. What happens before the test? No specific preparation is needed. You may eat and drink normally. What happens during the test?  You will take off your clothes from the waist up and put on a hospital gown. Electrodes or electrocardiogram (ECG)patches may be placed on your chest. The electrodes or patches are then connected to a device that monitors your heart rate and rhythm. You will lie down on a table for an ultrasound exam. A gel will be applied to your chest to help sound waves pass through your skin.  A handheld device, called a transducer, will be pressed against your chest and moved over your heart. The transducer produces sound waves that travel to your heart and bounce back (or "echo" back) to the  transducer. These sound waves will be captured in real-time and changed into images of your heart that can be viewed on a video monitor. The images will be recorded on a computer and reviewed by your health care provider. You may be asked to change positions or hold your breath for a short time. This makes it easier to get different views or better views of your heart. In some cases, you may receive contrast through an IV in one of your veins. This can improve the quality of the pictures from your heart. The procedure may vary among health care providers and hospitals. What can I expect after the test? You may return to your normal, everyday life, including diet, activities, and medicines, unless your health care provider tells you not to do that. Follow these instructions at home: It is up to you to get the results of your test. Ask your health care provider, or the department that is doing the test, when your results will be ready. Keep all follow-up visits. This is important. Summary An echocardiogram is a test that uses sound waves (ultrasound) to produce images of the heart. Images from an echocardiogram can provide important information about the size and shape of your heart, heart muscle function, heart valve function, and other possible heart problems. You do not need to do anything to prepare before this test. You may eat and drink normally. After the echocardiogram is completed, you may return to your normal, everyday life, unless your health care provider tells you not to do that. This information is not intended to replace advice given to you by your health care provider. Make sure you discuss any questions you have with your health care provider. Document Revised: 01/27/2021 Document Reviewed: 01/07/2020 Elsevier Patient Education  [place].

## 2022-05-03 ENCOUNTER — Encounter: Payer: Self-pay | Admitting: Family Medicine

## 2022-05-04 ENCOUNTER — Other Ambulatory Visit: Payer: Self-pay | Admitting: Cardiovascular Disease

## 2022-05-10 ENCOUNTER — Other Ambulatory Visit: Payer: Self-pay | Admitting: Cardiovascular Disease

## 2022-05-19 ENCOUNTER — Other Ambulatory Visit: Payer: Self-pay

## 2022-05-19 DIAGNOSIS — E11311 Type 2 diabetes mellitus with unspecified diabetic retinopathy with macular edema: Secondary | ICD-10-CM

## 2022-05-19 MED ORDER — REPAGLINIDE 2 MG PO TABS: 2.0000 mg | ORAL_TABLET | Freq: Three times a day (TID) | ORAL | 3 refills | Status: DC

## 2022-05-25 ENCOUNTER — Other Ambulatory Visit: Payer: Self-pay | Admitting: Cardiovascular Disease

## 2022-05-25 ENCOUNTER — Other Ambulatory Visit: Payer: Self-pay

## 2022-05-25 DIAGNOSIS — E11311 Type 2 diabetes mellitus with unspecified diabetic retinopathy with macular edema: Secondary | ICD-10-CM

## 2022-05-25 MED ORDER — REPAGLINIDE 2 MG PO TABS: 2.0000 mg | ORAL_TABLET | Freq: Three times a day (TID) | ORAL | 0 refills | Status: DC

## 2022-05-27 ENCOUNTER — Ambulatory Visit (INDEPENDENT_AMBULATORY_CARE_PROVIDER_SITE_OTHER): Payer: Medicare HMO

## 2022-05-27 VITALS — Ht 70.5 in | Wt 230.0 lb

## 2022-05-27 DIAGNOSIS — Z0000 Encounter for general adult medical examination without abnormal findings: Secondary | ICD-10-CM | POA: Diagnosis not present

## 2022-05-27 NOTE — Patient Instructions (Addendum)
Ryan Morgan , Thank you for taking time to come for your Medicare Wellness Visit. I appreciate your ongoing commitment to your health goals. Please review the following plan we discussed and let me know if I can assist you in the future.   These are the goals we discussed:  Goals Addressed             This Visit's Progress    Patient Stated   On track    Would like to walk more         This is a list of the screening recommended for you and due dates:  Health Maintenance  Topic Date Due   Yearly kidney health urinalysis for diabetes  11/26/2019   Yearly kidney function blood test for diabetes  12/02/2020   Complete foot exam   11/25/2021   Hemoglobin A1C  12/26/2021   COVID-19 Vaccine (4 - 2023-24 season) 06/12/2022*   Zoster (Shingles) Vaccine (1 of 2) 08/26/2022*   Flu Shot  08/28/2022*   Eye exam for diabetics  04/12/2023   Medicare Annual Wellness Visit  05/28/2023   DTaP/Tdap/Td vaccine (2 - Tdap) 11/30/2027   Pneumonia Vaccine  Completed   HPV Vaccine  Aged Out   Hepatitis C Screening: USPSTF Recommendation to screen - Ages 53-79 yo.  Discontinued  *Topic was postponed. The date shown is not the original due date.   Conditions/risks identified: none new  Next appointment: Follow up in one year for your annual wellness visit.   Preventive Care 55 Years and Older, Male  Preventive care refers to lifestyle choices and visits with your health care provider that can promote health and wellness. What does preventive care include? A yearly physical exam. This is also called an annual well check. Dental exams once or twice a year. Routine eye exams. Ask your health care provider how often you should have your eyes checked. Personal lifestyle choices, including: Daily care of your teeth and gums. Regular physical activity. Eating a healthy diet. Avoiding tobacco and drug use. Limiting alcohol use. Practicing safe sex. Taking low doses of aspirin every day. Taking  vitamin and mineral supplements as recommended by your health care provider. What happens during an annual well check? The services and screenings done by your health care provider during your annual well check will depend on your age, overall health, lifestyle risk factors, and family history of disease. Counseling  Your health care provider may ask you questions about your: Alcohol use. Tobacco use. Drug use. Emotional well-being. Home and relationship well-being. Sexual activity. Eating habits. History of falls. Memory and ability to understand (cognition). Work and work Statistician. Screening  You may have the following tests or measurements: Height, weight, and BMI. Blood pressure. Lipid and cholesterol levels. These may be checked every 5 years, or more frequently if you are over 50 years old. Skin check. Lung cancer screening. You may have this screening every year starting at age 58 if you have a 30-pack-year history of smoking and currently smoke or have quit within the past 15 years. Fecal occult blood test (FOBT) of the stool. You may have this test every year starting at age 57. Flexible sigmoidoscopy or colonoscopy. You may have a sigmoidoscopy every 5 years or a colonoscopy every 10 years starting at age 63. Prostate cancer screening. Recommendations will vary depending on your family history and other risks. Hepatitis C blood test. Hepatitis B blood test. Sexually transmitted disease (STD) testing. Diabetes screening. This is done by checking  your blood sugar (glucose) after you have not eaten for a while (fasting). You may have this done every 1-3 years. Abdominal aortic aneurysm (AAA) screening. You may need this if you are a current or former smoker. Osteoporosis. You may be screened starting at age 37 if you are at high risk. Talk with your health care provider about your test results, treatment options, and if necessary, the need for more tests. Vaccines  Your  health care provider may recommend certain vaccines, such as: Influenza vaccine. This is recommended every year. Tetanus, diphtheria, and acellular pertussis (Tdap, Td) vaccine. You may need a Td booster every 10 years. Zoster vaccine. You may need this after age 56. Pneumococcal 13-valent conjugate (PCV13) vaccine. One dose is recommended after age 10. Pneumococcal polysaccharide (PPSV23) vaccine. One dose is recommended after age 15. Talk to your health care provider about which screenings and vaccines you need and how often you need them. This information is not intended to replace advice given to you by your health care provider. Make sure you discuss any questions you have with your health care provider. Document Released: 06/12/2015 Document Revised: 02/03/2016 Document Reviewed: 03/17/2015 Elsevier Interactive Patient Education  2017 [place] Prevention in the Home Falls can cause injuries. They can happen to people of all ages. There are many things you can do to make your home safe and to help prevent falls. What can I do on the outside of my home? Regularly fix the edges of walkways and driveways and fix any cracks. Remove anything that might make you trip as you walk through a door, such as a raised step or threshold. Trim any bushes or trees on the path to your home. Use bright outdoor lighting. Clear any walking paths of anything that might make someone trip, such as rocks or tools. Regularly check to see if handrails are loose or broken. Make sure that both sides of any steps have handrails. Any raised decks and porches should have guardrails on the edges. Have any leaves, snow, or ice cleared regularly. Use sand or salt on walking paths during winter. Clean up any spills in your garage right away. This includes oil or grease spills. What can I do in the bathroom? Use night lights. Install grab bars by the toilet and in the tub and shower. Do not use towel bars as  grab bars. Use non-skid mats or decals in the tub or shower. If you need to sit down in the shower, use a plastic, non-slip stool. Keep the floor dry. Clean up any water that spills on the floor as soon as it happens. Remove soap buildup in the tub or shower regularly. Attach bath mats securely with double-sided non-slip rug tape. Do not have throw rugs and other things on the floor that can make you trip. What can I do in the bedroom? Use night lights. Make sure that you have a light by your bed that is easy to reach. Do not use any sheets or blankets that are too big for your bed. They should not hang down onto the floor. Have a firm chair that has side arms. You can use this for support while you get dressed. Do not have throw rugs and other things on the floor that can make you trip. What can I do in the kitchen? Clean up any spills right away. Avoid walking on wet floors. Keep items that you use a lot in easy-to-reach places. If you need to reach something  above you, use a strong step stool that has a grab bar. Keep electrical cords out of the way. Do not use floor polish or wax that makes floors slippery. If you must use wax, use non-skid floor wax. Do not have throw rugs and other things on the floor that can make you trip. What can I do with my stairs? Do not leave any items on the stairs. Make sure that there are handrails on both sides of the stairs and use them. Fix handrails that are broken or loose. Make sure that handrails are as long as the stairways. Check any carpeting to make sure that it is firmly attached to the stairs. Fix any carpet that is loose or worn. Avoid having throw rugs at the top or bottom of the stairs. If you do have throw rugs, attach them to the floor with carpet tape. Make sure that you have a light switch at the top of the stairs and the bottom of the stairs. If you do not have them, ask someone to add them for you. What else can I do to help prevent  falls? Wear shoes that: Do not have high heels. Have rubber bottoms. Are comfortable and fit you well. Are closed at the toe. Do not wear sandals. If you use a stepladder: Make sure that it is fully opened. Do not climb a closed stepladder. Make sure that both sides of the stepladder are locked into place. Ask someone to hold it for you, if possible. Clearly mark and make sure that you can see: Any grab bars or handrails. First and last steps. Where the edge of each step is. Use tools that help you move around (mobility aids) if they are needed. These include: Canes. Walkers. Scooters. Crutches. Turn on the lights when you go into a dark area. Replace any light bulbs as soon as they burn out. Set up your furniture so you have a clear path. Avoid moving your furniture around. If any of your floors are uneven, fix them. If there are any pets around you, be aware of where they are. Review your medicines with your doctor. Some medicines can make you feel dizzy. This can increase your chance of falling. Ask your doctor what other things that you can do to help prevent falls. This information is not intended to replace advice given to you by your health care provider. Make sure you discuss any questions you have with your health care provider. Document Released: 03/12/2009 Document Revised: 10/22/2015 Document Reviewed: 06/20/2014 Elsevier Interactive Patient Education  2017 [employer].

## 2022-05-27 NOTE — Progress Notes (Signed)
Subjective:   Ryan Morgan is a 81 y.o. male who presents for Medicare Annual/Subsequent preventive examination.  Review of Systems    No ROS.  Medicare Wellness Virtual Visit.  Visual/audio telehealth visit, UTA vital signs.   See social history for additional risk factors.   Cardiac Risk Factors include: advanced age (>58mn, >>61women);male gender;diabetes mellitus;hypertension     Objective:    Today's Vitals   05/27/22 1436  Weight: 230 lb (104.3 kg)  Height: 5' 10.5" (1.791 m)   Body mass index is 32.54 kg/m.     05/27/2022    2:35 PM 05/26/2021    1:35 PM 12/03/2019    1:15 PM 09/12/2019    9:30 AM 07/26/2019    1:34 PM 11/23/2018   11:48 AM 08/24/2018    7:55 PM  Advanced Directives  Does Patient Have a Medical Advance Directive? _0  No No  Does patient want to make changes to medical advance directive?  Yes (MAU/Ambulatory/Procedural Areas - Information given)     No - Patient declined  Would patient like information on creating a medical advance directive? No - Patient declined  No - Patient declined No - Patient declined No - Patient declined No - Patient declined No - Patient declined    Current Medications (verified) Outpatient Encounter Medications as of 05/27/2022  Medication Sig   acarbose (PRECOSE) 25 MG tablet Take 1 tablet (25 mg total) by mouth 3 (three) times daily with meals.   carvedilol (COREG) 6.25 MG tablet TAKE 1 TABLET BY MOUTH TWICE DAILY WITH A MEAL   cetirizine (ZYRTEC) 10 MG tablet Take 10 mg by mouth 2 (two) times daily.    cholecalciferol (VITAMIN D3) 25 MCG (1000 UNIT) tablet Take 3,000 Units by mouth daily.   clobetasol cream (TEMOVATE) 04.48% Apply 1 application topically 2 (two) times daily. To affected areas   clopidogrel (PLAVIX) 75 MG tablet Take 1 tablet by mouth once daily   ezetimibe (ZETIA) 10 MG tablet Take 1 tablet by mouth once daily   fexofenadine (ALLEGRA) 180 MG tablet Take 180 mg by mouth daily.   fluticasone  (FLONASE) 50 MCG/ACT nasal spray Place 2 sprays into both nostrils daily.   gabapentin (NEURONTIN) 100 MG capsule Take 2 capsules (200 mg total) by mouth at bedtime.   glucose blood (ONETOUCH ULTRA) test strip USE 1 STRIP TO CHECK GLUCOSE IN THE MORNING AND 1 AT BEDTIME   LANCETS ULTRA THIN 30G MISC by Does not apply route. One Touch   metFORMIN (GLUCOPHAGE-XR) 500 MG 24 hr tablet Take 1 tablet (500 mg total) by mouth daily with breakfast.   Multiple Vitamin (MULTIVITAMIN WITH MINERALS) TABS tablet Take 1 tablet by mouth daily.   Multiple Vitamins-Minerals (AIRBORNE) TBEF Take 1 tablet by mouth daily as needed (for immune system support). Immune Health/Support   nystatin cream (MYCOSTATIN) Apply 1 application topically 2 (two) times daily as needed for dry skin.   predniSONE (DELTASONE) 10 MG tablet Take 3 tabs po for 3 days, then 2 tabs po for 3 days   repaglinide (PRANDIN) 2 MG tablet Take 1 tablet (2 mg total) by mouth 3 (three) times daily before meals.   rosuvastatin (CRESTOR) 5 MG tablet Take 1 tablet (5 mg total) by mouth 3 (three) times a week.   sacubitril-valsartan (ENTRESTO) 49-51 MG TAKE 1 TABLET BY MOUTH TWICE DAILY (ADDITIONAL REFILL AVAILABLE AT YEARLY VISIT)   spironolactone (ALDACTONE) 25 MG tablet Take 1 tablet (25 mg  total) by mouth daily.   tetrahydrozoline 0.05 % ophthalmic solution Place 1-2 drops into both eyes 3 (three) times daily as needed (for dry/irritated eyes).   No facility-administered encounter medications on file as of 05/27/2022.    Allergies (verified) Benicar hct [olmesartan medoxomil-hctz], Bystolic [nebivolol hcl], Dapsone, Lovaza [omega-3-acid ethyl esters], Niaspan [niacin er], Peanut-containing drug products, and Septra [bactrim]   History: Past Medical History:  Diagnosis Date   Cancer ([place])    Basal cell ca of nose   Clotting disorder ([place])    to prevent CVS per pt - stent x2   Coronary artery disease    a. prior LAD stenting 2000; b. Springer 2003  LAD 40, D1 50; c. Lexiscan 02/2017: large defect of severe severity in the basal inferoseptal, basal inferior, mid inferoseptal, mid inferior and apical inferior location, high risk; d. LHC 03/29/17: LM nl, patent LAD stent w/ 40% ISR, D1 100,  OM1 90, RPDA 100 w/ L-R collats, post atrio 80% s/p PCI/DES     Diabetes mellitus    Hyperlipidemia    Hypertension    Ischemic cardiomyopathy    a. TTE 02/2017: EF 30-35%, diffuse HK, normal LV diastolic function parameters, mild AI/MR, RV systolic function normal, PASP normal   Lung nodule    a. s/p prior thoracotomy   Morbid obesity ([place])    Neuromuscular disorder ([place])    nerves lower back - causes numbness in bil feet & ankles   Wegener's granulomatosis 2007   Past Surgical History:  Procedure Laterality Date   BASAL CELL CARCINOMA EXCISION     removed from face x 3   CARDIAC CATHETERIZATION     CARDIAC SURGERY  05/30/1998   stent in heart   COLONOSCOPY     CORONARY ANGIOPLASTY  09/22/1998   s/p stent placement; [facility] 3.0 x 18 mm Ref [id number]   CORONARY STENT INTERVENTION N/A 03/29/2017   Procedure: CORONARY STENT INTERVENTION;  Surgeon: Wellington Hampshire, MD;  Location: [facility];  Service: Cardiovascular;  Laterality: N/A;   LEFT HEART CATH AND CORONARY ANGIOGRAPHY N/A 03/29/2017   Procedure: LEFT HEART CATH AND CORONARY ANGIOGRAPHY;  Surgeon: Wellington Hampshire, MD;  Location: [facility];  Service: Cardiovascular;  Laterality: N/A;   LUNG SURGERY     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   POLYPECTOMY     ULTRASOUND GUIDANCE FOR VASCULAR ACCESS  03/29/2017   Procedure: Ultrasound Guidance For Vascular Access;  Surgeon: Wellington Hampshire, MD;  Location: [facility];  Service: Cardiovascular;;   Family History  Problem Relation Age of Onset   Cancer Mother        tumor on tonsil   Stroke Maternal Grandfather    Colon cancer Neg Hx    Esophageal cancer Neg Hx    Rectal cancer Neg Hx    Stomach cancer  Neg Hx    Social History   Socioeconomic History   Marital status: Married    Spouse name: Not on file   Number of children: Not on file   Years of education: Not on file   Highest education level: Not on file  Occupational History   Not on file  Tobacco Use   Smoking status: Former    Packs/day: 0.50    Years: 20.00    Total pack years: 10.00    Types: Cigarettes    Quit date: 05/30/1978    Years since quitting: 44.0   Smokeless tobacco: Never  Vaping Use  Vaping Use: Never used  Substance and Sexual Activity   Alcohol use: No    Alcohol/week: 0.0 standard drinks of alcohol   Drug use: No   Sexual activity: Not Currently  Other Topics Concern   Not on file  Social History Narrative   Regular exercise: yes   Caffeine use: very little   Social Determinants of Health   Financial Resource Strain: Low Risk  (05/27/2022)   Overall Financial Resource Strain (CARDIA)    Difficulty of Paying Living Expenses: Not hard at all  Food Insecurity: No Food Insecurity (05/27/2022)   Hunger Vital Sign    Worried About Running Out of Food in the Last Year: Never true    Ran Out of Food in the Last Year: Never true  Transportation Needs: No Transportation Needs (05/27/2022)   PRAPARE - Hydrologist (Medical): No    Lack of Transportation (Non-Medical): No  Physical Activity: Inactive (05/26/2021)   Exercise Vital Sign    Days of Exercise per Week: 0 days    Minutes of Exercise per Session: 0 min  Stress: No Stress Concern Present (05/27/2022)   [place]    Feeling of Stress : Not at all  Social Connections: Moderately Isolated (05/27/2022)   Social Connection and Isolation Panel [NHANES]    Frequency of Communication with Friends and Family: More than three times a week    Frequency of Social Gatherings with Friends and Family: Three times a week    Attends Religious Services: Never     Active Member of Clubs or Organizations: No    Attends Archivist Meetings: Never    Marital Status: Married    Tobacco Counseling Counseling given: Not Answered   Clinical Intake:  Pre-visit preparation completed: Yes        Diabetes: Yes (Followed by Endocrinology)  How often do you need to have someone help you when you read instructions, pamphlets, or other written materials from your doctor or pharmacy?: 1 - Never  Nutrition Risk Assessment: Has the patient had any N/V/D within the last 2 months?  No  Does the patient have any non-healing wounds?  No  Has the patient had any unintentional weight loss or weight gain?  No   Diabetes: Is the patient diabetic?  Yes  If diabetic, was a CBG obtained today?  Yes , FB 140 Did the patient bring in their glucometer from home?  No  How often do you monitor your CBG's? 2-3 daily.   Financial Strains and Diabetes Management: Are you having any financial strains with the device, your supplies or your medication? No .  Does the patient want to be seen by Chronic Care Management for management of their diabetes?  No  Would the patient like to be referred to a Nutritionist or for Diabetic Management?  No   Interpreter Needed?: No      Activities of Daily Living    05/27/2022    2:43 PM  In your present state of health, do you have any difficulty performing the following activities:  Hearing? 0  Vision? 0  Difficulty concentrating or making decisions? 0  Walking or climbing stairs? 0  Dressing or bathing? 0  Doing errands, shopping? 0  Preparing Food and eating ? N  Using the Toilet? N  In the past six months, have you accidently leaked urine? N  Do you have problems with loss of bowel control? N  Managing your Medications? N  Managing your Finances? N  Housekeeping or managing your Housekeeping? N    Patient Care Team: Tower, Wynelle Fanny, MD as PCP - General (Family Medicine) Rockey Situ, Kathlene November, MD as  Consulting Physician (Cardiology) Oneta Rack, MD as Consulting Physician (Dermatology) Juanito Doom, MD as Consulting Physician (Pulmonary Disease) Charlton Haws, [facility] as Pharmacist (Pharmacist)  Indicate any recent Medical Services you may have received from other than [facility] providers in the past year (date may be approximate).     Assessment:   This is a routine wellness examination for Ryan Morgan.  I connected with  [name] on 05/27/22 by a audio enabled telemedicine application and verified that I am speaking with the correct person using two identifiers.  Patient Location: Home  Provider Location: Office/Clinic  I discussed the limitations of evaluation and management by telemedicine. The patient expressed understanding and agreed to proceed.   Hearing/Vision screen Hearing Screening - Comments:: Patient is able to hear conversational tones without difficulty.  No issues reported.   Vision Screening - Comments:: Last exam 03/2022, Dr. Lu Duffel No retinopathy    Dietary issues and exercise activities discussed: Current Exercise Habits: Home exercise routine, Intensity: Mild   Goals Addressed             This Visit's Progress    Patient Stated   On track    Would like to walk more       Depression Screen    05/27/2022    2:41 PM 05/26/2021    1:38 PM 12/03/2019    1:16 PM 11/23/2018    9:40 AM 11/20/2017   11:20 AM 11/15/2016   12:47 PM 10/09/2015   10:40 AM  PHQ 2/9 Scores  PHQ - 2 Score 0 0 0 0 0 0 0  PHQ- 9 Score   0 0 0      Fall Risk    05/27/2022    2:43 PM 05/26/2021    1:37 PM 12/03/2019    1:15 PM 11/23/2018    9:40 AM 11/20/2017   11:20 AM  [place] in the past year? 0 0 1 0 No  Comment   tripped and fell outside    Number falls in past yr: 0 0 0    Injury with Fall? 0 0 1    Comment   head injury    Risk for fall due to : No Fall Risks No Fall Risks Medication side effect    Follow up Falls evaluation  completed;Falls prevention discussed Falls prevention discussed Falls evaluation completed;Falls prevention discussed      FALL RISK PREVENTION PERTAINING TO THE HOME: Home free of loose throw rugs in walkways, pet beds, electrical cords, etc? Yes  Adequate lighting in your home to reduce risk of falls? Yes   ASSISTIVE DEVICES UTILIZED TO PREVENT FALLS: Life alert? No  Use of a cane, walker or w/c? No  Grab bars in the bathroom? No  Shower chair or bench in shower? Yes  Comfort height toilet? Yes   TIMED UP AND GO: Was the test performed? No .   Cognitive Function:    12/03/2019    1:18 PM 11/23/2018    9:41 AM 11/20/2017   11:21 AM 11/15/2016   12:47 PM 10/09/2015   11:00 AM  MMSE - Mini Mental State Exam  Orientation to time _0 Orientation to Place _1 Registration 3  _0 Attention/ Calculation 5 0 0 0 0  Recall _1 Recall-comments   unable to recall 1 of 3 words    Language- name 2 objects  0 0 0 0  Language- repeat _2 Language- follow 3 step command  0 _3 Language- read & follow direction  0 0 0 0  Write a sentence  0 0 0 0  Copy design  0 0 0 0  Total score  _4 05/27/2022    2:45 PM  6CIT Screen  What Year? 0 points  What month? 0 points  What time? 0 points  Months in reverse 0 points    Immunizations Immunization History  Administered Date(s) Administered   Fluad Quad(high Dose 65+) 03/13/2019, 03/15/2021   Influenza Split 05/16/2011, 02/16/2012   Influenza, High Dose Seasonal PF 02/28/2017   Influenza,inj,Quad PF,6+ Mos 03/05/2013, 03/10/2014, 05/20/2015, 03/30/2016, 05/10/2018   PFIZER(Purple Top)SARS-COV-2 Vaccination 07/12/2019, 08/02/2019, 01/17/2020   Pneumococcal Conjugate-13 06/09/2014   Pneumococcal Polysaccharide-23 07/15/2011   Td 11/29/2017   Shingrix vaccine- declined.   Screening Tests Health Maintenance  Topic Date Due   Diabetic kidney evaluation - Urine ACR  11/26/2019    Diabetic kidney evaluation - eGFR measurement  12/02/2020   FOOT EXAM  11/25/2021   HEMOGLOBIN A1C  12/26/2021   COVID-19 Vaccine (4 - 2023-24 season) 06/12/2022 (Originally 01/28/2022)   Zoster Vaccines- Shingrix (1 of 2) 08/26/2022 (Originally 08/01/1959)   INFLUENZA VACCINE  08/28/2022 (Originally 12/28/2021)   OPHTHALMOLOGY EXAM  04/12/2023   Medicare Annual Wellness (AWV)  05/28/2023   DTaP/Tdap/Td (2 - Tdap) 11/30/2027   Pneumonia Vaccine 22+ Years old  Completed   HPV VACCINES  Aged Out   Hepatitis C Screening  Discontinued   Health Maintenance Health Maintenance Due  Topic Date Due   Diabetic kidney evaluation - Urine ACR  11/26/2019   Diabetic kidney evaluation - eGFR measurement  12/02/2020   FOOT EXAM  11/25/2021   HEMOGLOBIN A1C  12/26/2021   Lung Cancer Screening: (Low Dose CT Chest recommended if Age 70-80 years, 30 pack-year currently smoking OR have quit w/in 15years.) does not qualify.   Hepatitis C Screening: does not qualify.  Vision Screening: Recommended annual ophthalmology exams for early detection of glaucoma and other disorders of the eye. Dental Screening: Recommended annual dental exams for proper oral hygiene  Community Resource Referral / Chronic Care Management: CRR required this visit?  No   CCM required this visit?  No      Plan:     I have personally reviewed and noted the following in the patient's chart:   Medical and social history Use of alcohol, tobacco or illicit drugs  Current medications and supplements including opioid prescriptions. Patient is not currently taking opioid prescriptions. Functional ability and status Nutritional status Physical activity Advanced directives List of other physicians Hospitalizations, surgeries, and ER visits in previous 12 months Vitals Screenings to include cognitive, depression, and falls Referrals and appointments  In addition, I have reviewed and discussed with patient certain preventive  protocols, quality metrics, and best practice recommendations. A written personalized care plan for preventive services as well as general preventive health recommendations were provided to patient.     Leta Jungling, LPN   [id number]

## 2022-05-30 ENCOUNTER — Encounter: Payer: Self-pay | Admitting: Family Medicine

## 2022-05-30 DIAGNOSIS — E11311 Type 2 diabetes mellitus with unspecified diabetic retinopathy with macular edema: Secondary | ICD-10-CM

## 2022-05-31 MED ORDER — REPAGLINIDE 2 MG PO TABS: 2.0000 mg | ORAL_TABLET | Freq: Three times a day (TID) | ORAL | 1 refills | Status: DC

## 2022-06-07 NOTE — Progress Notes (Unsigned)
    Ryan Tandy T. Ryan Lile, MD, [name] [name], [address]  Phone: [phone]  FAX: [name] - 82 y.o. male  MRN [id number]  Date of Birth: 30-Sep-1940  Date: 06/08/2022  PCP: Abner Greenspan, MD  Referral: Abner Greenspan, MD  No chief complaint on file.  Subjective:   Ryan Morgan is a 82 y.o. very pleasant male patient with There is no height or weight on file to calculate BMI. who presents with the following:  Pleasant patient presents with ongoing severe knee pain.    Review of Systems is noted in the HPI, as appropriate  Objective:   There were no vitals taken for this visit.  GEN: No acute distress; alert,appropriate. PULM: Breathing comfortably in no respiratory distress PSYCH: Normally interactive.   Laboratory and Imaging Data:  Assessment and Plan:   ***

## 2022-06-08 ENCOUNTER — Encounter: Payer: Self-pay | Admitting: Family Medicine

## 2022-06-08 ENCOUNTER — Ambulatory Visit (INDEPENDENT_AMBULATORY_CARE_PROVIDER_SITE_OTHER): Payer: Medicare HMO | Admitting: Family Medicine

## 2022-06-08 ENCOUNTER — Ambulatory Visit (INDEPENDENT_AMBULATORY_CARE_PROVIDER_SITE_OTHER)
Admission: RE | Admit: 2022-06-08 | Discharge: 2022-06-08 | Disposition: A | Discharge status: Home / Self Care | Payer: Medicare HMO | Source: Ambulatory Visit | Attending: Family Medicine | Admitting: Family Medicine

## 2022-06-08 VITALS — BP 110/60 | HR 59 | Temp 97.9°F | Ht 70.5 in | Wt 227.4 lb

## 2022-06-08 DIAGNOSIS — M25561 Pain in right knee: Secondary | ICD-10-CM | POA: Diagnosis not present

## 2022-06-08 DIAGNOSIS — G8929 Other chronic pain: Secondary | ICD-10-CM | POA: Diagnosis not present

## 2022-06-08 DIAGNOSIS — M1711 Unilateral primary osteoarthritis, right knee: Secondary | ICD-10-CM | POA: Diagnosis not present

## 2022-06-08 MED ORDER — TRIAMCINOLONE ACETONIDE 40 MG/ML IJ SUSP
40.0000 mg | Freq: Once | INTRAMUSCULAR | Status: AC
  Administered 2022-06-08: 40 mg via INTRA_ARTICULAR

## 2022-06-10 ENCOUNTER — Ambulatory Visit: Payer: Medicare HMO | Attending: Cardiovascular Disease

## 2022-06-10 DIAGNOSIS — I255 Ischemic cardiomyopathy: Secondary | ICD-10-CM | POA: Diagnosis not present

## 2022-06-10 MED ORDER — PERFLUTREN LIPID MICROSPHERE
1.0000 mL | INTRAVENOUS | Status: AC | PRN
  Administered 2022-06-10: 2 mL via INTRAVENOUS

## 2022-06-11 LAB — ECHOCARDIOGRAM COMPLETE
AR max vel: 2.59 cm2
AV Area VTI: 2.72 cm2
AV Area mean vel: 2.52 cm2
AV Mean grad: 3 mmHg
AV Peak grad: 4.8 mmHg
Ao pk vel: 1.1 m/s
Area-P 1/2: 2.29 cm2
S' Lateral: 4.2 cm

## 2022-06-16 ENCOUNTER — Telehealth: Payer: Self-pay | Admitting: Cardiovascular Disease

## 2022-06-16 DIAGNOSIS — D2262 Melanocytic nevi of left upper limb, including shoulder: Secondary | ICD-10-CM | POA: Diagnosis not present

## 2022-06-16 DIAGNOSIS — L570 Actinic keratosis: Secondary | ICD-10-CM | POA: Diagnosis not present

## 2022-06-16 DIAGNOSIS — L821 Other seborrheic keratosis: Secondary | ICD-10-CM | POA: Diagnosis not present

## 2022-06-16 DIAGNOSIS — D2261 Melanocytic nevi of right upper limb, including shoulder: Secondary | ICD-10-CM | POA: Diagnosis not present

## 2022-06-16 DIAGNOSIS — D2272 Melanocytic nevi of left lower limb, including hip: Secondary | ICD-10-CM | POA: Diagnosis not present

## 2022-06-16 DIAGNOSIS — D225 Melanocytic nevi of trunk: Secondary | ICD-10-CM | POA: Diagnosis not present

## 2022-06-16 DIAGNOSIS — D2271 Melanocytic nevi of right lower limb, including hip: Secondary | ICD-10-CM | POA: Diagnosis not present

## 2022-06-16 DIAGNOSIS — X32XXXA Exposure to sunlight, initial encounter: Secondary | ICD-10-CM | POA: Diagnosis not present

## 2022-06-16 NOTE — Telephone Encounter (Signed)
Pt made aware of ECHO results and verbalized understanding.

## 2022-06-16 NOTE — Telephone Encounter (Signed)
Ryan Merritts, MD 06/11/2022  4:30 PM EST     Echocardiogram Ejection fraction remains mildly depressed 35 to 40%, previously seen at same level in 2021 Right ventricle normal size and function No significant valvular heart disease Overall looks relatively unchanged

## 2022-06-16 NOTE — Telephone Encounter (Signed)
Pt returning call

## 2022-06-16 NOTE — Telephone Encounter (Signed)
Ricci Barker, RN 06/13/2022  7:39 AM EST     Results released to MyChart with comments

## 2022-06-16 NOTE — Telephone Encounter (Signed)
Results have not been reviewed on MyChart. Attempted to call the patient: - Cell #- no answer & voice mail is full - Home #[facility]

## 2022-07-05 ENCOUNTER — Encounter: Payer: Self-pay | Admitting: Internal Medicine

## 2022-07-05 ENCOUNTER — Ambulatory Visit: Payer: Medicare HMO | Admitting: Internal Medicine

## 2022-07-05 VITALS — BP 130/60 | HR 65 | Ht 70.0 in | Wt 228.6 lb

## 2022-07-05 DIAGNOSIS — E1142 Type 2 diabetes mellitus with diabetic polyneuropathy: Secondary | ICD-10-CM | POA: Diagnosis not present

## 2022-07-05 DIAGNOSIS — E1159 Type 2 diabetes mellitus with other circulatory complications: Secondary | ICD-10-CM | POA: Diagnosis not present

## 2022-07-05 DIAGNOSIS — E119 Type 2 diabetes mellitus without complications: Secondary | ICD-10-CM | POA: Insufficient documentation

## 2022-07-05 LAB — POCT GLYCOSYLATED HEMOGLOBIN (HGB A1C): Hemoglobin A1C: 7.3 % — AB (ref 4.0–5.6)

## 2022-07-05 MED ORDER — ACARBOSE 25 MG PO TABS: 25.0000 mg | ORAL_TABLET | Freq: Three times a day (TID) | ORAL | 3 refills | Status: DC

## 2022-07-05 MED ORDER — REPAGLINIDE 2 MG PO TABS: 2.0000 mg | ORAL_TABLET | Freq: Three times a day (TID) | ORAL | 3 refills | Status: DC

## 2022-07-05 MED ORDER — METFORMIN HCL ER 500 MG PO TB24: 500.0000 mg | ORAL_TABLET | Freq: Every day | ORAL | 3 refills | Status: DC

## 2022-07-05 NOTE — Patient Instructions (Signed)
Continue Metformin 500 mg daily Continue Repaglinide 2 mg 3 times daily before each meal  Continue Acarbose 25 mg 3 times daily before each meal     HOW TO TREAT LOW BLOOD SUGARS (Blood sugar LESS THAN 70 MG/DL) Please follow the RULE OF 15 for the treatment of hypoglycemia treatment (when your (blood sugars are less than 70 mg/dL)   STEP 1: Take 15 grams of carbohydrates when your blood sugar is low, which includes:  3-4 GLUCOSE TABS  OR 3-4 OZ OF JUICE OR REGULAR SODA OR ONE TUBE OF GLUCOSE GEL    STEP 2: RECHECK blood sugar in 15 MINUTES STEP 3: If your blood sugar is still low at the 15 minute recheck --> then, go back to STEP 1 and treat AGAIN with another 15 grams of carbohydrates.

## 2022-07-05 NOTE — Progress Notes (Signed)
Name: Ryan Morgan  Age/ Sex: 82 y.o., male   MRN/ DOB: [id number], 1940-07-08     PCP: Abner Greenspan, MD   Reason for Endocrinology Evaluation: Type 2 Diabetes Mellitus  Initial Endocrine Consultative Visit: 06/15/2017    PATIENT IDENTIFIER: Ryan Morgan is a 82 y.o. male with a past medical history of Dm, HTN and dyslipidemia . The patient has followed with Endocrinology clinic since 06/15/2017 for consultative assistance with management of his diabetes.  DIABETIC HISTORY:  Ryan Morgan was diagnosed with DM 1998. His hemoglobin A1c has ranged from 6.7% in 2016, peaking at 8.4% in 2021.  Patient was followed by Dr. Loanne Drilling from 2019 until 05/2021 SUBJECTIVE:   During the last visit (06/28/2021): saw Dr. Loanne Drilling   Today (07/05/2022): Mr. Kant is here for follow-up on diabetes management.  He is accompanied by his spouse today.  He checks his blood sugars 1 times daily. The patient has not had hypoglycemic episodes since the last clinic visit.  Patient is on intermittent glucocorticoids due to Wegener's granulomatosis Denies nausea, vomiting or diarrhea   HOME DIABETES REGIMEN:  Metformin 500 mg daily Repaglinide 2 mg 3 times daily Acarbose 25 mg 3 times daily   Statin: Yes ACE-I/ARB: Yes    METER DOWNLOAD SUMMARY: Date range evaluated: 1/24-07/05/2022 Fingerstick Blood Glucose Tests = 15 Average Number Tests/Day = 1 Overall Mean FS Glucose = 126 Standard Deviation = 32  BG Ranges: Low = 89 High = 178   Hypoglycemic Events/30 Days: BG < 50 = 0 Episodes of symptomatic severe hypoglycemia = 0    DIABETIC COMPLICATIONS: Microvascular complications:  neuropsthy Denies: CKD Last Eye Exam: Completed 2023  Macrovascular complications:  CAD Denies:  CVA, PVD   HISTORY:  Past Medical History:  Past Medical History:  Diagnosis Date   Cancer ([place])    Basal cell ca of nose   Clotting disorder ([place])    to prevent CVS per pt - stent x2   Coronary artery disease     a. prior LAD stenting 2000; b. [place] 2003 LAD 40, D1 50; c. Lexiscan 02/2017: large defect of severe severity in the basal inferoseptal, basal inferior, mid inferoseptal, mid inferior and apical inferior location, high risk; d. LHC 03/29/17: LM nl, patent LAD stent w/ 40% ISR, D1 100,  OM1 90, RPDA 100 w/ L-R collats, post atrio 80% s/p PCI/DES     Diabetes mellitus    Hyperlipidemia    Hypertension    Ischemic cardiomyopathy    a. TTE 02/2017: EF 30-35%, diffuse HK, normal LV diastolic function parameters, mild AI/MR, RV systolic function normal, PASP normal   Lung nodule    a. s/p prior thoracotomy   Morbid obesity ([place])    Neuromuscular disorder ([place])    nerves lower back - causes numbness in bil feet & ankles   Wegener's granulomatosis 2007   Past Surgical History:  Past Surgical History:  Procedure Laterality Date   BASAL CELL CARCINOMA EXCISION     removed from face x 3   CARDIAC CATHETERIZATION     CARDIAC SURGERY  05/30/1998   stent in heart   COLONOSCOPY     CORONARY ANGIOPLASTY  09/22/1998   s/p stent placement; [employer] 3.0 x 18 mm Ref [id number]   CORONARY STENT INTERVENTION N/A 03/29/2017   Procedure: CORONARY STENT INTERVENTION;  Surgeon: Wellington Hampshire, MD;  Location: [facility];  Service: Cardiovascular;  Laterality: N/A;   LEFT HEART CATH AND CORONARY ANGIOGRAPHY  N/A 03/29/2017   Procedure: LEFT HEART CATH AND CORONARY ANGIOGRAPHY;  Surgeon: Wellington Hampshire, MD;  Location: [facility];  Service: Cardiovascular;  Laterality: N/A;   LUNG SURGERY     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   POLYPECTOMY     ULTRASOUND GUIDANCE FOR VASCULAR ACCESS  03/29/2017   Procedure: Ultrasound Guidance For Vascular Access;  Surgeon: Wellington Hampshire, MD;  Location: [facility];  Service: Cardiovascular;;   Social History:  reports that he quit smoking about 44 years ago. His smoking use included cigarettes. He has a 10.00 pack-year smoking history. He  has never used smokeless tobacco. He reports that he does not drink alcohol and does not use drugs. Family History:  Family History  Problem Relation Age of Onset   Cancer Mother        tumor on tonsil   Stroke Maternal Grandfather    Colon cancer Neg Hx    Esophageal cancer Neg Hx    Rectal cancer Neg Hx    Stomach cancer Neg Hx      HOME MEDICATIONS: Allergies as of 07/05/2022       Reactions   Benicar Hct [olmesartan Medoxomil-hctz] Itching   Painful and itchy knots on palms of hands   Bystolic [nebivolol Hcl] Other (See Comments)   headache   Dapsone Hives   Lovaza [omega-3-acid Ethyl Esters] Other (See Comments)   Unknown reaction   Niaspan [niacin Er] Other (See Comments)   flushing   Peanut-containing Drug Products    Rash on hands from peanuts   Septra [bactrim] Hives        Medication List        Accurate as of July 05, 2022  2:42 PM. If you have any questions, ask your nurse or doctor.          acarbose 25 MG tablet Commonly known as: PRECOSE Take 1 tablet (25 mg total) by mouth 3 (three) times daily with meals.   Airborne Tbef Take 1 tablet by mouth daily as needed (for immune system support). Immune Health/Support   carvedilol 6.25 MG tablet Commonly known as: COREG TAKE 1 TABLET BY MOUTH TWICE DAILY WITH A MEAL   cetirizine 10 MG tablet Commonly known as: ZYRTEC Take 10 mg by mouth 2 (two) times daily.   cholecalciferol 25 MCG (1000 UNIT) tablet Commonly known as: VITAMIN D3 Take 3,000 Units by mouth daily.   clobetasol cream 0.05 % Commonly known as: TEMOVATE Apply 1 application topically 2 (two) times daily. To affected areas   clopidogrel 75 MG tablet Commonly known as: PLAVIX Take 1 tablet by mouth once daily   Entresto 49-51 MG Generic drug: sacubitril-valsartan TAKE 1 TABLET BY MOUTH TWICE DAILY (ADDITIONAL REFILL AVAILABLE AT YEARLY VISIT)   ezetimibe 10 MG tablet Commonly known as: ZETIA Take 1 tablet by mouth once  daily   fexofenadine 180 MG tablet Commonly known as: ALLEGRA Take 180 mg by mouth daily.   fluticasone 50 MCG/ACT nasal spray Commonly known as: FLONASE Place 2 sprays into both nostrils daily.   gabapentin 100 MG capsule Commonly known as: NEURONTIN Take 2 capsules (200 mg total) by mouth at bedtime.   Lancets Ultra Thin 30G Misc by Does not apply route. One Touch   metFORMIN 500 MG 24 hr tablet Commonly known as: GLUCOPHAGE-XR Take 1 tablet (500 mg total) by mouth daily with breakfast.   multivitamin with minerals Tabs tablet Take 1 tablet by mouth daily.  nystatin cream Commonly known as: MYCOSTATIN Apply 1 application topically 2 (two) times daily as needed for dry skin.   OneTouch Ultra test strip Generic drug: glucose blood USE 1 STRIP TO CHECK GLUCOSE IN THE MORNING AND 1 AT BEDTIME   predniSONE 10 MG tablet Commonly known as: DELTASONE Take 3 tabs po for 3 days, then 2 tabs po for 3 days   repaglinide 2 MG tablet Commonly known as: PRANDIN Take 1 tablet (2 mg total) by mouth 3 (three) times daily before meals.   rosuvastatin 5 MG tablet Commonly known as: CRESTOR Take 1 tablet (5 mg total) by mouth 3 (three) times a week.   spironolactone 25 MG tablet Commonly known as: ALDACTONE Take 1 tablet (25 mg total) by mouth daily.   tetrahydrozoline 0.05 % ophthalmic solution Place 1-2 drops into both eyes 3 (three) times daily as needed (for dry/irritated eyes).         OBJECTIVE:   Vital Signs: BP 130/60 (BP Location: Left Arm, Patient Position: Sitting, Cuff Size: Large)   Pulse 65   Ht '5\' 10"'$  (1.778 m)   Wt 228 lb 9.6 oz (103.7 kg)   SpO2 97%   BMI 32.80 kg/m   Wt Readings from Last 3 Encounters:  07/05/22 228 lb 9.6 oz (103.7 kg)  06/08/22 227 lb 6 oz (103.1 kg)  05/27/22 230 lb (104.3 kg)     Exam: General: Pt appears well and is in NAD  Lungs: Clear with good BS bilat   Heart: RRR   Abdomen:  soft, nontender  Extremities: No  pretibial edema.   Neuro: MS is good with appropriate affect, pt is alert and Ox3    DM foot exam: 07/05/2022  The skin of the feet is intact without sores or ulcerations. The pedal pulses are 1+ on right and 1+ on left. The sensation is absent to a screening 5.07, 10 gram monofilament bilaterally     DATA REVIEWED:  Lab Results  Component Value Date   HGBA1C 7.3 (A) 07/05/2022   HGBA1C 7.0 (A) 06/28/2021   HGBA1C 6.5 (A) 11/25/2020   10/26/2021 Protein/CR ratio 0.081 BUN 12 Creatinine 0.86 GFR 88 Calcium 9.6   Old records , labs and images have been reviewed.    ASSESSMENT / PLAN / RECOMMENDATIONS:   1) Type 2 Diabetes Mellitus, Optimally controlled, With Neuropathic and macrovascular  complications - Most recent A1c of 7.3 %. Goal A1c < 7.5 %.    -A1c at goal -No history of intolerance to metformin -Spouse had questions about metformin which I did answer including benefits and side effects and pharmacokinetics -Patient does use glucocorticoids intermittently, he doubles up on repaglinide and acarbose per his previous endocrinologist advised, which he does well with -No changes at this time   MEDICATIONS: Continue metformin 500 mg daily Continue repaglinide 2 mg 3 times daily before every meal Continue acarbose 25 mg 3 times daily before every meal  EDUCATION / INSTRUCTIONS: BG monitoring instructions: Patient is instructed to check his blood sugars 1 times a day. Call [facility] if: BG persistently < 70  I reviewed the Rule of 15 for the treatment of hypoglycemia in detail with the patient. Literature supplied.    2) Diabetic complications:  Eye: Does not have known diabetic retinopathy.  Neuro/ Feet: Does  have known diabetic peripheral neuropathy .  Renal: Patient does not have known baseline CKD. He   is  on an ACEI/ARB at present.  F/U in 6 months    I spent 25 minutes preparing to see the patient by review of recent labs,  imaging and procedures, obtaining and reviewing separately obtained history, communicating with the patient/family or caregiver, ordering medications, and documenting clinical information in the EHR including the differential Dx, treatment, and any further evaluation and other management   Signed electronically by: Mack Guise, MD  [facility]  [address] [place], [place] [address] Phone: [phone] FAX: [phone]   CC: Tower, Wynelle Fanny, MD [place] [address] Phone: [phone]  Fax: [phone]  Return to Endocrinology clinic as below: No future appointments.

## 2022-07-07 ENCOUNTER — Other Ambulatory Visit: Payer: Self-pay | Admitting: Cardiovascular Disease

## 2022-07-21 ENCOUNTER — Other Ambulatory Visit: Payer: Self-pay | Admitting: Cardiovascular Disease

## 2022-08-01 ENCOUNTER — Encounter: Payer: Self-pay | Admitting: Family Medicine

## 2022-08-01 ENCOUNTER — Ambulatory Visit (INDEPENDENT_AMBULATORY_CARE_PROVIDER_SITE_OTHER): Payer: Medicare HMO | Admitting: Family Medicine

## 2022-08-01 VITALS — BP 110/60 | HR 66 | Temp 97.3°F | Ht 70.0 in | Wt 229.0 lb

## 2022-08-01 DIAGNOSIS — L989 Disorder of the skin and subcutaneous tissue, unspecified: Secondary | ICD-10-CM | POA: Diagnosis not present

## 2022-08-01 DIAGNOSIS — I10 Essential (primary) hypertension: Secondary | ICD-10-CM | POA: Diagnosis not present

## 2022-08-01 DIAGNOSIS — R42 Dizziness and giddiness: Secondary | ICD-10-CM | POA: Insufficient documentation

## 2022-08-01 NOTE — Assessment & Plan Note (Signed)
This occurs with standing  Worse after lying on r side  Short lived Says it does not feel like vertigo Bp today is lower than usual , with standing systolic drops 10 pt  Pulse stable in 60s  Rev last cardiol note  Will cc cardiology  Enc fluids (not on fluid restriction) Inst to use walker (not cane) if he feels dizzy  Lab today to r/u anemia or other cause  Also ? If gabapentin may have worsened this so he is holding

## 2022-08-01 NOTE — Patient Instructions (Addendum)
If you feel dizzy- use a walker instead of a cane   Blood pressure change with standing may cause dizziness   Make sure you are drinking enough fluids   When you sit up in bed-do it slowly and let feet dangle for a while When you feel steady, then stand  When you stand - do it very slowly and take a few minutes before walking also   Let's check labs today   If dizziness does not improve let us know    Call Dr Gershon Crane office to check out your hand  Let us know if the are gets more red or painful

## 2022-08-01 NOTE — Progress Notes (Signed)
Subjective:    Patient ID: Ryan Morgan, male    DOB: Nov 01, 1940, 82 y.o.   MRN: [id number]  HPI Pt presents with c/o dizziness with standing for 2 weeks Rash /skin change on hand    Wt Readings from Last 3 Encounters:  08/01/22 229 lb (103.9 kg)  07/05/22 228 lb 9.6 oz (103.7 kg)  06/08/22 227 lb 6 oz (103.1 kg)   32.86 kg/m If he lay on R side- then gets up , feels like he will fall to the R  Usually clears in 3-4 minutes   Comes and goes  Does not feel like the room is spinning (not like vertigo)   No headache No sob  Used a cane   Checks bp when it happens and it is always above 130/70s    60-65 for pulse   Tot better and then it came back   H/o HTN bp is stable today  No cp or palpitations or headaches or edema  No side effects to medicines  BP Readings from Last 3 Encounters:  08/01/22 (!) 104/58  07/05/22 130/60  06/08/22 110/60      Re check today 121/60 lying and sitting  He felt dizzy from lying to sitting  110/60 standing   Pulse Readings from Last 3 Encounters:  08/01/22 66  07/05/22 65  06/08/22 (!) 59   Coreg 6.25 mg bid  Entresto 49-51 mg bid  Spironolactone 25 mg daily   Sees endo for DM  Past vertigo Neuropathy   Feels ok right now  Last symptoms were this am   Stopped gabapentin 4 d ago- it was his newest medicines  It did help his legs    Skin change on hand  1 month  Thickened area by first joint  Neosporin and band aide helped   Saw derm a month ago   Patient Active Problem List   Diagnosis Date Noted   Dizziness 08/01/2022   Skin lesion 08/01/2022   Diabetes mellitus ([place]) 07/05/2022   Peripheral neuropathy 02/02/2022   Allergic rhinitis 09/17/2021   Injury of nail bed of toe 09/17/2021   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/31/2019   Right leg pain 12/17/2018   Ischemic cardiomyopathy 09/10/2018   Hip joint effusion, left 05/03/2018   Laceration of arm 12/04/2017   Current chronic use of  systemic steroids 11/15/2016   Routine general medical examination at a health care facility 11/09/2015   Hearing loss 11/09/2015   History of colonic polyps 11/09/2015   Rash of hands 03/10/2015   Right ear pain 06/09/2014   Colon cancer screening 06/09/2014   Blepharitis, bilateral 06/02/2014   Benign paroxysmal positional vertigo 12/14/2013   Bursitis of right hip 10/17/2013   Coronary artery disease of native artery of native heart with stable angina pectoris ([place]) 03/19/2013   Encounter for Medicare annual wellness exam 03/05/2013   Adverse effect of glucocorticoid or synthetic analogue 03/05/2013   Osteopenia 03/05/2013   Class 1 obesity with serious comorbidity and body mass index (BMI) of 33.0 to 33.9 in adult 07/15/2011   Prostate cancer screening 07/10/2011   Hypertension 08/30/2010   Hyperlipidemia associated with type 2 diabetes mellitus ([place]) 08/30/2010   Type 2 diabetes mellitus with diabetic polyneuropathy, without long-term current use of insulin ([place]) 08/30/2010   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 prior LAD stenting 2000; b. [place] 2003 LAD 40, D1 50; c. Lexiscan 02/2017: large defect of severe severity in the basal inferoseptal, basal inferior, mid inferoseptal, mid inferior and apical inferior location, high risk; d. LHC 03/29/17: LM nl, patent LAD stent w/ 40% ISR, D1 100,  OM1 90, RPDA 100 w/ L-R collats, post atrio 80% s/p PCI/DES     Diabetes mellitus    Hyperlipidemia    Hypertension    Ischemic cardiomyopathy    a. TTE 02/2017: EF 30-35%, diffuse HK, normal LV diastolic function parameters, mild AI/MR, RV systolic function normal, PASP normal   Lung nodule    a. s/p prior thoracotomy   Morbid obesity  ([place])    Neuromuscular disorder ([place])    nerves lower back - causes numbness in bil feet & ankles   Wegener's granulomatosis 2007   Past Surgical History:  Procedure Laterality Date   BASAL CELL CARCINOMA EXCISION     removed from face x 3   CARDIAC CATHETERIZATION     CARDIAC SURGERY  05/30/1998   stent in heart   COLONOSCOPY     CORONARY ANGIOPLASTY  09/22/1998   s/p stent placement; [facility] 3.0 x 18 mm Ref [id number]   CORONARY STENT INTERVENTION N/A 03/29/2017   Procedure: CORONARY STENT INTERVENTION;  Surgeon: Wellington Hampshire, MD;  Location: [place] [facility];  Service: Cardiovascular;  Laterality: N/A;   LEFT HEART CATH AND CORONARY ANGIOGRAPHY N/A 03/29/2017   Procedure: LEFT HEART CATH AND CORONARY ANGIOGRAPHY;  Surgeon: Wellington Hampshire, MD;  Location: [place] [facility];  Service: Cardiovascular;  Laterality: N/A;   LUNG SURGERY     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   POLYPECTOMY     ULTRASOUND GUIDANCE FOR VASCULAR ACCESS  03/29/2017   Procedure: Ultrasound Guidance For Vascular Access;  Surgeon: Wellington Hampshire, MD;  Location: [place] [facility];  Service: Cardiovascular;;   Social History   Tobacco Use   Smoking status: Former    Packs/day: 0.50    Years: 20.00    Total pack years: 10.00    Types: Cigarettes    Quit date: 05/30/1978    Years since quitting: 44.2   Smokeless tobacco: Never  Vaping Use   Vaping Use: Never used  Substance Use Topics   Alcohol use: No    Alcohol/week: 0.0 standard drinks of alcohol   Drug use: No   Family History  Problem Relation Age of Onset   Cancer Mother        tumor on tonsil   Stroke Maternal Grandfather    Colon cancer Neg Hx    Esophageal cancer Neg Hx    Rectal cancer Neg Hx    Stomach cancer Neg Hx    Allergies  Allergen Reactions   Benicar Hct [Olmesartan Medoxomil-Hctz] Itching    Painful and itchy knots on palms of hands   Bystolic [Nebivolol Hcl] Other (See Comments)    headache   Dapsone  Hives   Lovaza [Omega-3-Acid Ethyl Esters] Other (See Comments)    Unknown reaction   Niaspan [Niacin Er] Other (See Comments)    flushing   Peanut-Containing Drug Products     Rash on hands from peanuts   Septra [Bactrim] Hives   Current Outpatient Medications on File Prior to Visit  Medication Sig Dispense Refill   acarbose (  PRECOSE) 25 MG tablet Take 1 tablet (25 mg total) by mouth 3 (three) times daily with meals. 300 tablet 3   carvedilol (COREG) 6.25 MG tablet TAKE 1 TABLET BY MOUTH TWICE DAILY WITH A MEAL 180 tablet 1   cetirizine (ZYRTEC) 10 MG tablet Take 10 mg by mouth 2 (two) times daily.      cholecalciferol (VITAMIN D3) 25 MCG (1000 UNIT) tablet Take 3,000 Units by mouth daily.     clobetasol cream (TEMOVATE) [id number] % Apply 1 application topically 2 (two) times daily. To affected areas 30 g 1   clopidogrel (PLAVIX) 75 MG tablet Take 1 tablet by mouth once daily 90 tablet 1   ezetimibe (ZETIA) 10 MG tablet Take 1 tablet by mouth once daily 90 tablet 2   fexofenadine (ALLEGRA) 180 MG tablet Take 180 mg by mouth daily.     fluticasone (FLONASE) 50 MCG/ACT nasal spray Place 2 sprays into both nostrils daily. 16 g 6   glucose blood (ONETOUCH ULTRA) test strip USE 1 STRIP TO CHECK GLUCOSE IN THE MORNING AND 1 AT BEDTIME 200 each 0   LANCETS ULTRA THIN 30G MISC by Does not apply route. One Touch     metFORMIN (GLUCOPHAGE-XR) 500 MG 24 hr tablet Take 1 tablet (500 mg total) by mouth daily with breakfast. 90 tablet 3   Multiple Vitamin (MULTIVITAMIN WITH MINERALS) TABS tablet Take 1 tablet by mouth daily.     Multiple Vitamins-Minerals (AIRBORNE) TBEF Take 1 tablet by mouth daily as needed (for immune system support). Immune Health/Support     nystatin cream (MYCOSTATIN) Apply 1 application topically 2 (two) times daily as needed for dry skin.     predniSONE (DELTASONE) 10 MG tablet Take 3 tabs po for 3 days, then 2 tabs po for 3 days 15 tablet 0   repaglinide (PRANDIN) 2 MG tablet Take  1 tablet (2 mg total) by mouth 3 (three) times daily before meals. 300 tablet 3   rosuvastatin (CRESTOR) 5 MG tablet Take 1 tablet (5 mg total) by mouth 3 (three) times a week. 1 tablet 0   sacubitril-valsartan (ENTRESTO) 49-51 MG TAKE 1 TABLET BY MOUTH TWICE DAILY (ADDITIONAL  REFILL  AVAILABLE  AT  YEARLY  VISIT) 60 tablet 9   spironolactone (ALDACTONE) 25 MG tablet Take 1 tablet by mouth once daily 90 tablet 1   tetrahydrozoline 0.05 % ophthalmic solution Place 1-2 drops into both eyes 3 (three) times daily as needed (for dry/irritated eyes).     gabapentin (NEURONTIN) 100 MG capsule Take 2 capsules (200 mg total) by mouth at bedtime. (Patient not taking: Reported on 08/01/2022) 180 capsule 3   No current facility-administered medications on file prior to visit.    Review of Systems  Constitutional:  Negative for activity change, appetite change, fatigue, fever and unexpected weight change.  HENT:  Negative for congestion, rhinorrhea, sore throat and trouble swallowing.   Eyes:  Negative for pain, redness, itching and visual disturbance.  Respiratory:  Negative for cough, chest tightness, shortness of breath and wheezing.   Cardiovascular:  Negative for chest pain and palpitations.  Gastrointestinal:  Negative for abdominal pain, blood in stool, constipation, diarrhea and nausea.  Endocrine: Negative for cold intolerance, heat intolerance, polydipsia and polyuria.  Genitourinary:  Negative for difficulty urinating, dysuria, frequency and urgency.  Musculoskeletal:  Positive for arthralgias. Negative for joint swelling and myalgias.  Skin:  Negative for pallor and rash.       Skin lesion   Neurological:  Positive for dizziness and light-headedness. Negative for tremors, seizures, syncope, facial asymmetry, speech difficulty, weakness, numbness and headaches.  Hematological:  Negative for adenopathy. Does not bruise/bleed easily.  Psychiatric/Behavioral:  Negative for decreased concentration  and dysphoric mood. The patient is not nervous/anxious.        Objective:   Physical Exam Constitutional:      General: He is not in acute distress.    Appearance: Normal appearance. He is well-developed. He is obese. He is not ill-appearing or diaphoretic.  HENT:     Head: Normocephalic and atraumatic.     Comments: No facial or temporal tenderness    Right Ear: Tympanic membrane, ear canal and external ear normal.     Left Ear: Tympanic membrane, ear canal and external ear normal.     Nose: Nose normal.     Mouth/Throat:     Mouth: Mucous membranes are moist.     Pharynx: No oropharyngeal exudate or posterior oropharyngeal erythema.  Eyes:     General: No scleral icterus.       Right eye: No discharge.        Left eye: No discharge.     Conjunctiva/sclera: Conjunctivae normal.     Pupils: Pupils are equal, round, and reactive to light.     Comments: No nystagmus  Neck:     Thyroid: No thyromegaly.     Vascular: No carotid bruit or JVD.     Trachea: No tracheal deviation.  Cardiovascular:     Rate and Rhythm: Normal rate and regular rhythm.     Heart sounds: Normal heart sounds. No murmur heard.    No gallop.  Pulmonary:     Effort: Pulmonary effort is normal. No respiratory distress.     Breath sounds: Normal breath sounds. No stridor. No wheezing, rhonchi or rales.     Comments: Diffusely distant bs  Abdominal:     General: Bowel sounds are normal. There is no distension or abdominal bruit.     Palpations: Abdomen is soft. There is no mass.     Tenderness: There is no abdominal tenderness.  Musculoskeletal:        General: No tenderness.     Cervical back: Full passive range of motion without pain, normal range of motion and neck supple. No tenderness.     Right lower leg: No edema.     Left lower leg: No edema.  Lymphadenopathy:     Cervical: No cervical adenopathy.  Skin:    General: Skin is warm and dry.     Coloration: Skin is not pale.     Findings:  Bruising, erythema and lesion present. No rash.     Comments: Solar lentigines diffusely Significant solar damage and aging Think skin on arms with some bruising  Scattered sks   L dorsal hand 3-4 mm area of keratosis that is mildly tender with small collar or erythema   Neurological:     Mental Status: He is alert and oriented to person, place, and time.     Cranial Nerves: No cranial nerve deficit or facial asymmetry.     Sensory: Sensation is intact. No sensory deficit.     Motor: No weakness, tremor, atrophy, abnormal muscle tone, seizure activity or pronator drift.     Coordination: Coordination is intact. Romberg sign negative. Coordination normal. Finger-Nose-Finger Test normal.     Gait: Gait is intact. Gait normal.     Deep Tendon Reflexes: Reflexes are normal and symmetric. Reflexes normal.  Comments: No focal cerebellar signs   Psychiatric:        Mood and Affect: Mood normal.        Behavior: Behavior normal.        Thought Content: Thought content normal.           Assessment & Plan:   Problem List Items Addressed This Visit       Cardiovascular and Mediastinum   Hypertension    Bp was lower than usual today  121/60 sitting and lying  110/60 standing   ? If orthostatic change causing his positional dizziness  Will cc cardiology  Pulse is stable      Relevant Orders   CBC with Differential/Platelet   Comprehensive metabolic panel     Musculoskeletal and Integument   Skin lesion    Thickened/keratotic area on dorsal L hand  Poss skin cancer Recommend f/u with derm- he will call for appt today        Other   Dizziness - Primary    This occurs with standing  Worse after lying on r side  Short lived Says it does not feel like vertigo Bp today is lower than usual , with standing systolic drops 10 pt  Pulse stable in 60s  Rev last cardiol note  Will cc cardiology  Enc fluids (not on fluid restriction) Inst to use walker (not cane) if he  feels dizzy  Lab today to r/u anemia or other cause  Also ? If gabapentin may have worsened this so he is holding        Relevant Orders   CBC with Differential/Platelet   Iron

## 2022-08-01 NOTE — Assessment & Plan Note (Signed)
Bp was lower than usual today  121/60 sitting and lying  110/60 standing   ? If orthostatic change causing his positional dizziness  Will cc cardiology  Pulse is stable

## 2022-08-01 NOTE — Assessment & Plan Note (Signed)
Thickened/keratotic area on dorsal L hand  Poss skin cancer Recommend f/u with derm- he will call for appt today

## 2022-08-02 ENCOUNTER — Ambulatory Visit: Payer: Medicare HMO | Admitting: Internal Medicine

## 2022-08-02 LAB — CBC WITH DIFFERENTIAL/PLATELET
Basophils Absolute: 0 10*3/uL (ref 0.0–0.1)
Basophils Relative: 0.5 % (ref 0.0–3.0)
Eosinophils Absolute: 0 10*3/uL (ref 0.0–0.7)
Eosinophils Relative: 0.9 % (ref 0.0–5.0)
HCT: 38.1 % — ABNORMAL LOW (ref 39.0–52.0)
Hemoglobin: 13.2 g/dL (ref 13.0–17.0)
Lymphocytes Relative: 18.5 % (ref 12.0–46.0)
Lymphs Abs: 0.8 10*3/uL (ref 0.7–4.0)
MCHC: 34.6 g/dL (ref 30.0–36.0)
MCV: 93.8 fl (ref 78.0–100.0)
Monocytes Absolute: 0.3 10*3/uL (ref 0.1–1.0)
Monocytes Relative: 5.6 % (ref 3.0–12.0)
Neutro Abs: 3.4 10*3/uL (ref 1.4–7.7)
Neutrophils Relative %: 74.5 % (ref 43.0–77.0)
Platelets: 185 10*3/uL (ref 150.0–400.0)
RBC: 4.06 Mil/uL — ABNORMAL LOW (ref 4.22–5.81)
RDW: 14 % (ref 11.5–15.5)
WBC: 4.5 10*3/uL (ref 4.0–10.5)

## 2022-08-02 LAB — COMPREHENSIVE METABOLIC PANEL
ALT: 12 U/L (ref 0–53)
AST: 14 U/L (ref 0–37)
Albumin: 3.9 g/dL (ref 3.5–5.2)
Alkaline Phosphatase: 52 U/L (ref 39–117)
BUN: 13 mg/dL (ref 6–23)
CO2: 28 mEq/L (ref 19–32)
Calcium: 9.3 mg/dL (ref 8.4–10.5)
Chloride: 104 mEq/L (ref 96–112)
Creatinine, Ser: 0.98 mg/dL (ref 0.40–1.50)
GFR: 73.09 mL/min (ref >60.00)
Glucose, Bld: 170 mg/dL — ABNORMAL HIGH (ref 70–99)
Potassium: 5.4 mEq/L — ABNORMAL HIGH (ref 3.5–5.1)
Sodium: 139 mEq/L (ref 135–145)
Total Bilirubin: 0.7 mg/dL (ref 0.2–1.2)
Total Protein: 6.5 g/dL (ref 6.0–8.3)

## 2022-08-02 LAB — IRON: Iron: 99 ug/dL (ref 42–165)

## 2022-08-04 DIAGNOSIS — Z85828 Personal history of other malignant neoplasm of skin: Secondary | ICD-10-CM | POA: Diagnosis not present

## 2022-08-04 DIAGNOSIS — D485 Neoplasm of uncertain behavior of skin: Secondary | ICD-10-CM | POA: Diagnosis not present

## 2022-08-04 DIAGNOSIS — C44629 Squamous cell carcinoma of skin of left upper limb, including shoulder: Secondary | ICD-10-CM | POA: Diagnosis not present

## 2022-08-04 DIAGNOSIS — Z08 Encounter for follow-up examination after completed treatment for malignant neoplasm: Secondary | ICD-10-CM | POA: Diagnosis not present

## 2022-08-04 DIAGNOSIS — L570 Actinic keratosis: Secondary | ICD-10-CM | POA: Diagnosis not present

## 2022-08-08 ENCOUNTER — Other Ambulatory Visit: Payer: Self-pay | Admitting: *Deleted

## 2022-08-08 DIAGNOSIS — E875 Hyperkalemia: Secondary | ICD-10-CM

## 2022-08-29 DIAGNOSIS — C44629 Squamous cell carcinoma of skin of left upper limb, including shoulder: Secondary | ICD-10-CM | POA: Diagnosis not present

## 2022-08-29 DIAGNOSIS — L578 Other skin changes due to chronic exposure to nonionizing radiation: Secondary | ICD-10-CM | POA: Diagnosis not present

## 2022-08-29 DIAGNOSIS — L814 Other melanin hyperpigmentation: Secondary | ICD-10-CM | POA: Diagnosis not present

## 2022-10-18 DIAGNOSIS — I7782 Antineutrophilic cytoplasmic antibody (ANCA) vasculitis: Secondary | ICD-10-CM | POA: Diagnosis not present

## 2022-10-18 DIAGNOSIS — I10 Essential (primary) hypertension: Secondary | ICD-10-CM | POA: Diagnosis not present

## 2022-10-21 DIAGNOSIS — S0502XA Injury of conjunctiva and corneal abrasion without foreign body, left eye, initial encounter: Secondary | ICD-10-CM | POA: Diagnosis not present

## 2022-11-17 ENCOUNTER — Other Ambulatory Visit: Payer: Self-pay | Admitting: Cardiovascular Disease

## 2022-12-06 ENCOUNTER — Other Ambulatory Visit: Payer: Medicare HMO | Admitting: Pharmacist

## 2022-12-06 NOTE — Progress Notes (Signed)
12/06/2022 Name: Ryan Morgan MRN: [id number] DOB: [birth year]  Chief Complaint  Patient presents with   Medication Management   Diabetes    Ryan Morgan is a 82 y.o. year old male who presented for a telephone visit.   They were referred to the pharmacist by their PCP for assistance in managing medication access. I was on the phone with the patient's wife and he asked to speak with me    Subjective:  Care Team: Primary Care Provider: [name], MD ; Next Scheduled Visit: not scheduled Endocrinology: [facility]; Next Scheduled Visit: 01/03/26  Medication Access/Adherence  Current Pharmacy:  [facility] [address] [place] - [address] [place] [address] Phone: [phone] Fax: [phone]   Patient reports affordability concerns with their medications: Yes  Patient reports access/transportation concerns to their pharmacy: No  Patient reports adherence concerns with their medications:  No     Diabetes:  Current medications: metformin XR 500 mg daily, acarbose 25 mg three times daily, repaglinide 2 mg three times daily  Medications tried in the past: Jardiance/Farxiga - became expensive   Hyperlipidemia/ASCVD Risk Reduction  Current lipid lowering medications: rosuvastatin 5 mg daily, ezetimibe 10 mg daily  Antiplatelet regimen: clopidogrel 75 mg daily  Heart Failure (EF 35-40%)  Current medications:  ACEi/ARB/ARNI: Entresto 49/51 mg twice daily SGLT2i: none Beta blocker: carvedilol 6.25 mg twice daily Mineralocorticoid Receptor Antagonist: spironolactone 25 mg daily Diuretic regimen: none   Current medication access support: previously enrolled for [employer]omy[employer] Fund   Objective:  Lab Results  Component Value Date   HGBA1C 7.3 (A) 07/05/2022    Lab Results  Component Value Date   CREATININE 0.98 08/01/2022   BUN 13 08/01/2022   NA 139 08/01/2022   K 5.4 (H) 08/01/2022   CL 104 08/01/2022   CO2 28  08/01/2022    Lab Results  Component Value Date   CHOL 98 09/17/2021   HDL 37.50 (L) 09/17/2021   LDLCALC 28 09/17/2021   LDLDIRECT 100.0 05/17/2017   TRIG 161.0 (H) 09/17/2021   CHOLHDL 3 09/17/2021    Medications Reviewed Today     Reviewed by Alden Hipp, RPH-CPP (Pharmacist) on 12/06/22 at 1107  Med List Status: <None>   Medication Order Taking? Sig Documenting Provider Last Dose Status Informant  acarbose (PRECOSE) 25 MG tablet [id number] Yes Take 1 tablet (25 mg total) by mouth 3 (three) times daily with meals. Shamleffer, Konrad Dolores, MD Taking Active   carvedilol (COREG) 6.25 MG tablet [id number] Yes TAKE 1 TABLET BY MOUTH TWICE DAILY WITH A MEAL Gollan, Tollie Pizza, MD Taking Active   cetirizine (ZYRTEC) 10 MG tablet [id number] Yes Take 10 mg by mouth 2 (two) times daily.  [provider] Taking Active Multiple Informants  cholecalciferol (VITAMIN D3) 25 MCG (1000 UNIT) tablet [id number] Yes Take 3,000 Units by mouth daily. [provider] Taking Active Multiple Informants  clobetasol cream (TEMOVATE) 0.05 % [id number]  Apply 1 application topically 2 (two) times daily. To affected areas Eustaquio Boyden, MD  Active Multiple Informants  clopidogrel (PLAVIX) 75 MG tablet [id number] Yes Take 1 tablet by mouth once daily Gollan, Tollie Pizza, MD Taking Active   ezetimibe (ZETIA) 10 MG tablet [id number] Yes Take 1 tablet by mouth once daily Antonieta Iba, MD Taking Active   fexofenadine (ALLEGRA) 180 MG tablet [id number] Yes Take 180 mg by mouth daily. [provider] Taking Active   fluticasone (FLONASE) 50 MCG/ACT nasal spray  [id number] Yes Place 2 sprays into both nostrils daily. Tower, Audrie Gallus, MD Taking Active   gabapentin (NEURONTIN) 100 MG capsule [id number]  Take 2 capsules (200 mg total) by mouth at bedtime.  Patient not taking: Reported on 08/01/2022   [name], MD  Active   glucose blood [facility] ULTRA) test strip [id number]  USE 1 STRIP  TO CHECK GLUCOSE IN THE MORNING AND 1 AT BEDTIME Shamleffer, Konrad Dolores, MD  Active   LANCETS ULTRA THIN 30G MISC [id number]  by Does not apply route. One Touch [provider]  Active Multiple Informants  metFORMIN (GLUCOPHAGE-XR) 500 MG 24 hr tablet [id number] Yes Take 1 tablet (500 mg total) by mouth daily with breakfast. Shamleffer, Konrad Dolores, MD Taking Active   Multiple Vitamin (MULTIVITAMIN WITH MINERALS) TABS tablet [id number] Yes Take 1 tablet by mouth daily. [provider] Taking Active Multiple Informants  Multiple Vitamins-Minerals (AIRBORNE) TBEF [id number] Yes Take 1 tablet by mouth daily as needed (for immune system support). Immune Health/Support [provider] Taking Active Multiple Informants  nystatin cream (MYCOSTATIN) [id number]  Apply 1 application topically 2 (two) times daily as needed for dry skin. [provider]  Active Multiple Informants  repaglinide (PRANDIN) 2 MG tablet [id number] Yes Take 1 tablet (2 mg total) by mouth 3 (three) times daily before meals. Shamleffer, Konrad Dolores, MD Taking Active   rosuvastatin (CRESTOR) 5 MG tablet [id number] Yes Take 1 tablet (5 mg total) by mouth 3 (three) times a week. Tower, Audrie Gallus, MD Taking Active   sacubitril-valsartan (ENTRESTO) 49-51 MG [id number] Yes TAKE 1 TABLET BY MOUTH TWICE DAILY (ADDITIONAL  REFILL  AVAILABLE  AT  YEARLY  VISIT) Gollan, Tollie Pizza, MD Taking Active   spironolactone (ALDACTONE) 25 MG tablet [id number] Yes Take 1 tablet by mouth once daily Antonieta Iba, MD Taking Active   tetrahydrozoline 0.05 % ophthalmic solution [id number] Yes Place 1-2 drops into both eyes 3 (three) times daily as needed (for dry/irritated eyes). [provider] Taking Active Multiple Informants              Assessment/Plan:   Diabetes: - Currently controlled but with potential for other preferred agents - Discussed that he would be able to re-start Jardiance or [place]  with a $0 copay with current approval for [employer]. Will notify PCP/Cardiology/Endocrinology.  - Also notified patient that he would also qualify for Ozempic assistance from [employer], should his team want to add for CV benefit. He is currently not interested in changing diabetic regimen, even if it would reduce CV risk and he could get for free.   Hyperlipidemia/ASCVD Risk Reduction: - Currently controlled.  - Recommend to continue current regimen at this time    Heart Failure: - Currently options for optimization - Recommend to consider addition of SGLT2 as copay would be $0.  - Assisted in re-enrollment in [employer] Cardiomyopathy Fund thruogh 11/2023.    Follow Up Plan: pending medication adjustments.   Catie Eppie Gibson, PharmD, BCACP, CPP Clinical Pharmacist [facility] [phone]

## 2022-12-06 NOTE — Patient Instructions (Addendum)
Ryan Morgan,   We have re-enrolled you in the [employer]omyopathy Fund to assist with copays for heart failure medications. This will include Entresto and spironolactone. I will call this updated information to the pharmacy later this week.   You would also be able to get [place] or Jardiance for a $0 copay from this fund. I will let Dr. Milinda Antis, Dr. Mariah Milling, and Dr. Lonzo Cloud know this.   Catie Eppie Gibson, PharmD, BCACP, CPP Clinical Pharmacist [facility] [phone]

## 2022-12-20 ENCOUNTER — Other Ambulatory Visit: Payer: Self-pay | Admitting: Pharmacist

## 2022-12-20 MED ORDER — EMPAGLIFLOZIN 10 MG PO TABS: 10.0000 mg | ORAL_TABLET | Freq: Every day | ORAL | 2 refills | Status: DC

## 2022-12-20 NOTE — Progress Notes (Signed)
Care Coordination Call  Spoke with patient. He is in agreement with restarting Jardiance 10 mg daily, as agreed upon by primary care, endo, and cardiology. Script sent with co-sign with pharmacy billing information for [employer]. Contacted pharmacy to confirm medication was billed appropriately.   Patient asks if this will replace repaglinide or acarbose. He describes fasting readings as ~ 120-140s and describes some elevated post prandial in 200-300. Advised to continue both repaglinide and acarbose for now, start Jardiance, and discuss adjustments with Dr. Lonzo Cloud in 2 weeks.   Catie Eppie Gibson, PharmD, BCACP, CPP Clinical Pharmacist [facility] [phone]

## 2022-12-25 ENCOUNTER — Other Ambulatory Visit: Payer: Self-pay | Admitting: Cardiovascular Disease

## 2023-01-04 ENCOUNTER — Encounter: Payer: Self-pay | Admitting: Internal Medicine

## 2023-01-04 ENCOUNTER — Ambulatory Visit: Payer: Medicare HMO | Admitting: Internal Medicine

## 2023-01-04 VITALS — BP 120/80 | HR 65 | Ht 70.0 in | Wt 224.0 lb

## 2023-01-04 DIAGNOSIS — E1159 Type 2 diabetes mellitus with other circulatory complications: Secondary | ICD-10-CM | POA: Diagnosis not present

## 2023-01-04 DIAGNOSIS — Z7984 Long term (current) use of oral hypoglycemic drugs: Secondary | ICD-10-CM

## 2023-01-04 DIAGNOSIS — E1142 Type 2 diabetes mellitus with diabetic polyneuropathy: Secondary | ICD-10-CM

## 2023-01-04 LAB — POCT GLUCOSE (DEVICE FOR HOME USE): POC Glucose: 229 mg/dl — AB (ref 70–99)

## 2023-01-04 LAB — POCT GLYCOSYLATED HEMOGLOBIN (HGB A1C): Hemoglobin A1C: 6.9 % — AB (ref 4.0–5.6)

## 2023-01-04 NOTE — Progress Notes (Signed)
Name: Ryan Morgan  Age/ Sex: 82 y.o., male   MRN/ DOB: [id number], 04-Mar-1941     PCP: [name], MD   Reason for Endocrinology Evaluation: Type 2 Diabetes Mellitus  Initial Endocrine Consultative Visit: 06/15/2017    PATIENT IDENTIFIER: [name] is a 82 y.o. male with a past medical history of Dm, HTN, CHF, and dyslipidemia . The patient has followed with Endocrinology clinic since 06/15/2017 for consultative assistance with management of his diabetes.  DIABETIC HISTORY:  Mr. Perkey was diagnosed with DM 1998. His hemoglobin A1c has ranged from 6.7% in 2016, peaking at 8.4% in 2021.  Patient was followed by Dr. Everardo All from 2019 until 05/2021  He was started on Jardiance 12/2022 through the pharmacist program using [employer]  SUBJECTIVE:   During the last visit (07/05/2022): A1c 7.3%     Today (01/04/2023): [name] is here for follow-up on diabetes management.  He is accompanied by his spouse today.  He checks his blood sugars 1 times daily. The patient has not had hypoglycemic episodes since the last clinic visit.  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  He was started on Jardiance through clinical pharmacist program, he is getting Jardiance through the [employer], he has only been on it a week  Denies UTI/genital   Denies nausea, vomiting  Denies constipation or diarrhea   HOME DIABETES REGIMEN:  Metformin 500 mg daily Repaglinide 2 mg 3 times daily Acarbose 25 mg 3 times daily Jardiance 10 mg daily   Statin: Yes ACE-I/ARB: Yes    METER DOWNLOAD SUMMARY: n/a    DIABETIC COMPLICATIONS: Microvascular complications:  neuropathy Denies: CKD Last Eye Exam: Completed 2023  Macrovascular complications:  CAD Denies:  CVA,  PVD   HISTORY:  Past Medical History:  Past Medical History:  Diagnosis Date   Cancer (HCC)    Basal cell ca of nose   Clotting disorder (HCC)    to prevent CVS per pt - stent x2   Coronary artery disease    a. prior LAD stenting 2000; b. LHC 2003 LAD 40, D1 50; c. Lexiscan 02/2017: large defect of severe severity in the basal inferoseptal, basal inferior, mid inferoseptal, mid inferior and apical inferior location, high risk; d. LHC 03/29/17: LM nl, patent LAD stent w/ 40% ISR, D1 100,  OM1 90, RPDA 100 w/ L-R collats, post atrio 80% s/p PCI/DES     Diabetes mellitus    Hyperlipidemia    Hypertension    Ischemic cardiomyopathy    a. TTE 02/2017: EF 30-35%, diffuse HK, normal LV diastolic function parameters, mild AI/MR, RV systolic function normal, PASP normal   Lung nodule    a. s/p prior thoracotomy   Morbid obesity (HCC)    Neuromuscular disorder (HCC)    nerves lower back - causes numbness in bil feet & ankles   Wegener's granulomatosis 2007   Past Surgical History:  Past Surgical History:  Procedure Laterality Date   BASAL CELL CARCINOMA EXCISION     removed from face x 3   CARDIAC CATHETERIZATION     CARDIAC SURGERY  05/30/1998   stent in heart   COLONOSCOPY     CORONARY ANGIOPLASTY  09/22/1998   s/p stent placement; Tristar 3.0 x 18 mm Ref [id number]   CORONARY STENT INTERVENTION N/A 03/29/2017  Procedure: CORONARY STENT INTERVENTION;  Surgeon: Iran Ouch, MD;  Location: [facility];  Service: Cardiovascular;  Laterality: N/A;   LEFT HEART CATH AND CORONARY ANGIOGRAPHY N/A 03/29/2017   Procedure: LEFT HEART CATH AND CORONARY ANGIOGRAPHY;  Surgeon: Iran Ouch, MD;  Location: [facility];  Service: Cardiovascular;  Laterality: N/A;   LUNG SURGERY     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   POLYPECTOMY     ULTRASOUND GUIDANCE FOR VASCULAR ACCESS  03/29/2017   Procedure: Ultrasound Guidance For Vascular Access;  Surgeon: Iran Ouch, MD;  Location: [facility] INVASIVE CV LAB;  Service: Cardiovascular;;   Social History:  reports that he quit smoking about 44 years ago. His smoking use included cigarettes. He started smoking about 64 years ago. He has a 10 pack-year smoking history. He has never used smokeless tobacco. He reports that he does not drink alcohol and does not use drugs. Family History:  Family History  Problem Relation Age of Onset   Cancer Mother        tumor on tonsil   Stroke Maternal Grandfather    Colon cancer Neg Hx    Esophageal cancer Neg Hx    Rectal cancer Neg Hx    Stomach cancer Neg Hx      HOME MEDICATIONS: Allergies as of 01/04/2023       Reactions   Benicar Hct [olmesartan Medoxomil-hctz] Itching   Painful and itchy knots on palms of hands   Bystolic [nebivolol Hcl] Other (See Comments)   headache   Dapsone Hives   Lovaza [omega-3-acid Ethyl Esters] Other (See Comments)   Unknown reaction   Niaspan [niacin Er] Other (See Comments)   flushing   Peanut-containing Drug Products    Rash on hands from peanuts   Septra [bactrim] Hives        Medication List        Accurate as of January 04, 2023 12:39 PM. If you have any questions, ask your nurse or doctor.          STOP taking these medications    acarbose 25 MG tablet Commonly known as: PRECOSE Stopped by: [name]       TAKE these medications    Airborne Tbef Take 1 tablet by mouth daily as needed (for immune system support). Immune Health/Support   carvedilol 6.25 MG tablet Commonly known as: COREG TAKE 1 TABLET BY MOUTH TWICE DAILY WITH A MEAL   cetirizine 10 MG tablet Commonly known as: ZYRTEC Take 10 mg by mouth 2 (two) times daily.   cholecalciferol 25 MCG (1000 UNIT) tablet Commonly known as: VITAMIN D3 Take 3,000 Units by mouth daily.   clobetasol cream 0.05 % Commonly known as: TEMOVATE Apply 1 application topically 2 (two) times daily. To affected areas   clopidogrel 75 MG  tablet Commonly known as: PLAVIX Take 1 tablet by mouth once daily   empagliflozin 10 MG Tabs tablet Commonly known as: Jardiance Take 1 tablet (10 mg total) by mouth daily before breakfast.   Entresto 49-51 MG Generic drug: sacubitril-valsartan TAKE 1 TABLET BY MOUTH TWICE DAILY (ADDITIONAL  REFILL  AVAILABLE  AT  YEARLY  VISIT)   ezetimibe 10 MG tablet Commonly known as: ZETIA Take 1 tablet by mouth once daily   fexofenadine 180 MG tablet Commonly known as: ALLEGRA Take 180 mg by mouth daily.   fluticasone 50 MCG/ACT nasal spray Commonly known as: FLONASE Place 2 sprays into both nostrils daily.  gabapentin 100 MG capsule Commonly known as: NEURONTIN Take 2 capsules (200 mg total) by mouth at bedtime.   Lancets Ultra Thin 30G Misc by Does not apply route. One Touch   metFORMIN 500 MG 24 hr tablet Commonly known as: GLUCOPHAGE-XR Take 1 tablet (500 mg total) by mouth daily with breakfast.   multivitamin with minerals Tabs tablet Take 1 tablet by mouth daily.   nystatin cream Commonly known as: MYCOSTATIN Apply 1 application topically 2 (two) times daily as needed for dry skin.   OneTouch Ultra test strip Generic drug: glucose blood USE 1 STRIP TO CHECK GLUCOSE IN THE MORNING AND 1 AT BEDTIME   repaglinide 2 MG tablet Commonly known as: PRANDIN Take 1 tablet (2 mg total) by mouth 3 (three) times daily before meals.   rosuvastatin 5 MG tablet Commonly known as: CRESTOR Take 1 tablet (5 mg total) by mouth 3 (three) times a week.   spironolactone 25 MG tablet Commonly known as: ALDACTONE Take 1 tablet by mouth once daily   tetrahydrozoline 0.05 % ophthalmic solution Place 1-2 drops into both eyes 3 (three) times daily as needed (for dry/irritated eyes).   Voltaren Arthritis Pain 1 % Gel Generic drug: diclofenac Sodium Apply 2 g topically 4 (four) times daily.         OBJECTIVE:   Vital Signs: BP 120/80 (BP Location: Right Arm, Patient Position:  Sitting, Cuff Size: Large)   Pulse 65   Ht 5\' 10"  (1.778 m)   Wt 224 lb (101.6 kg)   SpO2 96%   BMI 32.14 kg/m   Wt Readings from Last 3 Encounters:  01/04/23 224 lb (101.6 kg)  08/01/22 229 lb (103.9 kg)  07/05/22 228 lb 9.6 oz (103.7 kg)     Exam: General: Pt appears well and is in NAD  Lungs: Clear with good BS bilat   Heart: RRR   Extremities: No pretibial edema.   Neuro: MS is good with appropriate affect, pt is alert and Ox3    DM foot exam: 07/05/2022  The skin of the feet is intact without sores or ulcerations. The pedal pulses are 1+ on right and 1+ on left. The sensation is absent to a screening 5.07, 10 gram monofilament bilaterally     DATA REVIEWED:  Lab Results  Component Value Date   HGBA1C 6.9 (A) 01/04/2023   HGBA1C 7.3 (A) 07/05/2022   HGBA1C 7.0 (A) 06/28/2021    Latest Reference Range & Units 08/01/22 14:44  COMPREHENSIVE METABOLIC PANEL  Rpt !  Sodium 135 - 145 mEq/L 139  Potassium 3.5 - 5.1 mEq/L 5.4 (H)  Chloride 96 - 112 mEq/L 104  CO2 19 - 32 mEq/L 28  Glucose 70 - 99 mg/dL 784 (H)  BUN 6 - 23 mg/dL 13  Creatinine 6.96 - 2.95 mg/dL 2.84  Calcium 8.4 - 13.2 mg/dL 9.3  Alkaline Phosphatase 39 - 117 U/L 52  Albumin 3.5 - 5.2 g/dL 3.9  AST 0 - 37 U/L 14  ALT 0 - 53 U/L 12  Total Protein 6.0 - 8.3 g/dL 6.5  Total Bilirubin 0.2 - 1.2 mg/dL 0.7  GFR >44.01 mL/min 73.09    Old records , labs and images have been reviewed.    ASSESSMENT / PLAN / RECOMMENDATIONS:   1) Type 2 Diabetes Mellitus, Optimally controlled, With Neuropathic and macrovascular  complications - Most recent A1c of 6.9 %. Goal A1c < 7.5 %.    -A1c at goal -No history of intolerance to metformin -  He has only been on Jardiance for a week, tolerating well, will discontinue carbose   MEDICATIONS: Continue metformin 500 mg daily Continue repaglinide 2 mg 3 times daily before every meal Continue Jardiance 10 mg daily Stop acarbose 25 mg 3 times daily before every  meal  EDUCATION / INSTRUCTIONS: BG monitoring instructions: Patient is instructed to check his blood sugars 1 times a day. Call [facility] if: BG persistently < 70  I reviewed the Rule of 15 for the treatment of hypoglycemia in detail with the patient. Literature supplied.    2) Diabetic complications:  Eye: Does not have known diabetic retinopathy.  Neuro/ Feet: Does  have known diabetic peripheral neuropathy .  Renal: Patient does not have known baseline CKD. He   is  on an ACEI/ARB at present.      F/U in 6 months     Signed electronically by: Lyndle Herrlich, MD  [facility] Endocrinology  [facility] [address] [place], [place] [address] Phone: [phone] FAX: [phone]   CC: Tower, Audrie Gallus, MD [address] [place] [address] Phone: [phone]  Fax: [phone]  Return to Endocrinology clinic as below: Future Appointments  Date Time Provider Department Center  04/21/2023  9:20 AM Antonieta Iba, MD [facility] None  07/11/2023 11:30 AM Moss Berry, Konrad Dolores, MD [facility] None

## 2023-01-04 NOTE — Patient Instructions (Addendum)
Stop Acarbose  Continue Metformin 500 mg daily Continue Repaglinide 2 mg 3 times daily before each meal  Continue Jardiance 10 mg daily    HOW TO TREAT LOW BLOOD SUGARS (Blood sugar LESS THAN 70 MG/DL) Please follow the RULE OF 15 for the treatment of hypoglycemia treatment (when your (blood sugars are less than 70 mg/dL)   STEP 1: Take 15 grams of carbohydrates when your blood sugar is low, which includes:  3-4 GLUCOSE TABS  OR 3-4 OZ OF JUICE OR REGULAR SODA OR ONE TUBE OF GLUCOSE GEL    STEP 2: RECHECK blood sugar in 15 MINUTES STEP 3: If your blood sugar is still low at the 15 minute recheck --> then, go back to STEP 1 and treat AGAIN with another 15 grams of carbohydrates.

## 2023-01-10 ENCOUNTER — Other Ambulatory Visit: Payer: Self-pay | Admitting: Cardiovascular Disease

## 2023-02-16 ENCOUNTER — Other Ambulatory Visit: Payer: Self-pay | Admitting: Internal Medicine

## 2023-02-16 DIAGNOSIS — E11311 Type 2 diabetes mellitus with unspecified diabetic retinopathy with macular edema: Secondary | ICD-10-CM

## 2023-03-04 ENCOUNTER — Other Ambulatory Visit: Payer: Self-pay | Admitting: Cardiovascular Disease

## 2023-03-06 DIAGNOSIS — H903 Sensorineural hearing loss, bilateral: Secondary | ICD-10-CM | POA: Diagnosis not present

## 2023-03-06 DIAGNOSIS — M3130 Wegener's granulomatosis without renal involvement: Secondary | ICD-10-CM | POA: Diagnosis not present

## 2023-03-06 DIAGNOSIS — H6123 Impacted cerumen, bilateral: Secondary | ICD-10-CM | POA: Diagnosis not present

## 2023-03-17 ENCOUNTER — Other Ambulatory Visit: Payer: Self-pay | Admitting: Family Medicine

## 2023-03-17 NOTE — Telephone Encounter (Signed)
Pt sees endo now for DM, will route to them.

## 2023-04-14 DIAGNOSIS — L300 Nummular dermatitis: Secondary | ICD-10-CM | POA: Diagnosis not present

## 2023-04-21 ENCOUNTER — Ambulatory Visit: Payer: Medicare HMO | Attending: Cardiovascular Disease | Admitting: Cardiovascular Disease

## 2023-04-21 ENCOUNTER — Encounter: Payer: Self-pay | Admitting: Cardiovascular Disease

## 2023-04-21 VITALS — BP 110/58 | HR 57 | Ht 70.0 in | Wt 226.2 lb

## 2023-04-21 DIAGNOSIS — R0989 Other specified symptoms and signs involving the circulatory and respiratory systems: Secondary | ICD-10-CM

## 2023-04-21 DIAGNOSIS — T466X5D Adverse effect of antihyperlipidemic and antiarteriosclerotic drugs, subsequent encounter: Secondary | ICD-10-CM

## 2023-04-21 DIAGNOSIS — I10 Essential (primary) hypertension: Secondary | ICD-10-CM

## 2023-04-21 DIAGNOSIS — Z79899 Other long term (current) drug therapy: Secondary | ICD-10-CM | POA: Diagnosis not present

## 2023-04-21 DIAGNOSIS — E7800 Pure hypercholesterolemia, unspecified: Secondary | ICD-10-CM

## 2023-04-21 DIAGNOSIS — I255 Ischemic cardiomyopathy: Secondary | ICD-10-CM

## 2023-04-21 DIAGNOSIS — E11319 Type 2 diabetes mellitus with unspecified diabetic retinopathy without macular edema: Secondary | ICD-10-CM | POA: Diagnosis not present

## 2023-04-21 DIAGNOSIS — T466X5A Adverse effect of antihyperlipidemic and antiarteriosclerotic drugs, initial encounter: Secondary | ICD-10-CM

## 2023-04-21 DIAGNOSIS — R0602 Shortness of breath: Secondary | ICD-10-CM

## 2023-04-21 DIAGNOSIS — I25118 Atherosclerotic heart disease of native coronary artery with other forms of angina pectoris: Secondary | ICD-10-CM | POA: Diagnosis not present

## 2023-04-21 DIAGNOSIS — M791 Myalgia, unspecified site: Secondary | ICD-10-CM | POA: Diagnosis not present

## 2023-04-21 MED ORDER — EZETIMIBE 10 MG PO TABS: ORAL_TABLET | ORAL | 3 refills | Status: DC

## 2023-04-21 MED ORDER — ENTRESTO 49-51 MG PO TABS: 1.0000 | ORAL_TABLET | Freq: Two times a day (BID) | ORAL | 3 refills | Status: DC

## 2023-04-21 MED ORDER — CLOPIDOGREL BISULFATE 75 MG PO TABS: 75.0000 mg | ORAL_TABLET | Freq: Every day | ORAL | 3 refills | Status: DC

## 2023-04-21 MED ORDER — SPIRONOLACTONE 25 MG PO TABS: 25.0000 mg | ORAL_TABLET | Freq: Every day | ORAL | 3 refills | Status: DC

## 2023-04-21 MED ORDER — CARVEDILOL 6.25 MG PO TABS: 6.2500 mg | ORAL_TABLET | Freq: Two times a day (BID) | ORAL | 3 refills | Status: DC

## 2023-04-21 NOTE — Progress Notes (Signed)
Date:  04/21/2023   ID:  [name], DOB November 17, 1940, MRN [id number]  Patient Location:  [address] [place] [address]   Provider location:   [facility], [place] office  PCP:  [name], MD  Cardiologist:  [facility]  Chief Complaint  Patient presents with   12 month follow up     Patient stopped the Crestor due to extreme pain in legs. Medications reviewed by the patient verbally.     History of Present Illness:    Ryan Morgan is a 82 y.o. male with  past medical history of DM,  smoking hx,  coronary artery disease,  stent placed to his LAD in 2000,  (3.0 x 18 mm) repeat catheterization in January 2003 with 40% LAD, 50% diagonal disease, history of Wegener's granulomatosis, on chronic steroids diabetes. Prior thoracotomy surgery on the left for lung nodule, chronic pain on the left that is episodic, stable over several years Chronic shortness of breath, sedentary lifestyle, obesity/deconditioning Stent October 2018 for shortness of breath, chest tightness, fatigue Echocardiogram September 12, 2017 Ejection fraction 30 to 35% 2018:Successful angioplasty and drug-eluting stent placement to the ostial right posterior AV groove artery. Ejection fraction 35 to 40% in 2021 who presents for follow up of his CAD and shortness of breath   Last seen in clinic November 2023  In follow-up today he reports having myalgias on Crestor every other day Stopped crestor, reports myalgias improved, feels better Tolerate Zetia 10 mg daily No lab work done this year  Reports he was started on Jardiance several months ago Initially appreciated palpitations, increased urination, this has gone back to normal  Denies chest pain concerning for angina No leg swelling, no PND orthopnea  Chronic back pain with sciatica No regular walking program Neuropathy in his legs, numbness in legs after standing for 10 minutes  Echo 1/24 Ejection fraction remains mildly  depressed 35 to 40%  Lab work reviewed A1C 6.9 Total chol 98, now off crestor  EKG personally reviewed by myself on todays visit EKG Interpretation Date/Time:  Friday April 21 2023 09:32:04 EST Ventricular Rate:  57 PR Interval:  180 QRS Duration:  108 QT Interval:  434 QTC Calculation: 422 R Axis:   7  Text Interpretation: Sinus bradycardia Inferior infarct , age undetermined Cannot rule out Anterior infarct , age undetermined When compared with ECG of 12-Sep-2019 09:20, PREVIOUS ECG IS PRESENT Confirmed by Julien Nordmann ([id number]) on 04/21/2023 9:38:37 AM   Other past medical hx March 2020 Did not feel well, may have had some nausea, diaphoresis Got inside, nearsyncope  Felt by the hospital team that he could have had a vagal event given the  prodrome of nausea and sweating Tropon negative x3.  Lab work looks normal, no sense of dehydration He does not remember much of a prodrome of nausea or sweating before feeling dizzy Heart rate 55 and higher in the hospital, into the 60s  Prior CV studies:   The following studies were reviewed today:  Other past medical history reviewed Echocardiogram October 2018 ejection fraction 30 to 35%   Stress test October 2018 prior MI Prior MI, no significant ischemia Ejection fraction 36%   Cardiac catheterization October 2018 Patent LAD stent mild to moderate in-stent restenosis Occluded diagonal #1 Occluded right PDA with left-to-right collaterals Severe disease right posterior AV groove artery with collaterals Ejection fraction 40% Successful angioplasty stent to ostial right posterior AV groove artery Ejection fraction at  that time 40%   Echocardiogram September 12, 2017 Ejection fraction 30 to 35%   Stress test from January 2009 was a treadmill study, ejection fraction estimated at 45%, with medium-sized area of moderate to severe hypoperfusion involving the septal region.  Past Medical History:  Diagnosis Date   Cancer (HCC)     Basal cell ca of nose   Clotting disorder (HCC)    to prevent CVS per pt - stent x2   Coronary artery disease    a. prior LAD stenting 2000; b. LHC 2003 LAD 40, D1 50; c. Lexiscan 02/2017: large defect of severe severity in the basal inferoseptal, basal inferior, mid inferoseptal, mid inferior and apical inferior location, high risk; d. LHC 03/29/17: LM nl, patent LAD stent w/ 40% ISR, D1 100,  OM1 90, RPDA 100 w/ L-R collats, post atrio 80% s/p PCI/DES     Diabetes mellitus    Hyperlipidemia    Hypertension    Ischemic cardiomyopathy    a. TTE 02/2017: EF 30-35%, diffuse HK, normal LV diastolic function parameters, mild AI/MR, RV systolic function normal, PASP normal   Lung nodule    a. s/p prior thoracotomy   Morbid obesity (HCC)    Neuromuscular disorder (HCC)    nerves lower back - causes numbness in bil feet & ankles   Wegener's granulomatosis 2007   Past Surgical History:  Procedure Laterality Date   BASAL CELL CARCINOMA EXCISION     removed from face x 3   CARDIAC CATHETERIZATION     CARDIAC SURGERY  05/30/1998   stent in heart   COLONOSCOPY     CORONARY ANGIOPLASTY  09/22/1998   s/p stent placement; Tristar 3.0 x 18 mm Ref [id number]   CORONARY STENT INTERVENTION N/A 03/29/2017   Procedure: CORONARY STENT INTERVENTION;  Surgeon: Iran Ouch, MD;  Location: [facility];  Service: Cardiovascular;  Laterality: N/A;   LEFT HEART CATH AND CORONARY ANGIOGRAPHY N/A 03/29/2017   Procedure: LEFT HEART CATH AND CORONARY ANGIOGRAPHY;  Surgeon: Iran Ouch, MD;  Location: [facility]VAS[facility];  Service: Cardiovascular;  Laterality: N/A;   LUNG SURGERY     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   POLYPECTOMY     ULTRASOUND GUIDANCE FOR VASCULAR ACCESS  03/29/2017   Procedure: Ultrasound Guidance For Vascular Access;  Surgeon: Iran Ouch, MD;  Location: [facility] INVASIVE CV LAB;  Service: Cardiovascular;;     Current Meds  Medication Sig   acarbose (PRECOSE)  25 MG tablet Take 25 mg by mouth 3 (three) times daily.   carvedilol (COREG) 6.25 MG tablet TAKE 1 TABLET BY MOUTH TWICE DAILY WITH A MEAL   cetirizine (ZYRTEC) 10 MG tablet Take 10 mg by mouth 2 (two) times daily.    cholecalciferol (VITAMIN D3) 25 MCG (1000 UNIT) tablet Take 3,000 Units by mouth daily.   clobetasol cream (TEMOVATE) 0.05 % Apply 1 application topically 2 (two) times daily. To affected areas   clopidogrel (PLAVIX) 75 MG tablet Take 1 tablet by mouth once daily   diclofenac Sodium (VOLTAREN ARTHRITIS PAIN) 1 % GEL Apply 2 g topically 4 (four) times daily.   ezetimibe (ZETIA) 10 MG tablet Take 1 tablet by mouth once daily   fexofenadine (ALLEGRA) 180 MG tablet Take 180 mg by mouth daily.   fluticasone (FLONASE) 50 MCG/ACT nasal spray Place 2 sprays into both nostrils daily.   gabapentin (NEURONTIN) 100 MG capsule Take 2 capsules (200 mg total) by mouth at bedtime.  glucose blood (ONETOUCH ULTRA TEST) test strip USE 1 STRIP TO CHECK GLUCOSE IN THE MORNING AND 1 AT BEDTIME   JARDIANCE 10 MG TABS tablet TAKE 1 TABLET BY MOUTH ONCE DAILY BEFORE BREAKFAST   LANCETS ULTRA THIN 30G MISC by Does not apply route. One Touch   metFORMIN (GLUCOPHAGE-XR) 500 MG 24 hr tablet Take 1 tablet (500 mg total) by mouth daily with breakfast.   Multiple Vitamin (MULTIVITAMIN WITH MINERALS) TABS tablet Take 1 tablet by mouth daily.   Multiple Vitamins-Minerals (AIRBORNE) TBEF Take 1 tablet by mouth daily as needed (for immune system support). Immune Health/Support   nystatin cream (MYCOSTATIN) Apply 1 application topically 2 (two) times daily as needed for dry skin.   repaglinide (PRANDIN) 2 MG tablet Take 1 tablet (2 mg total) by mouth 3 (three) times daily before meals.   sacubitril-valsartan (ENTRESTO) 49-51 MG TAKE 1 TABLET BY MOUTH TWICE DAILY (ADDITIONAL  REFILL  AVAILABLE  AT  YEARLY  VISIT)   spironolactone (ALDACTONE) 25 MG tablet Take 1 tablet by mouth once daily   tetrahydrozoline 0.05 %  ophthalmic solution Place 1-2 drops into both eyes 3 (three) times daily as needed (for dry/irritated eyes).     Allergies:   Benicar hct [olmesartan medoxomil-hctz], Bystolic [nebivolol hcl], Dapsone, Lovaza [omega-3-acid ethyl esters (fish)], Niaspan [niacin er (antihyperlipidemic)], Peanut-containing drug products, and Septra [bactrim]   Social History   Tobacco Use   Smoking status: Former    Current packs/day: 0.00    Average packs/day: 0.5 packs/day for 20.0 years (10.0 ttl pk-yrs)    Types: Cigarettes    Start date: 05/30/1958    Quit date: 05/30/1978    Years since quitting: 44.9   Smokeless tobacco: Never  Vaping Use   Vaping status: Never Used  Substance Use Topics   Alcohol use: No    Alcohol/week: 0.0 standard drinks of alcohol   Drug use: No     Family Hx: The patient's family history includes Cancer in his mother; Stroke in his maternal grandfather. There is no history of Colon cancer, Esophageal cancer, Rectal cancer, or Stomach cancer.  ROS:   Please see the history of present illness.    Review of Systems  Constitutional: Negative.   Respiratory: Negative.    Cardiovascular: Negative.   Gastrointestinal: Negative.   Musculoskeletal:  Positive for back pain.  Neurological: Negative.   Psychiatric/Behavioral: Negative.    All other systems reviewed and are negative.    Labs/Other Tests and Data Reviewed:    Recent Labs: 08/01/2022: ALT 12; BUN 13; Creatinine, Ser 0.98; Hemoglobin 13.2; Platelets 185.0; Potassium 5.4; Sodium 139   Recent Lipid Panel Lab Results  Component Value Date/Time   CHOL 98 09/17/2021 12:26 PM   CHOL 185 09/23/2020 09:14 AM   TRIG 161.0 (H) 09/17/2021 12:26 PM   HDL 37.50 (L) 09/17/2021 12:26 PM   HDL 34 (L) 09/23/2020 09:14 AM   CHOLHDL 3 09/17/2021 12:26 PM   LDLCALC 28 09/17/2021 12:26 PM   LDLCALC 104 (H) 09/23/2020 09:14 AM   LDLDIRECT 100.0 05/17/2017 01:49 PM    Wt Readings from Last 3 Encounters:  04/21/23 226 lb 4 oz  (102.6 kg)  01/04/23 224 lb (101.6 kg)  08/01/22 229 lb (103.9 kg)     Exam:    Vital Signs: Vital signs may also be detailed in the HPI BP (!) 110/58 (BP Location: Left Arm, Patient Position: Sitting, Cuff Size: Normal)   Pulse (!) 57   Ht 5\' 10"  (1.778  m)   Wt 226 lb 4 oz (102.6 kg)   SpO2 98%   BMI 32.46 kg/m   Constitutional:  oriented to person, place, and time. No distress.  HENT:  Head: Grossly normal Eyes:  no discharge. No scleral icterus.  Neck: No JVD, no carotid bruits  Cardiovascular: Regular rate and rhythm, no murmurs appreciated Pulmonary/Chest: Clear to auscultation bilaterally, no wheezes or rails Abdominal: Soft.  no distension.  no tenderness.  Musculoskeletal: Normal range of motion Neurological:  normal muscle tone. Coordination normal. No atrophy Skin: Skin warm and dry Psychiatric: normal affect, pleasant  ASSESSMENT & PLAN:   Coronary artery disease of native artery of native heart with stable angina pectoris (HCC) Currently with no symptoms of angina. No further workup at this time. Continue current medication regimen.  Granulomatosis with polyangiitis, unspecified whether renal involvement (HCC) Stable symptoms  Morbid obesity (HCC) Unable to exercise secondary to chronic back pain Recommended that calorie restriction  Essential hypertension Blood pressure is well controlled on today's visit. No changes made to the medications.  Shortness of breath Recommended regular walking program Exercise limited secondary to chronic back pain, leg neuropathy  Ischemic cardiomyopathy Continue Entresto, carvedilol, spironolactone, Jardiance Ejection fraction 35 to 40%, stable, appears euvolemic, not on Lasix CMP ordered given he is now on Jardiance  Near syncope Denies recent near-syncope or syncope episodes  Hyperlipidemia Tolerating Zetia, statin myalgias with Lipitor and Crestor Stopped Crestor 1 month ago, repeat lipid panel today May need to  consider bempedoic acid or PCSK9 inhibitor  Carotid bruit on right Carotid ultrasound ordered  Signed, Julien Nordmann, MD  04/21/2023 9:38 AM    [facility] [address], [place], [place] [address]

## 2023-04-21 NOTE — Patient Instructions (Addendum)
Medication Instructions:  No changes  If you need a refill on your cardiac medications before your next appointment, please call your pharmacy.   Lab work: Lipids, CMP  today  Testing/Procedures: Your physician has requested that you have a carotid duplex. This test is an ultrasound of the carotid arteries in your neck. It looks at blood flow through these arteries that supply the brain with blood.   Allow one hour for this exam.  There are no restrictions or special instructions.  This will take place at [address] [facility] [address]) [address], [place] [address]  Please note: We ask at that you not bring children with you during ultrasound (echo/ vascular) testing. Due to room size and safety concerns, children are not allowed in the ultrasound rooms during exams. Our front office staff cannot provide observation of children in our lobby area while testing is being conducted. An adult accompanying a patient to their appointment will only be allowed in the ultrasound room at the discretion of the ultrasound technician under special circumstances. We apologize for any inconvenience.   Follow-Up: At [facility], you and your health needs are our priority.  As part of our continuing mission to provide you with exceptional heart care, we have created designated Provider Care Teams.  These Care Teams include your primary Cardiologist (physician) and Advanced Practice Providers (APPs -  Physician Assistants and Nurse Practitioners) who all work together to provide you with the care you need, when you need it.  You will need a follow up appointment in 12 months  Providers on your designated Care Team:   Nicolasa Ducking, NP Eula Listen, PA-C Cadence Fransico Michael, [place]  COVID-19 Vaccine Information can be found at: [email or web address] For questions related to vaccine distribution or appointments, please email [email or web address] or  call [phone].

## 2023-04-22 LAB — COMPREHENSIVE METABOLIC PANEL
ALT: 7 [IU]/L (ref 0–44)
AST: 9 [IU]/L (ref 0–40)
Albumin: 3.9 g/dL (ref 3.8–4.8)
Alkaline Phosphatase: 75 [IU]/L (ref 44–121)
BUN/Creatinine Ratio: 16 (ref 10–24)
BUN: 17 mg/dL (ref 8–27)
Bilirubin Total: 0.4 mg/dL (ref 0.0–1.2)
CO2: 22 mmol/L (ref 20–29)
Calcium: 8.5 mg/dL — ABNORMAL LOW (ref 8.6–10.2)
Chloride: 108 mmol/L — ABNORMAL HIGH (ref 96–106)
Creatinine, Ser: 1.09 mg/dL (ref 0.76–1.27)
Globulin, Total: 2.1 g/dL (ref 1.5–4.5)
Glucose: 165 mg/dL — ABNORMAL HIGH (ref 70–99)
Potassium: 4.7 mmol/L (ref 3.5–5.2)
Sodium: 143 mmol/L (ref 134–144)
Total Protein: 6 g/dL (ref 6.0–8.5)
eGFR: 69 mL/min/{1.73_m2} (ref >59)

## 2023-04-22 LAB — LIPID PANEL
Chol/HDL Ratio: 4.7 ratio (ref 0.0–5.0)
Cholesterol, Total: 169 mg/dL (ref 100–199)
HDL: 36 mg/dL — ABNORMAL LOW (ref >39)
LDL Chol Calc (NIH): 98 mg/dL (ref 0–99)
Triglycerides: 202 mg/dL — ABNORMAL HIGH (ref 0–149)
VLDL Cholesterol Cal: 35 mg/dL (ref 5–40)

## 2023-04-24 ENCOUNTER — Telehealth: Payer: Self-pay | Admitting: Cardiovascular Disease

## 2023-04-24 ENCOUNTER — Telehealth: Payer: Self-pay | Admitting: Emergency Medicine

## 2023-04-24 DIAGNOSIS — E113393 Type 2 diabetes mellitus with moderate nonproliferative diabetic retinopathy without macular edema, bilateral: Secondary | ICD-10-CM | POA: Diagnosis not present

## 2023-04-24 DIAGNOSIS — Q898 Other specified congenital malformations: Secondary | ICD-10-CM | POA: Diagnosis not present

## 2023-04-24 DIAGNOSIS — H2513 Age-related nuclear cataract, bilateral: Secondary | ICD-10-CM | POA: Diagnosis not present

## 2023-04-24 DIAGNOSIS — S0502XA Injury of conjunctiva and corneal abrasion without foreign body, left eye, initial encounter: Secondary | ICD-10-CM | POA: Diagnosis not present

## 2023-04-24 LAB — HM DIABETES EYE EXAM

## 2023-04-24 MED ORDER — REPATHA SURECLICK 140 MG/ML ~~LOC~~ SOAJ: 140.0000 mg | SUBCUTANEOUS | 3 refills | Status: AC

## 2023-04-24 NOTE — Telephone Encounter (Signed)
Called patient and notified him of the following from Dr. Mariah Milling.  Lab work reviewed  Normal complete metabolic panel  Cholesterol improved from 2 years ago but still above goal  We need LDL less than 55, currently 98  Given intolerance to statins  Would recommend Repatha 140 subcu injection every 2 weeks, stay on Zetia   Patient verbalizes understanding. Prescription sent to preferred.

## 2023-04-24 NOTE — Telephone Encounter (Signed)
Pt returning call to nurse for results

## 2023-04-24 NOTE — Telephone Encounter (Signed)
See other telephone encounter.

## 2023-04-26 ENCOUNTER — Other Ambulatory Visit: Payer: Self-pay | Admitting: Internal Medicine

## 2023-05-05 ENCOUNTER — Telehealth: Payer: Self-pay | Admitting: Cardiovascular Disease

## 2023-05-05 NOTE — Telephone Encounter (Signed)
Pt c/o medication issue:  1. Name of Medication:   Evolocumab (REPATHA SURECLICK) 140 MG/ML SOAJ   2. How are you currently taking this medication (dosage and times per day)?   3. Are you having a reaction (difficulty breathing--STAT)?   4. What is your medication issue?   Patient/Wife Geneticist, molecular) stated patient is unable to get this medication and the pharmacy told them they will need to get approval from the patient's insurance company.  Wife stated patient has not started on this medication as yet.

## 2023-05-08 ENCOUNTER — Other Ambulatory Visit (HOSPITAL_COMMUNITY): Payer: Self-pay

## 2023-05-08 NOTE — Telephone Encounter (Signed)
Pharmacy Patient Advocate Encounter   Received notification from Physician's Office that prior authorization for REPATHA is required/requested.   Insurance verification completed.   The patient is insured through [facility] .   Per test claim: PA required; PA submitted to above mentioned insurance via CoverMyMeds Key/confirmation #/EOC [id number] Status is pending

## 2023-05-08 NOTE — Telephone Encounter (Signed)
Spoke with patient and informed him of the following:  "Received notification from [facility] that Prior Authorization for REPATHA has been APPROVED [facility] 05/30/23. Ran test claim, Copay is $138. This test claim was processed through [facility]- copay amounts may vary at other pharmacies due to pharmacy/plan contracts, or as the patient moves through the different stages of their insurance plan."  Patient understood and stated that he would call his pharmacy

## 2023-05-08 NOTE — Telephone Encounter (Addendum)
Pharmacy Patient Advocate Encounter  Received notification from [facility] that Prior Authorization for REPATHA has been APPROVED [facility] 05/30/23. Ran test claim, Copay is $138. This test claim was processed through [facility]- copay amounts may vary at other pharmacies due to pharmacy/plan contracts, or as the patient moves through the different stages of their insurance plan.

## 2023-05-09 ENCOUNTER — Telehealth: Payer: Self-pay | Admitting: Cardiovascular Disease

## 2023-05-09 NOTE — Telephone Encounter (Signed)
Patient's spouse called and said that medication Repatha is expensive to get. Want to know if they could apply and get patient assistance to help with medication

## 2023-05-11 NOTE — Telephone Encounter (Signed)
Spoke with patients wife per release form about assistance with Repatha. Reviewed necessary information needed for application and required signatures. They will be by today to get those signed. No further needs

## 2023-05-18 ENCOUNTER — Ambulatory Visit: Payer: Medicare HMO | Attending: Cardiovascular Disease

## 2023-05-18 DIAGNOSIS — R0989 Other specified symptoms and signs involving the circulatory and respiratory systems: Secondary | ICD-10-CM

## 2023-05-22 ENCOUNTER — Encounter: Payer: Self-pay | Admitting: Emergency Medicine

## 2023-06-01 ENCOUNTER — Other Ambulatory Visit: Payer: Self-pay | Admitting: Pharmacist

## 2023-06-01 ENCOUNTER — Encounter: Payer: Self-pay | Admitting: Pharmacist

## 2023-06-01 DIAGNOSIS — E1169 Type 2 diabetes mellitus with other specified complication: Secondary | ICD-10-CM

## 2023-06-01 NOTE — Progress Notes (Signed)
 Upon speaking with patient's wife today about medication assistance, they mentioned that patent recently start Repatha  injections and paid ~$200 for the first month prescription.   We discussed eligibility for the [facility] [employer] - Hypercholesterolemia grant.  Looks like patient is already enrolled in the cardiomyopathy grant through 12/21/2023. He confirms that he has not been paying anything for Jardiance  or Entresto .    [employer] Hypercholesterolemia - NEW enrollment 2025   Medication(s): All cholesterol medications (Repatha )   Application Status:  Approved for new enrollment    HealthWell: ID [id number] Fund: Hypercholesterolemia - Medicare Access Assistance Type: Co-pay Start Date: 05/02/2023 End Date: 04/30/2024               Rx Card: Card No.  [id number] RX BIN:  [id number] PCN:  [id number] Group:  [id number]     [name], PharmD Clinical Pharmacist [facility] [phone]

## 2023-06-01 NOTE — Patient Instructions (Signed)
 You have been enrolled in the [employer] Foundation Hyperlipidemia [place]. This grant helps Medicare patients pay for their cholesterol medications at the pharmacy. This is very similar to the program that helps to pay for your heart medications (Jardiance  and Entresto ).  Your HealthWell ID: [id number]   HealthWell Foundation: Website: https://www.healthwellfoundation.org/fund/hypercholesterolemia-medicare-access/ Phone: 6462895216 M-F 9am-5pm   This program works similar to a biochemist, clinical card or insurance card. Your pharmacy will continue to bill your cholesterol medicine through Medicare, then they will bill the HealthWell savings card to cover your copay.   Your HealthWell savings card is electronic (not a physical card).  I have already provided this information to your [facility] pharmacist.   You should not have a copay when you pick up your Repatha  refills. If you do, then please ask them to run the savings card.  I've included the card numbers below though they are on your [facility] profile.    Your HealthWell Savings Card Numbers:         Pharmacy Card: Card No.  [id number] RX BIN:  [id number] PCN:  [id number] Group:  [id number]

## 2023-06-06 DIAGNOSIS — I10 Essential (primary) hypertension: Secondary | ICD-10-CM | POA: Diagnosis not present

## 2023-06-06 DIAGNOSIS — R768 Other specified abnormal immunological findings in serum: Secondary | ICD-10-CM | POA: Diagnosis not present

## 2023-06-06 DIAGNOSIS — I7782 Antineutrophilic cytoplasmic antibody (ANCA) vasculitis: Secondary | ICD-10-CM | POA: Diagnosis not present

## 2023-06-21 ENCOUNTER — Other Ambulatory Visit: Payer: Self-pay | Admitting: Family Medicine

## 2023-06-21 NOTE — Telephone Encounter (Signed)
Please schedule annual exam when able

## 2023-06-21 NOTE — Telephone Encounter (Signed)
Last filled on 03/28/22 #180 caps/ 3 refill  Last OV was an acute appt on 08/01/22

## 2023-06-22 DIAGNOSIS — L821 Other seborrheic keratosis: Secondary | ICD-10-CM | POA: Diagnosis not present

## 2023-06-22 DIAGNOSIS — L570 Actinic keratosis: Secondary | ICD-10-CM | POA: Diagnosis not present

## 2023-06-22 DIAGNOSIS — Z08 Encounter for follow-up examination after completed treatment for malignant neoplasm: Secondary | ICD-10-CM | POA: Diagnosis not present

## 2023-06-22 DIAGNOSIS — Z85828 Personal history of other malignant neoplasm of skin: Secondary | ICD-10-CM | POA: Diagnosis not present

## 2023-06-22 DIAGNOSIS — D485 Neoplasm of uncertain behavior of skin: Secondary | ICD-10-CM | POA: Diagnosis not present

## 2023-06-22 DIAGNOSIS — C44519 Basal cell carcinoma of skin of other part of trunk: Secondary | ICD-10-CM | POA: Diagnosis not present

## 2023-06-22 NOTE — Telephone Encounter (Signed)
[email or web address]

## 2023-07-06 DIAGNOSIS — I7782 Antineutrophilic cytoplasmic antibody (ANCA) vasculitis: Secondary | ICD-10-CM | POA: Diagnosis not present

## 2023-07-06 DIAGNOSIS — R768 Other specified abnormal immunological findings in serum: Secondary | ICD-10-CM | POA: Diagnosis not present

## 2023-07-11 ENCOUNTER — Ambulatory Visit (INDEPENDENT_AMBULATORY_CARE_PROVIDER_SITE_OTHER): Payer: Medicare HMO | Admitting: Internal Medicine

## 2023-07-11 ENCOUNTER — Encounter: Payer: Self-pay | Admitting: Internal Medicine

## 2023-07-11 ENCOUNTER — Other Ambulatory Visit: Payer: Self-pay | Admitting: Internal Medicine

## 2023-07-11 VITALS — BP 110/70 | HR 58 | Ht 70.0 in | Wt 224.0 lb

## 2023-07-11 DIAGNOSIS — Z7984 Long term (current) use of oral hypoglycemic drugs: Secondary | ICD-10-CM

## 2023-07-11 DIAGNOSIS — E1142 Type 2 diabetes mellitus with diabetic polyneuropathy: Secondary | ICD-10-CM | POA: Diagnosis not present

## 2023-07-11 DIAGNOSIS — E1159 Type 2 diabetes mellitus with other circulatory complications: Secondary | ICD-10-CM

## 2023-07-11 DIAGNOSIS — G63 Polyneuropathy in diseases classified elsewhere: Secondary | ICD-10-CM

## 2023-07-11 LAB — POCT GLUCOSE (DEVICE FOR HOME USE): POC Glucose: 173 mg/dL — AB (ref 70–99)

## 2023-07-11 LAB — POCT GLYCOSYLATED HEMOGLOBIN (HGB A1C): Hemoglobin A1C: 6.5 % — AB (ref 4.0–5.6)

## 2023-07-11 MED ORDER — GABAPENTIN 300 MG PO CAPS: 300.0000 mg | ORAL_CAPSULE | Freq: Every day | ORAL | 3 refills | Status: AC

## 2023-07-11 NOTE — Progress Notes (Signed)
Name: Ryan Morgan  Age/ Sex: 83 y.o., male   MRN/ DOB: [id number], 1940/09/12     PCP: Ryan Pimple, MD   Reason for Endocrinology Evaluation: Type 2 Diabetes Mellitus  Initial Endocrine Consultative Visit: 06/15/2017    PATIENT IDENTIFIER: Ryan Morgan is a 83 y.o. male with a past medical history of Dm, HTN, CHF, and dyslipidemia . The patient has followed with Endocrinology clinic since 06/15/2017 for consultative assistance with management of his diabetes.  DIABETIC HISTORY:  Ryan Morgan was diagnosed with DM 1998. His hemoglobin A1c has ranged from 6.7% in 2016, peaking at 8.4% in 2021.  Patient was followed by Dr. Everardo All from 2019 until 05/2021  He was started on Jardiance 12/2022 through the pharmacist program using healthwell foundation  SUBJECTIVE:   During the last visit (01/04/2023): A1c 6.9 %     Today (07/11/2023): Ryan Morgan is here for follow-up on diabetes management.  He is accompanied by his spouse today.  He checks his blood sugars 1 times daily. The patient has not had hypoglycemic episodes since the last clinic visit.  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  He is on gabapentin 100 mg, takes 2 tabs at bedtime Patient had noted transient change in vision, last eye exam was 2-3 weeks ago, patient advised to follow-up with ophthalmology again given new changes.  BG at the time 167 Mg/DL    HOME DIABETES REGIMEN:  Metformin 500 mg daily Repaglinide 2 mg 3 times daily Jardiance 10 mg daily   Statin: Yes ACE-I/ARB: Yes    METER DOWNLOAD SUMMARY: n/a    DIABETIC COMPLICATIONS: Microvascular complications:  Neuropathy, retinopathy Denies:  CKD Last Eye Exam: Completed 04/24/2023  Macrovascular complications:  CAD Denies:  CVA, PVD   HISTORY:  Past Medical History:  Past Medical History:  Diagnosis Date   Cancer (HCC)    Basal cell ca of nose   Clotting disorder (HCC)    to prevent CVS per pt - stent x2   Coronary artery disease    a. prior LAD stenting 2000; b. LHC 2003 LAD 40, D1 50; c. Lexiscan 02/2017: large defect of severe severity in the basal inferoseptal, basal inferior, mid inferoseptal, mid inferior and apical inferior location, high risk; d. LHC 03/29/17: LM nl, patent LAD stent w/ 40% ISR, D1 100,  OM1 90, RPDA 100 w/ L-R collats, post atrio 80% s/p PCI/DES     Diabetes mellitus    Hyperlipidemia    Hypertension    Ischemic cardiomyopathy    a. TTE 02/2017: EF 30-35%, diffuse HK, normal LV diastolic function parameters, mild AI/MR, RV systolic function normal, PASP normal   Lung nodule    a. s/p prior thoracotomy   Morbid obesity (HCC)    Neuromuscular disorder (HCC)    nerves lower back - causes numbness in bil feet & ankles   Wegener's granulomatosis 2007   Past Surgical History:  Past Surgical History:  Procedure Laterality Date   BASAL CELL CARCINOMA EXCISION     removed from face x 3   CARDIAC CATHETERIZATION     CARDIAC SURGERY  05/30/1998   stent in heart   COLONOSCOPY     CORONARY ANGIOPLASTY  09/22/1998  s/p stent placement; [employer] 3.0 x 18 mm Ref [id number]   CORONARY STENT INTERVENTION N/A 03/29/2017   Procedure: CORONARY STENT INTERVENTION;  Surgeon: Iran Ouch, MD;  Location: [facility] INVASIVE CV LAB;  Service: Cardiovascular;  Laterality: N/A;   LEFT HEART CATH AND CORONARY ANGIOGRAPHY N/A 03/29/2017   Procedure: LEFT HEART CATH AND CORONARY ANGIOGRAPHY;  Surgeon: Iran Ouch, MD;  Location: [facility] INVASIVE CV LAB;  Service: Cardiovascular;  Laterality: N/A;   LUNG SURGERY     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   POLYPECTOMY     ULTRASOUND GUIDANCE FOR VASCULAR ACCESS   03/29/2017   Procedure: Ultrasound Guidance For Vascular Access;  Surgeon: Iran Ouch, MD;  Location: [facility] INVASIVE CV LAB;  Service: Cardiovascular;;   Social History:  reports that he quit smoking about 45 years ago. His smoking use included cigarettes. He started smoking about 65 years ago. He has a 10 pack-year smoking history. He has never used smokeless tobacco. He reports that he does not drink alcohol and does not use drugs. Family History:  Family History  Problem Relation Age of Onset   Cancer Mother        tumor on tonsil   Stroke Maternal Grandfather    Colon cancer Neg Hx    Esophageal cancer Neg Hx    Rectal cancer Neg Hx    Stomach cancer Neg Hx      HOME MEDICATIONS: Allergies as of 07/11/2023       Reactions   Benicar Hct [olmesartan Medoxomil-hctz] Itching   Painful and itchy knots on palms of hands   Bystolic [nebivolol Hcl] Other (See Comments)   headache   Dapsone Hives   Lovaza [omega-3-acid Ethyl Esters (fish)] Other (See Comments)   Unknown reaction   Niaspan [niacin Er (antihyperlipidemic)] Other (See Comments)   flushing   Peanut-containing Drug Products    Rash on hands from peanuts   Septra [bactrim] Hives        Medication List        Accurate as of July 11, 2023 11:44 AM. If you have any questions, ask your nurse or doctor.          acarbose 25 MG tablet Commonly known as: PRECOSE Take 25 mg by mouth 3 (three) times daily.   Airborne Tbef Take 1 tablet by mouth daily as needed (for immune system support). Immune Health/Support   carvedilol 6.25 MG tablet Commonly known as: COREG Take 1 tablet (6.25 mg total) by mouth 2 (two) times daily with a meal.   cetirizine 10 MG tablet Commonly known as: ZYRTEC Take 10 mg by mouth 2 (two) times daily.   cholecalciferol 25 MCG (1000 UNIT) tablet Commonly known as: VITAMIN D3 Take 3,000 Units by mouth daily.   clobetasol cream 0.05 % Commonly known as: TEMOVATE Apply 1  application topically 2 (two) times daily. To affected areas   clopidogrel 75 MG tablet Commonly known as: PLAVIX Take 1 tablet (75 mg total) by mouth daily.   Entresto 49-51 MG Generic drug: sacubitril-valsartan Take 1 tablet by mouth 2 (two) times daily. TAKE 1 TABLET BY MOUTH TWICE DAILY   ezetimibe 10 MG tablet Commonly known as: ZETIA Take 1 tablet by mouth once daily   fexofenadine 180 MG tablet Commonly known as: ALLEGRA Take 180 mg by mouth daily.   fluticasone 50 MCG/ACT nasal spray Commonly known as: FLONASE Place 2 sprays into both nostrils daily.   gabapentin 100 MG capsule Commonly known as: NEURONTIN  TAKE 2 CAPSULES BY MOUTH AT BEDTIME   Jardiance 10 MG Tabs tablet Generic drug: empagliflozin TAKE 1 TABLET BY MOUTH ONCE DAILY BEFORE BREAKFAST   Jardiance 10 MG Tabs tablet Generic drug: empagliflozin Take 1 tablet by mouth daily.   Lancets Ultra Thin 30G Misc by Does not apply route. One Touch   metFORMIN 500 MG 24 hr tablet Commonly known as: GLUCOPHAGE-XR Take 1 tablet (500 mg total) by mouth daily with breakfast.   multivitamin with minerals Tabs tablet Take 1 tablet by mouth daily.   nystatin cream Commonly known as: MYCOSTATIN Apply 1 application topically 2 (two) times daily as needed for dry skin.   OneTouch Ultra Test test strip Generic drug: glucose blood USE 1 STRIP TO CHECK GLUCOSE IN THE MORNING AND 1 AT BEDTIME   repaglinide 2 MG tablet Commonly known as: PRANDIN Take 1 tablet (2 mg total) by mouth 3 (three) times daily before meals.   Repatha SureClick 140 MG/ML Soaj Generic drug: Evolocumab Inject 140 mg into the skin every 14 (fourteen) days.   spironolactone 25 MG tablet Commonly known as: ALDACTONE Take 1 tablet (25 mg total) by mouth daily.   tetrahydrozoline 0.05 % ophthalmic solution Place 1-2 drops into both eyes 3 (three) times daily as needed (for dry/irritated eyes).   Voltaren Arthritis Pain 1 % Gel Generic  drug: diclofenac Sodium Apply 2 g topically 4 (four) times daily.         OBJECTIVE:   Vital Signs: BP 110/70 (BP Location: Right Arm, Patient Position: Sitting, Cuff Size: Normal)   Pulse (!) 58   Ht 5\' 10"  (1.778 m)   Wt 224 lb (101.6 kg)   SpO2 96%   BMI 32.14 kg/m   Wt Readings from Last 3 Encounters:  07/11/23 224 lb (101.6 kg)  04/21/23 226 lb 4 oz (102.6 kg)  01/04/23 224 lb (101.6 kg)     Exam: General: Pt appears well and is in NAD  Lungs: Clear with good BS bilat   Heart: RRR   Extremities: No pretibial edema.   Neuro: MS is good with appropriate affect, pt is alert and Ox3    DM foot exam: 07/11/2023  The skin of the feet is intact without sores or ulcerations. The pedal pulses are 1+ on right and 1+ on left. The sensation is absent to a screening 5.07, 10 gram monofilament bilaterally     DATA REVIEWED:  Lab Results  Component Value Date   HGBA1C 6.5 (A) 07/11/2023   HGBA1C 6.9 (A) 01/04/2023   HGBA1C 7.3 (A) 07/05/2022    Latest Reference Range & Units 04/21/23 10:19  Sodium 134 - 144 mmol/L 143  Potassium 3.5 - 5.2 mmol/L 4.7  Chloride 96 - 106 mmol/L 108 (H)  CO2 20 - 29 mmol/L 22  Glucose 70 - 99 mg/dL 403 (H)  BUN 8 - 27 mg/dL 17  Creatinine 4.74 - 2.59 mg/dL 5.63  Calcium 8.6 - 87.5 mg/dL 8.5 (L)  BUN/Creatinine Ratio 10 - 24  16  eGFR >59 mL/min/1.73 69  Alkaline Phosphatase 44 - 121 IU/L 75  Albumin 3.8 - 4.8 g/dL 3.9  AST 0 - 40 IU/L 9  ALT 0 - 44 IU/L 7  Total Protein 6.0 - 8.5 g/dL 6.0  Total Bilirubin 0.0 - 1.2 mg/dL 0.4  (H): Data is abnormally high (L): Data is abnormally low   Old records , labs and images have been reviewed.    ASSESSMENT / PLAN / RECOMMENDATIONS:  1) Type 2 Diabetes Mellitus, Optimally controlled, With Neuropathic and macrovascular  complications - Most recent A1c of 6.9 %. Goal A1c < 7.5 %.    -A1c at goal -No history of intolerance to metformin -I discontinued the acarbose, but he would like  to finish what he has at home so he continues to take it -No changes at this time   MEDICATIONS: Continue metformin 500 mg daily Continue repaglinide 2 mg 3 times daily before every meal Continue Jardiance 10 mg daily  EDUCATION / INSTRUCTIONS: BG monitoring instructions: Patient is instructed to check his blood sugars 1 times a day. Call [facility] if: BG persistently < 70  I reviewed the Rule of 15 for the treatment of hypoglycemia in detail with the patient. Literature supplied.    2) Diabetic complications:  Eye: Does not have known diabetic retinopathy.  Neuro/ Feet: Does  have known diabetic peripheral neuropathy .  Renal: Patient does not have known baseline CKD. He   is  on an ACEI/ARB at present.   3) Peripheral neuropathy :  -Will increase gabapentin as below -Cautioned against drowsiness  Medication  Stop gabapentin at 100 mg Start gabapentin 300 mg, bedtime  F/U in 6 months     Signed electronically by: Lyndle Herrlich, MD  [facility] Endocr[facility]ology  [facility] [address] [place] [place], [place] [address] Phone: [phone] FAX: [phone]   CC: Tower, Audrie Gallus, MD [address] [place] [address] Phone: [phone]  Fax: [phone]  Return to Endocrinology clinic as below: No future appointments.

## 2023-07-11 NOTE — Patient Instructions (Signed)
  Continue Metformin 500 mg daily Continue Repaglinide 2 mg 3 times daily before each meal  Continue Jardiance 10 mg daily   Take Gabapentin 300 mg at bedtime    HOW TO TREAT LOW BLOOD SUGARS (Blood sugar LESS THAN 70 MG/DL) Please follow the RULE OF 15 for the treatment of hypoglycemia treatment (when your (blood sugars are less than 70 mg/dL)   STEP 1: Take 15 grams of carbohydrates when your blood sugar is low, which includes:  3-4 GLUCOSE TABS  OR 3-4 OZ OF JUICE OR REGULAR SODA OR ONE TUBE OF GLUCOSE GEL    STEP 2: RECHECK blood sugar in 15 MINUTES STEP 3: If your blood sugar is still low at the 15 minute recheck --> then, go back to STEP 1 and treat AGAIN with another 15 grams of carbohydrates.

## 2023-07-12 ENCOUNTER — Encounter: Payer: Self-pay | Admitting: Internal Medicine

## 2023-07-12 LAB — MICROALBUMIN / CREATININE URINE RATIO
Creatinine, Urine: 54 mg/dL (ref 20–320)
Microalb Creat Ratio: 7 mg/g{creat} (ref <30)
Microalb, Ur: 0.4 mg/dL

## 2023-07-14 ENCOUNTER — Ambulatory Visit: Payer: Medicare HMO

## 2023-07-21 ENCOUNTER — Ambulatory Visit: Payer: Medicare HMO

## 2023-07-27 DIAGNOSIS — C44519 Basal cell carcinoma of skin of other part of trunk: Secondary | ICD-10-CM | POA: Diagnosis not present

## 2023-07-28 ENCOUNTER — Other Ambulatory Visit: Payer: Self-pay

## 2023-07-28 DIAGNOSIS — E1142 Type 2 diabetes mellitus with diabetic polyneuropathy: Secondary | ICD-10-CM

## 2023-07-28 MED ORDER — EMPAGLIFLOZIN 10 MG PO TABS: 10.0000 mg | ORAL_TABLET | Freq: Every day | ORAL | 3 refills | Status: AC

## 2023-08-16 ENCOUNTER — Other Ambulatory Visit: Payer: Self-pay | Admitting: Family Medicine

## 2023-08-16 ENCOUNTER — Other Ambulatory Visit: Payer: Self-pay | Admitting: Internal Medicine

## 2023-08-16 DIAGNOSIS — E1142 Type 2 diabetes mellitus with diabetic polyneuropathy: Secondary | ICD-10-CM

## 2023-08-16 NOTE — Telephone Encounter (Signed)
 Will route to endo, looks like Dr. Lonzo Cloud refills med

## 2023-09-05 DIAGNOSIS — I10 Essential (primary) hypertension: Secondary | ICD-10-CM | POA: Diagnosis not present

## 2023-09-05 DIAGNOSIS — I7782 Antineutrophilic cytoplasmic antibody (ANCA) vasculitis: Secondary | ICD-10-CM | POA: Diagnosis not present

## 2023-09-05 DIAGNOSIS — R768 Other specified abnormal immunological findings in serum: Secondary | ICD-10-CM | POA: Diagnosis not present

## 2023-09-27 ENCOUNTER — Other Ambulatory Visit: Payer: Self-pay | Admitting: Family Medicine

## 2023-10-03 ENCOUNTER — Ambulatory Visit (INDEPENDENT_AMBULATORY_CARE_PROVIDER_SITE_OTHER)

## 2023-10-03 VITALS — BP 110/70 | Ht 70.0 in | Wt 224.0 lb

## 2023-10-03 DIAGNOSIS — Z0000 Encounter for general adult medical examination without abnormal findings: Secondary | ICD-10-CM

## 2023-10-03 DIAGNOSIS — Z2821 Immunization not carried out because of patient refusal: Secondary | ICD-10-CM | POA: Diagnosis not present

## 2023-10-03 DIAGNOSIS — I7782 Antineutrophilic cytoplasmic antibody (ANCA) vasculitis: Secondary | ICD-10-CM | POA: Diagnosis not present

## 2023-10-03 DIAGNOSIS — I10 Essential (primary) hypertension: Secondary | ICD-10-CM | POA: Diagnosis not present

## 2023-10-03 NOTE — Progress Notes (Signed)
 Because this visit was a virtual/telehealth visit,  certain criteria was not obtained, such a blood pressure, CBG if applicable, and timed get up and go. Any medications not marked as "taking" were not mentioned during the medication reconciliation part of the visit. Any vitals not documented were not able to be obtained due to this being a telehealth visit or patient was unable to self-report a recent blood pressure reading due to a lack of equipment at home via telehealth. Vitals that have been documented are verbally provided by the patient.  Subjective:   Ryan Morgan is a 83 y.o. who presents for a Medicare Wellness preventive visit.  Visit Complete: Virtual I connected with  Ryan Morgan on 10/03/23 by a audio enabled telemedicine application and verified that I am speaking with the correct person using two identifiers.  Patient Location: Home  Provider Location: Home Office  I discussed the limitations of evaluation and management by telemedicine. The patient expressed understanding and agreed to proceed.  Vital Signs: Because this visit was a virtual/telehealth visit, some criteria may be missing or patient reported. Any vitals not documented were not able to be obtained and vitals that have been documented are patient reported.  VideoDeclined- This patient declined Librarian, academic. Therefore the visit was completed with audio only.  Persons Participating in Visit: Patient.  AWV Questionnaire: No: Patient Medicare AWV questionnaire was not completed prior to this visit.  Cardiac Risk Factors include: advanced age (>97men, >63 women);hypertension;obesity (BMI >30kg/m2);diabetes mellitus;dyslipidemia     Objective:    Today's Vitals   10/03/23 1025  BP: 110/70  Weight: 224 lb (101.6 kg)  Height: 5\' 10"  (1.778 m)   Body mass index is 32.14 kg/m.     10/03/2023   10:33 AM 05/27/2022    2:35 PM 05/26/2021    1:35 PM 12/03/2019    1:15 PM  09/12/2019    9:30 AM 07/26/2019    1:34 PM 11/23/2018   11:48 AM  Advanced Directives  Does Patient Have a Medical Advance Directive? No No No No No No No  Does patient want to make changes to medical advance directive?   Yes (MAU/Ambulatory/Procedural Areas - Information given)      Would patient like information on creating a medical advance directive? No - Patient declined No - Patient declined  No - Patient declined No - Patient declined No - Patient declined No - Patient declined    Current Medications (verified) Outpatient Encounter Medications as of 10/03/2023  Medication Sig   carvedilol  (COREG ) 6.25 MG tablet Take 1 tablet (6.25 mg total) by mouth 2 (two) times daily with a meal.   cetirizine (ZYRTEC) 10 MG tablet Take 10 mg by mouth 2 (two) times daily.    cholecalciferol (VITAMIN D3) 25 MCG (1000 UNIT) tablet Take 3,000 Units by mouth daily.   clobetasol  cream (TEMOVATE ) 0.05 % Apply 1 application topically 2 (two) times daily. To affected areas   clopidogrel  (PLAVIX ) 75 MG tablet Take 1 tablet (75 mg total) by mouth daily.   diclofenac Sodium (VOLTAREN ARTHRITIS PAIN) 1 % GEL Apply 2 g topically 4 (four) times daily.   empagliflozin  (JARDIANCE ) 10 MG TABS tablet Take 1 tablet by mouth daily.   empagliflozin  (JARDIANCE ) 10 MG TABS tablet Take 1 tablet (10 mg total) by mouth daily before breakfast.   empagliflozin  (JARDIANCE ) 10 MG TABS tablet TAKE 1 TABLET BY MOUTH ONCE DAILY BEFORE BREAKFAST   Evolocumab  (REPATHA  SURECLICK) 140 MG/ML SOAJ Inject  140 mg into the skin every 14 (fourteen) days.   ezetimibe  (ZETIA ) 10 MG tablet Take 1 tablet by mouth once daily   fexofenadine (ALLEGRA) 180 MG tablet Take 180 mg by mouth daily.   fluticasone  (FLONASE ) 50 MCG/ACT nasal spray Place 2 sprays into both nostrils daily.   gabapentin  (NEURONTIN ) 300 MG capsule Take 1 capsule (300 mg total) by mouth at bedtime.   glucose blood (ONETOUCH ULTRA TEST) test strip USE 1 STRIP TO CHECK GLUCOSE IN THE  MORNING AND 1 AT BEDTIME   LANCETS ULTRA THIN 30G MISC by Does not apply route. One Touch   metFORMIN  (GLUCOPHAGE -XR) 500 MG 24 hr tablet Take 1 tablet by mouth once daily with breakfast   Multiple Vitamin (MULTIVITAMIN WITH MINERALS) TABS tablet Take 1 tablet by mouth daily.   Multiple Vitamins-Minerals (AIRBORNE) TBEF Take 1 tablet by mouth daily as needed (for immune system support). Immune Health/Support   nystatin cream (MYCOSTATIN) Apply 1 application topically 2 (two) times daily as needed for dry skin.   repaglinide  (PRANDIN ) 2 MG tablet Take 1 tablet (2 mg total) by mouth 3 (three) times daily before meals.   sacubitril -valsartan  (ENTRESTO ) 49-51 MG Take 1 tablet by mouth 2 (two) times daily. TAKE 1 TABLET BY MOUTH TWICE DAILY   spironolactone  (ALDACTONE ) 25 MG tablet Take 1 tablet (25 mg total) by mouth daily.   tetrahydrozoline 0.05 % ophthalmic solution Place 1-2 drops into both eyes 3 (three) times daily as needed (for dry/irritated eyes).   No facility-administered encounter medications on file as of 10/03/2023.    Allergies (verified) Benicar  hct [olmesartan  medoxomil-hctz], Bystolic  [nebivolol  hcl], Dapsone, Lovaza [omega-3-acid ethyl esters (fish)], Niaspan [niacin er (antihyperlipidemic)], Peanut-containing drug products, and Septra [bactrim]   History: Past Medical History:  Diagnosis Date   Cancer (HCC)    Basal cell ca of nose   Clotting disorder (HCC)    to prevent CVS per pt - stent x2   Coronary artery disease    a. prior LAD stenting 2000; b. LHC 2003 LAD 40, D1 50; c. Lexiscan  02/2017: large defect of severe severity in the basal inferoseptal, basal inferior, mid inferoseptal, mid inferior and apical inferior location, high risk; d. LHC 03/29/17: LM nl, patent LAD stent w/ 40% ISR, D1 100,  OM1 90, RPDA 100 w/ L-R collats, post atrio 80% s/p PCI/DES     Diabetes mellitus    Hyperlipidemia    Hypertension    Ischemic cardiomyopathy    a. TTE 02/2017: EF 30-35%,  diffuse HK, normal LV diastolic function parameters, mild AI/MR, RV systolic function normal, PASP normal   Lung nodule    a. s/p prior thoracotomy   Morbid obesity (HCC)    Neuromuscular disorder (HCC)    nerves lower back - causes numbness in bil feet & ankles   Wegener's granulomatosis 2007   Past Surgical History:  Procedure Laterality Date   BASAL CELL CARCINOMA EXCISION     removed from face x 3   CARDIAC CATHETERIZATION     CARDIAC SURGERY  05/30/1998   stent in heart   COLONOSCOPY     CORONARY ANGIOPLASTY  09/22/1998   s/p stent placement; [facility] 3.0 x 18 mm Ref [id number]   CORONARY STENT INTERVENTION N/A 03/29/2017   Procedure: CORONARY STENT INTERVENTION;  Surgeon: Wenona Hamilton, MD;  Location: [facility] INVASIVE CV LAB;  Service: Cardiovascular;  Laterality: N/A;   LEFT HEART CATH AND CORONARY ANGIOGRAPHY N/A 03/29/2017   Procedure: LEFT HEART CATH AND CORONARY  ANGIOGRAPHY;  Surgeon: Wenona Hamilton, MD;  Location: [facility] INVASIVE CV LAB;  Service: Cardiovascular;  Laterality: N/A;   LUNG SURGERY     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   POLYPECTOMY     ULTRASOUND GUIDANCE FOR VASCULAR ACCESS  03/29/2017   Procedure: Ultrasound Guidance For Vascular Access;  Surgeon: Wenona Hamilton, MD;  Location: [facility] INVASIVE CV LAB;  Service: Cardiovascular;;   Family History  Problem Relation Age of Onset   Cancer Mother        tumor on tonsil   Stroke Maternal Grandfather    Colon cancer Neg Hx    Esophageal cancer Neg Hx    Rectal cancer Neg Hx    Stomach cancer Neg Hx    Social History   Socioeconomic History   Marital status: Married    Spouse name: Not on file   Number of children: Not on file   Years of education: Not on file   Highest education level: Not on file  Occupational History   Not on file  Tobacco Use   Smoking status: Former    Current packs/day: 0.00    Average packs/day: 0.5 packs/day for 20.0 years (10.0 ttl pk-yrs)    Types: Cigarettes     Start date: 05/30/1958    Quit date: 05/30/1978    Years since quitting: 45.3   Smokeless tobacco: Never  Vaping Use   Vaping status: Never Used  Substance and Sexual Activity   Alcohol use: No    Alcohol/week: 0.0 standard drinks of alcohol   Drug use: No   Sexual activity: Not Currently  Other Topics Concern   Not on file  Social History Narrative   Regular exercise: yes   Caffeine use: very little   Social Drivers of Corporate investment banker Strain: Low Risk  (10/03/2023)   Overall Financial Resource Strain (CARDIA)    Difficulty of Paying Living Expenses: Not hard at all  Food Insecurity: No Food Insecurity (10/03/2023)   Hunger Vital Sign    Worried About Running Out of Food in the Last Year: Never true    Ran Out of Food in the Last Year: Never true  Transportation Needs: No Transportation Needs (10/03/2023)   PRAPARE - Administrator, Civil Service (Medical): No    Lack of Transportation (Non-Medical): No  Physical Activity: Inactive (10/03/2023)   Exercise Vital Sign    Days of Exercise per Week: 0 days    Minutes of Exercise per Session: 0 min  Stress: No Stress Concern Present (10/03/2023)   [employer] of Occupational Health - Occupational Stress Questionnaire    Feeling of Stress : Not at all  Social Connections: Moderately Isolated (10/03/2023)   Social Connection and Isolation Panel [NHANES]    Frequency of Communication with Friends and Family: More than three times a week    Frequency of Social Gatherings with Friends and Family: Three times a week    Attends Religious Services: Never    Active Member of Clubs or Organizations: No    Attends Banker Meetings: Never    Marital Status: Married    Tobacco Counseling Counseling given: Not Answered    Clinical Intake:  Pre-visit preparation completed: Yes  Pain : No/denies pain     BMI - recorded: 32.14 Nutritional Status: BMI > 30  Obese Nutritional Risks: Other  (Comment) Diabetes: Yes CBG done?: No Did pt. bring in CBG monitor from home?: No  Lab Results  Component  Value Date   HGBA1C 6.5 (A) 07/11/2023   HGBA1C 6.9 (A) 01/04/2023   HGBA1C 7.3 (A) 07/05/2022     How often do you need to have someone help you when you read instructions, pamphlets, or other written materials from your doctor or pharmacy?: 1 - Never What is the last grade level you completed in school?: some college  Interpreter Needed?: No  Information entered by :: Juliann Ochoa   Activities of Daily Living     10/03/2023   10:32 AM  In your present state of health, do you have any difficulty performing the following activities:  Hearing? 0  Vision? 0  Difficulty concentrating or making decisions? 0  Walking or climbing stairs? 0  Dressing or bathing? 0  Doing errands, shopping? 0  Preparing Food and eating ? N  Using the Toilet? N  In the past six months, have you accidently leaked urine? N  Do you have problems with loss of bowel control? N  Managing your Medications? N  Managing your Finances? N  Housekeeping or managing your Housekeeping? Y  Comment some help    Patient Care Team: Tower, Manley Seeds, MD as PCP - General (Family Medicine) Jerelene Monday, Deadra Everts, MD as Consulting Physician (Cardiology) Isenstein, Arin L, MD as Consulting Physician (Dermatology) Marine Sia, MD as Consulting Physician (Pulmonary Disease) Daron Ellen, [facility] (Pharmacist)  Indicate any recent Medical Services you may have received from other than [facility] providers in the past year (date may be approximate).     Assessment:   This is a routine wellness examination for [place].  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       Depression Screen     10/03/2023   10:35 AM  05/27/2022    2:41 PM 05/26/2021    1:38 PM 12/03/2019    1:16 PM 11/23/2018    9:40 AM 11/20/2017   11:20 AM 11/15/2016   12:47 PM  PHQ 2/9 Scores  PHQ - 2 Score 0 0 0 0 0 0 0  PHQ- 9 Score 0   0 0 0     Fall Risk     10/03/2023   10:33 AM 05/27/2022    2:43 PM 05/26/2021    1:37 PM 12/03/2019    1:15 PM 11/23/2018    9:40 AM  Fall Risk   Falls in the past year? 0 0 0 1 0  Comment    tripped and fell outside   Number falls in past yr: 0 0 0 0   Injury with Fall? 0 0 0 1   Comment    head injury   Risk for fall due to : No Fall Risks No Fall Risks No Fall Risks Medication side effect   Follow up Falls prevention discussed;Falls evaluation completed Falls evaluation completed;Falls prevention discussed Falls prevention discussed Falls evaluation completed;Falls prevention discussed     MEDICARE RISK AT HOME:  Medicare Risk at Home Any stairs in or around the home?: Yes If so, are there any without handrails?: No Home free of loose throw rugs in walkways, pet beds, electrical cords, etc?: Yes Adequate lighting in your home to reduce risk of falls?: Yes Life alert?: No Use of a cane, walker or  w/c?: No Grab bars in the bathroom?: No Shower chair or bench in shower?: No Elevated toilet seat or a handicapped toilet?: Yes  TIMED UP AND GO:  Was the test performed?  No  Cognitive Function: 6CIT completed    12/03/2019    1:18 PM 11/23/2018    9:41 AM 11/20/2017   11:21 AM 11/15/2016   12:47 PM 10/09/2015   11:00 AM  MMSE - Mini Mental State Exam  Orientation to time 5 5 5 5 5   Orientation to Place 5 5 5 5 5   Registration 3 3 3 3 3   Attention/ Calculation 5 0 0 0 0  Recall 3 3 2 3 3   Recall-comments   unable to recall 1 of 3 words    Language- name 2 objects  0 0 0 0  Language- repeat 1 1 1 1 1   Language- follow 3 step command  0 3 3 3   Language- read & follow direction  0 0 0 0  Write a sentence  0 0 0 0  Copy design  0 0 0 0  Total score  17 19 20 20         10/03/2023    10:28 AM 05/27/2022    2:45 PM  6CIT Screen  What Year? 0 points 0 points  What month? 0 points 0 points  What time? 0 points 0 points  Count back from 20 0 points   Months in reverse 0 points 0 points  Repeat phrase 0 points   Total Score 0 points     Immunizations Immunization History  Administered Date(s) Administered   Fluad Quad(high Dose 65+) 03/13/2019, 03/15/2021   Influenza Split 05/16/2011, 02/16/2012   Influenza, High Dose Seasonal PF 02/28/2017   Influenza,inj,Quad PF,6+ Mos 03/05/2013, 03/10/2014, 05/20/2015, 03/30/2016, 05/10/2018   PFIZER(Purple Top)SARS-COV-2 Vaccination 07/12/2019, 08/02/2019, 01/17/2020   Pneumococcal Conjugate-13 06/09/2014   Pneumococcal Polysaccharide-23 07/15/2011   Td 11/29/2017    Screening Tests Health Maintenance  Topic Date Due   Zoster Vaccines- Shingrix (1 of 2) Never done   COVID-19 Vaccine (4 - 2024-25 season) 01/29/2023   INFLUENZA VACCINE  12/29/2023   HEMOGLOBIN A1C  01/08/2024   Diabetic kidney evaluation - eGFR measurement  04/20/2024   OPHTHALMOLOGY EXAM  04/23/2024   Diabetic kidney evaluation - Urine ACR  07/10/2024   FOOT EXAM  07/10/2024   Medicare Annual Wellness (AWV)  10/02/2024   DTaP/Tdap/Td (2 - Tdap) 11/30/2027   Pneumonia Vaccine 27+ Years old  Completed   HPV VACCINES  Aged Out   Meningococcal B Vaccine  Aged Out    Health Maintenance  Health Maintenance Due  Topic Date Due   Zoster Vaccines- Shingrix (1 of 2) Never done   COVID-19 Vaccine (4 - 2024-25 season) 01/29/2023   Health Maintenance Items Addressed:patient states he has not got any vaccines in the last 12 months and declined,  Additional Screening:  Vision Screening: Recommended annual ophthalmology exams for early detection of glaucoma and other disorders of the eye.  Dental Screening: Recommended annual dental exams for proper oral hygiene  Community Resource Referral / Chronic Care Management: CRR required this visit?  No   CCM  required this visit?  No     Plan:     I have personally reviewed and noted the following in the patient's chart:   Medical and social history Use of alcohol, tobacco or illicit drugs  Current medications and supplements including opioid prescriptions. Patient is not currently taking opioid prescriptions. Functional  ability and status Nutritional status Physical activity Advanced directives List of other physicians Hospitalizations, surgeries, and ER visits in previous 12 months Vitals Screenings to include cognitive, depression, and falls Referrals and appointments  In addition, I have reviewed and discussed with patient certain preventive protocols, quality metrics, and best practice recommendations. A written personalized care plan for preventive services as well as general preventive health recommendations were provided to patient.     Freeda Jerry, [place]   10/03/2023   After Visit Summary: (MyChart) Due to this being a telephonic visit, the after visit summary with patients personalized plan was offered to patient via MyChart   Notes: Nothing significant to report at this time.

## 2023-10-03 NOTE — Patient Instructions (Signed)
 Ryan Morgan , Thank you for taking time to come for your Medicare Wellness Visit. I appreciate your ongoing commitment to your health goals. Please review the following plan we discussed and let me know if I can assist you in the future.   Referrals/Orders/Follow-Ups/Clinician Recommendations: Follow up as scheduled   This is a list of the screening recommended for you and due dates:  Health Maintenance  Topic Date Due   Zoster (Shingles) Vaccine (1 of 2) Never done   COVID-19 Vaccine (4 - 2024-25 season) 01/29/2023   Flu Shot  12/29/2023   Hemoglobin A1C  01/08/2024   Yearly kidney function blood test for diabetes  04/20/2024   Eye exam for diabetics  04/23/2024   Yearly kidney health urinalysis for diabetes  07/10/2024   Complete foot exam   07/10/2024   Medicare Annual Wellness Visit  10/02/2024   DTaP/Tdap/Td vaccine (2 - Tdap) 11/30/2027   Pneumonia Vaccine  Completed   HPV Vaccine  Aged Out   Meningitis B Vaccine  Aged Out    Advanced directives: (Declined) Advance directive discussed with you today. Even though you declined this today, please call our office should you change your mind, and we can give you the proper paperwork for you to fill out.  Next Medicare Annual Wellness Visit scheduled for next year: Yes  Have you seen your provider in the last 6 months (3 months if uncontrolled diabetes)? Yes

## 2024-01-04 ENCOUNTER — Other Ambulatory Visit: Payer: Self-pay | Admitting: Internal Medicine

## 2024-01-08 ENCOUNTER — Ambulatory Visit: Payer: Medicare HMO | Admitting: Internal Medicine

## 2024-01-08 ENCOUNTER — Encounter: Payer: Self-pay | Admitting: Internal Medicine

## 2024-01-08 VITALS — BP 104/68 | HR 64 | Ht 70.0 in | Wt 220.6 lb

## 2024-01-08 DIAGNOSIS — Z7984 Long term (current) use of oral hypoglycemic drugs: Secondary | ICD-10-CM

## 2024-01-08 DIAGNOSIS — E1142 Type 2 diabetes mellitus with diabetic polyneuropathy: Secondary | ICD-10-CM

## 2024-01-08 DIAGNOSIS — G63 Polyneuropathy in diseases classified elsewhere: Secondary | ICD-10-CM

## 2024-01-08 DIAGNOSIS — E1159 Type 2 diabetes mellitus with other circulatory complications: Secondary | ICD-10-CM

## 2024-01-08 LAB — POCT GLYCOSYLATED HEMOGLOBIN (HGB A1C): Hemoglobin A1C: 5.8 % — AB (ref 4.0–5.6)

## 2024-01-08 MED ORDER — METFORMIN HCL ER 500 MG PO TB24: 500.0000 mg | ORAL_TABLET | Freq: Every day | ORAL | 1 refills | Status: AC

## 2024-01-08 MED ORDER — EMPAGLIFLOZIN 10 MG PO TABS: 10.0000 mg | ORAL_TABLET | Freq: Every day | ORAL | 3 refills | Status: AC

## 2024-01-08 MED ORDER — REPAGLINIDE 1 MG PO TABS: 1.0000 mg | ORAL_TABLET | Freq: Three times a day (TID) | ORAL | 3 refills | Status: AC

## 2024-01-08 NOTE — Patient Instructions (Addendum)
 Continue Metformin  500 mg daily Decrease Repaglinide  1 mg 3 times daily before each meal  Continue Jardiance  10 mg daily      HOW TO TREAT LOW BLOOD SUGARS (Blood sugar LESS THAN 70 MG/DL) Please follow the RULE OF 15 for the treatment of hypoglycemia treatment (when your (blood sugars are less than 70 mg/dL)   STEP 1: Take 15 grams of carbohydrates when your blood sugar is low, which includes:  3-4 GLUCOSE TABS  OR 3-4 OZ OF JUICE OR REGULAR SODA OR ONE TUBE OF GLUCOSE GEL    STEP 2: RECHECK blood sugar in 15 MINUTES STEP 3: If your blood sugar is still low at the 15 minute recheck --> then, go back to STEP 1 and treat AGAIN with another 15 grams of carbohydrates.

## 2024-01-08 NOTE — Progress Notes (Signed)
 Name: Ryan Morgan  Age/ Sex: 83 y.o., male   MRN/ DOB: [id number], May 10, 1941     PCP: Randeen Laine [name], MD   Reason for Endocrinology Evaluation: Type 2 Diabetes Mellitus  Initial Endocrine Consultative Visit: 06/15/2017    PATIENT IDENTIFIER: [name] is a 83 y.o. male with a past medical history of Dm, HTN, CHF, and dyslipidemia . The patient has followed with Endocrinology clinic since 06/15/2017 for consultative assistance with management of his diabetes.  DIABETIC HISTORY:  Ryan Morgan was diagnosed with DM 1998. His hemoglobin A1c has ranged from 6.7% in 2016, peaking at 8.4% in 2021.  Patient was followed by Dr. Kassie from 2019 until 05/2021  He was started on Jardiance  12/2022 through the pharmacist program using [employer]  SUBJECTIVE:   During the last visit (07/11/2023): A1c 6.9 %   Today (01/08/2024): [name] is here for follow-up on diabetes management.  He is accompanied by his spouse today.  He checks his blood sugars 1 times daily. The patient has not had hypoglycemic episodes since the last clinic visit.  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 He had a follow-up with cardiology for CAD and CHF  He has been using blueprint nutrition for neuropathy, which has helped improve his symptoms Patient has been noted weight loss Denies nausea, vomiting  Denies constipation or diarrhea  Denies UTI's      HOME DIABETES REGIMEN:  Metformin  500 mg daily Repaglinide  2 mg 3 times daily Jardiance  10 mg daily Gabapentin  300 mg at bedtime     Statin: Yes ACE-I/ARB: Yes    METER DOWNLOAD SUMMARY:  86-264 mg/dL    DIABETIC COMPLICATIONS: Microvascular complications:  Neuropathy, retinopathy Denies: CKD Last Eye Exam: Completed 04/24/2023  Macrovascular complications:  CAD Denies:  CVA, PVD   HISTORY:  Past  Medical History:  Past Medical History:  Diagnosis Date   Cancer (HCC)    Basal cell ca of nose   Clotting disorder (HCC)    to prevent CVS per pt - stent x2   Coronary artery disease    a. prior LAD stenting 2000; b. LHC 2003 LAD 40, D1 50; c. Lexiscan  02/2017: large defect of severe severity in the basal inferoseptal, basal inferior, mid inferoseptal, mid inferior and apical inferior location, high risk; d. LHC 03/29/17: LM nl, patent LAD stent w/ 40% ISR, D1 100,  OM1 90, RPDA 100 w/ L-R collats, post atrio 80% s/p PCI/DES     Diabetes mellitus    Hyperlipidemia    Hypertension    Ischemic cardiomyopathy    a. TTE 02/2017: EF 30-35%, diffuse HK, normal LV diastolic function parameters, mild AI/MR, RV systolic function normal, PASP normal   Lung nodule    a. s/p prior thoracotomy   Morbid obesity (HCC)    Neuromuscular disorder (HCC)    nerves lower back - causes numbness in bil feet & ankles   Wegener's granulomatosis 2007   Past Surgical History:  Past Surgical History:  Procedure Laterality Date   BASAL CELL CARCINOMA EXCISION     removed from face x 3   CARDIAC CATHETERIZATION     CARDIAC SURGERY  05/30/1998   stent in heart   COLONOSCOPY     CORONARY ANGIOPLASTY  09/22/1998   s/p stent placement; Tristar 3.0 x 18 mm Ref [id number]   CORONARY STENT INTERVENTION N/A 03/29/2017   Procedure: CORONARY STENT  INTERVENTION;  Surgeon: Darron Deatrice LABOR, MD;  Location: [facility] INVASIVE [facility];  Service: Cardiovascular;  Laterality: N/A;   LEFT HEART CATH AND CORONARY ANGIOGRAPHY N/A 03/29/2017   Procedure: LEFT HEART CATH AND CORONARY ANGIOGRAPHY;  Surgeon: Darron Deatrice LABOR, MD;  Location: [facility] INVASIVE CV LAB;  Service: Cardiovascular;  Laterality: N/A;   LUNG SURGERY     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   POLYPECTOMY     ULTRASOUND GUIDANCE FOR VASCULAR ACCESS  03/29/2017   Procedure: Ultrasound Guidance For Vascular Access;  Surgeon: Darron Deatrice LABOR, MD;  Location: [facility]  INVASIVE CV LAB;  Service: Cardiovascular;;   Social History:  reports that he quit smoking about 45 years ago. His smoking use included cigarettes. He started smoking about 65 years ago. He has a 10 pack-year smoking history. He has never used smokeless tobacco. He reports that he does not drink alcohol and does not use drugs. Family History:  Family History  Problem Relation Age of Onset   Cancer Mother        tumor on tonsil   Stroke Maternal Grandfather    Colon cancer Neg Hx    Esophageal cancer Neg Hx    Rectal cancer Neg Hx    Stomach cancer Neg Hx      HOME MEDICATIONS: Allergies as of 01/08/2024       Reactions   Benicar  Hct [olmesartan  Medoxomil-hctz] Itching   Painful and itchy knots on palms of hands   Bystolic  [nebivolol  Hcl] Other (See Comments)   headache   Dapsone Hives   Lovaza [omega-3-acid Ethyl Esters (fish)] Other (See Comments)   Unknown reaction   Niaspan [niacin Er (antihyperlipidemic)] Other (See Comments)   flushing   Peanut-containing Drug Products    Rash on hands from peanuts   Septra [bactrim] Hives        Medication List        Accurate as of January 08, 2024 10:30 AM. If you have any questions, ask your nurse or doctor.          Airborne Tbef Take 1 tablet by mouth daily as needed (for immune system support). Immune Health/Support   carvedilol  6.25 MG tablet Commonly known as: COREG  Take 1 tablet (6.25 mg total) by mouth 2 (two) times daily with a meal.   cetirizine 10 MG tablet Commonly known as: ZYRTEC Take 10 mg by mouth 2 (two) times daily.   cholecalciferol 25 MCG (1000 UNIT) tablet Commonly known as: VITAMIN D3 Take 3,000 Units by mouth daily.   clobetasol  cream 0.05 % Commonly known as: TEMOVATE  Apply 1 application topically 2 (two) times daily. To affected areas   clopidogrel  75 MG tablet Commonly known as: PLAVIX  Take 1 tablet (75 mg total) by mouth daily.   Entresto  49-51 MG Generic drug:  sacubitril -valsartan  Take 1 tablet by mouth 2 (two) times daily. TAKE 1 TABLET BY MOUTH TWICE DAILY   ezetimibe  10 MG tablet Commonly known as: ZETIA  Take 1 tablet by mouth once daily   fexofenadine 180 MG tablet Commonly known as: ALLEGRA Take 180 mg by mouth daily.   fluticasone  50 MCG/ACT nasal spray Commonly known as: FLONASE  Place 2 sprays into both nostrils daily.   gabapentin  300 MG capsule Commonly known as: NEURONTIN  Take 1 capsule (300 mg total) by mouth at bedtime.   Jardiance  10 MG Tabs tablet Generic drug: empagliflozin  Take 1 tablet by mouth daily.   empagliflozin  10 MG Tabs tablet Commonly known as: Jardiance  Take 1 tablet (10  mg total) by mouth daily before breakfast.   Jardiance  10 MG Tabs tablet Generic drug: empagliflozin  TAKE 1 TABLET BY MOUTH ONCE DAILY BEFORE BREAKFAST   Lancets Ultra Thin 30G Misc by Does not apply route. One Touch   metFORMIN  500 MG 24 hr tablet Commonly known as: GLUCOPHAGE -XR Take 1 tablet by mouth once daily with breakfast   multivitamin with minerals Tabs tablet Take 1 tablet by mouth daily.   nystatin cream Commonly known as: MYCOSTATIN Apply 1 application topically 2 (two) times daily as needed for dry skin.   OneTouch Ultra Test test strip Generic drug: glucose blood USE 1 STRIP TO CHECK GLUCOSE IN THE MORNING AND 1 AT BEDTIME   repaglinide  2 MG tablet Commonly known as: PRANDIN  Take 1 tablet (2 mg total) by mouth 3 (three) times daily before meals.   Repatha  SureClick 140 MG/ML Soaj Generic drug: Evolocumab  Inject 140 mg into the skin every 14 (fourteen) days.   spironolactone  25 MG tablet Commonly known as: ALDACTONE  Take 1 tablet (25 mg total) by mouth daily.   tetrahydrozoline 0.05 % ophthalmic solution Place 1-2 drops into both eyes 3 (three) times daily as needed (for dry/irritated eyes).   Voltaren Arthritis Pain 1 % Gel Generic drug: diclofenac Sodium Apply 2 g topically 4 (four) times daily.          OBJECTIVE:   Vital Signs: BP 104/68 (BP Location: Left Arm, Patient Position: Sitting, Cuff Size: Normal)   Pulse 64   Ht 5' 10 (1.778 m)   Wt 220 lb 9.6 oz (100.1 kg)   SpO2 98%   BMI 31.65 kg/m   Wt Readings from Last 3 Encounters:  01/08/24 220 lb 9.6 oz (100.1 kg)  10/03/23 224 lb (101.6 kg)  07/11/23 224 lb (101.6 kg)     Exam: General: Pt appears well and is in NAD  Lungs: Clear with good BS bilat   Heart: RRR   Extremities: No pretibial edema.   Neuro: MS is good with appropriate affect, pt is alert and Ox3    DM foot exam: 07/11/2023  The skin of the feet is intact without sores or ulcerations. The pedal pulses are 1+ on right and 1+ on left. The sensation is absent to a screening 5.07, 10 gram monofilament bilaterally     DATA REVIEWED:  Lab Results  Component Value Date   HGBA1C 5.8 (A) 01/08/2024   HGBA1C 6.5 (A) 07/11/2023   HGBA1C 6.9 (A) 01/04/2023    Labs @ careeverywhere  10/03/2023 Sodium 138 Potassium 5.2 BUN 35 Creatinine 1.17 GFR 63      Latest Reference Range & Units 07/11/23 12:12  Microalb, Ur mg/dL 0.4  MICROALB/CREAT RATIO <30 mg/g creat 7  Creatinine, Urine 20 - 320 mg/dL 54     Old records , labs and images have been reviewed.    ASSESSMENT / PLAN / RECOMMENDATIONS:   1) Type 2 Diabetes Mellitus, Optimally controlled, With Neuropathic and macrovascular  complications - Most recent A1c of 5.8 %. Goal A1c < 7.5 %.    -A1c is trending down, he attributes this to lifestyle changes -No history of intolerance to metformin  -I discontinued the acarbose  - I did recommend decreasing repaglinide  with an A1c of 5.8% and the risk of hypoglycemia, patient states he is not sure how long he will be on the current diet, but I will decrease repaglinide  - Caution against hypoglycemia  MEDICATIONS: Continue metformin  500 mg daily Continue Jardiance  10 mg daily Decrease repaglinide  1  mg, 1 tablet 3 times daily before  meals  EDUCATION / INSTRUCTIONS: BG monitoring instructions: Patient is instructed to check his blood sugars 1 times a day. Call [facility] if: BG persistently < 70  I reviewed the Rule of 15 for the treatment of hypoglycemia in detail with the patient. Literature supplied.    2) Diabetic complications:  Eye: Does not have known diabetic retinopathy.  Neuro/ Feet: Does  have known diabetic peripheral neuropathy .  Renal: Patient does not have known baseline CKD. He   is  on an ACEI/ARB at present.   3) Peripheral neuropathy :  - I have increased his gabapentin  from 100 mg to 300 mg January, 2025 - Patient attributes improvement of neuropathic symptoms to blue print nutrition  Medication  Continue gabapentin  300 mg, bedtime  F/U in 6 months    I spent 25 minutes preparing to see the patient by review of recent labs, imaging and procedures, obtaining and reviewing separately obtained history, communicating with the patient/family or caregiver, ordering medications, tests or procedures, and documenting clinical information in the EHR including the differential Dx, treatment, and any further evaluation and other management    Signed electronically by: Stefano Redgie Butts, MD  [facility] Endocrinology  [facility] [address] [place] [place], [place] [address] Phone: [phone] FAX: [phone]   CC: Tower, Laine LABOR, MD [address] [place] [address] Phone: [phone]  Fax: [phone]  Return to Endocrinology clinic as below: Future Appointments  Date Time Provider Department Center  10/03/2024 10:10 AM [facility] ANNUAL WELLNESS VISIT 2 [facility]

## 2024-02-06 DIAGNOSIS — I7782 Antineutrophilic cytoplasmic antibody (ANCA) vasculitis: Secondary | ICD-10-CM | POA: Diagnosis not present

## 2024-02-08 DIAGNOSIS — D485 Neoplasm of uncertain behavior of skin: Secondary | ICD-10-CM | POA: Diagnosis not present

## 2024-02-08 DIAGNOSIS — D044 Carcinoma in situ of skin of scalp and neck: Secondary | ICD-10-CM | POA: Diagnosis not present

## 2024-02-08 DIAGNOSIS — D2272 Melanocytic nevi of left lower limb, including hip: Secondary | ICD-10-CM | POA: Diagnosis not present

## 2024-02-08 DIAGNOSIS — Z08 Encounter for follow-up examination after completed treatment for malignant neoplasm: Secondary | ICD-10-CM | POA: Diagnosis not present

## 2024-02-08 DIAGNOSIS — L821 Other seborrheic keratosis: Secondary | ICD-10-CM | POA: Diagnosis not present

## 2024-02-08 DIAGNOSIS — Z85828 Personal history of other malignant neoplasm of skin: Secondary | ICD-10-CM | POA: Diagnosis not present

## 2024-02-08 DIAGNOSIS — D2261 Melanocytic nevi of right upper limb, including shoulder: Secondary | ICD-10-CM | POA: Diagnosis not present

## 2024-02-08 DIAGNOSIS — L570 Actinic keratosis: Secondary | ICD-10-CM | POA: Diagnosis not present

## 2024-02-08 DIAGNOSIS — D2262 Melanocytic nevi of left upper limb, including shoulder: Secondary | ICD-10-CM | POA: Diagnosis not present

## 2024-02-08 DIAGNOSIS — D2271 Melanocytic nevi of right lower limb, including hip: Secondary | ICD-10-CM | POA: Diagnosis not present

## 2024-02-08 DIAGNOSIS — D225 Melanocytic nevi of trunk: Secondary | ICD-10-CM | POA: Diagnosis not present

## 2024-02-08 DIAGNOSIS — L853 Xerosis cutis: Secondary | ICD-10-CM | POA: Diagnosis not present

## 2024-02-29 DIAGNOSIS — D044 Carcinoma in situ of skin of scalp and neck: Secondary | ICD-10-CM | POA: Diagnosis not present

## 2024-03-05 DIAGNOSIS — H6123 Impacted cerumen, bilateral: Secondary | ICD-10-CM | POA: Diagnosis not present

## 2024-03-05 DIAGNOSIS — H903 Sensorineural hearing loss, bilateral: Secondary | ICD-10-CM | POA: Diagnosis not present

## 2024-03-05 DIAGNOSIS — M3130 Wegener's granulomatosis without renal involvement: Secondary | ICD-10-CM | POA: Diagnosis not present

## 2024-03-28 DIAGNOSIS — L814 Other melanin hyperpigmentation: Secondary | ICD-10-CM | POA: Diagnosis not present

## 2024-04-21 NOTE — Progress Notes (Unsigned)
 Date:  04/22/2024   ID:  Alm Ryan Morgan, DOB 08-01-1940, MRN [id number]  Patient Location:  [address] [place] [address]   Provider location:   [facility], [place] office  PCP:  Randeen Laine [name], MD  Cardiologist:  Perla CRIS Nicolas  Chief Complaint  Patient presents with   12 month follow up     Patient denies any cardiac issues.     History of Present Illness:    Ryan Morgan is a 83 y.o. male with  past medical history of DM,  smoking hx,  coronary artery disease,  stent placed to his LAD in 2000,  (3.0 x 18 mm) repeat catheterization in January 2003 with 40% LAD, 50% diagonal disease, history of Wegener's granulomatosis, on chronic steroids diabetes. Prior thoracotomy surgery on the left for lung nodule, chronic pain on the left that is episodic, stable over several years Chronic shortness of breath, sedentary lifestyle, obesity/deconditioning Stent October 2018 for shortness of breath, chest tightness, fatigue Echocardiogram September 12, 2017 Ejection fraction 30 to 35% 2018:Successful angioplasty and drug-eluting stent placement to the ostial right posterior AV groove artery. Ejection fraction 35 to 40% in 2021 who presents for follow up of his CAD and shortness of breath   Last seen in clinic November 2024  Low pressure this Am, did not eat and took his medications, denies orthostasis symptoms in general  In neuropathy program,through chiropractor Restrictive diet, losing weight Stands on nerve plate, helps with symptoms  Wife who presents with him today feels he is having shortness of breath at times No leg swelling, denies weight gain concerning for fluid retention, not on a diuretic No regular exercise program Denies chest pain concerning for angina Prior history of shortness of breath dating back several years  Labs reviewed A1C 5.8 Statin intolerance, myalgia Tolerate Zetia  10 mg daily Cholesterol above goal end of 2024  Chronic back  pain with sciatica  Echo 1/24 Ejection fraction remains mildly depressed 35 to 40%  EKG personally reviewed by myself on todays visit EKG Interpretation Date/Time:  Monday April 22 2024 10:05:17 EST Ventricular Rate:  60 PR Interval:  172 QRS Duration:  104 QT Interval:  436 QTC Calculation: 436 R Axis:   -1  Text Interpretation: Normal sinus rhythm Inferior infarct (cited on or before 21-Apr-2023) Anterior infarct (cited on or before 21-Apr-2023) When compared with ECG of 21-Apr-2023 09:32, No significant change was found Confirmed by Perla Lye [phone]) on 04/22/2024 10:16:38 AM   Other past medical hx March 2020 Did not feel well, may have had some nausea, diaphoresis Got inside, nearsyncope  Felt by the hospital team that he could have had a vagal event given the  prodrome of nausea and sweating Tropon negative x3.  Lab work looks normal, no sense of dehydration He does not remember much of a prodrome of nausea or sweating before feeling dizzy Heart rate 55 and higher in the hospital, into the 60s  Prior CV studies:   The following studies were reviewed today:  Other past medical history reviewed Echocardiogram October 2018 ejection fraction 30 to 35%   Stress test October 2018 prior MI Prior MI, no significant ischemia Ejection fraction 36%   Cardiac catheterization October 2018 Patent LAD stent mild to moderate in-stent restenosis Occluded diagonal #1 Occluded right PDA with left-to-right collaterals Severe disease right posterior AV groove artery with collaterals Ejection fraction 40% Successful angioplasty stent to ostial right posterior AV groove artery Ejection fraction  at that time 40%   Echocardiogram September 12, 2017 Ejection fraction 30 to 35%   Stress test from January 2009 was a treadmill study, ejection fraction estimated at 45%, with medium-sized area of moderate to severe hypoperfusion involving the septal region.  Past Medical History:   Diagnosis Date   Cancer (HCC)    Basal cell ca of nose   Clotting disorder    to prevent CVS per pt - stent x2   Coronary artery disease    a. prior LAD stenting 2000; b. LHC 2003 LAD 40, D1 50; c. Lexiscan  02/2017: large defect of severe severity in the basal inferoseptal, basal inferior, mid inferoseptal, mid inferior and apical inferior location, high risk; d. LHC 03/29/17: LM nl, patent LAD stent w/ 40% ISR, D1 100,  OM1 90, RPDA 100 w/ L-R collats, post atrio 80% s/p PCI/DES     Diabetes mellitus    Hyperlipidemia    Hypertension    Ischemic cardiomyopathy    a. TTE 02/2017: EF 30-35%, diffuse HK, normal LV diastolic function parameters, mild AI/MR, RV systolic function normal, PASP normal   Lung nodule    a. s/p prior thoracotomy   Morbid obesity (HCC)    Neuromuscular disorder (HCC)    nerves lower back - causes numbness in bil feet & ankles   Wegener's granulomatosis 2007   Past Surgical History:  Procedure Laterality Date   BASAL CELL CARCINOMA EXCISION     removed from face x 3   CARDIAC CATHETERIZATION     CARDIAC SURGERY  05/30/1998   stent in heart   COLONOSCOPY     CORONARY ANGIOPLASTY  09/22/1998   s/p stent placement; Tristar 3.0 x 18 mm Ref [id number]   CORONARY STENT INTERVENTION N/A 03/29/2017   Procedure: CORONARY STENT INTERVENTION;  Surgeon: Darron Deatrice LABOR, MD;  [facility]: [facility]AS[facility];  Service: Cardiovascular;  Laterality: N/A;   LEFT HEART CATH AND CORONARY ANGIOGRAPHY N/A 03/29/2017   Procedure: LEFT HEART CATH AND CORONARY ANGIOGRAPHY;  Surgeon: Darron Deatrice LABOR, MD;  Location: [facility]AS[facility];  Service: Cardiovascular;  Laterality: N/A;   LUNG SURGERY     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   POLYPECTOMY     ULTRASOUND GUIDANCE FOR VASCULAR ACCESS  03/29/2017   Procedure: Ultrasound Guidance For Vascular Access;  Surgeon: Darron Deatrice LABOR, MD;  Location: [facility] INVASIVE CV LAB;  Service: Cardiovascular;;     Current Meds  Medication  Sig   cetirizine (ZYRTEC) 10 MG tablet Take 10 mg by mouth 2 (two) times daily.    cholecalciferol (VITAMIN D3) 25 MCG (1000 UNIT) tablet Take 3,000 Units by mouth daily.   clobetasol  cream (TEMOVATE ) 0.05 % Apply 1 application topically 2 (two) times daily. To affected areas   diclofenac Sodium (VOLTAREN ARTHRITIS PAIN) 1 % GEL Apply 2 g topically 4 (four) times daily.   empagliflozin  (JARDIANCE ) 10 MG TABS tablet Take 1 tablet (10 mg total) by mouth daily before breakfast.   fexofenadine (ALLEGRA) 180 MG tablet Take 180 mg by mouth daily.   fluticasone  (FLONASE ) 50 MCG/ACT nasal spray Place 2 sprays into both nostrils daily.   gabapentin  (NEURONTIN ) 300 MG capsule Take 1 capsule (300 mg total) by mouth at bedtime.   glucose blood (ONETOUCH ULTRA TEST) test strip USE 1 STRIP TO CHECK GLUCOSE IN THE MORNING AND 1 AT BEDTIME   LANCETS ULTRA THIN 30G MISC by Does not apply route. One Touch   metFORMIN  (GLUCOPHAGE -XR) 500 MG 24 hr  tablet Take 1 tablet (500 mg total) by mouth daily with breakfast.   Multiple Vitamin (MULTIVITAMIN WITH MINERALS) TABS tablet Take 1 tablet by mouth daily.   nystatin cream (MYCOSTATIN) Apply 1 application topically 2 (two) times daily as needed for dry skin.   repaglinide  (PRANDIN ) 1 MG tablet Take 1 tablet (1 mg total) by mouth 3 (three) times daily before meals.   tetrahydrozoline 0.05 % ophthalmic solution Place 1-2 drops into both eyes 3 (three) times daily as needed (for dry/irritated eyes).   [DISCONTINUED] carvedilol  (COREG ) 6.25 MG tablet Take 1 tablet (6.25 mg total) by mouth 2 (two) times daily with a meal.   [DISCONTINUED] clopidogrel  (PLAVIX ) 75 MG tablet Take 1 tablet (75 mg total) by mouth daily.   [DISCONTINUED] ezetimibe  (ZETIA ) 10 MG tablet Take 1 tablet by mouth once daily   [DISCONTINUED] sacubitril -valsartan  (ENTRESTO ) 49-51 MG Take 1 tablet by mouth 2 (two) times daily. TAKE 1 TABLET BY MOUTH TWICE DAILY   [DISCONTINUED] spironolactone  (ALDACTONE ) 25  MG tablet Take 1 tablet (25 mg total) by mouth daily.     Allergies:   Benicar  hct [olmesartan  medoxomil-hctz], Bystolic  [nebivolol  hcl], Dapsone, Lovaza [omega-3-acid ethyl esters (fish)], Niaspan [niacin er (antihyperlipidemic)], Peanut-containing drug products, and Septra [bactrim]   Social History   Tobacco Use   Smoking status: Former    Current packs/day: 0.00    Average packs/day: 0.5 packs/day for 20.0 years (10.0 ttl pk-yrs)    Types: Cigarettes    Start date: 05/30/1958    Quit date: 05/30/1978    Years since quitting: 45.9   Smokeless tobacco: Never  Vaping Use   Vaping status: Never Used  Substance Use Topics   Alcohol use: No    Alcohol/week: 0.0 standard drinks of alcohol   Drug use: No     Family Hx: The patient's family history includes Cancer in his mother; Stroke in his maternal grandfather. There is no history of Colon cancer, Esophageal cancer, Rectal cancer, or Stomach cancer.  ROS:   Please see the history of present illness.    Review of Systems  Constitutional: Negative.   Respiratory: Negative.    Cardiovascular: Negative.   Gastrointestinal: Negative.   Musculoskeletal:  Positive for back pain.  Neurological: Negative.   Psychiatric/Behavioral: Negative.    All other systems reviewed and are negative.    Labs/Other Tests and Data Reviewed:    Recent Labs: No results found for requested labs within last 365 days.   Recent Lipid Panel Lab Results  Component Value Date/Time   CHOL 169 04/21/2023 10:19 AM   TRIG 202 (H) 04/21/2023 10:19 AM   HDL 36 (L) 04/21/2023 10:19 AM   CHOLHDL 4.7 04/21/2023 10:19 AM   CHOLHDL 3 09/17/2021 12:26 PM   LDLCALC 98 04/21/2023 10:19 AM   LDLDIRECT 100.0 05/17/2017 01:49 PM    Wt Readings from Last 3 Encounters:  04/22/24 228 lb (103.4 kg)  01/08/24 220 lb 9.6 oz (100.1 kg)  10/03/23 224 lb (101.6 kg)     Exam:    Vital Signs: Vital signs may also be detailed in the HPI BP 98/62 (BP Location: Left  Arm, Patient Position: Sitting, Cuff Size: Normal)   Pulse 60   Ht 5' 10 (1.778 m)   Wt 228 lb (103.4 kg)   SpO2 95%   BMI 32.71 kg/m   Constitutional:  oriented to person, place, and time. No distress.  HENT:  Head: Grossly normal Eyes:  no discharge. No scleral icterus.  Neck: No  JVD, no carotid bruits  Cardiovascular: Regular rate and rhythm, no murmurs appreciated Pulmonary/Chest: Clear to auscultation bilaterally, no wheezes or rales Abdominal: Soft.  no distension.  no tenderness.  Musculoskeletal: Normal range of motion Neurological:  normal muscle tone. Coordination normal. No atrophy Skin: Skin warm and dry Psychiatric: normal affect, pleasant   ASSESSMENT & PLAN:   Coronary artery disease of native artery of native heart with stable angina pectoris (HCC) Currently with no symptoms of angina. No further workup at this time. Continue current medication regimen.  Granulomatosis with polyangiitis, unspecified whether renal involvement (HCC) Reports symptoms are stable  Morbid obesity (HCC) We have encouraged continued exercise, careful diet management in an effort to lose weight.  Essential hypertension Blood pressure low on today's visit, took morning medications and has not had anything to eat or drink  Shortness of breath Recommended regular walking program Exercise limited secondary to chronic back pain, leg neuropathy  Ischemic cardiomyopathy Continue Entresto , carvedilol , spironolactone , Jardiance  Ejection fraction 35 to 40%, not on Lasix A1c much improved  Near syncope Denies recent near-syncope or syncope episodes  Hyperlipidemia Tolerating Zetia , statin myalgias with Lipitor and Crestor  Recommend repeat lipid panel today May need to consider Repatha  with Zetia  to achieve goal LDL less than 55  Carotid bruit on right No significant stenosis bilaterally  Signed, Evalene Lunger, MD  04/22/2024 11:03 AM    [facility]  [facility] [address], [place], [place] [address]

## 2024-04-22 ENCOUNTER — Ambulatory Visit: Attending: Cardiovascular Disease | Admitting: Cardiovascular Disease

## 2024-04-22 ENCOUNTER — Encounter: Payer: Self-pay | Admitting: Cardiovascular Disease

## 2024-04-22 VITALS — BP 98/62 | HR 60 | Ht 70.0 in | Wt 228.0 lb

## 2024-04-22 DIAGNOSIS — I10 Essential (primary) hypertension: Secondary | ICD-10-CM | POA: Diagnosis not present

## 2024-04-22 DIAGNOSIS — R0602 Shortness of breath: Secondary | ICD-10-CM

## 2024-04-22 DIAGNOSIS — M791 Myalgia, unspecified site: Secondary | ICD-10-CM | POA: Diagnosis not present

## 2024-04-22 DIAGNOSIS — I255 Ischemic cardiomyopathy: Secondary | ICD-10-CM | POA: Diagnosis not present

## 2024-04-22 DIAGNOSIS — I25118 Atherosclerotic heart disease of native coronary artery with other forms of angina pectoris: Secondary | ICD-10-CM | POA: Diagnosis not present

## 2024-04-22 DIAGNOSIS — T466X5D Adverse effect of antihyperlipidemic and antiarteriosclerotic drugs, subsequent encounter: Secondary | ICD-10-CM | POA: Diagnosis not present

## 2024-04-22 DIAGNOSIS — E11319 Type 2 diabetes mellitus with unspecified diabetic retinopathy without macular edema: Secondary | ICD-10-CM

## 2024-04-22 DIAGNOSIS — E7800 Pure hypercholesterolemia, unspecified: Secondary | ICD-10-CM

## 2024-04-22 MED ORDER — SACUBITRIL-VALSARTAN 49-51 MG PO TABS: 1.0000 | ORAL_TABLET | Freq: Two times a day (BID) | ORAL | 3 refills | Status: AC

## 2024-04-22 MED ORDER — SPIRONOLACTONE 25 MG PO TABS
25.0000 mg | ORAL_TABLET | Freq: Every day | ORAL | 3 refills | Status: AC
Start: 2024-04-22 — End: ?

## 2024-04-22 MED ORDER — EZETIMIBE 10 MG PO TABS: ORAL_TABLET | ORAL | 3 refills | Status: AC

## 2024-04-22 MED ORDER — CLOPIDOGREL BISULFATE 75 MG PO TABS: 75.0000 mg | ORAL_TABLET | Freq: Every day | ORAL | 3 refills | Status: AC

## 2024-04-22 MED ORDER — CARVEDILOL 6.25 MG PO TABS: 6.2500 mg | ORAL_TABLET | Freq: Two times a day (BID) | ORAL | 3 refills | Status: AC

## 2024-04-22 NOTE — Patient Instructions (Addendum)
 Medication Instructions:  No changes  Call if you would like lasix/furosemide for shortness of breath  If you need a refill on your cardiac medications before your next appointment, please call your pharmacy.   Lab work: Labs today: Lipid Panel  Testing/Procedures: No new testing needed  Follow-Up: At [facility], you and your health needs are our priority.  As part of our continuing mission to provide you with exceptional heart care, we have created designated Provider Care Teams.  These Care Teams include your primary Cardiologist (physician) and Advanced Practice Providers (APPs -  Physician Assistants and Nurse Practitioners) who all work together to provide you with the care you need, when you need it.  You will need a follow up appointment in 12 months  Providers on your designated Care Team:   Ryan Meager, NP Ryan Bring, PA-C Ryan Morgan, [place]  COVID-19 Vaccine Information can be found at: [email or web address] For questions related to vaccine distribution or appointments, please email [email or web address] or call [phone].

## 2024-04-23 LAB — LIPID PANEL
Chol/HDL Ratio: 4.4 ratio (ref 0.0–5.0)
Cholesterol, Total: 162 mg/dL (ref 100–199)
HDL: 37 mg/dL — ABNORMAL LOW (ref >39)
LDL Chol Calc (NIH): 98 mg/dL (ref 0–99)
Triglycerides: 154 mg/dL — ABNORMAL HIGH (ref 0–149)
VLDL Cholesterol Cal: 27 mg/dL (ref 5–40)

## 2024-04-28 ENCOUNTER — Ambulatory Visit: Payer: Self-pay | Admitting: Cardiovascular Disease

## 2024-04-28 DIAGNOSIS — E7800 Pure hypercholesterolemia, unspecified: Secondary | ICD-10-CM

## 2024-04-28 DIAGNOSIS — Z79899 Other long term (current) drug therapy: Secondary | ICD-10-CM

## 2024-05-04 ENCOUNTER — Other Ambulatory Visit: Payer: Self-pay | Admitting: Internal Medicine

## 2024-05-04 DIAGNOSIS — E11311 Type 2 diabetes mellitus with unspecified diabetic retinopathy with macular edema: Secondary | ICD-10-CM

## 2024-05-06 ENCOUNTER — Telehealth: Payer: Self-pay | Admitting: Pharmacist

## 2024-05-06 NOTE — Progress Notes (Signed)
 Brief Telephone Documentation Reason for Call: Medication Access  Summary of Call: Healthwell portal shows Hypercholesterolemia grant has expired as of 04/30/24 Healthwell portal shows Hypercholesterolemia grant has expired as if 12/21/23  Patient reports he is not taking Repatha , re-enrollment is not needed. (No fill hx, never started this).  Confirms he is still taking Entresto  though has continued to pay $0 despite grant expiring in July.   Follow Up: N/A - Advised patient to let us  know in [place] if cost of Entresto  or Jardiance  does go up. Reviewed with him he is likely eligible to re-enroll in the cardiomyopathy grant to cover these costs if needed.   HealthWell ID [id number]  [name], PharmD Clinical Pharmacist [facility] [phone]

## 2024-06-03 ENCOUNTER — Encounter: Payer: Self-pay | Admitting: Podiatry

## 2024-06-03 ENCOUNTER — Ambulatory Visit: Admitting: Podiatry

## 2024-06-03 DIAGNOSIS — L03031 Cellulitis of right toe: Secondary | ICD-10-CM

## 2024-06-03 MED ORDER — MUPIROCIN 2 % EX OINT: 1.0000 | TOPICAL_OINTMENT | Freq: Two times a day (BID) | CUTANEOUS | 1 refills | Status: AC

## 2024-06-03 MED ORDER — DOXYCYCLINE HYCLATE 100 MG PO TABS: 100.0000 mg | ORAL_TABLET | Freq: Two times a day (BID) | ORAL | 0 refills | Status: AC

## 2024-06-03 NOTE — Patient Instructions (Signed)

## 2024-06-04 ENCOUNTER — Telehealth: Payer: Self-pay | Admitting: Podiatry

## 2024-06-04 NOTE — Progress Notes (Signed)
 "  Chief Complaint  Patient presents with   Diabetes    diabetic inflamed red, very painfully and swelling effecting gait, 1st toe R foot  Possible ingrown nail is fungal and dead     HPI: 84 y.o. malepresenting for redness with inflammation to the right hallux nail plate ongoing for few weeks now  Past Medical History:  Diagnosis Date   Cancer (HCC)    Basal cell ca of nose   Clotting disorder    to prevent CVS per pt - stent x2   Coronary artery disease    a. prior LAD stenting 2000; b. LHC 2003 LAD 40, D1 50; c. Lexiscan  02/2017: large defect of severe severity in the basal inferoseptal, basal inferior, mid inferoseptal, mid inferior and apical inferior location, high risk; d. LHC 03/29/17: LM nl, patent LAD stent w/ 40% ISR, D1 100,  OM1 90, RPDA 100 w/ L-R collats, post atrio 80% s/p PCI/DES     Diabetes mellitus    Hyperlipidemia    Hypertension    Ischemic cardiomyopathy    a. TTE 02/2017: EF 30-35%, diffuse HK, normal LV diastolic function parameters, mild AI/MR, RV systolic function normal, PASP normal   Lung nodule    a. s/p prior thoracotomy   Morbid obesity (HCC)    Neuromuscular disorder (HCC)    nerves lower back - causes numbness in bil feet & ankles   Wegener's granulomatosis 2007    Past Surgical History:  Procedure Laterality Date   BASAL CELL CARCINOMA EXCISION     removed from face x 3   CARDIAC CATHETERIZATION     CARDIAC SURGERY  05/30/1998   stent in heart   COLONOSCOPY     CORONARY ANGIOPLASTY  09/22/1998   s/p stent placement; Tristar 3.0 x 18 mm Ref [id number]   CORONARY STENT INTERVENTION N/A 03/29/2017   Procedure: CORONARY STENT INTERVENTION;  Surgeon: Darron Deatrice LABOR, MD;  Location: [facility] INVASIVE CV LAB;  Service: Cardiovascular;  Laterality: N/A;   LEFT HEART CATH AND CORONARY ANGIOGRAPHY N/A 03/29/2017   Procedure: LEFT HEART CATH AND CORONARY ANGIOGRAPHY;  Surgeon: Darron Deatrice LABOR, MD;  Location: [facility] INVASIVE CV LAB;  Service:  Cardiovascular;  Laterality: N/A;   LUNG SURGERY     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   POLYPECTOMY     ULTRASOUND GUIDANCE FOR VASCULAR ACCESS  03/29/2017   Procedure: Ultrasound Guidance For Vascular Access;  Surgeon: Darron Deatrice LABOR, MD;  Location: [facility] INVASIVE CV LAB;  Service: Cardiovascular;;    Allergies[1]   Physical Exam: General: The patient is alert and oriented x3 in no acute distress.  Dermatology: Skin is warm, dry and supple bilateral lower extremities.  Loosely adhered toenail with surrounding erythema noted to the right hallux nail plate  Vascular: Palpable pedal pulses bilaterally. Capillary refill within normal limits.  No appreciable edema.  No erythema.  Neurological: Grossly intact via light touch  Musculoskeletal Exam: No pedal deformities noted   Assessment/Plan of Care: 1.  Paronychia with surrounding inflammation and cellulitis right hallux  -Patient evaluated -Decision made today for total temporary nail avulsion of the right hallux nail plate.  I do believe that removing the toenail and allowing a new nail to grow in should resolve the patient's symptoms -Procedure was discussed in detail with the patient.  Patient is amenable to total temporary nail avulsion of the right hallux nail plate -The toe was prepped in aseptic manner and digital block performed using 3 mL of 2% lidocaine  plain.  The nail was avulsed in its entirety and dressings applied.  Post care instructions provided -Prescription for doxycycline  100 mg twice daily x 10 days -Prescription for mupirocin  ointment.  Apply daily under occlusion with a Band-Aid -Return to clinic 3 weeks     Thresa EMERSON Sar, DPM [facility]  Dr. Thresa EMERSON Sar, DPM    [address], [place] [address]                Office [phone]  Fax [phone]        [1]  Allergies Allergen Reactions   Benicar  Hct [Olmesartan   Medoxomil-Hctz] Itching    Painful and itchy knots on palms of hands   Bystolic  [Nebivolol  Hcl] Other (See Comments)    headache   Dapsone Hives   Lovaza [Omega-3-Acid Ethyl Esters (Fish)] Other (See Comments)    Unknown reaction   Niaspan [Niacin Er (Antihyperlipidemic)] Other (See Comments)    flushing   Peanut-Containing Drug Products     Rash on hands from peanuts   Septra [Bactrim] Hives   "

## 2024-06-04 NOTE — Telephone Encounter (Signed)
 Patient's wife called and would like to know if it's common for the patient to have some bleeding after procedure. Please advise.

## 2024-06-18 ENCOUNTER — Telehealth: Payer: Self-pay | Admitting: Family Medicine

## 2024-06-18 ENCOUNTER — Encounter: Payer: Self-pay | Admitting: Podiatry

## 2024-06-18 ENCOUNTER — Ambulatory Visit: Admitting: Podiatry

## 2024-06-18 VITALS — Ht 70.0 in | Wt 228.0 lb

## 2024-06-18 DIAGNOSIS — L03031 Cellulitis of right toe: Secondary | ICD-10-CM

## 2024-06-18 DIAGNOSIS — I255 Ischemic cardiomyopathy: Secondary | ICD-10-CM

## 2024-06-18 DIAGNOSIS — E1142 Type 2 diabetes mellitus with diabetic polyneuropathy: Secondary | ICD-10-CM

## 2024-06-18 NOTE — Telephone Encounter (Signed)
 I put in the pharm D referral Thanks

## 2024-06-18 NOTE — Progress Notes (Signed)
 "  Chief Complaint  Patient presents with   Nail Problem    Pt is here to f/u on right great toenail after having it removed, he states some pain in the beginning, but states no pain now, states he has been keeping the toe cleaned and bandage.    HPI: 84 y.o. male presenting for follow-up evaluation after total temporary nail avulsion of the right hallux nail plate.  Doing well.  He has been applying antibiotic ointment and a Band-Aid daily  Past Medical History:  Diagnosis Date   Cancer (HCC)    Basal cell ca of nose   Clotting disorder    to prevent CVS per pt - stent x2   Coronary artery disease    a. prior LAD stenting 2000; b. LHC 2003 LAD 40, D1 50; c. Lexiscan  02/2017: large defect of severe severity in the basal inferoseptal, basal inferior, mid inferoseptal, mid inferior and apical inferior location, high risk; d. LHC 03/29/17: LM nl, patent LAD stent w/ 40% ISR, D1 100,  OM1 90, RPDA 100 w/ L-R collats, post atrio 80% s/p PCI/DES     Diabetes mellitus    Hyperlipidemia    Hypertension    Ischemic cardiomyopathy    a. TTE 02/2017: EF 30-35%, diffuse HK, normal LV diastolic function parameters, mild AI/MR, RV systolic function normal, PASP normal   Lung nodule    a. s/p prior thoracotomy   Morbid obesity (HCC)    Neuromuscular disorder (HCC)    nerves lower back - causes numbness in bil feet & ankles   Wegener's granulomatosis 2007    Past Surgical History:  Procedure Laterality Date   BASAL CELL CARCINOMA EXCISION     removed from face x 3   CARDIAC CATHETERIZATION     CARDIAC SURGERY  05/30/1998   stent in heart   COLONOSCOPY     CORONARY ANGIOPLASTY  09/22/1998   s/p stent placement; Tristar 3.0 x 18 mm Ref [id number]   CORONARY STENT INTERVENTION N/A 03/29/2017   Procedure: CORONARY STENT INTERVENTION;  Surgeon: Darron Deatrice LABOR, MD;  Location: [facility] INVAS[facility] CV LAB;  Service: Cardiovascular;  Laterality: N/A;   LEFT HEART CATH AND CORONARY ANGIOGRAPHY N/A 03/29/2017    Procedure: LEFT HEART CATH AND CORONARY ANGIOGRAPHY;  Surgeon: Darron Deatrice LABOR, MD;  Location: [facility] INVASIVE CV LAB;  Service: Cardiovascular;  Laterality: N/A;   LUNG SURGERY     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   POLYPECTOMY     ULTRASOUND GUIDANCE FOR VASCULAR ACCESS  03/29/2017   Procedure: Ultrasound Guidance For Vascular Access;  Surgeon: Darron Deatrice LABOR, MD;  Location: [facility] INVASIVE CV LAB;  Service: Cardiovascular;;    Allergies[1]   Physical Exam: The toenail appears to be healing routinely.  There is no indication of infection.  Most of the nailbed has healed with scab over the majority.  Overall well-healing nail avulsion site  Assessment/Plan of Care: 1.  Status post total temporary nail avulsion right hallux nail plate  - Continued antibiotic ointment and a Band-Aid until completely resolved -Recommend good supportive tennis shoes and sneakers -Patient completed the oral antibiotics as prescribed without any complications -Return to clinic PRN     Thresa EMERSON Sar, DPM [facility]  Dr. Thresa EMERSON Sar, DPM    [address]  [place], [place] [address]                Office [phone]  Fax [phone]        [1]  Allergies Allergen Reactions   Benicar  Hct [Olmesartan  Medoxomil-Hctz] Itching    Painful and itchy knots on palms of hands   Bystolic  [Nebivolol  Hcl] Other (See Comments)    headache   Dapsone Hives   Lovaza [Omega-3-Acid Ethyl Esters (Fish)] Other (See Comments)    Unknown reaction   Niaspan [Niacin Er (Antihyperlipidemic)] Other (See Comments)    flushing   Peanut-Containing Drug Products     Rash on hands from peanuts   Septra [Bactrim] Hives   "

## 2024-06-18 NOTE — Telephone Encounter (Signed)
 Per pt's mychart message on her labs it says:  Alm -Entresto  , Jardiance  (these are $184/mo )

## 2024-06-18 NOTE — Telephone Encounter (Signed)
 Received a message from wife about trouble affording medciines  Which ones?   I will place pharm referral Thanks

## 2024-06-21 ENCOUNTER — Telehealth: Payer: Self-pay

## 2024-06-21 NOTE — Progress Notes (Signed)
 Complex Care Management Note  Care Guide Note 06/21/2024 Name: [name] MRN: [id number] DOB: 12-Nov-1940  Ryan Morgan is a 84 y.o. year old male who sees Tower, Laine [name], MD for primary care. I reached out to Ryan Morgan by phone today to offer complex care management services.  Ryan Morgan was given information about Complex Care Management services today including:   The Complex Care Management services include support from the care team which includes your Nurse Care Manager, Clinical Social Worker, or Pharmacist.  The Complex Care Management team is here to help remove barriers to the health concerns and goals most important to you. Complex Care Management services are voluntary, and the patient may decline or stop services at any time by request to their care team member.   Complex Care Management Consent Status: Patient agreed to services and verbal consent obtained.   Follow up plan:  Telephone appointment with complex care management team member scheduled for:  07/03/24 at 1:30 p.m.   Encounter Outcome:  Patient Scheduled  [name]  [facility] VBCI Assistant Direct Dial: [phone]  Fax: [phone]

## 2024-07-01 ENCOUNTER — Ambulatory Visit: Admitting: Pharmacist

## 2024-07-01 NOTE — Progress Notes (Unsigned)
 Patient ID: Ryan Morgan                 DOB: 20-Jun-1940                    MRN: [id number]      HPI: Ryan Morgan is a 84 y.o. male patient referred to lipid clinic by Dr. Gollan. PMH is significant for CAD s/p stent to the LAD in 2000 and ostial right posterior AV in 2018, diabetes, Wegener's granulomatosis on chronic steroids.  Patient with statin intolerance. Previously on Repatha  but didn't remember to give injections and doesn't like injecting himself.   Leqvio  Or nexletol Medicare advantage   Reviewed options for lowering LDL cholesterol, including ezetimibe , PCSK-9 inhibitors, bempedoic acid and inclisiran.  Discussed mechanisms of action, dosing, side effects and potential decreases in LDL cholesterol.  Also reviewed cost information and potential options for patient assistance.   Current Medications: ezetimibe  10mg  daily Intolerances: atorvastatin  20,40mg , rosuvastatin  5.10mg , simvastatin 40mg  Risk Factors: progressive CAD, DM, HTN, age LDL-C goal: <55 per Dr. Perla ApoB goal:   Diet:   Exercise:   Family History:   Social History:   Labs: Lipid Panel     Component Value Date/Time   CHOL 162 04/22/2024 1048   TRIG 154 (H) 04/22/2024 1048   HDL 37 (L) 04/22/2024 1048   CHOLHDL 4.4 04/22/2024 1048   CHOLHDL 3 09/17/2021 1226   VLDL 32.2 09/17/2021 1226   LDLCALC 98 04/22/2024 1048   LDLDIRECT 100.0 05/17/2017 1349   LABVLDL 27 04/22/2024 1048    Past Medical History:  Diagnosis Date   Cancer (HCC)    Basal cell ca of nose   Clotting disorder    to prevent CVS per pt - stent x2   Coronary artery disease    a. prior LAD stenting 2000; b. LHC 2003 LAD 40, D1 50; c. Lexiscan  02/2017: large defect of severe severity in the basal inferoseptal, basal inferior, mid inferoseptal, mid inferior and apical inferior location, high risk; d. LHC 03/29/17: LM nl, patent LAD stent w/ 40% ISR, D1 100,  OM1 90, RPDA 100 w/ L-R collats, post atrio 80% s/p PCI/DES      Diabetes mellitus    Hyperlipidemia    Hypertension    Ischemic cardiomyopathy    a. TTE 02/2017: EF 30-35%, diffuse HK, normal LV diastolic function parameters, mild AI/MR, RV systolic function normal, PASP normal   Lung nodule    a. s/p prior thoracotomy   Morbid obesity (HCC)    Neuromuscular disorder (HCC)    nerves lower back - causes numbness in bil feet & ankles   Wegener's granulomatosis 2007    Medications Ordered Prior to Encounter[1]  Allergies[2]  Assessment/Plan:  1. Hyperlipidemia -  No problem-specific Assessment & Plan notes found for this encounter.    Thank you,  Madai Nuccio D Brentt Fread, Pharm.JONETTA SARAN, CPP [facility] [address] [place], [place] [address]  Phone: [phone]; Fax: [phone]           [1]  Current Outpatient Medications on File Prior to Visit  Medication Sig Dispense Refill   carvedilol  (COREG ) 6.25 MG tablet Take 1 tablet (6.25 mg total) by mouth 2 (two) times daily with a meal. 180 tablet 3   cetirizine (ZYRTEC) 10 MG tablet Take 10 mg by mouth 2 (two) times daily.      cholecalciferol (VITAMIN D3) 25 MCG (1000  UNIT) tablet Take 3,000 Units by mouth daily.     clobetasol  cream (TEMOVATE ) 0.05 % Apply 1 application topically 2 (two) times daily. To affected areas 30 g 1   clopidogrel  (PLAVIX ) 75 MG tablet Take 1 tablet (75 mg total) by mouth daily. 90 tablet 3   diclofenac Sodium (VOLTAREN ARTHRITIS PAIN) 1 % GEL Apply 2 g topically 4 (four) times daily.     doxycycline  (VIBRA -TABS) 100 MG tablet Take 1 tablet (100 mg total) by mouth 2 (two) times daily. 20 tablet 0   empagliflozin  (JARDIANCE ) 10 MG TABS tablet Take 1 tablet (10 mg total) by mouth daily before breakfast. 30 tablet 3   empagliflozin  (JARDIANCE ) 10 MG TABS tablet Take 1 tablet (10 mg total) by mouth daily before breakfast. 90 tablet 3   Evolocumab  (REPATHA  SURECLICK) 140 MG/ML SOAJ Inject 140 mg into the skin  every 14 (fourteen) days. 6 mL 3   ezetimibe  (ZETIA ) 10 MG tablet Take 1 tablet by mouth once daily 90 tablet 3   fexofenadine (ALLEGRA) 180 MG tablet Take 180 mg by mouth daily.     fluticasone  (FLONASE ) 50 MCG/ACT nasal spray Place 2 sprays into both nostrils daily. 16 g 6   gabapentin  (NEURONTIN ) 300 MG capsule Take 1 capsule (300 mg total) by mouth at bedtime. 90 capsule 3   LANCETS ULTRA THIN 30G MISC by Does not apply route. One Touch     metFORMIN  (GLUCOPHAGE -XR) 500 MG 24 hr tablet Take 1 tablet (500 mg total) by mouth daily with breakfast. 90 tablet 1   Multiple Vitamin (MULTIVITAMIN WITH MINERALS) TABS tablet Take 1 tablet by mouth daily.     mupirocin  ointment (BACTROBAN ) 2 % Apply 1 Application topically 2 (two) times daily. 30 g 1   nystatin cream (MYCOSTATIN) Apply 1 application topically 2 (two) times daily as needed for dry skin.     ONETOUCH ULTRA test strip USE 1 STRIP TO CHECK GLUCOSE IN THE MORNING AND 1 AT BEDTIME 200 each 0   repaglinide  (PRANDIN ) 1 MG tablet Take 1 tablet (1 mg total) by mouth 3 (three) times daily before meals. 270 tablet 3   sacubitril -valsartan  (ENTRESTO ) 49-51 MG Take 1 tablet by mouth 2 (two) times daily. TAKE 1 TABLET BY MOUTH TWICE DAILY 180 tablet 3   spironolactone  (ALDACTONE ) 25 MG tablet Take 1 tablet (25 mg total) by mouth daily. 90 tablet 3   tetrahydrozoline 0.05 % ophthalmic solution Place 1-2 drops into both eyes 3 (three) times daily as needed (for dry/irritated eyes).     No current facility-administered medications on file prior to visit.  [2]  Allergies Allergen Reactions   Benicar  Hct [Olmesartan  Medoxomil-Hctz] Itching    Painful and itchy knots on palms of hands   Bystolic  [Nebivolol  Hcl] Other (See Comments)    headache   Dapsone Hives   Lovaza [Omega-3-Acid Ethyl Esters (Fish)] Other (See Comments)    Unknown reaction   Niaspan [Niacin Er (Antihyperlipidemic)] Other (See Comments)    flushing   Peanut-Containing Drug  Products     Rash on hands from peanuts   Septra [Bactrim] Hives

## 2024-07-02 ENCOUNTER — Emergency Department (HOSPITAL_COMMUNITY)

## 2024-07-02 ENCOUNTER — Emergency Department (HOSPITAL_COMMUNITY)
Admission: EM | Admit: 2024-07-02 | Discharge: 2024-07-03 | Disposition: A | Source: Home / Self Care | Attending: Emergency Medicine | Admitting: Emergency Medicine

## 2024-07-02 DIAGNOSIS — S2241XA Multiple fractures of ribs, right side, initial encounter for closed fracture: Secondary | ICD-10-CM

## 2024-07-02 DIAGNOSIS — W19XXXA Unspecified fall, initial encounter: Secondary | ICD-10-CM

## 2024-07-02 MED ORDER — BACITRACIN ZINC 500 UNIT/GM EX OINT
TOPICAL_OINTMENT | Freq: Two times a day (BID) | CUTANEOUS | Status: DC
  Administered 2024-07-03: 1 via TOPICAL
  Filled 2024-07-02: qty 0.9

## 2024-07-02 MED ORDER — FENTANYL CITRATE (PF) 50 MCG/ML IJ SOSY
50.0000 ug | PREFILLED_SYRINGE | Freq: Once | INTRAMUSCULAR | Status: AC
  Administered 2024-07-03: 50 ug via INTRAVENOUS
  Filled 2024-07-02: qty 1

## 2024-07-02 NOTE — ED Triage Notes (Addendum)
 BIB family from home for back pain s/p fall 3d ago. Takes plavix . Alert, NAD, calm, interactive, resps e/u, speaking in clear complete sentences, sitting in w/c.

## 2024-07-03 ENCOUNTER — Emergency Department (HOSPITAL_COMMUNITY)

## 2024-07-03 ENCOUNTER — Other Ambulatory Visit: Admitting: Pharmacist

## 2024-07-03 DIAGNOSIS — I255 Ischemic cardiomyopathy: Secondary | ICD-10-CM

## 2024-07-03 LAB — CBC WITH DIFFERENTIAL/PLATELET
Abs Immature Granulocytes: 0.01 10*3/uL (ref 0.00–0.07)
Basophils Absolute: 0 10*3/uL (ref 0.0–0.1)
Basophils Relative: 1 %
Eosinophils Absolute: 0.1 10*3/uL (ref 0.0–0.5)
Eosinophils Relative: 2 %
HCT: 40.7 % (ref 39.0–52.0)
Hemoglobin: 13.5 g/dL (ref 13.0–17.0)
Immature Granulocytes: 0 %
Lymphocytes Relative: 25 %
Lymphs Abs: 1 10*3/uL (ref 0.7–4.0)
MCH: 31.8 pg (ref 26.0–34.0)
MCHC: 33.2 g/dL (ref 30.0–36.0)
MCV: 95.8 fL (ref 80.0–100.0)
Monocytes Absolute: 0.4 10*3/uL (ref 0.1–1.0)
Monocytes Relative: 9 %
Neutro Abs: 2.6 10*3/uL (ref 1.7–7.7)
Neutrophils Relative %: 63 %
Platelets: 170 10*3/uL (ref 150–400)
RBC: 4.25 MIL/uL (ref 4.22–5.81)
RDW: 13.4 % (ref 11.5–15.5)
WBC: 4.1 10*3/uL (ref 4.0–10.5)
nRBC: 0 % (ref 0.0–0.2)

## 2024-07-03 LAB — COMPREHENSIVE METABOLIC PANEL WITH GFR
ALT: 10 U/L (ref 0–44)
AST: 17 U/L (ref 15–41)
Albumin: 4.1 g/dL (ref 3.5–5.0)
Alkaline Phosphatase: 77 U/L (ref 38–126)
Anion gap: 10 (ref 5–15)
BUN: 24 mg/dL — ABNORMAL HIGH (ref 8–23)
CO2: 26 mmol/L (ref 22–32)
Calcium: 9.1 mg/dL (ref 8.9–10.3)
Chloride: 106 mmol/L (ref 98–111)
Creatinine, Ser: 1.18 mg/dL (ref 0.61–1.24)
GFR, Estimated: >60 mL/min
Glucose, Bld: 92 mg/dL (ref 70–99)
Potassium: 4.4 mmol/L (ref 3.5–5.1)
Sodium: 143 mmol/L (ref 135–145)
Total Bilirubin: 0.6 mg/dL (ref 0.0–1.2)
Total Protein: 6.9 g/dL (ref 6.5–8.1)

## 2024-07-03 MED ORDER — POLYETHYLENE GLYCOL 3350 17 G PO PACK: 17.0000 g | PACK | Freq: Every day | ORAL | 0 refills | Status: AC

## 2024-07-03 MED ORDER — ACETAMINOPHEN 325 MG PO TABS: 650.0000 mg | ORAL_TABLET | Freq: Two times a day (BID) | ORAL | 0 refills | Status: AC

## 2024-07-03 MED ORDER — OXYCODONE-ACETAMINOPHEN 5-325 MG PO TABS
2.0000 | ORAL_TABLET | Freq: Once | ORAL | Status: AC
  Administered 2024-07-03: 2 via ORAL
  Filled 2024-07-03: qty 2

## 2024-07-03 MED ORDER — OXYCODONE-ACETAMINOPHEN 5-325 MG PO TABS: 1.0000 | ORAL_TABLET | Freq: Two times a day (BID) | ORAL | 0 refills | Status: AC | PRN

## 2024-07-03 MED ORDER — POLYETHYLENE GLYCOL 3350 17 G PO PACK: 17.0000 g | PACK | Freq: Every day | ORAL | 0 refills | Status: DC

## 2024-07-03 MED ORDER — DOCUSATE SODIUM 100 MG PO CAPS: 100.0000 mg | ORAL_CAPSULE | Freq: Two times a day (BID) | ORAL | 0 refills | Status: AC

## 2024-07-03 MED ORDER — IOHEXOL 350 MG/ML SOLN
75.0000 mL | Freq: Once | INTRAVENOUS | Status: AC | PRN
  Administered 2024-07-03: 75 mL via INTRAVENOUS

## 2024-07-03 NOTE — Patient Instructions (Signed)
 [name],   It was a pleasure to speak with you today! As we discussed:?   You have been enrolled in the HealthWell Foundation Cardiomyopathy Grant (Heart Failure). This grant helps Medicare patients pay for their heart medications at the pharmacy.  This card should make your  Jardiance  and Entresto  $0 at the pharmacy for the rest of the year   Your HealthWell ID: [id number]  This program works similar to a medication-savings card or insurance card. Your pharmacy will continue to bill your heart medicine through Medicare, then they will bill the HealthWell savings card to cover your copay so you should not have to pay anything.   Your HealthWell savings card is electronic (not a physical card).  Please provide the following details to your pharmacist:   Your HealthWell Savings Card Numbers:              Rx Card: Card No.  [id number] RX BIN:  [id number] PCN:  [id number] Group:  [id number]  Start Date: 06/03/2024 End Date: 06/02/2025  You may respond directly to this message, or leave me a voicemail at [phone] and I will get back to you shortly.   [name], PharmD, BCACP, CPP Clinical Pharmacist Practitioner [facility] at [facility] Ph: [phone]

## 2024-07-03 NOTE — Progress Notes (Signed)
" ° °  07/03/2024 Name: Ryan Morgan MRN: [id number] DOB: March 10, 1941  Subjective  Chief Complaint  Patient presents with   Cardiomyopathy   Medication Access    Care Team: Primary Care Provider: Tower, Laine [name], MD  Reason for visit: ?  Ryan Morgan is a 84 y.o. male who presents today for a telephone visit with the pharmacist due to medication access concerns regarding their Entresto  and Jardiance . ?   Medication Access: ?  Reports that all medications are not affordable.   Prescription drug coverage: YES Payor: AETNA MEDICARE / Plan: AETNA MEDICARE HMO/PPO / Product Type: *No Product type* / .    Current Patient Assistance: N/A.  Previously enrolled in [employer] (cardiomyopathy and HLD grants).   Patient lives in a household of 2 with an estimated combined annual income of 337% [employer]. via [employer].  Medicare LIS Eligible: No , income exceeded  Assessment and Plan:   1. Medication Access HealthWell Foundation Medicare Access [place] - Re-enrollment   Medication(s): All cardiomyopathy medications (Entresto , Jardiance ) Application Status:  Approved for re-enrollment    HealthWell ID: [id number] Fund: Cardiomyopathy - Medicare Access Assistance Type: Co-pay Diagnosis: I25.5 Start Date: 06/03/2024 End Date: 06/02/2025               Rx Card: Card No.  [id number] RX BIN:  [id number] PCN:  [id number] Group:  [id number]   [name], PharmD, BCACP, CPP Clinical Pharmacist Practitioner [facility] at [facility] Ph: [phone] "

## 2024-07-10 ENCOUNTER — Inpatient Hospital Stay: Admitting: Family Medicine

## 2024-07-29 ENCOUNTER — Ambulatory Visit: Admitting: Internal Medicine

## 2024-10-03 ENCOUNTER — Ambulatory Visit

## 2024-10-04 ENCOUNTER — Ambulatory Visit
